# Patient Record
Sex: Male | Born: 1965 | Race: White | Hispanic: No | Smoking: Former smoker
Health system: Southern US, Community
[De-identification: ages and names within clinical notes are randomized; demographics above are authoritative.]

## PROBLEM LIST (undated history)

## (undated) DIAGNOSIS — D571 Sickle-cell disease without crisis: Secondary | ICD-10-CM

## (undated) DIAGNOSIS — K3189 Other diseases of stomach and duodenum: Secondary | ICD-10-CM

## (undated) DIAGNOSIS — J189 Pneumonia, unspecified organism: Secondary | ICD-10-CM

## (undated) DIAGNOSIS — F32A Depression, unspecified: Secondary | ICD-10-CM

## (undated) DIAGNOSIS — C9000 Multiple myeloma not having achieved remission: Secondary | ICD-10-CM

## (undated) DIAGNOSIS — F1020 Alcohol dependence, uncomplicated: Secondary | ICD-10-CM

## (undated) DIAGNOSIS — E119 Type 2 diabetes mellitus without complications: Secondary | ICD-10-CM

## (undated) DIAGNOSIS — N289 Disorder of kidney and ureter, unspecified: Secondary | ICD-10-CM

## (undated) DIAGNOSIS — F329 Major depressive disorder, single episode, unspecified: Secondary | ICD-10-CM

## (undated) DIAGNOSIS — R0600 Dyspnea, unspecified: Secondary | ICD-10-CM

## (undated) DIAGNOSIS — J9690 Respiratory failure, unspecified, unspecified whether with hypoxia or hypercapnia: Secondary | ICD-10-CM

## (undated) DIAGNOSIS — I499 Cardiac arrhythmia, unspecified: Secondary | ICD-10-CM

## (undated) DIAGNOSIS — K631 Perforation of intestine (nontraumatic): Secondary | ICD-10-CM

## (undated) DIAGNOSIS — Z765 Malingerer [conscious simulation]: Secondary | ICD-10-CM

## (undated) DIAGNOSIS — A4902 Methicillin resistant Staphylococcus aureus infection, unspecified site: Secondary | ICD-10-CM

## (undated) DIAGNOSIS — N19 Unspecified kidney failure: Secondary | ICD-10-CM

## (undated) DIAGNOSIS — I10 Essential (primary) hypertension: Secondary | ICD-10-CM

## (undated) DIAGNOSIS — G4730 Sleep apnea, unspecified: Secondary | ICD-10-CM

## (undated) DIAGNOSIS — Z87442 Personal history of urinary calculi: Secondary | ICD-10-CM

## (undated) DIAGNOSIS — I509 Heart failure, unspecified: Secondary | ICD-10-CM

## (undated) HISTORY — PX: ILEOSTOMY: SHX1783

## (undated) HISTORY — PX: HERNIA REPAIR: SHX51

## (undated) HISTORY — PX: CARDIAC SURGERY: SHX584

## (undated) HISTORY — PX: APPENDECTOMY: SHX54

## (undated) HISTORY — PX: ABDOMINAL SURGERY: SHX537

## (undated) HISTORY — PX: PERIPHERALLY INSERTED CENTRAL CATHETER INSERTION: SHX2221

## (undated) HISTORY — DX: Other diseases of stomach and duodenum: K31.89

---

## 2008-07-13 ENCOUNTER — Emergency Department (HOSPITAL_COMMUNITY): Admission: EM | Admit: 2008-07-13 | Discharge: 2008-07-14 | Payer: Self-pay | Admitting: Emergency Medicine

## 2008-09-20 ENCOUNTER — Emergency Department (HOSPITAL_COMMUNITY): Admission: EM | Admit: 2008-09-20 | Discharge: 2008-09-20 | Payer: Self-pay | Admitting: Emergency Medicine

## 2009-07-30 ENCOUNTER — Emergency Department (HOSPITAL_COMMUNITY): Admission: EM | Admit: 2009-07-30 | Discharge: 2009-07-30 | Payer: Self-pay | Admitting: Family Medicine

## 2009-10-31 ENCOUNTER — Emergency Department (HOSPITAL_COMMUNITY): Admission: EM | Admit: 2009-10-31 | Discharge: 2009-10-31 | Payer: Self-pay | Admitting: Emergency Medicine

## 2010-08-14 LAB — URINALYSIS, ROUTINE W REFLEX MICROSCOPIC
Protein, ur: NEGATIVE mg/dL
Urobilinogen, UA: 0.2 mg/dL (ref 0.0–1.0)

## 2010-08-14 LAB — RAPID URINE DRUG SCREEN, HOSP PERFORMED: Barbiturates: NOT DETECTED

## 2010-09-06 LAB — POCT I-STAT, CHEM 8
Calcium, Ion: 1.06 mmol/L — ABNORMAL LOW (ref 1.12–1.32)
Chloride: 110 mEq/L (ref 96–112)
Glucose, Bld: 106 mg/dL — ABNORMAL HIGH (ref 70–99)
HCT: 51 % (ref 39.0–52.0)

## 2010-09-06 LAB — CBC
HCT: 48.7 % (ref 39.0–52.0)
Hemoglobin: 17 g/dL (ref 13.0–17.0)
MCHC: 34.9 g/dL (ref 30.0–36.0)
MCV: 95.5 fL (ref 78.0–100.0)
RBC: 5.1 MIL/uL (ref 4.22–5.81)

## 2010-09-06 LAB — DIFFERENTIAL
Basophils Relative: 0 % (ref 0–1)
Eosinophils Absolute: 0.1 10*3/uL (ref 0.0–0.7)
Monocytes Absolute: 0.9 10*3/uL (ref 0.1–1.0)
Monocytes Relative: 8 % (ref 3–12)

## 2010-09-12 LAB — POCT I-STAT, CHEM 8
BUN: 6 mg/dL (ref 6–23)
Chloride: 104 mEq/L (ref 96–112)
HCT: 43 % (ref 39.0–52.0)
Potassium: 3.3 mEq/L — ABNORMAL LOW (ref 3.5–5.1)

## 2010-09-12 LAB — DIFFERENTIAL
Basophils Absolute: 0 10*3/uL (ref 0.0–0.1)
Lymphocytes Relative: 21 % (ref 12–46)
Lymphs Abs: 1.8 10*3/uL (ref 0.7–4.0)
Neutro Abs: 5.7 10*3/uL (ref 1.7–7.7)

## 2010-09-12 LAB — ETHANOL: Alcohol, Ethyl (B): 344 mg/dL — ABNORMAL HIGH (ref 0–10)

## 2010-09-12 LAB — SALICYLATE LEVEL: Salicylate Lvl: <4 mg/dL (ref 2.8–20.0)

## 2010-09-12 LAB — CBC
HCT: 38.9 % — ABNORMAL LOW (ref 39.0–52.0)
Hemoglobin: 13.9 g/dL (ref 13.0–17.0)
Platelets: 269 10*3/uL (ref 150–400)
RDW: 13.3 % (ref 11.5–15.5)
WBC: 8.4 10*3/uL (ref 4.0–10.5)

## 2010-09-12 LAB — RAPID URINE DRUG SCREEN, HOSP PERFORMED
Amphetamines: NOT DETECTED
Barbiturates: NOT DETECTED
Benzodiazepines: NOT DETECTED
Cocaine: NOT DETECTED
Opiates: NOT DETECTED

## 2010-10-14 ENCOUNTER — Inpatient Hospital Stay (HOSPITAL_COMMUNITY)
Admission: EM | Admit: 2010-10-14 | Discharge: 2010-10-20 | DRG: 392 | Disposition: A | Discharge status: Home / Self Care | Payer: Self-pay | Attending: General Surgery | Admitting: General Surgery

## 2010-10-14 ENCOUNTER — Emergency Department (HOSPITAL_COMMUNITY): Payer: Self-pay

## 2010-10-14 DIAGNOSIS — F3289 Other specified depressive episodes: Secondary | ICD-10-CM | POA: Diagnosis present

## 2010-10-14 DIAGNOSIS — F329 Major depressive disorder, single episode, unspecified: Secondary | ICD-10-CM | POA: Diagnosis present

## 2010-10-14 DIAGNOSIS — F17200 Nicotine dependence, unspecified, uncomplicated: Secondary | ICD-10-CM | POA: Diagnosis present

## 2010-10-14 DIAGNOSIS — J45909 Unspecified asthma, uncomplicated: Secondary | ICD-10-CM | POA: Diagnosis present

## 2010-10-14 DIAGNOSIS — K630 Abscess of intestine: Secondary | ICD-10-CM | POA: Diagnosis present

## 2010-10-14 DIAGNOSIS — F1010 Alcohol abuse, uncomplicated: Secondary | ICD-10-CM | POA: Diagnosis present

## 2010-10-14 DIAGNOSIS — K5732 Diverticulitis of large intestine without perforation or abscess without bleeding: Principal | ICD-10-CM | POA: Diagnosis present

## 2010-10-14 LAB — POCT I-STAT, CHEM 8
BUN: 5 mg/dL — ABNORMAL LOW (ref 6–23)
Calcium, Ion: 1.12 mmol/L (ref 1.12–1.32)
Hemoglobin: 12.9 g/dL — ABNORMAL LOW (ref 13.0–17.0)
TCO2: 27 mmol/L (ref 0–100)

## 2010-10-14 LAB — DIFFERENTIAL
Eosinophils Absolute: 0.1 10*3/uL (ref 0.0–0.7)
Eosinophils Relative: 1 % (ref 0–5)
Lymphs Abs: 1.3 10*3/uL (ref 0.7–4.0)
Monocytes Absolute: 0.9 10*3/uL (ref 0.1–1.0)
Monocytes Relative: 7 % (ref 3–12)

## 2010-10-14 LAB — PROTIME-INR
INR: 1.11 (ref 0.00–1.49)
Prothrombin Time: 14.5 seconds (ref 11.6–15.2)

## 2010-10-14 LAB — CBC
MCH: 33.9 pg (ref 26.0–34.0)
MCHC: 35.8 g/dL (ref 30.0–36.0)
MCV: 94.8 fL (ref 78.0–100.0)
Platelets: 417 10*3/uL — ABNORMAL HIGH (ref 150–400)
RBC: 4.22 MIL/uL (ref 4.22–5.81)
RDW: 12.9 % (ref 11.5–15.5)

## 2010-10-14 LAB — ETHANOL: Alcohol, Ethyl (B): <11 mg/dL — ABNORMAL HIGH (ref 0–10)

## 2010-10-14 LAB — ABO/RH: ABO/RH(D): A POS

## 2010-10-14 MED ORDER — IOHEXOL 300 MG/ML  SOLN
100.0000 mL | Freq: Once | INTRAMUSCULAR | Status: AC | PRN
  Administered 2010-10-14: 100 mL via INTRAVENOUS

## 2010-10-15 LAB — DIFFERENTIAL
Basophils Relative: 1 % (ref 0–1)
Eosinophils Absolute: 0.1 10*3/uL (ref 0.0–0.7)
Lymphs Abs: 1.2 10*3/uL (ref 0.7–4.0)
Neutrophils Relative %: 77 % (ref 43–77)

## 2010-10-15 LAB — CBC
HCT: 37.8 % — ABNORMAL LOW (ref 39.0–52.0)
Hemoglobin: 12.9 g/dL — ABNORMAL LOW (ref 13.0–17.0)
RBC: 3.93 MIL/uL — ABNORMAL LOW (ref 4.22–5.81)
WBC: 8.9 10*3/uL (ref 4.0–10.5)

## 2010-10-15 LAB — BASIC METABOLIC PANEL
CO2: 25 mEq/L (ref 19–32)
Chloride: 103 mEq/L (ref 96–112)
GFR calc non Af Amer: >60 mL/min (ref >60)
Glucose, Bld: 112 mg/dL — ABNORMAL HIGH (ref 70–99)
Potassium: 4.1 mEq/L (ref 3.5–5.1)
Sodium: 137 mEq/L (ref 135–145)

## 2010-10-16 LAB — CBC
HCT: 37.4 % — ABNORMAL LOW (ref 39.0–52.0)
MCV: 95.9 fL (ref 78.0–100.0)
RDW: 13 % (ref 11.5–15.5)
WBC: 7.2 10*3/uL (ref 4.0–10.5)

## 2010-10-16 LAB — DIFFERENTIAL
Eosinophils Relative: 4 % (ref 0–5)
Lymphocytes Relative: 17 % (ref 12–46)
Lymphs Abs: 1.2 10*3/uL (ref 0.7–4.0)
Monocytes Absolute: 0.6 10*3/uL (ref 0.1–1.0)
Monocytes Relative: 9 % (ref 3–12)

## 2010-10-16 LAB — URINALYSIS, ROUTINE W REFLEX MICROSCOPIC
Glucose, UA: NEGATIVE mg/dL
Ketones, ur: 15 mg/dL — AB
pH: 5.5 (ref 5.0–8.0)

## 2010-10-16 LAB — BASIC METABOLIC PANEL
BUN: 7 mg/dL (ref 6–23)
GFR calc non Af Amer: >60 mL/min (ref >60)
Glucose, Bld: 99 mg/dL (ref 70–99)
Potassium: 4 mEq/L (ref 3.5–5.1)

## 2010-10-16 NOTE — H&P (Signed)
  NAMEREDMOND, WHITTLEY               ACCOUNT NO.:  [id number]  MEDICAL RECORD NO.:  [id number]           PATIENT TYPE:  I  LOCATION:  5153                         FACILITY:  [facility]  PHYSICIAN:  Ollen Gross. Vernell Morgans, M.D. DATE OF BIRTH:  [birth year]  DATE OF ADMISSION:  10/14/2010 DATE OF DISCHARGE:                             HISTORY & PHYSICAL   HISTORY OF PRESENT ILLNESS:  Mr. Rinn is a 45 year old white male who presents to the emergency department with left lower quadrant pain for the last 2 weeks.  He says he has been running low grade fevers at home, he has had some off and on diarrhea, his stools have been sort of a blackish color.  He otherwise denies any nausea, vomiting, fevers, chills, chest pain, shortness of breath or dysuria.  PAST MEDICAL HISTORY:  Significant for: 1. Asthma. 2. Depression. 3. Alcohol abuse.  PAST SURGICAL HISTORY:  None.  MEDICATIONS:  None.  ALLERGIES:  None.  SOCIAL HISTORY:  He currently abuses alcohol and smokes tobacco, smoke cigarettes.  FAMILY HISTORY:  Noncontributory.  PHYSICAL EXAMINATION:  VITAL SIGNS:  His temperature is 98.4, pulse 108, blood pressure 120/89. GENERAL:  He is a well-developed, well-nourished white male in no acute distress. SKIN:  Warm and dry.  No jaundice. EYES:  Extraocular muscles intact.  Pupils equal, round and reactive to light.  Sclerae nonicteric. LUNGS:  Clear bilaterally.  No use of accessory or respiratory muscles. HEART:  Regular rate and rhythm with impulse in left chest. ABDOMEN:  Soft, is focally moderately tender in the left lower quadrant with some guarding but the rest of his abdomen is soft and nontender. EXTREMITIES:  No cyanosis, clubbing or edema with good strength in his arms and legs. PSYCHOLOGIC:  He is alert and oriented x3 with no sudden anxiety or depression.  On review of his lab work was significant for a white count of 12.6, a CT scan did show sigmoid diverticulitis with a small  abscess.  We will plan to admit him to the hospital for bowel rest and IV antibiotics.  We will do serial exams on his abdomen.  If he improves, then there is a chance we may be able to get him over this without any kind of surgical intervention.  If he does not improve, then he may need surgery during this hospital stay. He is in agreement with this treatment plan.     Ollen Gross. Vernell Morgans, M.D.     PST/MEDQ  D:  10/14/2010  T:  10/15/2010  Job:  [id number]  Electronically Signed by Chevis Pretty III M.D. on 10/16/2010 01:58:19 PM

## 2010-10-17 LAB — BASIC METABOLIC PANEL
Calcium: 9.2 mg/dL (ref 8.4–10.5)
GFR calc Af Amer: >60 mL/min (ref >60)
GFR calc non Af Amer: >60 mL/min (ref >60)
Glucose, Bld: 103 mg/dL — ABNORMAL HIGH (ref 70–99)
Potassium: 4 mEq/L (ref 3.5–5.1)
Sodium: 138 mEq/L (ref 135–145)

## 2010-10-17 LAB — DIFFERENTIAL
Lymphs Abs: 1.2 10*3/uL (ref 0.7–4.0)
Monocytes Relative: 7 % (ref 3–12)
Neutro Abs: 6.4 10*3/uL (ref 1.7–7.7)
Neutrophils Relative %: 75 % (ref 43–77)

## 2010-10-17 LAB — CBC
HCT: 37.9 % — ABNORMAL LOW (ref 39.0–52.0)
Hemoglobin: 13.3 g/dL (ref 13.0–17.0)
MCHC: 35.1 g/dL (ref 30.0–36.0)
Platelets: 408 10*3/uL — ABNORMAL HIGH (ref 150–400)
RBC: 3.98 MIL/uL — ABNORMAL LOW (ref 4.22–5.81)
RDW: 12.7 % (ref 11.5–15.5)

## 2010-10-18 LAB — CBC
HCT: 39.2 % (ref 39.0–52.0)
Platelets: 446 10*3/uL — ABNORMAL HIGH (ref 150–400)
RBC: 4.12 MIL/uL — ABNORMAL LOW (ref 4.22–5.81)
RDW: 12.8 % (ref 11.5–15.5)
WBC: 8.9 10*3/uL (ref 4.0–10.5)

## 2010-10-18 LAB — BASIC METABOLIC PANEL
Chloride: 107 mEq/L (ref 96–112)
GFR calc non Af Amer: >60 mL/min (ref >60)
Glucose, Bld: 117 mg/dL — ABNORMAL HIGH (ref 70–99)
Potassium: 4.1 mEq/L (ref 3.5–5.1)
Sodium: 140 mEq/L (ref 135–145)

## 2010-10-18 LAB — DIFFERENTIAL
Basophils Absolute: 0 10*3/uL (ref 0.0–0.1)
Eosinophils Relative: 4 % (ref 0–5)
Lymphocytes Relative: 16 % (ref 12–46)
Lymphs Abs: 1.4 10*3/uL (ref 0.7–4.0)
Neutrophils Relative %: 73 % (ref 43–77)

## 2010-10-19 LAB — BASIC METABOLIC PANEL
BUN: 3 mg/dL — ABNORMAL LOW (ref 6–23)
CO2: 24 mEq/L (ref 19–32)
Chloride: 106 mEq/L (ref 96–112)
Glucose, Bld: 89 mg/dL (ref 70–99)
Potassium: 3.8 mEq/L (ref 3.5–5.1)

## 2010-10-19 LAB — CBC
HCT: 38.2 % — ABNORMAL LOW (ref 39.0–52.0)
Hemoglobin: 13.5 g/dL (ref 13.0–17.0)
MCV: 95 fL (ref 78.0–100.0)
RBC: 4.02 MIL/uL — ABNORMAL LOW (ref 4.22–5.81)
WBC: 8.9 10*3/uL (ref 4.0–10.5)

## 2010-10-19 LAB — DIFFERENTIAL
Lymphocytes Relative: 16 % (ref 12–46)
Lymphs Abs: 1.5 10*3/uL (ref 0.7–4.0)
Monocytes Relative: 8 % (ref 3–12)
Neutrophils Relative %: 72 % (ref 43–77)

## 2010-10-20 LAB — CBC
MCH: 33.4 pg (ref 26.0–34.0)
MCV: 95.3 fL (ref 78.0–100.0)
Platelets: 426 10*3/uL — ABNORMAL HIGH (ref 150–400)
RBC: 4.22 MIL/uL (ref 4.22–5.81)

## 2010-10-20 LAB — DIFFERENTIAL
Eosinophils Absolute: 0.4 10*3/uL (ref 0.0–0.7)
Eosinophils Relative: 5 % (ref 0–5)
Lymphs Abs: 1.5 10*3/uL (ref 0.7–4.0)
Monocytes Absolute: 0.9 10*3/uL (ref 0.1–1.0)
Monocytes Relative: 10 % (ref 3–12)
Neutrophils Relative %: 67 % (ref 43–77)

## 2010-10-20 LAB — BASIC METABOLIC PANEL
BUN: 5 mg/dL — ABNORMAL LOW (ref 6–23)
Chloride: 105 mEq/L (ref 96–112)
Creatinine, Ser: 1.01 mg/dL (ref 0.4–1.5)

## 2010-11-24 NOTE — Discharge Summary (Signed)
  Lucas Small, Lucas Small               ACCOUNT NO.:  [id number]  MEDICAL RECORD NO.:  [id number]           PATIENT TYPE:  I  LOCATION:  5153                         FACILITY:  [facility]  PHYSICIAN:  Gabrielle Dare. Janee Morn, M.D.DATE OF BIRTH:  May 24, 1966  DATE OF ADMISSION:  10/14/2010 DATE OF DISCHARGE:  10/20/2010                              DISCHARGE SUMMARY   HISTORY OF PRESENT ILLNESS:  Mr. Thielman is a 45 year old gentleman who presented to the emergency department with left lower quadrant pain x2 weeks' duration.  He has been running low-grade fevers, had on and off diarrhea.  He came into the emergency department and upon review of his workup was found to have elevation of his white count 12.6 and a CT scan showing sigmoid diverticulitis with a very small abscess.  After being evaluated by Dr. Carolynne Edouard, the decision was made to admit the patient for IV antibiotics and inpatient management.  SUMMARY OF HOSPITAL COURSE:  The patient was admitted on Oct 14, 2010, he was started on IV Zosyn, placed at bowel rest.  This went on for a few days.  The patient's white blood cell count normalized within the first 24 hours as he continued on IV antibiotics.  He was gradually started on clear liquid diet and advanced to full liquid diet and subsequently to regular diet.  His pain subsided a little bit each day to the point where he is really having no pain as of today.  He has been tolerating regular diet.  His bowels initially loose, but now more formed had normalized and he feels appropriate for discharge home at present time.  DISCHARGE DIAGNOSIS: 1. Acute sigmoid diverticulitis - improving. 2. Mild depression - untreated  DISCHARGE MEDICATIONS:  The patient will be given prescriptions for Cipro 500 mg twice daily, Flagyl 500 mg 3 times daily and Vicodin 5/500 one to two tablets q.6 h. p.r.n. pain.  He is asked to follow up in our clinic in approximately 10-14 days for further follow-up.  He may  need eventual referral to Gastroenterology for colonoscopy or a barium enema for further workup of his diverticular disease.  We will otherwise see him in the office in approximately 10-14 days.     Brayton El, PA-C   ______________________________ Gabrielle Dare. Janee Morn, M.D.    KB/MEDQ  D:  10/20/2010  T:  10/20/2010  Job:  [id number]  Electronically Signed by Brayton El  on 11/20/2010 02:33:20 PM Electronically Signed by Violeta Gelinas M.D. on 11/24/2010 06:29:38 PM

## 2011-08-22 ENCOUNTER — Encounter (HOSPITAL_COMMUNITY): Payer: Self-pay | Admitting: Emergency Medicine

## 2011-08-22 ENCOUNTER — Emergency Department (HOSPITAL_COMMUNITY)
Admission: EM | Admit: 2011-08-22 | Discharge: 2011-08-23 | Disposition: A | Discharge status: Other Acute Inpatient Hospital | Payer: Self-pay | Attending: Emergency Medicine | Admitting: Emergency Medicine

## 2011-08-22 DIAGNOSIS — F29 Unspecified psychosis not due to a substance or known physiological condition: Secondary | ICD-10-CM | POA: Insufficient documentation

## 2011-08-22 DIAGNOSIS — F3289 Other specified depressive episodes: Secondary | ICD-10-CM | POA: Insufficient documentation

## 2011-08-22 DIAGNOSIS — F329 Major depressive disorder, single episode, unspecified: Secondary | ICD-10-CM | POA: Insufficient documentation

## 2011-08-22 DIAGNOSIS — G479 Sleep disorder, unspecified: Secondary | ICD-10-CM | POA: Insufficient documentation

## 2011-08-22 DIAGNOSIS — F17200 Nicotine dependence, unspecified, uncomplicated: Secondary | ICD-10-CM | POA: Insufficient documentation

## 2011-08-22 DIAGNOSIS — F1010 Alcohol abuse, uncomplicated: Secondary | ICD-10-CM | POA: Insufficient documentation

## 2011-08-22 HISTORY — DX: Major depressive disorder, single episode, unspecified: F32.9

## 2011-08-22 HISTORY — DX: Depression, unspecified: F32.A

## 2011-08-22 LAB — CBC
MCH: 35.4 pg — ABNORMAL HIGH (ref 26.0–34.0)
MCV: 98.7 fL (ref 78.0–100.0)
Platelets: 248 10*3/uL (ref 150–400)
RDW: 13.2 % (ref 11.5–15.5)
WBC: 8.2 10*3/uL (ref 4.0–10.5)

## 2011-08-22 LAB — COMPREHENSIVE METABOLIC PANEL
AST: 199 U/L — ABNORMAL HIGH (ref 0–37)
Albumin: 3.9 g/dL (ref 3.5–5.2)
Calcium: 9.2 mg/dL (ref 8.4–10.5)
Chloride: 98 mEq/L (ref 96–112)
Creatinine, Ser: 0.88 mg/dL (ref 0.50–1.35)
Total Protein: 8 g/dL (ref 6.0–8.3)

## 2011-08-22 LAB — RAPID URINE DRUG SCREEN, HOSP PERFORMED: Barbiturates: NOT DETECTED

## 2011-08-22 LAB — DIFFERENTIAL
Basophils Relative: 1 % (ref 0–1)
Lymphs Abs: 2.9 10*3/uL (ref 0.7–4.0)
Monocytes Relative: 8 % (ref 3–12)
Neutro Abs: 3.9 10*3/uL (ref 1.7–7.7)
Neutrophils Relative %: 49 % (ref 43–77)

## 2011-08-22 LAB — ACETAMINOPHEN LEVEL: Acetaminophen (Tylenol), Serum: <15 ug/mL (ref 10–30)

## 2011-08-22 MED ORDER — LORAZEPAM 1 MG PO TABS
1.0000 mg | ORAL_TABLET | Freq: Three times a day (TID) | ORAL | Status: DC | PRN
  Administered 2011-08-22 (×2): 1 mg via ORAL
  Filled 2011-08-22 (×2): qty 1

## 2011-08-22 MED ORDER — IBUPROFEN 600 MG PO TABS: 600.0000 mg | ORAL_TABLET | Freq: Three times a day (TID) | ORAL | Status: DC | PRN

## 2011-08-22 MED ORDER — NICOTINE 21 MG/24HR TD PT24
21.0000 mg | MEDICATED_PATCH | Freq: Every day | TRANSDERMAL | Status: DC
  Administered 2011-08-22: 21 mg via TRANSDERMAL
  Filled 2011-08-22: qty 1

## 2011-08-22 MED ORDER — ACETAMINOPHEN 325 MG PO TABS: 650.0000 mg | ORAL_TABLET | ORAL | Status: DC | PRN

## 2011-08-22 NOTE — ED Notes (Signed)
Pt presenting to ed with c/o needing medical clearance. Pt states he feels hopeless and pt states he is depressed. Pt states he last drank alcohol this am. Pt states "I'm totally lost" pt states he feels helpless.

## 2011-08-22 NOTE — ED Notes (Signed)
Dr. Ignacia Palma in to speak with pt

## 2011-08-22 NOTE — ED Notes (Signed)
SW in to speak with pt.

## 2011-08-22 NOTE — ED Provider Notes (Signed)
7:38 PM Results for orders placed during the hospital encounter of 08/22/11  CBC      Component Value Range   WBC 8.2  4.0 - 10.5 (K/uL)   RBC 4.72  4.22 - 5.81 (MIL/uL)   Hemoglobin 16.7  13.0 - 17.0 (g/dL)   HCT 16.1  09.6 - 04.5 (%)   MCV 98.7  78.0 - 100.0 (fL)   MCH 35.4 (*) 26.0 - 34.0 (pg)   MCHC 35.8  30.0 - 36.0 (g/dL)   RDW 40.9  81.1 - 91.4 (%)   Platelets 248  150 - 400 (K/uL)  COMPREHENSIVE METABOLIC PANEL      Component Value Range   Sodium 136  135 - 145 (mEq/L)   Potassium 3.2 (*) 3.5 - 5.1 (mEq/L)   Chloride 98  96 - 112 (mEq/L)   CO2 23  19 - 32 (mEq/L)   Glucose, Bld 126 (*) 70 - 99 (mg/dL)   BUN 5 (*) 6 - 23 (mg/dL)   Creatinine, Ser 7.82  0.50 - 1.35 (mg/dL)   Calcium 9.2  8.4 - 95.6 (mg/dL)   Total Protein 8.0  6.0 - 8.3 (g/dL)   Albumin 3.9  3.5 - 5.2 (g/dL)   AST 213 (*) 0 - 37 (U/L)   ALT 180 (*) 0 - 53 (U/L)   Alkaline Phosphatase 116  39 - 117 (U/L)   Total Bilirubin 0.5  0.3 - 1.2 (mg/dL)   GFR calc non Af Amer >90  >90 (mL/min)   GFR calc Af Amer >90  >90 (mL/min)  ETHANOL      Component Value Range   Alcohol, Ethyl (B) 335 (*) 0 - 11 (mg/dL)  ACETAMINOPHEN LEVEL      Component Value Range   Acetaminophen (Tylenol), Serum <15.0  10 - 30 (ug/mL)  URINE RAPID DRUG SCREEN (HOSP PERFORMED)      Component Value Range   Opiates POSITIVE (*) NONE DETECTED    Cocaine NONE DETECTED  NONE DETECTED    Benzodiazepines NONE DETECTED  NONE DETECTED    Amphetamines NONE DETECTED  NONE DETECTED    Tetrahydrocannabinol NONE DETECTED  NONE DETECTED    Barbiturates NONE DETECTED  NONE DETECTED   DIFFERENTIAL      Component Value Range   Neutrophils Relative 49  43 - 77 (%)   Neutro Abs 3.9  1.7 - 7.7 (K/uL)   Lymphocytes Relative 37  12 - 46 (%)   Lymphs Abs 2.9  0.7 - 4.0 (K/uL)   Monocytes Relative 8  3 - 12 (%)   Monocytes Absolute 0.6  0.1 - 1.0 (K/uL)   Eosinophils Relative 5  0 - 5 (%)   Eosinophils Absolute 0.4  0.0 - 0.7 (K/uL)   Basophils  Relative 1  0 - 1 (%)   Basophils Absolute 0.1  0.0 - 0.1 (K/uL)   No results found.  Lab tests show ETOH quite high at 335.  He will need time to sober up before [facility] can evaluate him.  UDS was positive for opiates.  He says a "friend" gave him some hydrocodone-acetaminophen.   Carleene Cooper III, MD 08/22/11 [phone]

## 2011-08-22 NOTE — BH Assessment (Signed)
Assessment Note   Lucas Small is an 46 y.o. male. Pt reported to the [facility] with a chief complaint of depression and requesting detox from alcohol. Pt states that he has been drinking heavily for 28 years and has never engaged in detox before. Pt states that his alcohol has increased over over the passed 2 years and changed from beer to liquor. Pt states that he has been drinking 1/2 gallon of liquor (whiskey or vodka) daily for 2 years. Pt's last use was: 08/21/11 in the amount of 6 shots of liquor. Pt's current withdrawal symptoms include: weakness, anxiety, tingling, tremors, hot/cold flashes and sweats. Pt also reports that he took 2 hydrocodone pills (dosage unknown) on 08/21/11 that his friend gave him for pain, though states he does not use the medication normally.   Pt states that he has been depressed for the last 2 weeks due to an argument with his girlfriend. Pt states that it was a verbal argument however the police were called and he was taken to jail for the night because alcohol was involved. Pt states he is confused and depressed because he "does not know where he stands with his girl friend" and if he can continue to live with her. Pt reports that his alcohol use has been increased due to the depression. Pt also reports sexual dysfunctions noted recently, stating that he has been unable to ejaculate during intercourse over the past few weeks. Pt states that he has also noted some "possible hallucinations", stating that at times he "thinks he sees things out of the corner of his eyes."  Pt is currently denying SI/HI/AH. Pt requires inpatient detox services at this time.   Axis I: Alcohol Dependence; Mood Disorder NOS Axis II: Deferred Axis III:  Past Medical History  Diagnosis Date  . Depression    Axis IV: economic problems, other psychosocial or environmental problems and problems with primary support group Axis V: 51-60 moderate symptoms  Past Medical History:  Past Medical  History  Diagnosis Date  . Depression     History reviewed. No pertinent past surgical history.  Family History: No family history on file.  Social History:  reports that he has been smoking Cigarettes.  He has been smoking about 1.5 packs per day. He does not have any smokeless tobacco history on file. He reports that he drinks alcohol. He reports that he does not use illicit drugs.  Additional Social History:    Allergies:  Allergies  Allergen Reactions  . Bee Anaphylaxis    Home Medications:  Medications Prior to Admission  Medication Dose Route Frequency Provider Last Rate Last Dose  . acetaminophen (TYLENOL) tablet 650 mg  650 mg Oral Q4H PRN Cheri Guppy, MD      . ibuprofen (ADVIL,MOTRIN) tablet 600 mg  600 mg Oral Q8H PRN Cheri Guppy, MD      . LORazepam (ATIVAN) tablet 1 mg  1 mg Oral Q8H PRN Cheri Guppy, MD   1 mg at 08/22/11 2212  . nicotine (NICODERM CQ - dosed in mg/24 hours) patch 21 mg  21 mg Transdermal Daily Cheri Guppy, MD   21 mg at 08/22/11 1205   No current outpatient prescriptions on file as of 08/22/2011.    OB/GYN Status:  No LMP for male patient.  General Assessment Data Location of Assessment: [facility] ED Living Arrangements: Spouse/significant other Can pt return to current living arrangement?: Yes Admission Status: Voluntary Is patient capable of signing voluntary admission?: Yes Transfer from: Acute  Hospital Referral Source: Self/Family/Friend  Education Status Is patient currently in school?: No  Risk to self Suicidal Ideation: No Suicidal Intent: No Is patient at risk for suicide?: No Suicidal Plan?: No Access to Means: No What has been your use of drugs/alcohol within the last 12 months?: ETOH: 1/2 gallon liquor daily for 2 years Previous Attempts/Gestures: No How many times?: 0  Other Self Harm Risks: none reported Triggers for Past Attempts: None known Intentional Self Injurious Behavior: None Family Suicide  History: No Recent stressful life event(s): Job Loss;Conflict (Comment) (lost job 1 1/2 yrs. ago,argument w/ girlfriend 2weeks ago) Persecutory voices/beliefs?: No Depression: Yes Depression Symptoms: Insomnia;Tearfulness;Isolating;Fatigue;Loss of interest in usual pleasures;Feeling worthless/self pity Substance abuse history and/or treatment for substance abuse?: Yes Suicide prevention information given to non-admitted patients: Not applicable  Risk to Others Homicidal Ideation: No Thoughts of Harm to Others: No Current Homicidal Intent: No Current Homicidal Plan: No Access to Homicidal Means: No Identified Victim: none reported History of harm to others?: No Assessment of Violence: None Noted Violent Behavior Description: pt was calm and cooperative during assessment Does patient have access to weapons?: No Criminal Charges Pending?: No Does patient have a court date: No  Psychosis Hallucinations: Visual ("sometimes I see things out of the corner of my eye") Delusions: None noted  Mental Status Report Appear/Hygiene: Disheveled Eye Contact: Fair Motor Activity: Freedom of movement Speech: Logical/coherent Level of Consciousness: Quiet/awake;Alert Mood: Anxious;Depressed Affect: Appropriate to circumstance;Anxious;Depressed Anxiety Level: Moderate Thought Processes: Coherent;Relevant Judgement: Unimpaired Orientation: Person;Place;Time;Situation Obsessive Compulsive Thoughts/Behaviors: None  Cognitive Functioning Concentration: Normal Memory: Recent Intact;Remote Intact IQ: Average Insight: Fair Impulse Control: Poor Appetite: Poor Weight Loss: 0  Weight Gain: 0  Sleep: Decreased Total Hours of Sleep: 0  (no sleep in 2 days) Vegetative Symptoms: None  Prior Inpatient Therapy Prior Inpatient Therapy: No Prior Therapy Dates: none reported Prior Therapy Facilty/Provider(s): none reported Reason for Treatment: n/a  Prior Outpatient Therapy Prior Outpatient  Therapy: No Prior Therapy Dates: none reported Prior Therapy Facilty/Provider(s): none reported Reason for Treatment: n/a                     Additional Information 1:1 In Past 12 Months?: No CIRT Risk: No Elopement Risk: No Does patient have medical clearance?: Yes     Disposition:  Disposition Disposition of Patient: Referred to;Inpatient treatment program [facility]) Type of inpatient treatment program: Adult Patient referred to: Other (Comment) [facility])  On Site Evaluation by:   Reviewed with Physician:     [place] Crane F 08/22/2011 10:20 PM

## 2011-08-22 NOTE — ED Provider Notes (Signed)
History     CSN: [id number]  Arrival date & time 08/22/11  4098   First MD Initiated Contact with Patient 08/22/11 [phone]      Chief Complaint  Patient presents with  . Medical Clearance    (Consider location/radiation/quality/duration/timing/severity/associated sxs/prior treatment) The history is provided by the patient and a friend.   the patient is a 46 year old alcoholic with depression, who presents to the emergency department requesting treatment for alcoholism and depression.  He denies suicidal or homicidal thoughts.  He has no physical symptoms.  He states that he recently broke up with his girlfriend and he is drinking more.  He says that he cannot concentrate and he does not sleep well.  He thinks that he is increasingly confused.  He drinks alcohol, which he did this morning and has had hydrocodone as well.  He denies illicit drug use  Past Medical History  Diagnosis Date  . Depression     History reviewed. No pertinent past surgical history.  No family history on file.  History  Substance Use Topics  . Smoking status: Current Everyday Smoker -- 1.5 packs/day    Types: Cigarettes  . Smokeless tobacco: Not on file  . Alcohol Use: Yes     1/2 gallon of liquor daily      Review of Systems  Neurological: Negative for headaches.  Psychiatric/Behavioral: Positive for confusion, sleep disturbance and dysphoric mood. Negative for suicidal ideas, hallucinations and self-injury.  All other systems reviewed and are negative.    Allergies  Bee  Home Medications  No current outpatient prescriptions on file.  BP 130/91  Pulse 96  Temp(Src) 97.5 F (36.4 C) (Oral)  Resp 20  SpO2 95%  Physical Exam  Vitals reviewed. Constitutional: He is oriented to person, place, and time. He appears well-developed and well-nourished. No distress.  HENT:  Head: Normocephalic and atraumatic.  Eyes: EOM are normal. Pupils are equal, round, and reactive to light.  Neck: Normal  range of motion. Neck supple.  Cardiovascular: Normal rate, regular rhythm and normal heart sounds.   No murmur heard. Pulmonary/Chest: Effort normal and breath sounds normal. No respiratory distress. He has no wheezes. He has no rales.  Abdominal: Soft. Bowel sounds are normal. He exhibits no distension and no mass. There is no tenderness. There is no rebound and no guarding.  Musculoskeletal: Normal range of motion. He exhibits no edema and no tenderness.  Neurological: He is alert and oriented to person, place, and time. No cranial nerve deficit.  Skin: Skin is warm and dry. He is not diaphoretic.  Psychiatric: He has a normal mood and affect. His behavior is normal.    ED Course  Procedures (including critical care time)  Labs Reviewed  CBC - Abnormal; Notable for the following:    MCH 35.4 (*)    All other components within normal limits  COMPREHENSIVE METABOLIC PANEL  ETHANOL  ACETAMINOPHEN LEVEL  URINE RAPID DRUG SCREEN (HOSP PERFORMED)  DIFFERENTIAL   No results found.   No diagnosis found.    MDM  Alcohol abuse        Cheri Guppy, MD 08/24/11 [phone]

## 2011-08-22 NOTE — ED Notes (Signed)
Pt states he is here for alcohol detox and also depression.  Says he has been depressed for some time but is unable to answer any questions.  States that this nurse is "asking all the wrong questions"  Denies SI/HI.

## 2011-08-23 ENCOUNTER — Inpatient Hospital Stay (HOSPITAL_COMMUNITY)
Admission: AD | Admit: 2011-08-23 | Discharge: 2011-08-30 | DRG: 897 | Disposition: A | Discharge status: Home / Self Care | Payer: PRIVATE HEALTH INSURANCE | Source: Ambulatory Visit | Attending: Psychiatry | Admitting: Psychiatry

## 2011-08-23 ENCOUNTER — Encounter (HOSPITAL_COMMUNITY): Payer: Self-pay

## 2011-08-23 DIAGNOSIS — F3289 Other specified depressive episodes: Secondary | ICD-10-CM

## 2011-08-23 DIAGNOSIS — F17200 Nicotine dependence, unspecified, uncomplicated: Secondary | ICD-10-CM

## 2011-08-23 DIAGNOSIS — F1994 Other psychoactive substance use, unspecified with psychoactive substance-induced mood disorder: Secondary | ICD-10-CM

## 2011-08-23 DIAGNOSIS — Z818 Family history of other mental and behavioral disorders: Secondary | ICD-10-CM

## 2011-08-23 DIAGNOSIS — I10 Essential (primary) hypertension: Secondary | ICD-10-CM

## 2011-08-23 DIAGNOSIS — F329 Major depressive disorder, single episode, unspecified: Secondary | ICD-10-CM

## 2011-08-23 DIAGNOSIS — R0683 Snoring: Secondary | ICD-10-CM | POA: Diagnosis present

## 2011-08-23 DIAGNOSIS — F1020 Alcohol dependence, uncomplicated: Principal | ICD-10-CM

## 2011-08-23 DIAGNOSIS — Z91038 Other insect allergy status: Secondary | ICD-10-CM

## 2011-08-23 DIAGNOSIS — Z87892 Personal history of anaphylaxis: Secondary | ICD-10-CM

## 2011-08-23 MED ORDER — CHLORDIAZEPOXIDE HCL 25 MG PO CAPS
25.0000 mg | ORAL_CAPSULE | Freq: Three times a day (TID) | ORAL | Status: AC
  Administered 2011-08-24 (×3): 25 mg via ORAL
  Filled 2011-08-23 (×3): qty 1

## 2011-08-23 MED ORDER — ONDANSETRON 4 MG PO TBDP: 4.0000 mg | ORAL_TABLET | Freq: Four times a day (QID) | ORAL | Status: AC | PRN

## 2011-08-23 MED ORDER — ADULT MULTIVITAMIN W/MINERALS CH
1.0000 | ORAL_TABLET | Freq: Every day | ORAL | Status: DC
  Administered 2011-08-23 – 2011-08-30 (×8): 1 via ORAL
  Filled 2011-08-23 (×9): qty 1

## 2011-08-23 MED ORDER — CHLORDIAZEPOXIDE HCL 25 MG PO CAPS
25.0000 mg | ORAL_CAPSULE | Freq: Four times a day (QID) | ORAL | Status: AC
  Administered 2011-08-23 (×4): 25 mg via ORAL
  Filled 2011-08-23 (×4): qty 1

## 2011-08-23 MED ORDER — HYDROXYZINE HCL 25 MG PO TABS
25.0000 mg | ORAL_TABLET | Freq: Four times a day (QID) | ORAL | Status: AC | PRN
  Administered 2011-08-23: 25 mg via ORAL

## 2011-08-23 MED ORDER — CHLORDIAZEPOXIDE HCL 25 MG PO CAPS: 25.0000 mg | ORAL_CAPSULE | Freq: Four times a day (QID) | ORAL | Status: AC | PRN

## 2011-08-23 MED ORDER — NICOTINE 21 MG/24HR TD PT24
MEDICATED_PATCH | TRANSDERMAL | Status: AC
  Filled 2011-08-23: qty 1

## 2011-08-23 MED ORDER — VITAMIN B-1 100 MG PO TABS
100.0000 mg | ORAL_TABLET | Freq: Every day | ORAL | Status: DC
  Administered 2011-08-24 – 2011-08-30 (×7): 100 mg via ORAL
  Filled 2011-08-23 (×8): qty 1

## 2011-08-23 MED ORDER — CITALOPRAM HYDROBROMIDE 20 MG PO TABS
20.0000 mg | ORAL_TABLET | Freq: Every day | ORAL | Status: DC
  Administered 2011-08-23: 40 mg via ORAL
  Administered 2011-08-24 – 2011-08-30 (×7): 20 mg via ORAL
  Filled 2011-08-23 (×9): qty 1

## 2011-08-23 MED ORDER — POTASSIUM CHLORIDE CRYS ER 20 MEQ PO TBCR
20.0000 meq | EXTENDED_RELEASE_TABLET | Freq: Two times a day (BID) | ORAL | Status: AC
  Administered 2011-08-23 – 2011-08-26 (×6): 20 meq via ORAL
  Filled 2011-08-23 (×7): qty 1

## 2011-08-23 MED ORDER — LISINOPRIL 10 MG PO TABS
10.0000 mg | ORAL_TABLET | Freq: Every day | ORAL | Status: DC
  Administered 2011-08-23 – 2011-08-30 (×8): 10 mg via ORAL
  Filled 2011-08-23 (×8): qty 1
  Filled 2011-08-23: qty 14

## 2011-08-23 MED ORDER — CHLORDIAZEPOXIDE HCL 25 MG PO CAPS
25.0000 mg | ORAL_CAPSULE | ORAL | Status: AC
  Administered 2011-08-25 (×2): 25 mg via ORAL
  Filled 2011-08-23 (×2): qty 1

## 2011-08-23 MED ORDER — LOPERAMIDE HCL 2 MG PO CAPS
2.0000 mg | ORAL_CAPSULE | ORAL | Status: AC | PRN
  Administered 2011-08-23: 4 mg via ORAL
  Filled 2011-08-23: qty 2

## 2011-08-23 MED ORDER — CHLORDIAZEPOXIDE HCL 25 MG PO CAPS
25.0000 mg | ORAL_CAPSULE | Freq: Every day | ORAL | Status: AC
  Administered 2011-08-26: 25 mg via ORAL
  Filled 2011-08-23: qty 1

## 2011-08-23 MED ORDER — THIAMINE HCL 100 MG/ML IJ SOLN
100.0000 mg | Freq: Once | INTRAMUSCULAR | Status: AC
  Administered 2011-08-23: 100 mg via INTRAMUSCULAR

## 2011-08-23 MED ORDER — ALUM & MAG HYDROXIDE-SIMETH 200-200-20 MG/5ML PO SUSP: 30.0000 mL | ORAL | Status: DC | PRN

## 2011-08-23 MED ORDER — CHLORDIAZEPOXIDE HCL 25 MG PO CAPS
25.0000 mg | ORAL_CAPSULE | Freq: Once | ORAL | Status: AC
  Administered 2011-08-23: 25 mg via ORAL
  Filled 2011-08-23: qty 1

## 2011-08-23 MED ORDER — MAGNESIUM HYDROXIDE 400 MG/5ML PO SUSP: 30.0000 mL | Freq: Every day | ORAL | Status: DC | PRN

## 2011-08-23 MED ORDER — TRAZODONE HCL 50 MG PO TABS
50.0000 mg | ORAL_TABLET | Freq: Every day | ORAL | Status: DC
  Administered 2011-08-23 – 2011-08-27 (×5): 50 mg via ORAL
  Filled 2011-08-23 (×7): qty 1

## 2011-08-23 NOTE — BH Assessment (Signed)
Pt has been accepted to [facility] by Dr. Elmon Kirschner to attending Dr. Allena Katz RM [phone]. Support paperwork has been faxed to [facility].       Georgina Quint A 08/23/2011 1:39 AM

## 2011-08-23 NOTE — H&P (Signed)
Psychiatric Admission Assessment Adult  Patient Identification:  Lucas Small  Date of Evaluation:  08/23/2011  Chief Complaint:  Alcohol Dependence; Mood Disorder NOS  History of Present Illness:: This is a 46 year old Caucasian male, admitted to [facility] from the [facility] ED with complaints of increased depression and alcohol abuse/dependency. Patient reports, "I have bad depression. And I noticed that it is getting worse. When I get very depressed, I drink more alcohol. I have been drinking since I was 46 years old. I have also been an alcoholic x 28 years. I used to drink about 12 packs of beer a night. Then I met my girlfriend of 2 years who likes pure liquor. I started to drink with her. So, I went from drinking beer to liquor. I like Vodka and whiskey. I drink a gallon and a pint of pure liquor daily x 2 years. I drink because it makes me feel good and also helps me fall asleep. Sometimes I blackout from drinking too much. Other times, I get the shakes. My drinking has caused me a lot of personal problems and issues. My marriage failed, my wife left me for another guy.  I lost my home and my brand new car. My driver's licence was revoked and I lost my job because I have nothing to drive to work. I have had DUI charges against me, and I have served 60 days in jail because of DWI. I lost my entire life savings because of my alcoholism. Recently, my girlfriend threw me out of the house.  I have not had any substance abuse treatment until now. I have not been in this hospital before either. But I am ready for a change in my life, but I need help doing it. I have had treatment for depression a long time ago. I am not suicidal, homicidal, however, I do hear voices and see stuff moving on the corner of my eyes sometimes, but when I really look hard, there will be nothing there"  Mood Symptoms:  Anhedonia, Guilt, Hopelessness, Mood Swings, Past 2 Weeks, Sadness, Worthlessness,  Depression  Symptoms:  depressed mood, psychomotor agitation, insomnia,  (Hypo) Manic Symptoms:  Irritable Mood,  Anxiety Symptoms:  Excessive Worry,  Psychotic Symptoms:  Hallucinations: Auditory Visual  PTSD Symptoms: Had a traumatic exposure:  None reported  Past Psychiatric History: Diagnosis: Psychoactive Substance-induced organic mood disorder, Alcohol intoxication, alcohol dependency  Hospitalizations: [facility]  Outpatient Care: None reported  Substance Abuse Care: None reported  Self-Mutilation: Patient denies  Suicidal Attempts: Patient denies  Violent Behaviors: Patient denies   Past Medical History:   Past Medical History  Diagnosis Date  . Depression    Loss of Consciousness:  "I get"I get blackouts when I drink too much Seizure History:  "I get the shakes, it can be real bad sometimes" Cardiac History:  Hypertension  Allergies:   Allergies  Allergen Reactions  . Bee Anaphylaxis   PTA Medications: No prescriptions prior to admission    Previous Psychotropic Medications:  Medication/Dose  Hydrocodone 10 mg (I borrow this from my friends.               Substance Abuse History in the last 12 months: Substance Age of 1st Use Last Use Amount Specific Type  Nicotine 21 Prior to hosp 2 packs daily Cigarettes  Alcohol 17 Prior to hosp A gallon and a pint daily Whiskey, Vodka  Cannabis "I stopped smoking weed a long time ago"  Opiates 31 Prior to hosp 2 tablets of hydrocodone Hydrocodone "my girlfriend gave it to me"  Cocaine 45 2 months ago twice Cocaine  Methamphetamines Denies use     LSD Denies use     Ecstasy Denies use     Benzodiazepines Denies use     Caffeine      Inhalants      Others:                         Consequences of Substance Abuse: Medical Consequences:  Liver damage, possible death by overdose,  Legal Consequences:  Arrests, jail times, Loss of driving privileges Family Consequences:  Family discord Blackouts:   Withdrawal Symptoms:    Cramps Diaphoresis Diarrhea Headaches Nausea Tremors  Social History: Current Place of Residence:  [place] of Birth:  [place].  Family Members: "i have a girlfriend"  Marital Status:  Divorced  Children: 1  Sons: 1  Daughters: 0  Relationships:"I have a girlfriend"  Education:  Special educational needs teacher Problems/Performance: "I completed high school"  Religious Beliefs/Practices: None reported  History of Abuse (Emotional/Phsycial/Sexual): None reported  Occupational Experiences: English as a second language teacher History:  None.  Legal History: Hx. DUI, served 60 days in jail for DWI, lost driving privilege.  Hobbies/Interests: None reported  Family History:  Depression: Mother, sister  Mental Status Examination/Evaluation: Objective:  Appearance: Casual  Eye Contact::  Good  Speech:  Clear and Coherent  Volume:  Normal  Mood:  Depressed, Hopeless and Worthless  Affect:  Flat and Tearful  Thought Process:  Coherent and Intact  Orientation:  Full  Thought Content:  Hallucinations: Auditory Visual  Suicidal Thoughts:  No  Homicidal Thoughts:  No  Memory:  Immediate;   Good Recent;   Good Remote;   Good  Judgement:  Poor  Insight:  Fair  Psychomotor Activity:  Normal  Concentration:  Fair  Recall:  Good  Akathisia:  No  Handed:  Right  AIMS (if indicated):     Assets:  Desire for Improvement  Sleep:  Number of Hours: 0.75     Laboratory/X-Ray: None Psychological Evaluation(s)      Assessment:    AXIS I:  Substance Induced Mood Disorder AXIS II:  Deferred AXIS III:   Past Medical History  Diagnosis Date  . Depression    AXIS IV:  economic problems, housing problems, occupational problems, other psychosocial or environmental problems, problems related to legal system/crime, problems related to social environment and problems with primary support group AXIS V:  21-30 behavior considerably influenced by delusions or hallucinations OR  serious impairment in judgment, communication OR inability to function in almost all areas  Treatment Plan/Recommendations: Admit for safety and stabilization.                                                                Review and reinstate any pertinent home medications.  Initiate alcohol withdrawal protocol.                                                                Citalopram 20 mg daily, and K-dur 20 meq bid x 6 doses,                                                                           Lisinopril 10 mg daily.                                                                 Recheck CMP 08/26/11  Treatment Plan Summary: Daily contact with patient to assess and evaluate symptoms and progress in treatment Medication management  Current Medications:  Current Facility-Administered Medications  Medication Dose Route Frequency Provider Last Rate Last Dose  . alum & mag hydroxide-simeth (MAALOX/MYLANTA) 200-200-20 MG/5ML suspension 30 mL  30 mL Oral Q4H PRN Jorje Guild, PA-C      . chlordiazePOXIDE (LIBRIUM) capsule 25 mg  25 mg Oral Q6H PRN Jorje Guild, PA-C      . chlordiazePOXIDE (LIBRIUM) capsule 25 mg  25 mg Oral Once Jorje Guild, PA-C   25 mg at 08/23/11 0429  . chlordiazePOXIDE (LIBRIUM) capsule 25 mg  25 mg Oral QID Jorje Guild, PA-C   25 mg at 08/23/11 0831   Followed by  . chlordiazePOXIDE (LIBRIUM) capsule 25 mg  25 mg Oral TID Jorje Guild, PA-C       Followed by  . chlordiazePOXIDE (LIBRIUM) capsule 25 mg  25 mg Oral BH-qamhs Jorje Guild, PA-C       Followed by  . chlordiazePOXIDE (LIBRIUM) capsule 25 mg  25 mg Oral Daily Jorje Guild, PA-C      . hydrOXYzine (ATARAX/VISTARIL) tablet 25 mg  25 mg Oral Q6H PRN Jorje Guild, PA-C      . loperamide (IMODIUM) capsule 2-4 mg  2-4 mg Oral PRN Jorje Guild, PA-C      . magnesium hydroxide (MILK OF MAGNESIA) suspension 30 mL  30 mL Oral Daily PRN Jorje Guild, PA-C      . mulitivitamin with  minerals tablet 1 tablet  1 tablet Oral Daily Jorje Guild, PA-C   1 tablet at 08/23/11 0831  . nicotine (NICODERM CQ - dosed in mg/24 hours) 21 mg/24hr patch           . ondansetron (ZOFRAN-ODT) disintegrating tablet 4 mg  4 mg Oral Q6H PRN Jorje Guild, PA-C      . thiamine (B-1) injection 100 mg  100 mg Intramuscular Once Jorje Guild, PA-C   100 mg at 08/23/11 0431  . thiamine (VITAMIN B-1) tablet 100 mg  100 mg Oral Daily Jorje Guild, PA-C      . traZODone (DESYREL) tablet 50 mg  50 mg Oral  QHS Jorje Guild, PA-C       Facility-Administered Medications Ordered in Other Encounters  Medication Dose Route Frequency Provider Last Rate Last Dose  . DISCONTD: acetaminophen (TYLENOL) tablet 650 mg  650 mg Oral Q4H PRN Cheri Guppy, MD      . DISCONTD: ibuprofen (ADVIL,MOTRIN) tablet 600 mg  600 mg Oral Q8H PRN Cheri Guppy, MD      . DISCONTD: LORazepam (ATIVAN) tablet 1 mg  1 mg Oral Q8H PRN Cheri Guppy, MD   1 mg at 08/22/11 2212  . DISCONTD: nicotine (NICODERM CQ - dosed in mg/24 hours) patch 21 mg  21 mg Transdermal Daily Cheri Guppy, MD   21 mg at 08/22/11 1205    Observation Level/Precautions:   Q 15 minutes checks for safety  Laboratory:  Reviewed and noted ED lab findings on file.  Psychotherapy:  Group  Medications:  See medication lists.  Routine PRN Medications:  Yes  Consultations:  None indicated at this time  Discharge Concerns:  Safety  Other:     Armandina Stammer I 3/28/201310:28 AM Tressia Miners
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
## 2011-08-23 NOTE — Progress Notes (Signed)
[facility] Group Notes:  (Counselor/Nursing/MHT/Case Management/Adjunct)  08/23/2011 12:40 PM  Type of Therapy:  Group Therapy  Participation Level:  Did Not Attend  Lucas Small 08/23/2011, 12:40 PM

## 2011-08-23 NOTE — H&P (Signed)
Pt seen and evaluated upon admission.  Completed Admission Suicide Risk Assessment.  See orders.  Pt agreeable with plan.  Discussed with team.   

## 2011-08-23 NOTE — Progress Notes (Signed)
Voluntary admission requesting detox.  Gives 28 year history of alcohol abuse, mainly Vodka ans Whiskey.  Occasional use of Oxycodone from friends if no money to purchase Alcohol.  Currently depressed characterized by crying episodes, loss of self esteem, unreliable memory, mild confusion, and pain in both legs and abdominal pain secondary to "diverticulitis."  Sad and dejected mood and affect.  Denies SI, HI, or A/V hallucinations.  CIWA = 8.  Given snack meal and Librium detox protocol begun.

## 2011-08-23 NOTE — Progress Notes (Signed)
Cosigned by Laquan Ludden C Aleina Burgio, LCSWA 3/28/20133:35 PM   

## 2011-08-23 NOTE — Discharge Planning (Signed)
Pt did not attend morning group. Briefly met with pt in room following group. Pt reports that he arrived at the hospital late last night and was very tired. Pt reports that he has been drinking for the past 28 years and this is his first time going through detox. Pt states that he is unemployed and currently lives with friends in the [place] area. Pt states that he would like to go to a 28 day program after d/c.

## 2011-08-23 NOTE — Progress Notes (Signed)
Patient ID: Lucas Small, male   DOB: Jan 29, 1966, 46 y.o.   MRN: [id number] Pt is awake and active on the unit this PM. Pt denies SI/HI and AVH. Pt is attending attending groups and  is cooperative with staff. Pt c/o mild tremors, anxiety and diarrhea. Writer administered medications per orders. Pt also states that he is facing difficult family problems and currently is homeless. Writer provided pt with information on diet management for diverticulitis and offered emotional support. Writer will continue to monitor.

## 2011-08-23 NOTE — Progress Notes (Signed)
Patient ID: Lucas Small, male   DOB: 28-Apr-1966, 46 y.o.   MRN: [id number] He has been in bed sleeping most of day. Up for short periods of time . Has c/o withdrawal symptoms but has not requested and additional medication even when offered.   Depressed  7, hopelessness 8, denies SI thoughts. Says that his attention span was better.Marland Kitchen

## 2011-08-23 NOTE — Progress Notes (Signed)
[facility] Group Notes:  (Counselor/Nursing/MHT/Case Management/Adjunct)  08/23/2011 2:10 PM  Type of Therapy:  1:15PM Group Therapy  Participation Level:  Did Not Attend  Lucas Small 08/23/2011, 2:10 PM

## 2011-08-23 NOTE — Progress Notes (Signed)
Cosigned by Carney Bern, LCSWA 3/28/20133:35 PM

## 2011-08-23 NOTE — Tx Team (Signed)
Initial Interdisciplinary Treatment Plan  PATIENT STRENGTHS: (choose at least two) Average or above average intelligence Communication skills General fund of knowledge  PATIENT STRESSORS: Financial difficulties Health problems Substance abuse   PROBLEM LIST: Problem List/Patient Goals Date to be addressed Date deferred Reason deferred Estimated date of resolution  Depression 08/23/11     Abusive use of substances 08/23/11     Inadequate support system 08/23/11     Lack of self esteem 08/23/11                                    DISCHARGE CRITERIA:  Ability to meet basic life and health needs Adequate post-discharge living arrangements Improved stabilization in mood, thinking, and/or behavior Medical problems require only outpatient monitoring Motivation to continue treatment in a less acute level of care Need for constant or close observation no longer present Verbal commitment to aftercare and medication compliance Withdrawal symptoms are absent or subacute and managed without 24-hour nursing intervention  PRELIMINARY DISCHARGE PLAN: Attend aftercare/continuing care group Attend 12-step recovery group Outpatient therapy Placement in alternative living arrangements  PATIENT/FAMIILY INVOLVEMENT: This treatment plan has been presented to and reviewed with the patient, Lucas Small, and/or family member,   The patient and family have been given the opportunity to ask questions and make suggestions.  Alicia Amel 08/23/2011, 4:47 AM

## 2011-08-24 DIAGNOSIS — F329 Major depressive disorder, single episode, unspecified: Secondary | ICD-10-CM

## 2011-08-24 DIAGNOSIS — F1994 Other psychoactive substance use, unspecified with psychoactive substance-induced mood disorder: Secondary | ICD-10-CM

## 2011-08-24 LAB — COMPREHENSIVE METABOLIC PANEL
ALT: 130 U/L — ABNORMAL HIGH (ref 0–53)
Alkaline Phosphatase: 115 U/L (ref 39–117)
BUN: 12 mg/dL (ref 6–23)
CO2: 29 mEq/L (ref 19–32)
Chloride: 103 mEq/L (ref 96–112)
GFR calc Af Amer: 87 mL/min — ABNORMAL LOW (ref >90)
Glucose, Bld: 100 mg/dL — ABNORMAL HIGH (ref 70–99)
Potassium: 3.6 mEq/L (ref 3.5–5.1)
Sodium: 141 mEq/L (ref 135–145)
Total Bilirubin: 0.6 mg/dL (ref 0.3–1.2)
Total Protein: 8.1 g/dL (ref 6.0–8.3)

## 2011-08-24 MED ORDER — NICOTINE 21 MG/24HR TD PT24
21.0000 mg | MEDICATED_PATCH | Freq: Every day | TRANSDERMAL | Status: DC
  Administered 2011-08-24 – 2011-08-30 (×7): 21 mg via TRANSDERMAL
  Filled 2011-08-24 (×10): qty 1

## 2011-08-24 NOTE — [facility] Suicide Risk Assessment (Signed)
Suicide Risk Assessment  Admission Assessment      Demographic factors:  See chart.  Current Mental Status:  Patient seen and evaluated. Chart reviewed. Patient stated that his mood was "not good". His affect was mood congruent and dysthymic. He denied any current thoughts of self injurious behavior, suicidal ideation or homicidal ideation. There were no auditory or visual hallucinations, paranoia, delusional thought processes, or mania noted.  Thought process was linear and goal directed.  No psychomotor agitation or retardation was noted. His speech was normal rate, tone and volume. Eye contact was good. Judgment and insight are fair.  Patient has been up and engaged on the unit.  No acute safety concerns reported from team.  Loss Factors:  Issues with GF; Divorce; past DUI; unemployed  Historical Factors: No past SA Tx.  Risk Reduction Factors:  Support; open to residential tx s/p detox  CLINICAL FACTORS: Alcohol Dependence & W/D; r/o SIMD  COGNITIVE FEATURES THAT CONTRIBUTE TO RISK: limited insight.  SUICIDE RISK: Pt viewed as a moderate increased risk of harm to self in light of his past hx and risk factors.  No acute safety concerns on the unit.  Pt contracting for safety and in need of crisis stabilization & Tx.  PLAN OF CARE: Pt admitted for crisis stabilization, detox and treatment.  Please see orders.   Medications reviewed with pt and medication education provided.  Started pt on citalopram.  Trazodone for sleep.  Will continue q15 minute checks per unit protocol.  No clinical indication for one on one level of observation at this time.  Pt contracting for safety.  Mental health treatment, medication management and continued sobriety will mitigate against the potential increased risk of harm to self and/or others.  Discussed the importance of recovery with pt, as well as, tools to move forward in a healthy & safe manner.  Pt agreeable with the plan.  Discussed with the team.  Rec  residential Tx s/p detox.  Lupe Carney 08/24/2011, 7:40 AM

## 2011-08-24 NOTE — Progress Notes (Signed)
Pt has been up and has been active while in the milieu today, has been participating in various activities, pt having various signs and symptoms of withdrawal, pt has endorsed feelings of depression but denies any feelings of suicidality, pt has received all medications today without incident, will continue to monitor

## 2011-08-24 NOTE — Progress Notes (Signed)
Sleeping at long intervals.  Exhibiting normal sleep behavior.  No symptoms of acute withdrawal reported or observed.  Q 15 minute safety checks are in progress.
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
## 2011-08-24 NOTE — Progress Notes (Signed)
[facility] MD Progress Note  08/24/2011 2:24 PM  Diagnosis:  Alcohol dependent, opiate abuse,   ADL's:  Intact  Sleep: "nightmares" but slept fairly well on and off.  Appetite:  Fair  Suicidal Ideation:  denies Homicidal Ideation:  denies  Subjective:  Mental Status Examination/Evaluation: Objective:  Appearance: Casual  Eye Contact::  Good  Speech:  Clear and Coherent  Volume:  Normal  Mood:  Depressed  7 out of 10, down from 8/10  Affect:  Congruent  Thought Process:  Coherent  Orientation:  Full  Thought Content:  WDL  Suicidal Thoughts:  No  Homicidal Thoughts:  No  Memory:  Immediate;   Good  Judgement:  Intact  Insight:  Present  Psychomotor Activity:  Normal  Concentration:  Good  Recall:  Good  Akathisia:  No  Handed:    AIMS (if indicated):     Assets:  Communication Skills Desire for Improvement  Sleep:  Number of Hours: 5.75    Vital Signs:Blood pressure 106/73, pulse 79, temperature 97.8 F (36.6 C), temperature source Oral, resp. rate 16, height 5\' 7"  (1.702 m), weight 82.555 kg (182 lb). Current Medications: Current Facility-Administered Medications  Medication Dose Route Frequency Provider Last Rate Last Dose  . alum & mag hydroxide-simeth (MAALOX/MYLANTA) 200-200-20 MG/5ML suspension 30 mL  30 mL Oral Q4H PRN Jorje Guild, PA-C      . chlordiazePOXIDE (LIBRIUM) capsule 25 mg  25 mg Oral Q6H PRN Jorje Guild, PA-C      . chlordiazePOXIDE (LIBRIUM) capsule 25 mg  25 mg Oral QID Jorje Guild, PA-C   25 mg at 08/23/11 2140   Followed by  . chlordiazePOXIDE (LIBRIUM) capsule 25 mg  25 mg Oral TID Jorje Guild, PA-C   25 mg at 08/24/11 1209   Followed by  . chlordiazePOXIDE (LIBRIUM) capsule 25 mg  25 mg Oral BH-qamhs Jorje Guild, PA-C       Followed by  . chlordiazePOXIDE (LIBRIUM) capsule 25 mg  25 mg Oral Daily Jorje Guild, PA-C      . citalopram (CELEXA) tablet 20 mg  20 mg Oral Daily Sanjuana Kava, NP   20 mg at 08/24/11 0809  . hydrOXYzine (ATARAX/VISTARIL) tablet  25 mg  25 mg Oral Q6H PRN Jorje Guild, PA-C   25 mg at 08/23/11 1608  . lisinopril (PRINIVIL,ZESTRIL) tablet 10 mg  10 mg Oral Daily Sanjuana Kava, NP   10 mg at 08/24/11 0809  . loperamide (IMODIUM) capsule 2-4 mg  2-4 mg Oral PRN Jorje Guild, PA-C   4 mg at 08/23/11 1608  . magnesium hydroxide (MILK OF MAGNESIA) suspension 30 mL  30 mL Oral Daily PRN Jorje Guild, PA-C      . mulitivitamin with minerals tablet 1 tablet  1 tablet Oral Daily Jorje Guild, PA-C   1 tablet at 08/24/11 0809  . nicotine (NICODERM CQ - dosed in mg/24 hours) 21 mg/24hr patch           . nicotine (NICODERM CQ - dosed in mg/24 hours) patch 21 mg  21 mg Transdermal Daily Alyson Kuroski-Mazzei, DO   21 mg at 08/24/11 1308  . ondansetron (ZOFRAN-ODT) disintegrating tablet 4 mg  4 mg Oral Q6H PRN Jorje Guild, PA-C      . potassium chloride SA (K-DUR,KLOR-CON) CR tablet 20 mEq  20 mEq Oral BID Sanjuana Kava, NP   20 mEq at 08/24/11 0809  . thiamine (VITAMIN B-1) tablet 100 mg  100 mg Oral Daily Jorje Guild,  PA-C   100 mg at 08/24/11 0809  . traZODone (DESYREL) tablet 50 mg  50 mg Oral QHS Jorje Guild, PA-C   50 mg at 08/23/11 2140    Lab Results:    Physical Findings: AIMS:  , ,  ,  ,    CIWA:  CIWA-Ar Total: 6  COWS:  COWS Total Score: 7   Treatment Plan Summary: Daily contact with patient to assess and evaluate symptoms and progress in treatment Medication management. Continue current plan of care with no changes at this time.  Plan: No changes at this time.  Lucas Small 08/24/2011, 2:24 PM

## 2011-08-24 NOTE — Progress Notes (Signed)
[facility] Group Notes:  (Counselor/Nursing/MHT/Case Management/Adjunct)  08/24/2011 12:38 PM   Type of Therapy:  Processing Group at 11:00 am  Participation Level:  Did Not Attend    [facility] Group Notes:  (Counselor/Nursing/MHT/Case Management/Adjunct)  08/24/2011 12:38 PM   Type of Therapy:  Counseling Group at 1:15 pm  Participation Level:  Active  Participation Quality:  Sharing, intrusive, and redirectable  Affect:  Flat  Cognitive:  Alert and oriented  Insight:  Poor  Engagement in Group:  Good  Engagement in Therapy:  Unknown  Modes of Intervention:  Exploration clarification and limit setting  Summary of Progress/Problems:  Sargent attended his first counseling group since admission. During discussion on self-care in response to her earlier education on postacute withdrawal syndrome Keneth presented confusing suggestions which ranged from "AA is awful because of religious factors" to "some knee prayer in order to get better." Tabari responded to limit setting in response to his rambling stories in order to let others in group share.  Ronda Fairly, [place] 08/24/2011 12:38 PM

## 2011-08-25 NOTE — Progress Notes (Signed)
Patient ID: Lucas Small, male   DOB: 09-Jul-1965, 46 y.o.   MRN: [id number] The patient was late for the evening substance abuse group because he had fallen asleep. He stated he felt better this evening and denied any symptoms of withdrawal. Minimal interaction in the milieu.

## 2011-08-25 NOTE — Progress Notes (Signed)
Patient ID: Lucas Small, male   DOB: 1965-12-14, 45 y.o.   MRN: [id number]   Pt has been very flat and depressed on the unit, pt did attend groups and has engaged in treatment. Pt reported on his self inventory that he was depressed (7 out of 10) and that he was hopeless (6 out of 10). Pt reported that his goal for today was to get better. Pt reported being negative SI/HI, no AH/VH noted.

## 2011-08-25 NOTE — Progress Notes (Signed)
Patient ID: Lucas Small, male   DOB: 1965-12-20, 46 y.o.   MRN: [id number]  [facility] Group Notes:  (Counselor/Nursing/MHT/Case Management/Adjunct)  08/25/2011 1:15 PM  Type of Therapy:  Group Therapy, Dance/Movement Therapy   Participation Level:  Active  Participation Quality:  Appropriate  Affect:  Appropriate  Cognitive:  Appropriate  Insight:  Limited  Engagement in Group:  Limited  Engagement in Therapy:  Limited  Modes of Intervention:  Clarification, Problem-solving, Role-play, Socialization and Support  Summary of Progress/Problems:  Therapist discussed with group about forgiveness and breaking patterns. Initially, pt. stated that he" feels yellow because I am feeling indecisive about the choices that I am going to have to make once I leave here that will best aid in my recovery".  Toward the end of group pt. stated that he "can learn how to integrate positive thoughts into my thinking", whenever negative thoughts enter my mind which can potentially lead to self sabotaging behaviors.       Rhunette Croft

## 2011-08-25 NOTE — Progress Notes (Signed)
Patient ID: Lucas Small, male   DOB: 05/20/1966, 46 y.o.   MRN: [id number] Pt. attended and participated in aftercare planning group. Pt. accepted information on suicide prevention, warning signs to look for with suicide and crisis line numbers to use. The pt. agreed to call crisis line numbers if having warning signs or having thoughts of suicide. Pt. listed their current anxiety level as Pt. listed their current anxiety level as 7 on a scale of 1 to 10 with 10 being the high.  Pt. listed depression as a 7. Pt. accepted an A/A meeting schedule.

## 2011-08-25 NOTE — Progress Notes (Signed)
  Lucas Small is a 46 y.o. male [id number] Nov 05, 1965  08/23/2011 Principal Problem:  *Alcohol dependency Active Problems:  Psychoactive substance-induced organic mood disorder   Mental Status: Alert & oriented mood allright denies SI/HI/AVH.    Subjective/Objective: Slight tremor of hands. Has had many losses recently employment GF of 3 years and house. Asks about halfway houses or other programs that might be appropriate for him.    Filed Vitals:   08/25/11 1201  BP: 122/85  Pulse: 89  Temp:   Resp:     Lab Results:   BMET    Component Value Date/Time   NA 141 08/24/2011 0617   K 3.6 08/24/2011 0617   CL 103 08/24/2011 0617   CO2 29 08/24/2011 0617   GLUCOSE 100* 08/24/2011 0617   BUN 12 08/24/2011 0617   CREATININE 1.16 08/24/2011 0617   CALCIUM 10.1 08/24/2011 0617   GFRNONAA 75* 08/24/2011 0617   GFRAA 87* 08/24/2011 0617    Medications:  Scheduled:     . chlordiazePOXIDE  25 mg Oral TID   Followed by  . chlordiazePOXIDE  25 mg Oral BH-qamhs   Followed by  . chlordiazePOXIDE  25 mg Oral Daily  . citalopram  20 mg Oral Daily  . lisinopril  10 mg Oral Daily  . mulitivitamin with minerals  1 tablet Oral Daily  . nicotine  21 mg Transdermal Daily  . potassium chloride  20 mEq Oral BID  . thiamine  100 mg Oral Daily  . traZODone  50 mg Oral QHS     PRN Meds alum & mag hydroxide-simeth, chlordiazePOXIDE, hydrOXYzine, loperamide, magnesium hydroxide, ondansetron Plan: continue current plan of care.   Guy Seese,MICKIE D. 08/25/2011

## 2011-08-26 NOTE — Progress Notes (Signed)
[facility] Group Notes:  (Counselor/Nursing/MHT/Case Management/Adjunct)  08/26/2011 1:15 PM  Type of Therapy:  Group Therapy, Dance/Movement Therapy   Participation Level:  Active  Participation Quality:  Appropriate, sharing, suportive  Affect:  Appropriate and Depressed  Cognitive:  Oriented  Insight:  Good  Engagement in Group:  Limited  Engagement in Therapy:  Good  Modes of Intervention:  Clarification, Problem-solving, Role-play, Socialization and Support  Summary of Progress/Problems: Pt. began group by talking about what memories the Easter holiday brought up, eating chocolate eggs. Pt spoke about supports and how he wished he had more true supports. Pt actively discussed what makes a positive healthy support and how to determine if people are true supports of not.    Lucas Small

## 2011-08-26 NOTE — Progress Notes (Signed)
Patient ID: GREY RAKESTRAW, male   DOB: 1965-08-17, 46 y.o.   MRN: [id number]  Pt continues to be very flat and depressed on the unit, pt did attend groups and has engaged in treatment. Pt continued to report on the self inventory that he was depressed and hopeless.  Pt reported being negative SI/HI, no AH/VH noted.

## 2011-08-26 NOTE — Progress Notes (Signed)
  Lucas Small is a 46 y.o. male [id number] 25-Feb-1966  08/23/2011 Principal Problem:  *Alcohol dependency Active Problems:  Psychoactive substance-induced organic mood disorder   Mental Status: Alert & oriented. Mood is better denies SI/HI/AVH.    Subjective/Objective: Has no shakes today says he still feels depressed and sleep is not restful.     Filed Vitals:   08/26/11 0610  BP: 106/74  Pulse: 120  Temp:   Resp:     Lab Results:   BMET    Component Value Date/Time   NA 141 08/24/2011 0617   K 3.6 08/24/2011 0617   CL 103 08/24/2011 0617   CO2 29 08/24/2011 0617   GLUCOSE 100* 08/24/2011 0617   BUN 12 08/24/2011 0617   CREATININE 1.16 08/24/2011 0617   CALCIUM 10.1 08/24/2011 0617   GFRNONAA 75* 08/24/2011 0617   GFRAA 87* 08/24/2011 0617    Medications:  Scheduled:     . chlordiazePOXIDE  25 mg Oral BH-qamhs   Followed by  . chlordiazePOXIDE  25 mg Oral Daily  . citalopram  20 mg Oral Daily  . lisinopril  10 mg Oral Daily  . mulitivitamin with minerals  1 tablet Oral Daily  . nicotine  21 mg Transdermal Daily  . potassium chloride  20 mEq Oral BID  . thiamine  100 mg Oral Daily  . traZODone  50 mg Oral QHS     PRN Meds alum & mag hydroxide-simeth, chlordiazePOXIDE, hydrOXYzine, loperamide, magnesium hydroxide, ondansetron Plan: Needs help at discharge with placement.             Continue current plan of care.              Lucas Small,MICKIE D. 08/26/2011

## 2011-08-26 NOTE — Progress Notes (Signed)
Patient ID: Lucas Small, male   DOB: 25-Apr-1966, 46 y.o.   MRN: [id number] The patient is very pleasant and appropriate. He offers no complaints of withdrawal, but he has a slight tremor that can be felt. Although he spent the evening in the dayroom watching TV, he interacted minimally in the milieu. He is hoping to go to another longer rehab program upon discharge.

## 2011-08-26 NOTE — Progress Notes (Signed)
Pt has been up and has been active and participating in various milieu activities, pt has endorsed feelings of depression but has denied any feelings of suicide, pt has received all medications without incident, will continue to monitor

## 2011-08-27 DIAGNOSIS — R0683 Snoring: Secondary | ICD-10-CM | POA: Diagnosis present

## 2011-08-27 NOTE — Progress Notes (Signed)
Patient ID: Lucas Small, male   DOB: 03-Apr-1966, 46 y.o.   MRN: [id number] He has been up and to group. Self inventory: Needed medication for sleep last Pm.  Appetite improving, energy lo,attention improving. Depression 7, hopelessness 5, Positive w/d  Sym. Blurred vision, craving. Denies S/I thoughts. He stated that he continued to be  Depressed and did not feel like  It was helping.

## 2011-08-27 NOTE — Progress Notes (Signed)
[facility] MD Progress Note  08/27/2011 4:59 PM  Diagnosis:  Alcohol dependent  ADL's:  Intact  Sleep: Good Increased snoring!  Appetite:  Good  Suicidal Ideation:  denies Homicidal Ideation:  denies  AEB (as evidenced by): Pt. States this morning that he is at a loss as to which direction he needs to go in.  He is not sure he can afford to go to a treatment facility, and is currently considering moving in with his ex-girlfriend. Mental Status Examination/Evaluation: Objective:  Appearance: Fairly Groomed  Patent attorney::  Fair  Speech:  Normal Rate  Volume:  Normal  Mood:  Hopeless  Affect:  Congruent  Thought Process:  Circumstantial, Goal Directed and Linear  Orientation:  Full  Thought Content:  WDL  Suicidal Thoughts:  No  Homicidal Thoughts:  No  Memory:  Immediate;   Fair  Judgement:  Fair  Insight:  Lacking  Psychomotor Activity:  Normal  Concentration:  Fair  Recall:  Fair  Akathisia:  No  Handed:    AIMS (if indicated):     Assets:  Desire for Improvement Social Support  Sleep:  Number of Hours: 4.75    Vital Signs:Blood pressure 103/76, pulse 130, temperature 97.1 F (36.2 C), temperature source Oral, resp. rate 20, height 5\' 7"  (1.702 m), weight 82.555 kg (182 lb). Current Medications: Current Facility-Administered Medications  Medication Dose Route Frequency Provider Last Rate Last Dose  . alum & mag hydroxide-simeth (MAALOX/MYLANTA) 200-200-20 MG/5ML suspension 30 mL  30 mL Oral Q4H PRN Jorje Guild, PA-C      . citalopram (CELEXA) tablet 20 mg  20 mg Oral Daily Curlene Labrum Readling, MD   20 mg at 08/27/11 0846  . lisinopril (PRINIVIL,ZESTRIL) tablet 10 mg  10 mg Oral Daily Curlene Labrum Readling, MD   10 mg at 08/27/11 0846  . magnesium hydroxide (MILK OF MAGNESIA) suspension 30 mL  30 mL Oral Daily PRN Jorje Guild, PA-C      . mulitivitamin with minerals tablet 1 tablet  1 tablet Oral Daily Curlene Labrum Readling, MD   1 tablet at 08/27/11 0846  . nicotine (NICODERM CQ - dosed  in mg/24 hours) patch 21 mg  21 mg Transdermal Daily Curlene Labrum Readling, MD   21 mg at 08/27/11 0846  . thiamine (VITAMIN B-1) tablet 100 mg  100 mg Oral Daily Curlene Labrum Readling, MD   100 mg at 08/27/11 0846  . traZODone (DESYREL) tablet 50 mg  50 mg Oral QHS Curlene Labrum Readling, MD   50 mg at 08/26/11 2224    Lab Results: No results found for this or any previous visit (from the past 48 hour(s)).  Physical Findings: AIMS:  , ,  ,  ,    CIWA:  CIWA-Ar Total: 2  COWS:  COWS Total Score: 7   Treatment Plan Summary: Daily contact with patient to assess and evaluate symptoms and progress in treatment Medication management Pt. is slow to be motivated and seems to be stagnant regarding his next step of care.  Plan: Pt. Is strongly encouraged to consider his primary self care options including housing, food, clothing, access to healthcare.   Mikeyla Music 08/27/2011, 4:59 PM

## 2011-08-27 NOTE — Progress Notes (Signed)
Asleep at long intervals.  Wakeful earlier part of night.  Sad facies.  Suggestion given to utilize individual and group therapy as opportunities to deal with stressors/ issues, and to inform doctor of difficulty falling asleep.  Safety maintained via Q 15 minute safety checks.

## 2011-08-28 MED ORDER — TRAZODONE HCL 100 MG PO TABS
100.0000 mg | ORAL_TABLET | Freq: Every day | ORAL | Status: DC
  Administered 2011-08-28 – 2011-08-29 (×2): 100 mg via ORAL
  Filled 2011-08-28 (×4): qty 1

## 2011-08-28 MED ORDER — TUBERCULIN PPD 5 UNIT/0.1ML ID SOLN
5.0000 [IU] | Freq: Once | INTRADERMAL | Status: AC
  Administered 2011-08-28: 5 [IU] via INTRADERMAL

## 2011-08-28 NOTE — Progress Notes (Signed)
[facility] Group Notes:  (Counselor/Nursing/MHT/Case Management/Adjunct)  08/28/2011    Type of Therapy:  Processing Group at 11:00 am on Monday 4.1.13  Participation Level:  Active  Participation Quality:  Attentive and sharing  Affect:  Argumentative  Cognitive:  Oriented  Insight:  Limited  Engagement in Group:  Good  Engagement in Therapy:  Unknown  Modes of Intervention:  Limit setting and clarification  Summary of Progress/Problems:  Lucas Small shared that his "biggest obstacle to recovery is the drink itself, and the drink is the Devil and recovery can be the Devil too."  Once patient quoted scripture patient received clear limits from group facilitator.  [facility] Group Notes:  (Counselor/Nursing/MHT/Case Management/Adjunct)  08/28/2011 5:46 PM   Type of Therapy:   Group at 1:15 pm on Monday 4.1.13  Participation Level:  None  Participation Quality:  Inattentive  Affect:  flat  Cognitive:  oriented  Insight:  None shared  Engagement in Group:  None  Engagement in Therapy:  None  Modes of Intervention:  Education  Summary of Progress/Problems: [facility] volunteer did not show for presentation thus Clinical research associate provided educational information on services available at [facility].   Ronda Fairly, LCSWA 08/28/2011 5:46 PM

## 2011-08-28 NOTE — Progress Notes (Signed)
Recreation Therapy Group Note  Date: 08/28/2011        Time: 1000       Group Topic/Focus: Patient invited to participate in animal assisted therapy. Pets as a coping skill and responsibility were discussed.   Participation Level: Active  Participation Quality: Appropriate and Attentive  Affect: Appropriate  Cognitive: Alert   Additional Comments: Patient ate several dog treats, it is unclear why he did so but he insisted they weren't that bad.

## 2011-08-28 NOTE — Progress Notes (Signed)
Pt. Denies SI/HI and denies A/V hallucinations.  Minimal interaction with peers and staff.  Pt. Compliant with medications.

## 2011-08-28 NOTE — Progress Notes (Signed)
  08/28/2011         Time: 1415      Group Topic/Focus: The focus of this group is on discussing various styles of communication and communicating assertively using 'I' (feeling) statements.  Participation Level: Active  Participation Quality: Intrusive, Monopolizing and Redirectable  Affect: Excited  Cognitive: Oriented   Additional Comments: Patient somewhat bizarre, making strange comments about things like dog treats. Patient talked about feeling agitated every time his wife tries to talk to him when he gets home from work.   Chi Garlow 08/28/2011 4:00 PM

## 2011-08-28 NOTE — Progress Notes (Signed)
Pt observed in the dayroom watching TV with his peers.  He also attended evening group.  Pt reports minimal withdrawal symptoms tonight.  He voices no complaints to this Clinical research associate.  He denies SI/HI at this time.  He is not sure of his discharge plans.  Encouraged pt to make his needs known to staff.  Safety maintained with q15 minute checks.

## 2011-08-28 NOTE — Progress Notes (Signed)
Patient ID: Lucas Small, male   DOB: [birth year], 46 y.o.   MRN: [id number] He has been up and about and to part of the groups. Poor interaction with peers and staff. Keeping to his room if not in a group. Self inventory depression 7, hopelessness 5,  Denies SI thoughts. TB test  Given  today.

## 2011-08-28 NOTE — Progress Notes (Signed)
[facility] Group Notes:  (Counselor/Nursing/MHT/Case Management/Adjunct)  08/28/2011    Type of Therapy:  Processing Group at 11:00 am on Tuesday 08/28/11  Participation Level:  Active  Participation Quality:  Sharing  Affect:  Labile  Cognitive:  Oriented  Insight:  Poor  Engagement in Group:  Good  Engagement in Therapy:  Limited  Modes of Intervention:  Exploration, clarification, support and limit setting  Summary of Progress/Problems:  Patient was antagonistic in group challenging group facilitator; wanting to bring God and the Devil into conversation about disease concept of alcoholism.  When asked if there was a problem Xaiden feigned ignorance of any negative motivation.  Yeiden continued to remain in group and facilitator ignored his physical gestures.    [facility] Group Notes:  (Counselor/Nursing/MHT/Case Management/Adjunct)  08/28/2011 5:46 PM   Type of Therapy:  Counseling Group at 1:15 pm  Participation Level:  Minimal   Participation Quality:  Non verbal  Affect:  Labile  Cognitive:  Oriented  Insight:  None shared  Engagement in Group:  Minimal   Engagement in Therapy:  Unknown  Modes of Intervention:  Exploration  Summary of Progress/Problems:  Ramond was attentive to group process; followed facilitators every move visually and minimally hid his silent laughter behind his hands.  Ronda Fairly, LCSWA 08/28/2011 5:46 PM

## 2011-08-28 NOTE — Discharge Planning (Signed)
Today patient stated in group that he has decided he needs to go to rehab after a long discussion about different women in his life and why he should or should not pursue them.  Shared that he is actually homeless, and is interested in [facility] program.  Filled out application and I sent it along with accompanying info to [facility].  She called back with an offer to interview him tomorrow at [facility].  Hopefully D/C to [facility] on Thursday.

## 2011-08-29 MED ORDER — EPINEPHRINE 0.3 MG/0.3ML IJ DEVI
0.3000 mg | Freq: Once | INTRAMUSCULAR | Status: AC | PRN
  Filled 2011-08-29: qty 0.6

## 2011-08-29 NOTE — Progress Notes (Signed)
Pt resting in bed with eyes closed.  No distress observed.  Safety maintained with q15 minute checks. 

## 2011-08-29 NOTE — Progress Notes (Signed)
[facility] Group Notes:  (Counselor/Nursing/MHT/Case Management/Adjunct)  08/29/2011 4:23 PM  Type of Therapy:  1:15PM Group Therapy  Participation Level:  Minimal  Participation Quality:  Appropriate and Attentive  Affect:  Appropriate  Cognitive:  Alert and Appropriate  Insight:  Limited  Engagement in Group:  Limited  Engagement in Therapy:  Limited  Modes of Intervention:  Clarification, Education, Support and Exploration  Summary of Progress/Problems: Patient reported when he starts to feel irritated he then feels like drinking. Patient seemed to relate well to the thoughts and opinions of his peers. Patient was actively engaged in the processing group discussion around emotion regulation. Patient appears invested in his treatment.   Wilmon Arms 08/29/2011, 4:23 PM

## 2011-08-29 NOTE — Progress Notes (Signed)
[facility] Case Management Discharge Plan:  Will you be returning to the same living situation after discharge: No. At discharge, do you have transportation home?:Yes,  [facility] to pick up Do you have the ability to pay for your medications:Yes,  [facility]  Interagency Information:     Release of information consent forms completed and in the chart;  Patient's signature needed at discharge.  Patient to Follow up at:  Follow-up Information    Follow up with [facility] on 08/30/2011.   Contact information:   [address], [place], [place] [address] Phone: [phone]         Patient denies SI/HI:   Yes,  yes    Safety Planning and Suicide Prevention discussed:  Yes,  yes  Barrier to discharge identified:No.  Summary and Recommendations:   Lucas Small 08/29/2011, 2:18 PM

## 2011-08-29 NOTE — Progress Notes (Signed)
Patient ID: Lucas Small, male   DOB: [birth year], 46 y.o.   MRN: [id number]   Subjective: Lucas Small is doing "well." He completed his application to [facility] and did his phone interview.  He has now been accepted and will anticipate being discharged on Thursday morning.  He voices no new complaints, states he slept well.  Objective: Lucas Small is doing well once properly motivated.  He has decreased his ruminations over his next step, and made a decision.  The hard part for him will be the "follow through." Meds will be ordered for tomorrow so that he will have prescriptions and a supply of sample meds to use until he can be seen by psychiatrist.  Rona Ravens. Koy Lamp PAC 08/29/2011  11:13 pm

## 2011-08-29 NOTE — Progress Notes (Signed)
Patient ID: Lucas Small, male   DOB: January 11, 1966, 46 y.o.   MRN: [id number] He has been up and about interacting with peers and staff more today. Per self inventory he is depressed at 7, hopeless at 5, continues to have cravings and denies SI thoughts.  Has not requested and PRN medications  Today.

## 2011-08-29 NOTE — Discharge Planning (Signed)
Pt present in morning group. Reports improvement in hours of sleep. Had an interview with [facility] program today and was accepted. Pt to d/c tomorrow.

## 2011-08-29 NOTE — Progress Notes (Signed)
[facility] Group Notes:  (Counselor/Nursing/MHT/Case Management/Adjunct)  08/29/2011 2:37 PM   Type of Therapy:  Processing Group at 11:00 am  Participation Level:  Minimal  Participation Quality:  Attentive and sharing  Affect:  Appropriate  Cognitive:  Oriented and Alert  Insight:  Limited  Engagement in Group:  Limited  Engagement in Therapy:  Limited  Modes of Intervention:  Exploration and Clarification  Summary of Progress/Problems: Mattias shared during session on emotional regulation that being frightened is one of the hardest emotions for him to regulate.   "Living up to the expectations of others can be impossible and seem overwhelming."  [name] 08/29/2011 2:50 PM

## 2011-08-29 NOTE — Progress Notes (Signed)
Pt has been in bed most of the evening.  He did not attend evening group.  He voices no complaints/concerns.  He reports he is discharging Thursday by 1100 to go to [facility] [place].  He states he is ok with this plan.  He denies any withdrawal symptoms.  He denies SI/HI.  Safety maintained with q15 minute checks.

## 2011-08-30 DIAGNOSIS — F1020 Alcohol dependence, uncomplicated: Principal | ICD-10-CM

## 2011-08-30 MED ORDER — LISINOPRIL 10 MG PO TABS: 10.0000 mg | ORAL_TABLET | Freq: Every day | ORAL | Status: DC

## 2011-08-30 MED ORDER — NICOTINE 21 MG/24HR TD PT24: 1.0000 | MEDICATED_PATCH | Freq: Every day | TRANSDERMAL | Status: AC

## 2011-08-30 MED ORDER — TRAZODONE HCL 100 MG PO TABS: 50.0000 mg | ORAL_TABLET | Freq: Every day | ORAL | Status: DC

## 2011-08-30 MED ORDER — CITALOPRAM HYDROBROMIDE 20 MG PO TABS: 20.0000 mg | ORAL_TABLET | Freq: Every day | ORAL | Status: DC

## 2011-08-30 NOTE — Progress Notes (Signed)
[facility] Case Management Discharge Plan:  Will you be returning to the same living situation after discharge: No. At discharge, do you have transportation home?:Yes,  Pt will follow up with [facility]. [facility] will provide transportation for pt to facility. Do you have the ability to pay for your medications:Yes,  Pt has access to meds  Interagency Information:     Release of information consent forms completed and in the chart;  Patient's signature needed at discharge.  Patient to Follow up at:  Follow-up Information    Follow up with [facility] on 08/30/2011. ([facility] will pick pt up at 11:00 am!!)    Contact information:   [address] [place], [place] [address] Phone: [phone]         Patient denies SI/HI:   Yes,  Pt denies SI/HI per self inventory and suicide risk assessment     Safety Planning and Suicide Prevention discussed:  Yes,  Discussed with patient  Barrier to discharge identified:No.  Summary and Recommendations:Pt will follow up with [facility]. Pt denies any SI/HI.   Lucas Small 08/30/2011, 11:41 AM

## 2011-08-30 NOTE — Discharge Summary (Signed)
Physician Discharge Summary Note  Patient:  Lucas Small is an 46 y.o., male MRN:  [id number] DOB:  12/22/1965 Patient phone:  [phone] (home)  Patient address:   [address] [place] [address],   Date of Admission:  08/23/2011 Date of Discharge: 08/30/2011  Reason for Admission:Detox  Discharge Diagnoses: Principal Problem:  *Alcohol dependency Active Problems:  Substance induced mood disorder  Snoring disorder   Axis Diagnosis:   AXIS I:  Alcohol dependence  AXIS II:  deferred AXIS III:  HTN AXIS IV:  Problems with primary support groups, with access to medical care AXIS V:  GAF 60 prior to admission unknown  Level of Care:  Outpatient  Hospital Course:        Darylene Price was admitted for detox from alcohol and crisis management.  He/she was treated with the standard Librium protocol.  Medical problems were identified and treated.  Home medication was restarted as appropriate.     Improvement was monitored by CIWA scores and patient's daily report of withdrawal symptom reduction. Emotional and mental status was monitored by daily self inventory reports completed by the patient and clinical staff.      The patient was evaluated by the treatment team for stability and plans for continued recovery upon discharge. He was offered further treatment options upon discharge including Residential, IOP, and Outpatient treatment.  The patient's motivation was an integral factor for scheduling further treatment.  Employment, transportation, bed availability, health status, family support, and any pending legal issues were also considered. Asif was poorly motivated and short sighted regarding his current situation. He was often distracted by his need for employment or trying to decide which ex girlfriend he could return to living with.    Upon completion of detox the patient was medically stable for discharge but showing little insight was started on Celexa as an antidepressant.  He was encouraged to apply for, and interview with [facility].  After much prompting Wilho completed the application with assistance from the PA student.  He interviewed and was accepted.  On the day of discharge Jakwon was more optimistic and demonstrated more motivation for success.    Consults: None  Significant Diagnostic Studies:  none  Discharge Vitals:   Blood pressure 110/76, pulse 118, temperature 98 F (36.7 C), temperature source Oral, resp. rate 17, height 5\' 7"  (1.702 m), weight 82.555 kg (182 lb).  Mental Status Exam: See Mental Status Examination and Suicide Risk Assessment completed by Attending Physician prior to discharge.  Discharge destination:  [facility] Is patient on multiple antipsychotic therapies at discharge:  No   Has Patient had three or more failed trials of antipsychotic monotherapy by history:  No Recommended Plan for Multiple Antipsychotic Therapies: not applicable Discharge Orders    Future Orders Please Complete By Expires   Increase activity slowly      Discharge instructions      Comments:   Take all medication as prescribed.     Medication List  As of 08/30/2011 10:43 AM   TAKE these medications      Indication    citalopram 20 MG tablet   Commonly known as: CELEXA   Take 1 tablet (20 mg total) by mouth daily.    Indication: Excessive Use of Alcohol, Depression, Social Anxiety Disorder      lisinopril 10 MG tablet   Commonly known as: PRINIVIL,ZESTRIL   Take 1 tablet (10 mg total) by mouth daily.    Indication: High Blood Pressure  nicotine 21 mg/24hr patch   Commonly known as: NICODERM CQ - dosed in mg/24 hours   Place 1 patch onto the skin daily.    Indication: Nicotine Addiction      traZODone 100 MG tablet   Commonly known as: DESYREL   Take 0.5 tablets (50 mg total) by mouth at bedtime.    Indication: Trouble Sleeping           Follow-up Information    Follow up with [facility] on 08/30/2011. ([facility] will pick pt up at 11:00 am!!)     Contact information:   [address], [place] [address] Phone: [phone]         Follow-up recommendations:  See above  Comments:  It is our recommendation that Larence remain on Celexa and follow up with a psychiatrist and a therapist to assist with stability and increase his chances for continued sobriety.  Signed: Ellorie Kindall 08/30/2011, 10:43 AM

## 2011-08-30 NOTE — Progress Notes (Signed)
Cosigned by Carney Bern, LCSWA 4/4/201310:01 AM

## 2011-08-30 NOTE — Tx Team (Signed)
Interdisciplinary Treatment Plan Update (Adult)  Date:  08/30/2011  Time Reviewed:  11:41 AM   Progress in Treatment: Attending groups: Yes Participating in groups:  Yes Taking medication as prescribed: Yes Tolerating medication:  Yes Family/Significant othe contact made:   Patient understands diagnosis:  Yes Discussing patient identified problems/goals with staff:  Yes Medical problems stabilized or resolved:  Yes Denies suicidal/homicidal ideation: Yes Issues/concerns per patient self-inventory:  None identified Other: N/A  New problem(s) identified: None Identified  Reason for Continuation of Hospitalization: Stable to d/c   Interventions implemented related to continuation of hospitalization: Stable to d/c  Additional comments: N/A  Estimated length of stay: Pending d/c today  Discharge Plan: Pt will follow up at [facility] for further SA treatment and housing.   New goal(s): N/A  Review of initial/current patient goals per problem list:    1.  Goal(s): Address substance use  Met:  Yes  Target date: by discharge  As evidenced by: completed detox protocol and referred to appropriate treatment  2.  Goal (s): Reduce depressive and anxiety symptoms  Met:  Yes  Target date: by discharge  As evidenced by: Reducing depression from a 10 to a 3 as reported by pt.  3.  Goal(s): Eliminate SI  Met:  Yes  Target date: by discharge  As evidenced by: Pt denies SI.    Attendees: Patient:  Lucas Small 08/30/2011 11:44 AM   Family:     Physician:   08/30/2011 11:41 AM   Nursing: Roswell Miners, RN 08/30/2011 11:41 AM   Case Manager:  Reyes Ivan, LCSWA 08/30/2011  11:41 AM   Counselor:  Ronda Fairly, LCSWA 08/30/2011  11:41 AM   Other:  Tanya Nones, SW intern 08/30/2011 11:41 AM   Other:  Izola Price, RN 08/30/2011 11:44 AM   Other:  Verne Spurr, PA 08/30/2011 11:44 AM   Other:      Scribe for Treatment Team:   Reyes Ivan 08/30/2011 11:41 AM

## 2011-08-30 NOTE — Progress Notes (Signed)
Patient ID: Lucas Small, male   DOB: [birth year], 46 y.o.   MRN: [id number] He has been up and about on the unit and in interacting with peers and staff. Has not reported and discomfort .

## 2011-08-30 NOTE — [facility] Suicide Risk Assessment (Signed)
Suicide Risk Assessment  Discharge Assessment     Demographic factors:   Current Mental Status Per Nursing Assessment::   On Admission:    At Discharge:  AO x 3.  No SI/HI.  Current Mental Status Per Physician:  Diagnosis:  Axis I: Schizoaffective Disorder - Depressed Type.  Posttraumatic Stress Disorder.  Alcohol Dependence.   The patient was seen today and reports the following:   ADL's: Intact.  Sleep: The patient reports to having significant difficulty initiating and maintaining sleep. Appetite: The patient reports a good appetite today.   Mild>(1-10) >Severe  Hopelessness (1-10): 3  Depression (1-10): 6  Anxiety (1-10): 7-8   Suicidal Ideation: The patient denies any suicidal ideations.  Plan: No  Intent: No  Means: No  Homicidal Ideation: The patient adamantly denies any homicidal ideations today.  Plan: No  Intent: No.  Means: No   General Appearance/Behavior: The patient was friendly and cooperative today.  Eye Contact: Good.  Speech: Appropriate in rate and volume with no pressuring of speech noted today.  Motor Behavior: wnl.  Level of Consciousness: Alert and Oriented x 3.  Mental Status: Alert and Oriented x 3.  Mood: Moderate depression today.  Affect: Moderately constricted today.  Anxiety Level: Severe anxiety reported.  Thought Process: wnl.  Thought Content: The patient denies any auditory or visual hallucinations.  He denies any delusional thinking.  Perception: wnl.  Judgment: Fair to Good.  Insight: Fair to Good.  Cognition: Oriented to person, place and time.   Loss Factors: None known.  Historical Factors: Substance abuse related issues.  Risk Reduction Factors:   Good primary support.  Continued Clinical Symptoms:  Severe Anxiety and/or Agitation Depression:   Anhedonia Comorbid alcohol abuse/dependence Insomnia Alcohol/Substance Abuse/Dependencies More than one psychiatric diagnosis Previous Psychiatric Diagnoses and  Treatments  Discharge Diagnoses:   AXIS I:   Alcohol Dependence.   Substance Induced Mood Disorder. AXIS II:   Deferred. AXIS III:  None Reported.  AXIS IV:   Substance Abuse Related Issues. AXIS V:   GAF at time of admission approximately 35.  GAF at time of discharge 50.  Cognitive Features That Contribute To Risk:  None Noted.  Lab Results: No results found for this or any previous visit (from the past 48 hour(s)).   The patient was seen today and states that he has been having significant difficulty initiating and maintaining sleep.  He reports a good appetite and moderate depression and severe anxiety.  He denies any SI/HI as well as any auditory of visual hallucinations or delusional thinking.  He also denies any symptoms of substance withdrawal.  The patient states that he is ready to go to [facility] for further treatment of his substance abuse issues.  Treatment Plan Summary:  1. Daily contact with patient to assess and evaluate symptoms and progress in treatment  2. Medication management  3. The patient will deny suicidal ideations or homicidal ideations for 48 hours prior to discharge and have a depression and anxiety rating of 3 or less. The patient will also deny any auditory or visual hallucinations or delusional thinking.  4. The patient will deny any symptoms of substance withdrawal at time of discharge.   Plan:  1. Will continue the patient on his current medications.  2. Will continue to monitor.  3. Laboratory studies reviewed.  4. Discharge today to enter [facility] for further treatment of his substance abuse issues.  Suicide Risk:  Minimal: No identifiable suicidal ideation.  Patients presenting with no  risk factors but with morbid ruminations; may be classified as minimal risk based on the severity of the depressive symptoms  Plan Of Care/Follow-up recommendations:  Activity:  As tolerated. Diet:  Regular Diet. Other:  The patient is being discharged today to enter  [facility] for further treatment of his substance abuse issues.  Lucas Small 08/30/2011, 10:10 AM

## 2011-08-30 NOTE — Progress Notes (Signed)
Patient ID: Lucas Small, male   DOB: 1965/11/28, 46 y.o.   MRN: [id number] Pt was picked up  By staff from [facility].  Iasiah voiced understanding of discharge instruction and of follow  Up.  His PPD was Negative .   Instructed him on how to use the EPI-PEN and it was sent with him. All belonging taken with  Him and he denies thoughts of SI.

## 2011-08-31 NOTE — Progress Notes (Signed)
Patient Discharge Instructions:  Psychiatric Admission Assessment Note Provided,  08/31/2011 After Visit Summary (AVS) Provided,  08/31/2011 Face Sheet Provided, 08/31/2011 Faxed/Sent to the Next Level Care provider:  08/31/2011 Provided Suicide Risk Assessment - Discharge Assessment 08/31/2011  Faxed to [facility] @ [phone]  Wandra Scot, 08/31/2011, 4:35 PM

## 2011-10-01 ENCOUNTER — Other Ambulatory Visit: Payer: Self-pay | Admitting: Internal Medicine

## 2011-10-01 DIAGNOSIS — R51 Headache: Secondary | ICD-10-CM

## 2011-10-04 ENCOUNTER — Other Ambulatory Visit: Payer: Self-pay

## 2011-11-13 ENCOUNTER — Encounter (HOSPITAL_COMMUNITY): Payer: Self-pay | Admitting: *Deleted

## 2011-11-13 ENCOUNTER — Emergency Department (HOSPITAL_COMMUNITY)
Admission: EM | Admit: 2011-11-13 | Discharge: 2011-11-14 | Disposition: A | Discharge status: Other Acute Inpatient Hospital | Payer: Medicaid Other | Attending: Emergency Medicine | Admitting: Emergency Medicine

## 2011-11-13 DIAGNOSIS — F1010 Alcohol abuse, uncomplicated: Secondary | ICD-10-CM | POA: Insufficient documentation

## 2011-11-13 DIAGNOSIS — F17200 Nicotine dependence, unspecified, uncomplicated: Secondary | ICD-10-CM | POA: Insufficient documentation

## 2011-11-13 DIAGNOSIS — F3289 Other specified depressive episodes: Secondary | ICD-10-CM | POA: Insufficient documentation

## 2011-11-13 DIAGNOSIS — F329 Major depressive disorder, single episode, unspecified: Secondary | ICD-10-CM | POA: Insufficient documentation

## 2011-11-13 DIAGNOSIS — R45851 Suicidal ideations: Secondary | ICD-10-CM | POA: Insufficient documentation

## 2011-11-13 DIAGNOSIS — F10929 Alcohol use, unspecified with intoxication, unspecified: Secondary | ICD-10-CM

## 2011-11-13 HISTORY — DX: Alcohol dependence, uncomplicated: F10.20

## 2011-11-13 LAB — URINALYSIS, ROUTINE W REFLEX MICROSCOPIC
Bilirubin Urine: NEGATIVE
Glucose, UA: NEGATIVE mg/dL
Ketones, ur: NEGATIVE mg/dL
Leukocytes, UA: NEGATIVE
Specific Gravity, Urine: 1.014 (ref 1.005–1.030)
pH: 5 (ref 5.0–8.0)

## 2011-11-13 LAB — RAPID URINE DRUG SCREEN, HOSP PERFORMED
Benzodiazepines: NOT DETECTED
Cocaine: NOT DETECTED

## 2011-11-13 LAB — BASIC METABOLIC PANEL
BUN: 13 mg/dL (ref 6–23)
CO2: 23 mEq/L (ref 19–32)
GFR calc non Af Amer: 90 mL/min — ABNORMAL LOW (ref >90)
Glucose, Bld: 97 mg/dL (ref 70–99)
Potassium: 3.7 mEq/L (ref 3.5–5.1)

## 2011-11-13 LAB — CBC
HCT: 41.8 % (ref 39.0–52.0)
Hemoglobin: 14.6 g/dL (ref 13.0–17.0)
MCH: 33.1 pg (ref 26.0–34.0)
MCHC: 34.9 g/dL (ref 30.0–36.0)
RBC: 4.41 MIL/uL (ref 4.22–5.81)

## 2011-11-13 MED ORDER — VITAMIN B-1 100 MG PO TABS
100.0000 mg | ORAL_TABLET | Freq: Every day | ORAL | Status: DC
  Administered 2011-11-13: 100 mg via ORAL
  Filled 2011-11-13: qty 1

## 2011-11-13 MED ORDER — SODIUM CHLORIDE 0.9 % IV SOLN: 1000.0000 mL | Freq: Once | INTRAVENOUS | Status: DC

## 2011-11-13 MED ORDER — THIAMINE HCL 100 MG/ML IJ SOLN: 100.0000 mg | Freq: Every day | INTRAMUSCULAR | Status: DC

## 2011-11-13 MED ORDER — ADULT MULTIVITAMIN W/MINERALS CH
1.0000 | ORAL_TABLET | Freq: Every day | ORAL | Status: DC
  Administered 2011-11-13: 1 via ORAL
  Filled 2011-11-13: qty 1

## 2011-11-13 MED ORDER — SODIUM CHLORIDE 0.9 % IV SOLN
1000.0000 mL | Freq: Once | INTRAVENOUS | Status: AC
  Administered 2011-11-13: 1000 mL via INTRAVENOUS

## 2011-11-13 MED ORDER — CITALOPRAM HYDROBROMIDE 20 MG PO TABS: 20.0000 mg | ORAL_TABLET | Freq: Every day | ORAL | Status: DC

## 2011-11-13 MED ORDER — ALUM & MAG HYDROXIDE-SIMETH 200-200-20 MG/5ML PO SUSP
30.0000 mL | ORAL | Status: DC | PRN
  Administered 2011-11-13: 30 mL via ORAL
  Filled 2011-11-13: qty 30

## 2011-11-13 MED ORDER — SODIUM CHLORIDE 0.9 % IV SOLN: 1000.0000 mL | INTRAVENOUS | Status: DC

## 2011-11-13 MED ORDER — FOLIC ACID 1 MG PO TABS
1.0000 mg | ORAL_TABLET | Freq: Every day | ORAL | Status: DC
  Administered 2011-11-13: 1 mg via ORAL
  Filled 2011-11-13: qty 1

## 2011-11-13 MED ORDER — NICOTINE 21 MG/24HR TD PT24
21.0000 mg | MEDICATED_PATCH | Freq: Every day | TRANSDERMAL | Status: DC
  Administered 2011-11-13: 21 mg via TRANSDERMAL

## 2011-11-13 MED ORDER — LISINOPRIL 10 MG PO TABS
10.0000 mg | ORAL_TABLET | Freq: Every day | ORAL | Status: DC
  Administered 2011-11-13: 10 mg via ORAL
  Filled 2011-11-13: qty 1

## 2011-11-13 MED ORDER — LISINOPRIL 10 MG PO TABS: 10.0000 mg | ORAL_TABLET | Freq: Every day | ORAL | Status: DC

## 2011-11-13 MED ORDER — LORAZEPAM 1 MG PO TABS
1.0000 mg | ORAL_TABLET | Freq: Four times a day (QID) | ORAL | Status: DC | PRN
  Administered 2011-11-13 (×2): 1 mg via ORAL
  Filled 2011-11-13 (×2): qty 1

## 2011-11-13 MED ORDER — IBUPROFEN 600 MG PO TABS: 600.0000 mg | ORAL_TABLET | Freq: Three times a day (TID) | ORAL | Status: DC | PRN

## 2011-11-13 MED ORDER — ONDANSETRON HCL 4 MG PO TABS: 4.0000 mg | ORAL_TABLET | Freq: Three times a day (TID) | ORAL | Status: DC | PRN

## 2011-11-13 MED ORDER — ACETAMINOPHEN 325 MG PO TABS: 650.0000 mg | ORAL_TABLET | ORAL | Status: DC | PRN

## 2011-11-13 MED ORDER — LORAZEPAM 2 MG/ML IJ SOLN: 1.0000 mg | Freq: Four times a day (QID) | INTRAMUSCULAR | Status: DC | PRN

## 2011-11-13 MED ORDER — ZOLPIDEM TARTRATE 5 MG PO TABS: 5.0000 mg | ORAL_TABLET | Freq: Every evening | ORAL | Status: DC | PRN

## 2011-11-13 MED ORDER — CITALOPRAM HYDROBROMIDE 20 MG PO TABS
20.0000 mg | ORAL_TABLET | Freq: Every day | ORAL | Status: DC
Start: 2011-11-13 — End: 2011-11-14
  Administered 2011-11-13: 20 mg via ORAL
  Filled 2011-11-13: qty 1

## 2011-11-13 NOTE — ED Notes (Signed)
Waiting to hear back from [facility]

## 2011-11-13 NOTE — ED Notes (Signed)
Pt has been napping all day. Awakens and jokes with staff. Is appropriate at this time to person, place, and situation.

## 2011-11-13 NOTE — ED Notes (Signed)
Pt states he has been having increased depression and also alcohol abuse.  Wants detox from alcohol.  When asked if he was suicidal or homicidal he said, "I can't say that I am".

## 2011-11-13 NOTE — ED Provider Notes (Signed)
9:07 AM Patient awake and ambulated to bathroom without difficulty. Tolerating his diet.  States that he is depressed because of life in general, no job.  Wife is disabled.  Admits to taking at least 2 hydrocodone with the alcohol.  Does not remember how many exactly or how much he drank.  States that he is not suicidal or homocidal. Requesting detox from [facility].  Remi Haggard, NP 11/13/11 [phone]

## 2011-11-13 NOTE — ED Notes (Signed)
MD - Psychiatrist telepsych @ bedside.

## 2011-11-13 NOTE — ED Notes (Signed)
Upon assuming care of pt., there was no documentation in chart for pt belongings.  I asked pt if he had any belongings and he stated he did not know if they were here or if they may have been taken home by his family.  Pt says he did not know any phone numbers so that he could call and check if his family had his belongings.

## 2011-11-13 NOTE — ED Notes (Signed)
telepsych form completed, faxed along with labs and pt information , demographics.

## 2011-11-13 NOTE — BH Assessment (Addendum)
Assessment Note   Lucas Small is a 46 y.o. male who was brought in by police after making suicidal comments to his wife after drinking 1 bottle of liquor 6/17. Pt is currently here voluntarily. Pt endorses SI with no plan, denies HI, and [facility]. Pt endorses depressives symptoms including anhedonia, insomnia, decresead concentration, feelings of worthlessness and hopelessness, and isolation. He states depressive symptoms and SI have come and gone over the past 2 years. Pt states he lost his job 2 years ago and has been unable to find employment since that time. He states that he also takes care of his wife who has a chronic illness and frequently feels overwhelmed. He states he has "lost the zip for life." He reports having anxiety over his future, frequently having racing thoughts and SI. He reports yesterday he had an increase in anxiety when his food stamps did not arrive.    Pt has been receiving mental health services through [place] but recently moved to [place] which he states is a barrier to continuing to receive services. He reports 1 prior inpatient stay at [facility] in 07/2011. He states he is prescribed Celexia and is compliant with prescription. He states he is also prescribed other psychotropic medications but can not remember the names or dosages.       Pt reports drinking "a couple of beers" nightly to "help me sleep." He states prior to his inpatient stay in March, he drank 1/5 of liquor daily. He states 6/17 he drank a bottle of liquor. He denies all other SA.   Pt completed a telepsych which recommends pt be admitted to a psychiatric unit at this time, as he is not able to contract for safety.  Axis I: Biploar Disorder, unspecified and Alcohol Dependence  Axis II: Deferred Axis III:  Past Medical History  Diagnosis Date  . Depression   . Alcoholic    Axis IV: economic problems, other psychosocial or environmental problems, problems related to social environment and problems with  access to health care services Axis V: 31-40 impairment in reality testing  Past Medical History:  Past Medical History  Diagnosis Date  . Depression   . Alcoholic     History reviewed. No pertinent past surgical history.  Family History: History reviewed. No pertinent family history.  Social History:  reports that he has been smoking Cigarettes.  He has been smoking about 1.5 packs per day. He does not have any smokeless tobacco history on file. He reports that he drinks alcohol. He reports that he uses illicit drugs (Oxycodone).  Additional Social History:  Alcohol / Drug Use History of alcohol / drug use?: Yes Substance #1 Name of Substance 1: alcohol 1 - Amount (size/oz): 2 beers a night, previously 1/5 liquor 1 - Frequency: daily 1 - Duration: 3 months, previously 2 years 1 - Last Use / Amount: 11/12/11, a bottle of liquor  CIWA: CIWA-Ar BP: 109/72 mmHg Pulse Rate: 90  Nausea and Vomiting: no nausea and no vomiting Tactile Disturbances: none Tremor: not visible, but can be felt fingertip to fingertip Auditory Disturbances: not present Paroxysmal Sweats: no sweat visible Visual Disturbances: not present Anxiety: no anxiety, at ease Headache, Fullness in Head: none present Agitation: normal activity Orientation and Clouding of Sensorium: oriented and can do serial additions CIWA-Ar Total: 1  COWS:    Allergies:  Allergies  Allergen Reactions  . Nutritional Supplements Anaphylaxis    Home Medications:  (Not in a hospital admission)  OB/GYN Status:  No  LMP for male patient.  General Assessment Data Location of Assessment: [facility] ED Living Arrangements: Spouse/significant other Can pt return to current living arrangement?: Yes Admission Status: Voluntary Is patient capable of signing voluntary admission?: Yes Transfer from: Acute Hospital Referral Source: Self/Family/Friend  Education Status Is patient currently in school?: No  Risk to self Suicidal  Ideation: Yes-Currently Present Suicidal Intent: Yes-Currently Present Is patient at risk for suicide?: Yes Suicidal Plan?: No Access to Means: No What has been your use of drugs/alcohol within the last 12 months?: alcohol Previous Attempts/Gestures: No How many times?: 0  Other Self Harm Risks: none Triggers for Past Attempts: None known Intentional Self Injurious Behavior: None Family Suicide History: No Recent stressful life event(s): Financial Problems;Job Loss Persecutory voices/beliefs?: No Depression: Yes Depression Symptoms: Despondent;Loss of interest in usual pleasures;Feeling worthless/self pity;Isolating;Fatigue Substance abuse history and/or treatment for substance abuse?: Yes Suicide prevention information given to non-admitted patients: Not applicable  Risk to Others Homicidal Ideation: No Thoughts of Harm to Others: No Current Homicidal Intent: No Current Homicidal Plan: No Access to Homicidal Means: No Identified Victim: none History of harm to others?: No Assessment of Violence: None Noted Violent Behavior Description: cooperative Does patient have access to weapons?: No Criminal Charges Pending?: No Does patient have a court date: No  Psychosis Hallucinations: None noted Delusions: None noted  Mental Status Report Appear/Hygiene: Disheveled;Body odor Eye Contact: Poor Motor Activity: Unremarkable Speech: Logical/coherent Level of Consciousness: Quiet/awake Mood: Depressed Affect: Appropriate to circumstance;Depressed Anxiety Level: Severe Thought Processes: Coherent;Relevant Judgement: Impaired Orientation: Person;Place;Time;Situation Obsessive Compulsive Thoughts/Behaviors: None  Cognitive Functioning Concentration: Decreased Memory: Recent Intact;Remote Intact IQ: Average Insight: Poor Impulse Control: Poor Appetite: Good Weight Loss: 0  Weight Gain: 0  Sleep: Decreased Total Hours of Sleep: 5  Vegetative Symptoms:  None  ADLScreening [facility] Assessment Services) Patient's cognitive ability adequate to safely complete daily activities?: Yes Patient able to express need for assistance with ADLs?: Yes Independently performs ADLs?: Yes  Abuse/Neglect [facility]) Physical Abuse: Denies Verbal Abuse: Denies Sexual Abuse: Denies  Prior Inpatient Therapy Prior Inpatient Therapy: Yes Prior Therapy Dates: 07/2011 Prior Therapy Facilty/Provider(s): [facility] Reason for Treatment: detox and depression  Prior Outpatient Therapy Prior Outpatient Therapy: Yes Prior Therapy Dates: ongoing Prior Therapy Facilty/Provider(s): [facility] Reason for Treatment: medication management  ADL Screening (condition at time of admission) Patient's cognitive ability adequate to safely complete daily activities?: Yes Patient able to express need for assistance with ADLs?: Yes Independently performs ADLs?: Yes Weakness of Legs: None Weakness of Arms/Hands: None  Home Assistive Devices/Equipment Home Assistive Devices/Equipment: None    Abuse/Neglect Assessment (Assessment to be complete while patient is alone) Physical Abuse: Denies Verbal Abuse: Denies Sexual Abuse: Denies Exploitation of patient/patient's resources: Denies Self-Neglect: Denies Values / Beliefs Cultural Requests During Hospitalization: None Spiritual Requests During Hospitalization: None   Advance Directives (For Healthcare) Advance Directive: Patient does not have advance directive;Patient would not like information Pre-existing out of facility DNR order (yellow form or pink MOST form): No Nutrition Screen Diet: Regular Unintentional weight loss greater than 10lbs within the last month: No Problems chewing or swallowing foods and/or liquids: No Home Tube Feeding or Total Parenteral Nutrition (TPN): No Patient appears severely malnourished: No  Additional Information 1:1 In Past 12 Months?: No CIRT Risk: No Elopement Risk: No Does patient have medical  clearance?: Yes     Disposition:  Disposition Disposition of Patient: Referred to;Inpatient treatment program Type of inpatient treatment program: Adult Patient referred to:  [facility])  On Site Evaluation  by:   Reviewed with Physician:   Pt has been accepted at [facility] by dr. Theotis Barrio to attending Dr. Koren Shiver, Rm [id number]. EDP notified and agrees with plan. Pt also notified and agrees with plan. Support paperwork gathered and faxed to [facility].   Georgina Quint A 11/13/2011 8:44 PM

## 2011-11-13 NOTE — ED Provider Notes (Signed)
  Physical Exam  BP 121/80  Pulse 73  Temp 98.1 F (36.7 C) (Oral)  Resp 18  SpO2 100%  Physical Exam  ED Course  Procedures  MDM Accepted at [facility] Dr Theotis Barrio.      Juliet Rude. Rubin Payor, MD 11/13/11 [phone]

## 2011-11-13 NOTE — ED Notes (Signed)
Safety sitter has remained at bedside throughout morning, with pt in full view.

## 2011-11-13 NOTE — ED Notes (Signed)
WUJ:[id number]<JY> Expected date:11/12/11<BR> Expected time: 9:09 AM<BR> Means of arrival:<BR> Comments:<BR> closed

## 2011-11-13 NOTE — ED Provider Notes (Signed)
Medical screening examination/treatment/procedure(s) were performed by non-physician practitioner and as supervising physician I was immediately available for consultation/collaboration.   Hanley Seamen, MD 11/13/11 [phone]

## 2011-11-13 NOTE — ED Notes (Signed)
Per pt's family - pt is an alcoholic and has been having increased depression and suicidal ideation. Pt arouses to sternal rub on assessment. Pt airway intact, handling secretions.

## 2011-11-13 NOTE — ED Provider Notes (Signed)
History     CSN: [id number]  Arrival date & time 11/13/11  0008   First MD Initiated Contact with Patient 11/13/11 0047      Chief Complaint  Patient presents with  . Suicidal  . Alcohol Intoxication    (Consider location/radiation/quality/duration/timing/severity/associated sxs/prior treatment) HPI  Patient has been brought to the emergency department for alcohol intoxication. The patient according to the family has been increasingly becoming more depressed with suicide ideation. Upon arriving to the emergency department the nurses informed us that the patient was lethargic due to intoxication however arousable to sternal rub. Upon examining the patient myself he is sleepy however when awoken he can tell me his name, birth state, age. He tells me that he drank as much much, then told that his wife doesn't like that. He then falls asleep. He is hemodynamically stable at this time. His airway has been maintained as he is handling his secretions well.  Past Medical History  Diagnosis Date  . Depression   . Alcoholic     History reviewed. No pertinent past surgical history.  History reviewed. No pertinent family history.  History  Substance Use Topics  . Smoking status: Current Everyday Smoker -- 1.5 packs/day    Types: Cigarettes  . Smokeless tobacco: Not on file  . Alcohol Use: Yes     1/2 gallon of liquor daily      Review of Systems  Unable due to intoxication  Allergies  Nutritional supplements  Home Medications   Current Outpatient Rx  Name Route Sig Dispense Refill  . CITALOPRAM HYDROBROMIDE 20 MG PO TABS Oral Take 1 tablet (20 mg total) by mouth daily. 30 tablet 0  . LISINOPRIL 10 MG PO TABS Oral Take 1 tablet (10 mg total) by mouth daily. 30 tablet 0    BP 102/64  Pulse 76  Temp 97.8 F (36.6 C) (Oral)  Resp 14  SpO2 93%  Physical Exam  Nursing note and vitals reviewed. Constitutional: He appears well-developed and well-nourished. No distress.    HENT:  Head: Normocephalic and atraumatic.  Eyes: Pupils are equal, round, and reactive to light.  Neck: Normal range of motion. Neck supple.  Cardiovascular: Normal rate and regular rhythm.   Pulmonary/Chest: Effort normal.  Abdominal: Soft.  Neurological: He is alert.  Skin: Skin is warm and dry.    ED Course  Procedures (including critical care time)  Labs Reviewed  URINE RAPID DRUG SCREEN (HOSP PERFORMED) - Abnormal; Notable for the following:    Opiates POSITIVE (*)     All other components within normal limits  BASIC METABOLIC PANEL - Abnormal; Notable for the following:    GFR calc non Af Amer 90 (*)     All other components within normal limits  ETHANOL - Abnormal; Notable for the following:    Alcohol, Ethyl (B) 269 (*)     All other components within normal limits  URINALYSIS, ROUTINE W REFLEX MICROSCOPIC  CBC   No results found.   1. Suicidal ideation   2. Alcohol intoxication       MDM  Patient labs drawn  Will sober up patient in ED and have ACT evaluate in the morning.  Psych Hold Orders written Alcohol withdrawal orders written   ACT has been informed about patient    Dorthula Matas, [place] 11/13/11 [phone]

## 2011-11-13 NOTE — ED Notes (Signed)
IV Removed  

## 2011-11-13 NOTE — ED Provider Notes (Signed)
Medical screening examination/treatment/procedure(s) were performed by non-physician practitioner and as supervising physician I was immediately available for consultation/collaboration.   Lyanne Co, MD 11/13/11 [phone]

## 2011-11-13 NOTE — ED Notes (Signed)
NP at bedside. Tele psych ordered for pt to r/o SI. RN informed by NP.

## 2011-11-13 NOTE — ED Notes (Signed)
Pt moved to [facility]-ED. Mike,RN assumed care. Pt ambulatory, cooperative. Waiting for Tele Psych recommendation to arrive for POC/dispotion

## 2011-11-14 ENCOUNTER — Inpatient Hospital Stay (HOSPITAL_COMMUNITY)
Admission: AD | Admit: 2011-11-14 | Discharge: 2011-11-17 | DRG: 897 | Disposition: A | Discharge status: Home / Self Care | Payer: Federal, State, Local not specified - Other | Source: Ambulatory Visit | Attending: Psychiatry | Admitting: Psychiatry

## 2011-11-14 ENCOUNTER — Encounter (HOSPITAL_COMMUNITY): Payer: Self-pay | Admitting: *Deleted

## 2011-11-14 DIAGNOSIS — F10239 Alcohol dependence with withdrawal, unspecified: Principal | ICD-10-CM | POA: Diagnosis present

## 2011-11-14 DIAGNOSIS — F1994 Other psychoactive substance use, unspecified with psychoactive substance-induced mood disorder: Secondary | ICD-10-CM

## 2011-11-14 DIAGNOSIS — F1110 Opioid abuse, uncomplicated: Secondary | ICD-10-CM | POA: Diagnosis present

## 2011-11-14 DIAGNOSIS — T50904A Poisoning by unspecified drugs, medicaments and biological substances, undetermined, initial encounter: Secondary | ICD-10-CM

## 2011-11-14 DIAGNOSIS — F1020 Alcohol dependence, uncomplicated: Secondary | ICD-10-CM | POA: Diagnosis present

## 2011-11-14 DIAGNOSIS — F10939 Alcohol use, unspecified with withdrawal, unspecified: Principal | ICD-10-CM | POA: Diagnosis present

## 2011-11-14 DIAGNOSIS — F10988 Alcohol use, unspecified with other alcohol-induced disorder: Secondary | ICD-10-CM | POA: Diagnosis present

## 2011-11-14 HISTORY — DX: Essential (primary) hypertension: I10

## 2011-11-14 MED ORDER — CITALOPRAM HYDROBROMIDE 20 MG PO TABS
20.0000 mg | ORAL_TABLET | Freq: Every day | ORAL | Status: DC
  Administered 2011-11-14 – 2011-11-17 (×4): 20 mg via ORAL
  Filled 2011-11-14 (×7): qty 1

## 2011-11-14 MED ORDER — CHLORDIAZEPOXIDE HCL 25 MG PO CAPS: 25.0000 mg | ORAL_CAPSULE | Freq: Four times a day (QID) | ORAL | Status: AC | PRN

## 2011-11-14 MED ORDER — TRAZODONE HCL 50 MG PO TABS: 50.0000 mg | ORAL_TABLET | Freq: Every evening | ORAL | Status: DC | PRN

## 2011-11-14 MED ORDER — CHLORDIAZEPOXIDE HCL 25 MG PO CAPS
25.0000 mg | ORAL_CAPSULE | ORAL | Status: AC
  Administered 2011-11-16 (×2): 25 mg via ORAL
  Filled 2011-11-14 (×2): qty 1

## 2011-11-14 MED ORDER — ALUM & MAG HYDROXIDE-SIMETH 200-200-20 MG/5ML PO SUSP: 30.0000 mL | ORAL | Status: DC | PRN

## 2011-11-14 MED ORDER — ADULT MULTIVITAMIN W/MINERALS CH
1.0000 | ORAL_TABLET | Freq: Every day | ORAL | Status: DC
  Administered 2011-11-14 – 2011-11-17 (×4): 1 via ORAL
  Filled 2011-11-14 (×6): qty 1

## 2011-11-14 MED ORDER — NICOTINE 21 MG/24HR TD PT24
21.0000 mg | MEDICATED_PATCH | Freq: Every day | TRANSDERMAL | Status: DC
  Administered 2011-11-14 – 2011-11-17 (×4): 21 mg via TRANSDERMAL
  Filled 2011-11-14 (×7): qty 1

## 2011-11-14 MED ORDER — THIAMINE HCL 100 MG/ML IJ SOLN
100.0000 mg | Freq: Once | INTRAMUSCULAR | Status: AC
  Administered 2011-11-14: 100 mg via INTRAMUSCULAR

## 2011-11-14 MED ORDER — ONDANSETRON 4 MG PO TBDP: 4.0000 mg | ORAL_TABLET | Freq: Four times a day (QID) | ORAL | Status: AC | PRN

## 2011-11-14 MED ORDER — ACETAMINOPHEN 325 MG PO TABS: 650.0000 mg | ORAL_TABLET | Freq: Four times a day (QID) | ORAL | Status: DC | PRN

## 2011-11-14 MED ORDER — CHLORDIAZEPOXIDE HCL 25 MG PO CAPS
25.0000 mg | ORAL_CAPSULE | Freq: Four times a day (QID) | ORAL | Status: AC
  Administered 2011-11-14 (×3): 25 mg via ORAL
  Filled 2011-11-14 (×4): qty 1

## 2011-11-14 MED ORDER — CHLORDIAZEPOXIDE HCL 25 MG PO CAPS
25.0000 mg | ORAL_CAPSULE | Freq: Three times a day (TID) | ORAL | Status: AC
  Administered 2011-11-15 (×3): 25 mg via ORAL
  Filled 2011-11-14 (×3): qty 1

## 2011-11-14 MED ORDER — HYDROXYZINE HCL 25 MG PO TABS
25.0000 mg | ORAL_TABLET | Freq: Four times a day (QID) | ORAL | Status: AC | PRN
  Administered 2011-11-14 – 2011-11-15 (×2): 25 mg via ORAL

## 2011-11-14 MED ORDER — VITAMIN B-1 100 MG PO TABS
100.0000 mg | ORAL_TABLET | Freq: Every day | ORAL | Status: DC
  Administered 2011-11-15 – 2011-11-17 (×3): 100 mg via ORAL
  Filled 2011-11-14 (×5): qty 1

## 2011-11-14 MED ORDER — MAGNESIUM HYDROXIDE 400 MG/5ML PO SUSP: 30.0000 mL | Freq: Every day | ORAL | Status: DC | PRN

## 2011-11-14 MED ORDER — THIAMINE HCL 100 MG/ML IJ SOLN: 100.0000 mg | Freq: Once | INTRAMUSCULAR | Status: DC

## 2011-11-14 MED ORDER — VITAMIN B-1 100 MG PO TABS: 100.0000 mg | ORAL_TABLET | Freq: Every day | ORAL | Status: DC

## 2011-11-14 MED ORDER — TRAZODONE HCL 100 MG PO TABS
100.0000 mg | ORAL_TABLET | Freq: Every day | ORAL | Status: DC
  Administered 2011-11-14 – 2011-11-16 (×3): 100 mg via ORAL
  Filled 2011-11-14 (×6): qty 1

## 2011-11-14 MED ORDER — CHLORDIAZEPOXIDE HCL 25 MG PO CAPS
25.0000 mg | ORAL_CAPSULE | Freq: Every day | ORAL | Status: AC
  Administered 2011-11-17: 25 mg via ORAL
  Filled 2011-11-14: qty 1

## 2011-11-14 MED ORDER — LOPERAMIDE HCL 2 MG PO CAPS
2.0000 mg | ORAL_CAPSULE | ORAL | Status: AC | PRN
Start: 2011-11-14 — End: 2011-11-17

## 2011-11-14 MED ORDER — ADULT MULTIVITAMIN W/MINERALS CH
1.0000 | ORAL_TABLET | Freq: Every day | ORAL | Status: DC
  Administered 2011-11-14: 1 via ORAL
  Filled 2011-11-14 (×2): qty 1

## 2011-11-14 NOTE — Progress Notes (Signed)
Pt. Denies SI/HI and denies A/V hallucinations.  Reports that he is "feeling better".  Denies pain.  Encouragement and support given.
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
## 2011-11-14 NOTE — Progress Notes (Signed)
[facility] Group Notes:  (Counselor/Nursing/MHT/Case Management/Adjunct)  11/14/2011 9:53 PM  Type of Therapy:  Group Therapy at 11 AM  Participation Level:  Active  Participation Quality:  Sharing  Affect:  Appropriate  Cognitive:  Oriented  Insight:  Limited  Engagement in Group:  Good  Engagement in Therapy:  Limited  Modes of Intervention:  Clarification, Socialization and Support  Summary of Progress/Problems: Patient attended first group therapy session of admit and shared he began using beer soon after last detox here at [facility], which "was a big change from the liquor I was drinking before that detox." I did drink one bottle of liquor last night and look where I am now."  Other's in group laughed along with patient and offered support. "My emotions are something I rarely express but I do use alcohol to numb."   [name] 11/14/2011, 9:53 PM

## 2011-11-14 NOTE — Progress Notes (Signed)
[facility] Group Notes:  (Counselor/Nursing/MHT/Case Management/Adjunct)  11/14/2011 10:02 PM  Type of Therapy:  Group Therapy at 1:15  Participation Level:  Did Not Attend    [name] 11/14/2011, 10:02 PM

## 2011-11-14 NOTE — Discharge Planning (Signed)
Found Lucas Small in bed, asleep, but he awoke and was willing to talk.  Says he left here to go to [facility] after last d/c.  Was there for 2-3 nights.  Missed curfew because he became disoriented secondary to panic attack and could not find his way back to apartment.  Was dismissed from program.  Called a friend to see if she would pick him up.  They got married a month later.  He is here for detox.  Plans to return home and "stay busy."  Does not attend AA.  Has no support group.  Insight limited.

## 2011-11-14 NOTE — Progress Notes (Signed)
D: Pt states he slept fair, appetite is good, energy level is low, focus is poor. Pt rates his depression and hopelessness at a 7, with 10 being the worst feeling. Pt c/o tremors. Pt has passive SI. A: Pt contracts for safety. Medication started. 15 minute checks. R: Safety maintained, to continue VS/CIWAS, emotional support given.

## 2011-11-14 NOTE — Progress Notes (Signed)
D: Pt states he slept fair, appetite is good, energy level is low, focus is poor. Pt rates his depression and hopelessness as a 7, with 10 feeling the worst. Pt has passive SI, "off and on". Pt c/o dizziness, feeling lightheaded and having pain. A: Pt is on Librium protocol, VS and CIWA done, pt encouraged to drink fluids and to address medication issues with MD. Emotional support given. R: Pt looks tired, some slight tremors, was at [facility] in March. Pt contracts for safety, safety maintained.
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
## 2011-11-14 NOTE — Tx Team (Signed)
Initial Interdisciplinary Treatment Plan  PATIENT STRENGTHS: (choose at least two) Ability for insight Average or above average intelligence Capable of independent living Communication skills General fund of knowledge Motivation for treatment/growth Physical Health Supportive family/friends Work skills  PATIENT STRESSORS: Financial difficulties Marital or family conflict Substance abuse   PROBLEM LIST: Problem List/Patient Goals Date to be addressed Date deferred Reason deferred Estimated date of resolution  "I guess being able to quit drinking and quit smoking" 11/14/11     "Medicines to be adjusted to help me make more rational choices" 11/14/11     Depression 11/14/11     Substance abuse 11/14/11     Increased risk for suicide 11/14/11                              DISCHARGE CRITERIA:  Ability to meet basic life and health needs Adequate post-discharge living arrangements Improved stabilization in mood, thinking, and/or behavior Medical problems require only outpatient monitoring Motivation to continue treatment in a less acute level of care Need for constant or close observation no longer present Reduction of life-threatening or endangering symptoms to within safe limits Safe-care adequate arrangements made Verbal commitment to aftercare and medication compliance Withdrawal symptoms are absent or subacute and managed without 24-hour nursing intervention  PRELIMINARY DISCHARGE PLAN: Attend 12-step recovery group Outpatient therapy Participate in family therapy Return to previous living arrangement  PATIENT/FAMIILY INVOLVEMENT: This treatment plan has been presented to and reviewed with the patient, Lucas Small, and/or family member.  The patient and family have been given the opportunity to ask questions and make suggestions.  Fransico Chord Laverne 11/14/2011, 1:35 AM

## 2011-11-14 NOTE — Progress Notes (Signed)
Adult Psychosocial Assessment Update Interdisciplinary Team  Previous [facility] admissions/discharges:  Admissions Discharges  Date:    08/23/11 Date:    08/28/11  Date: Date:  Date: Date:  Date: Date:  Date: Date:   Changes since the last Psychosocial Assessment (including adherence to outpatient mental health and/or substance abuse treatment, situational issues contributing to decompensation and/or relapse).   Patient reports things have much improved in his marriage and relationship since he     Went through detox here in March of this year. The patient and wife have recently   moved to [place]. Pt reports he began drinking alcohol in beer form verses liquor   and "I think I was able to handle it okay until I drank the liquor."  Other stressors include    Finances and caring for wife.        Discharge Plan 1. Will you be returning to the same living situation after discharge?   Yes: X No:      If no, what is your plan?           2. Would you like a referral for services when you are discharged? Yes:   X  If yes, for what services?  No:         Need a closer location to [facility] than [facility] and Recommendations (to be completed by the evaluator)   Patient is a 46 YO married caucasian unemployeed male admitted with diagnosis of   Biploar Disorder, unspecified and Alcohol Dependence.  Patient will benefit from crisis stabilization, medication evaluation, group therapy and psycho ed groups, in addition to case management for discharge planning.                        Signature:  [name], 11/14/2011 3:21 PM

## 2011-11-14 NOTE — Progress Notes (Signed)
Patient ID: Lucas Small, male   DOB: 1965-10-14, 46 y.o.   MRN: [id number] Patient was pleasant and cooperative, but guarded during the assessment. Report states pt made a suicidal comment to his wife. However, pt states he was drunk and doesn't remember. Pt was last at [facility] in March 2013.  Pt states he's "down to 1/5 of liquor daily".  Lost his job 2 yrs ago and takes care of his wife who has a chronic illness. Report states increased anxiety about the future. Also because food stamps hasn't come.

## 2011-11-14 NOTE — Progress Notes (Addendum)
D:Patient cooperative but states that he is tired this evening. Patient denies SI/HI and denies AVH.  Patient denies pain patient complains of depression. Patient brief with interview with Clinical research associate. A: Patient offered encouragement and support. Staff to monitor Q for safety.  R: Patient pleasant, patient compliant with medications. Safety maintained on the unit.

## 2011-11-15 NOTE — Treatment Plan (Signed)
Interdisciplinary Treatment Plan Update (Adult)  Date: 11/15/2011  Time Reviewed: 3:55 PM   Progress in Treatment: Attending groups: Yes Participating in groups: Yes Taking medication as prescribed: Yes Tolerating medication: Yes   Family/Significant other contact made:  Yes Patient understands diagnosis:  Yes  As evidenced by asking for help with alcohol withdrawal Discussing patient identified problems/goals with staff:  Yes  See below Medical problems stabilized or resolved:  Yes Denies suicidal/homicidal ideation: Yes  In AM group Issues/concerns per patient self-inventory:  Yes  Depression and hopelessness are 7's  Cravings, lightheadedness and dizziness Other:  New problem(s) identified: N/A  Reason for Continuation of Hospitalization: Medication stabilization Withdrawal symptoms  Interventions implemented related to continuation of hospitalization:   Additional comments:  Estimated length of stay:  Discharge Plan:  New goal(s): N/A  Review of initial/current patient goals per problem list:   1.  Goal(s): Safely detox from alcohol  Met:  No  Target date:6/21  As evidenced by:Stable vitals, now withdrawal symptoms  2.  Goal (s):Get into [facility] from here  Met:  No  Target date:6/21  As evidenced by: Confirmation of bed  3.  Goal(s):  Met:  No  Target date:  As evidenced by:  4.  Goal(s):  Met:  No  Target date:  As evidenced by:  Attendees: Patient:     Family:     Physician:  Lucas Small 11/15/2011 3:55 PM   Nursing:    11/15/2011 3:55 PM   Case Manager:  Lucas Ito, LCSW 11/15/2011 3:55 PM   Counselor:   11/15/2011 3:55 PM   Other:     Other:     Other:     Other:      Scribe for Treatment Team:   Lucas Small, 11/15/2011 3:55 PM
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
## 2011-11-15 NOTE — Discharge Planning (Signed)
Today in AM group Lucas Small reports that he would like to go to rehab at [facility].  Explained the process.  Will talk to Dr to see if possible to refer tomorrow.

## 2011-11-15 NOTE — Progress Notes (Signed)
11/15/2011         Time: 1415      Group Topic/Focus: The focus of this group is on discussing various styles of communication and communicating assertively using 'I' (feeling) statements.   Participation Level: Active  Participation Quality: Appropriate and Redirectable  Affect: Appropriate  Cognitive: Oriented  Additional Comments: Patient required some redirection for sarcastic comments, but was otherwise appropriate.  Caedan Sumler 11/15/2011 3:46 PM
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
## 2011-11-15 NOTE — Progress Notes (Signed)
[facility] MD Progress Note  11/15/2011 3:26 PM  S: "I'm doing pretty good. I feel sweaty at night. A little tremor during day. It's not much that I can't handle. My mood is so - so. My depression is at #7. Sleep and my appetite is good".  Diagnosis:   Axis I: Substance Induced Mood Disorder and Alcohol dependency Axis II: Deferred Axis III:  Past Medical History  Diagnosis Date  . Depression   . Alcoholic   . Hypertension    Axis IV: other psychosocial or environmental problems Axis V: 51-60 moderate symptoms  ADL's:  Intact  Sleep: Good  Appetite:  Good  Suicidal Ideation:  Plan:  No Intent:  No Means:  No Homicidal Ideation:  Plan:  No Intent:  No Means:  No  AEB (as evidenced by): per patient reports.  Mental Status Examination/Evaluation: Objective:  Appearance: Casual  Eye Contact::  Good  Speech:  Clear and Coherent  Volume:  Normal  Mood:  Euthymic  Affect:  Appropriate  Thought Process:  Coherent  Orientation:  Full  Thought Content:  Rumination  Suicidal Thoughts:  No  Homicidal Thoughts:  No  Memory:  Immediate;   Good Recent;   Good Remote;   Good  Judgement:  Fair  Insight:  Fair  Psychomotor Activity:  Normal  Concentration:  Fair  Recall:  Good  Akathisia:  No  Handed:  Right  AIMS (if indicated):     Assets:  Desire for Improvement  Sleep:  Number of Hours: 6.25    Vital Signs:Blood pressure 94/65, pulse 97, temperature 97.6 F (36.4 C), temperature source Oral, resp. rate 16, height 5' 3.5" (1.613 m), weight 81.194 kg (179 lb). Current Medications: Current Facility-Administered Medications  Medication Dose Route Frequency Provider Last Rate Last Dose  . acetaminophen (TYLENOL) tablet 650 mg  650 mg Oral Q6H PRN Wonda Cerise, MD      . alum & mag hydroxide-simeth (MAALOX/MYLANTA) 200-200-20 MG/5ML suspension 30 mL  30 mL Oral Q4H PRN Wonda Cerise, MD      . chlordiazePOXIDE (LIBRIUM) capsule 25 mg  25 mg Oral Q6H PRN Wonda Cerise, MD      .  chlordiazePOXIDE (LIBRIUM) capsule 25 mg  25 mg Oral QID Alyson Kuroski-Mazzei, DO   25 mg at 11/14/11 2154   Followed by  . chlordiazePOXIDE (LIBRIUM) capsule 25 mg  25 mg Oral TID Alyson Kuroski-Mazzei, DO   25 mg at 11/15/11 1156   Followed by  . chlordiazePOXIDE (LIBRIUM) capsule 25 mg  25 mg Oral BH-qamhs Alyson Kuroski-Mazzei, DO       Followed by  . chlordiazePOXIDE (LIBRIUM) capsule 25 mg  25 mg Oral Daily Alyson Kuroski-Mazzei, DO      . citalopram (CELEXA) tablet 20 mg  20 mg Oral Daily Alyson Kuroski-Mazzei, DO   20 mg at 11/15/11 0830  . hydrOXYzine (ATARAX/VISTARIL) tablet 25 mg  25 mg Oral Q6H PRN Wonda Cerise, MD   25 mg at 11/14/11 1117  . loperamide (IMODIUM) capsule 2-4 mg  2-4 mg Oral PRN Wonda Cerise, MD      . magnesium hydroxide (MILK OF MAGNESIA) suspension 30 mL  30 mL Oral Daily PRN Wonda Cerise, MD      . multivitamin with minerals tablet 1 tablet  1 tablet Oral Daily Alyson Kuroski-Mazzei, DO   1 tablet at 11/15/11 0830  . nicotine (NICODERM CQ - dosed in mg/24 hours) patch 21 mg  21 mg Transdermal Q0600 Alyson Kuroski-Mazzei, DO  21 mg at 11/15/11 0637  . ondansetron (ZOFRAN-ODT) disintegrating tablet 4 mg  4 mg Oral Q6H PRN Wonda Cerise, MD      . thiamine (VITAMIN B-1) tablet 100 mg  100 mg Oral Daily Wonda Cerise, MD   100 mg at 11/15/11 0829  . traZODone (DESYREL) tablet 100 mg  100 mg Oral QHS Alyson Kuroski-Mazzei, DO   100 mg at 11/14/11 2154    Lab Results: No results found for this or any previous visit (from the past 48 hour(s)).  Physical Findings: AIMS:  , ,  ,  ,    CIWA:  CIWA-Ar Total: 0  COWS:     Treatment Plan Summary: Daily contact with patient to assess and evaluate symptoms and progress in treatment Medication management  Plan: Continue current treatment plan  Armandina Stammer I 11/15/2011, 3:26 PM

## 2011-11-16 NOTE — Progress Notes (Addendum)
D: Pt is in bed resting with eyes closed. Pt appears to be in no signs of distress at this time. Pt is heard snoring loudly.  A: Writer will continue to monitor pt. Q6min checks remains.  R: Pt remains safe at this time.

## 2011-11-16 NOTE — Progress Notes (Signed)
[facility] Case Management Discharge Plan:  Will you be returning to the same living situation after discharge: Yes,  home At discharge, do you have transportation home?:Yes,  wife Do you have the ability to pay for your medications:Yes,  mental health  Interagency Information:     Release of information consent forms completed and in the chart;  Patient's signature needed at discharge.  Patient to Follow up at:  Follow-up Information    Follow up with [facility] on 11/19/2011. (1:30)    Contact information:   [address],  [place], [place]  [phone]         Patient denies SI/HI:   Yes,  yes    Aeronautical engineer and Suicide Prevention discussed:  Yes,  yes  Barrier to discharge identified:No.  Summary and Recommendations:   Lucas Small 11/16/2011, 4:17 PM

## 2011-11-16 NOTE — Progress Notes (Signed)
Patient ID: Lucas Small, male   DOB: 1966-05-23, 46 y.o.   MRN: [id number] Pt is awake and active on the unit this AM. Pt denies SI/HI and A/V hallucinations. Pt is participating in the milieu and is cooperative with staff. Pt writes that he plans to listen more to his wife after discharge, and pt also states that he plans to remain sober. Writer encouraged participation in [place]. Writer will continue to monitor.

## 2011-11-16 NOTE — Progress Notes (Signed)
[facility] Adult Inpatient Family/Significant Other Suicide Prevention Education  Suicide Prevention Education:  Education Completed Susumu Hackler at [phone] has been identified by the patient as the family member/significant other with whom the patient will be residing, and identified as the person(s) who will aid the patient in the event of a mental health crisis (suicidal ideations/suicide attempt).  With written consent from the patient, the family member/significant other has been provided the following suicide prevention education, prior to the and/or following the discharge of the patient.  The suicide prevention education provided includes the following:  Suicide risk factors  Suicide prevention and interventions  National Suicide Hotline telephone number  [facility] assessment telephone number  [facility] Emergency Assistance 911  [facility] and/or Residential Mobile Crisis Unit telephone number [phone]  Request made of family/significant other to:  Remove weapons (e.g., guns, rifles, knives), all items previously/currently identified as safety concern.    Remove drugs/medications (over-the-counter, prescriptions, illicit drugs), all items previously/currently identified as a safety concern.  Alvino Chapel states there are no firearms not alcohol in the home  The family member/significant other verbalizes understanding of the suicide prevention education information provided.  The family member/significant other agrees to remove the items of safety concern listed above.  [name] 11/16/2011, 10:46 AM

## 2011-11-16 NOTE — Progress Notes (Signed)
[facility] Group Notes:  (Counselor/Nursing/MHT/Case Management/Adjunct)  11/16/2011 5:42 PM  Type of Therapy:  Group Therapy at 11  Participation Level:  Active  Participation Quality:  Intrusive and Sharing  Affect:  Irritable  Cognitive:  Alert and Oriented  Insight:  Limited  Engagement in Group:  Good  Engagement in Therapy:  Good  Modes of Intervention:  Education, Limit-setting, Socialization and Support  Summary of Progress/Problems:  Lucas Small  was attentive to discussion on Post Acute Withdrawal Syndrome (PAWS).  He took issue with many points and made several unnecessarily rude comments but responded well to limit setting.    [facility] Group Notes:  (Counselor/Nursing/MHT/Case Management/Adjunct)  11/16/2011 5:42 PM  Type of Therapy:  Group Therapy at 1:15  Participation Level:  Minimal  Participation Quality:  Inattentive  Affect:  Irritable  Cognitive:  Alert and Oriented  Insight:  None shared  Engagement in Group:  Limited  Engagement in Therapy:  Limited  Modes of Intervention:  Socialization and Support  Summary of Progress/Problems:  Lucas Small was in attendance for majority of group session, inattentive and  did leave group early stating "vulnerability sucks"   [name] 11/16/2011, 5:42 PM

## 2011-11-17 DIAGNOSIS — F1020 Alcohol dependence, uncomplicated: Secondary | ICD-10-CM

## 2011-11-17 DIAGNOSIS — F1994 Other psychoactive substance use, unspecified with psychoactive substance-induced mood disorder: Secondary | ICD-10-CM

## 2011-11-17 MED ORDER — NICOTINE 21 MG/24HR TD PT24: 1.0000 | MEDICATED_PATCH | Freq: Every day | TRANSDERMAL | Status: AC
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
## 2011-11-17 NOTE — Progress Notes (Signed)
Patient ID: Lucas Small, male   DOB: July 28, 1965, 46 y.o.   MRN: [id number] D: Pt. In bed, eyes closed, no distress noted. A: Writer monitored client for s/s of detox and resp. Distress. R: Safety will be maintained by checks q34min.

## 2011-11-17 NOTE — Progress Notes (Signed)
Patient ID: Lucas Small, male   DOB: 1966-01-22, 46 y.o.   MRN: [id number] Pt discharged to wife this afternoon, pt denies SI/HI, pt provided with prescription and supply of nicotine patches, discharge instructions given and pt verbalized understanding, all belongings returned

## 2011-11-17 NOTE — Progress Notes (Signed)
Patient ID: Lucas Small, male   DOB: [birth year], 46 y.o.   MRN: [id number]  Subjective: Lucas Small reports that he feels he has been safely medically detoxed, and he denies any further symptoms of withdrawal from alcohol. He feels ready for discharge, and plans to attend an AA meeting today, which happens to be his birthday, when he arrives home in [place]. He denies any suicidal or homicidal ideation. He denies any auditory or visual hallucinations.  Objective: Lucas Small presents as a mildly anxious, and rather talkative but pleasant man, who is in no acute distress, and is fully alert and oriented. He shows no tremor or other sign of withdrawal. He gives no indication that would raise concern regarding his safety for himself or others.  Assessment and Plan: Lucas Small is to be discharged home today. He reports his wife will take him up between 1 and 1:30 this afternoon. He is to continue his medications as indicated in his discharge instructions. He is encouraged to attend AA to address his alcoholism.

## 2011-11-17 NOTE — Progress Notes (Signed)
Patient ID: Lucas Small, male   DOB: [birth year], 46 y.o.   MRN: [id number]  Pt. attended and participated in aftercare planning group. Pt. verbally accepted information on suicide prevention, warning signs to look for with suicide and crisis line numbers to use. Pt. listed their current anxiety level as 2 and their current depression level as 2. Pt. is set to discharge today and would like some type of documentation for Social Security that represents hospital stay and medications.

## 2011-11-19 NOTE — Progress Notes (Signed)
Patient Discharge Instructions:  After Visit Summary (AVS):   Faxed to:  11/19/2011 Psychiatric Admission Assessment Note:   Faxed to:  11/19/2011 Suicide Risk Assessment - Discharge Assessment:   Faxed to:  11/19/2011 Faxed/Sent to the Next Level Care provider:  11/19/2011  Faxed to [facility] @ [phone]  Heloise Purpura, Eduard Clos, 11/19/2011, 4:53 PM

## 2011-12-02 NOTE — Discharge Summary (Signed)
Physician Discharge Summary Note  Patient:  Lucas Small is an 46 y.o., male MRN:  [id number] DOB:  1965/12/09 Patient phone:  [phone] (home)  Patient address:   [address] [place] [address],   Date of Admission:  11/14/2011 Date of Discharge: 11/17/2011  Reason for Admission: alcohol addiction                                         Substance abuse  Discharge Diagnoses: Active Problems:  * No active hospital problems. *    Axis Diagnosis:  Discharge Diagnoses:  AXIS I: Substance Induced Mood Disorder and alcohol dependence  AXIS II: Cluster B Traits  AXIS III:  Past Medical History   Diagnosis  Date   .  Depression    .  Alcoholic    .  Hypertension     AXIS IV: economic problems, educational problems, occupational problems, other psychosocial or environmental problems, problems related to social environment and problems with access to health care services  AXIS V: 51-60 moderate symptoms  Level of Care:  Out patient  Hospital Course:  The patient was admitted to the unit for stabilization and detox from alcohol. This is his second admission in 3 months.  He was started on Librium detox protocol and supportive medication.  His response to detox was monitored with daily CIWA scores. Lucas Small was seen daily by clinical providers.  He was also asked to complete a daily self inventory to assess his mental and emotional status.  Lucas Small's depressive symptoms were treated with Celexa and his mood disorder was treated with Lamictal.  His blood pressure was treated with lisinopril and for sleep he was given trazodone.  Lucas Small was encouraged to participate in daily therapy groups on the unit and was also followed by a Case manager, and a clinical Child psychotherapist.  He was offered residential treatment upon completion of his detox, but elected to return home with his wife.  On discharge he was in stable condition medically and in much improved condition than upon  arrival.  Consults:  None  Significant Diagnostic Studies:  None  Discharge Vitals:   Blood pressure 118/74, pulse 121, temperature 97.7 F (36.5 C), temperature source Oral, resp. rate 16, height 5' 3.5" (1.613 m), weight 81.194 kg (179 lb).  Mental Status Exam: See Mental Status Examination and Suicide Risk Assessment completed by Attending Physician prior to discharge.  Discharge destination:  Home Is patient on multiple antipsychotic therapies at discharge:  No   Has Patient had three or more failed trials of antipsychotic monotherapy by history:  No Recommended Plan for Multiple Antipsychotic Therapies: not applicable   Discharge Orders    Future Orders Please Complete By Expires   Discharge instructions        Medication List  As of 12/02/2011  4:45 PM   TAKE these medications      Indication    citalopram 20 MG tablet   Commonly known as: CELEXA   Take 1 tablet (20 mg total) by mouth daily.    Indication: Excessive Use of Alcohol, Depression, Social Anxiety Disorder      lamoTRIgine 100 MG tablet   Commonly known as: LAMICTAL   Take 100 mg by mouth daily. Take 1/2 tab daily       lisinopril 10 MG tablet   Commonly known as: PRINIVIL,ZESTRIL   Take 1 tablet (10  mg total) by mouth daily.    Indication: High Blood Pressure      nicotine 21 mg/24hr patch   Commonly known as: NICODERM CQ - dosed in mg/24 hours   Place 1 patch onto the skin daily at 6 (six) AM. For smoking cessation       traZODone 100 MG tablet   Commonly known as: DESYREL   Take 100 mg by mouth at bedtime.            Follow-up Information    Follow up with [facility] on 11/19/2011. (1:30)    Contact information:   [address],  [place], [place]  [phone]         Follow-up recommendations:  Eat a heart healthy diet.  Exercise daily as tolerated. Get plenty of rest.    Comments:  Lucas Small's lack of insight into his alcoholism increases his likelihood for relapse and  readmission.  Signed: Rona Ravens. Othell Diluzio PAC For Dr. Lupe Carney 12/02/2011, 4:45 PM

## 2012-09-26 ENCOUNTER — Emergency Department (HOSPITAL_COMMUNITY)
Admission: EM | Admit: 2012-09-26 | Discharge: 2012-09-27 | Disposition: A | Discharge status: Home / Self Care | Payer: Medicaid Other | Attending: Emergency Medicine | Admitting: Emergency Medicine

## 2012-09-26 ENCOUNTER — Encounter (HOSPITAL_COMMUNITY): Payer: Self-pay | Admitting: Emergency Medicine

## 2012-09-26 DIAGNOSIS — IMO0001 Reserved for inherently not codable concepts without codable children: Secondary | ICD-10-CM | POA: Insufficient documentation

## 2012-09-26 DIAGNOSIS — T400X1A Poisoning by opium, accidental (unintentional), initial encounter: Secondary | ICD-10-CM | POA: Insufficient documentation

## 2012-09-26 DIAGNOSIS — Y939 Activity, unspecified: Secondary | ICD-10-CM | POA: Insufficient documentation

## 2012-09-26 DIAGNOSIS — I509 Heart failure, unspecified: Secondary | ICD-10-CM | POA: Insufficient documentation

## 2012-09-26 DIAGNOSIS — Y929 Unspecified place or not applicable: Secondary | ICD-10-CM | POA: Insufficient documentation

## 2012-09-26 DIAGNOSIS — Z8701 Personal history of pneumonia (recurrent): Secondary | ICD-10-CM | POA: Insufficient documentation

## 2012-09-26 DIAGNOSIS — F1020 Alcohol dependence, uncomplicated: Secondary | ICD-10-CM | POA: Insufficient documentation

## 2012-09-26 DIAGNOSIS — I10 Essential (primary) hypertension: Secondary | ICD-10-CM | POA: Insufficient documentation

## 2012-09-26 DIAGNOSIS — Z87448 Personal history of other diseases of urinary system: Secondary | ICD-10-CM | POA: Insufficient documentation

## 2012-09-26 DIAGNOSIS — Z8709 Personal history of other diseases of the respiratory system: Secondary | ICD-10-CM | POA: Insufficient documentation

## 2012-09-26 DIAGNOSIS — G8929 Other chronic pain: Secondary | ICD-10-CM | POA: Insufficient documentation

## 2012-09-26 DIAGNOSIS — Z8614 Personal history of Methicillin resistant Staphylococcus aureus infection: Secondary | ICD-10-CM | POA: Insufficient documentation

## 2012-09-26 DIAGNOSIS — G609 Hereditary and idiopathic neuropathy, unspecified: Secondary | ICD-10-CM | POA: Insufficient documentation

## 2012-09-26 DIAGNOSIS — Z8659 Personal history of other mental and behavioral disorders: Secondary | ICD-10-CM | POA: Insufficient documentation

## 2012-09-26 DIAGNOSIS — F17200 Nicotine dependence, unspecified, uncomplicated: Secondary | ICD-10-CM | POA: Insufficient documentation

## 2012-09-26 HISTORY — DX: Perforation of intestine (nontraumatic): K63.1

## 2012-09-26 HISTORY — DX: Pneumonia, unspecified organism: J18.9

## 2012-09-26 HISTORY — DX: Heart failure, unspecified: I50.9

## 2012-09-26 HISTORY — DX: Respiratory failure, unspecified, unspecified whether with hypoxia or hypercapnia: J96.90

## 2012-09-26 HISTORY — DX: Methicillin resistant Staphylococcus aureus infection, unspecified site: A49.02

## 2012-09-26 HISTORY — DX: Disorder of kidney and ureter, unspecified: N28.9

## 2012-09-26 LAB — COMPREHENSIVE METABOLIC PANEL
ALT: 10 U/L (ref 0–53)
AST: 13 U/L (ref 0–37)
Alkaline Phosphatase: 127 U/L — ABNORMAL HIGH (ref 39–117)
CO2: 26 mEq/L (ref 19–32)
Chloride: 103 mEq/L (ref 96–112)
GFR calc Af Amer: >90 mL/min (ref >90)
GFR calc non Af Amer: >90 mL/min (ref >90)
Glucose, Bld: 128 mg/dL — ABNORMAL HIGH (ref 70–99)
Potassium: 3.4 mEq/L — ABNORMAL LOW (ref 3.5–5.1)
Sodium: 139 mEq/L (ref 135–145)
Total Bilirubin: 0.2 mg/dL — ABNORMAL LOW (ref 0.3–1.2)

## 2012-09-26 LAB — RAPID URINE DRUG SCREEN, HOSP PERFORMED
Amphetamines: NOT DETECTED
Tetrahydrocannabinol: NOT DETECTED

## 2012-09-26 LAB — CBC WITH DIFFERENTIAL/PLATELET
Basophils Absolute: 0 10*3/uL (ref 0.0–0.1)
Basophils Relative: 1 % (ref 0–1)
Eosinophils Absolute: 0.2 10*3/uL (ref 0.0–0.7)
Hemoglobin: 12.4 g/dL — ABNORMAL LOW (ref 13.0–17.0)
MCH: 30.2 pg (ref 26.0–34.0)
MCHC: 34.9 g/dL (ref 30.0–36.0)
Monocytes Relative: 5 % (ref 3–12)
Neutro Abs: 6.3 10*3/uL (ref 1.7–7.7)
Neutrophils Relative %: 73 % (ref 43–77)
Platelets: 358 10*3/uL (ref 150–400)
RDW: 15.5 % (ref 11.5–15.5)

## 2012-09-26 LAB — ACETAMINOPHEN LEVEL: Acetaminophen (Tylenol), Serum: <15 ug/mL (ref 10–30)

## 2012-09-26 LAB — SALICYLATE LEVEL: Salicylate Lvl: <2 mg/dL — ABNORMAL LOW (ref 2.8–20.0)

## 2012-09-26 MED ORDER — ACETAMINOPHEN 325 MG PO TABS
650.0000 mg | ORAL_TABLET | Freq: Once | ORAL | Status: DC
  Filled 2012-09-26: qty 2

## 2012-09-26 NOTE — ED Notes (Signed)
Bed:WA08<BR> Expected date:<BR> Expected time:<BR> Means of arrival:<BR> Comments:<BR>

## 2012-09-26 NOTE — ED Notes (Signed)
Per EMS: Pt has extreme pain in right foot. Pt self-medicated self with fentanyl patches to alleviate pain and also took two oxycodone and drank a fifth of vodka. Family was concerned about an overdose and called EMS. Per EMS patient is A/O x4 and c/o pain. Patient denies SI.
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
## 2012-09-26 NOTE — ED Provider Notes (Signed)
History     CSN: [id number]  Arrival date & time 09/26/12  2101   First MD Initiated Contact with Patient 09/26/12 2132      Chief Complaint  Patient presents with  . Drug Overdose  . Peripheral Neuropathy    (Consider location/radiation/quality/duration/timing/severity/associated sxs/prior treatment) HPI Brought by EMS. History is obtained from patient and from patient's sisterKathleen Levitan via telephone at [phone]. 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 Today patient drank an entire bottle of vodka and placed 4 or 5 fentanyl patches onto himself to treat his pain. Patient denies suicidal ideation and his sister states that he has not been suicidal. Nothing makes the pain better or worse. No fever no other complaint Past Medical History  Diagnosis Date  . Depression   . Alcoholic   . Hypertension   . Renal disorder   . Respiratory failure   . CHF (congestive heart failure)   . Perforation bowel   . PNA (pneumonia)   . MRSA (methicillin resistant Staphylococcus aureus)     Past Surgical History  Procedure Laterality Date  . Abdominal surgery    . Cardiac surgery    . Peripherally inserted central catheter insertion      No family history on file.  History  Substance Use Topics  . Smoking status: Current Every Day Smoker -- 1.50 packs/day for 25 years    Types: Cigarettes  . Smokeless tobacco: Not on file  . Alcohol Use: Yes     Comment: 1/5 daily      Review of Systems  Constitutional: Negative.   HENT: Negative.   Respiratory: Negative.   Cardiovascular: Negative.   Gastrointestinal: Negative.   Musculoskeletal: Positive for myalgias and arthralgias.  Skin: Negative.   Neurological: Negative.    Psychiatric/Behavioral: Negative.   All other systems reviewed and are negative.    Allergies  Bee venom  Home Medications  No current outpatient prescriptions on file.  BP 129/88  Pulse 104  Temp(Src) 98.1 F (36.7 C) (Oral)  Resp 18  SpO2 100%  Physical Exam  Nursing note and vitals reviewed. Constitutional: He appears distressed.  Chronically ill-appearing  HENT:  Head: Normocephalic and atraumatic.  Eyes: Conjunctivae are normal. Pupils are equal, round, and reactive to light.  Neck: Neck supple. No tracheal deviation present. No thyromegaly present.  Cardiovascular: Normal rate and regular rhythm.   No murmur heard. Pulmonary/Chest: Effort normal and breath sounds normal.  Abdominal: Soft. Bowel sounds are normal. He exhibits no distension. There is no tenderness.  Open well-healing wound and anterior abdominal wall. Colostomy in place  Musculoskeletal: Normal range of motion. He exhibits no edema and no tenderness.  Neurological: He is alert. Coordination normal.  4 extremities without redness or tenderness neurovascularly  Skin: Skin is warm and dry. No rash noted.  Psychiatric: He has a normal mood and affect.    ED Course  Procedures (including critical care time)  Labs Reviewed - No data to display No results found.   No diagnosis found. 12 midnight patient alert Glasgow Coma Score 15 Results for orders placed during the hospital encounter of 09/26/12  COMPREHENSIVE METABOLIC PANEL      Result Value Range   Sodium 139  135 - 145 mEq/L  Potassium 3.4 (*) 3.5 - 5.1 mEq/L   Chloride 103  96 - 112 mEq/L   CO2 26  19 - 32 mEq/L   Glucose, Bld 128 (*) 70 - 99 mg/dL   BUN 7  6 - 23 mg/dL   Creatinine, Ser 6.57  0.50 - 1.35 mg/dL   Calcium 9.4  8.4 - 84.6 mg/dL   Total Protein 6.9  6.0 - 8.3 g/dL   Albumin 3.6  3.5 - 5.2 g/dL   AST 13  0 - 37 U/L   ALT 10  0 - 53 U/L   Alkaline Phosphatase 127 (*) 39 - 117 U/L   Total Bilirubin 0.2 (*) 0.3 - 1.2  mg/dL   GFR calc non Af Amer >90  >90 mL/min   GFR calc Af Amer >90  >90 mL/min  CBC WITH DIFFERENTIAL      Result Value Range   WBC 8.6  4.0 - 10.5 K/uL   RBC 4.11 (*) 4.22 - 5.81 MIL/uL   Hemoglobin 12.4 (*) 13.0 - 17.0 g/dL   HCT 96.2 (*) 95.2 - 84.1 %   MCV 86.4  78.0 - 100.0 fL   MCH 30.2  26.0 - 34.0 pg   MCHC 34.9  30.0 - 36.0 g/dL   RDW 32.4  40.1 - 02.7 %   Platelets 358  150 - 400 K/uL   Neutrophils Relative 73  43 - 77 %   Neutro Abs 6.3  1.7 - 7.7 K/uL   Lymphocytes Relative 20  12 - 46 %   Lymphs Abs 1.7  0.7 - 4.0 K/uL   Monocytes Relative 5  3 - 12 %   Monocytes Absolute 0.4  0.1 - 1.0 K/uL   Eosinophils Relative 3  0 - 5 %   Eosinophils Absolute 0.2  0.0 - 0.7 K/uL   Basophils Relative 1  0 - 1 %   Basophils Absolute 0.0  0.0 - 0.1 K/uL  ETHANOL      Result Value Range   Alcohol, Ethyl (B) 198 (*) 0 - 11 mg/dL  ACETAMINOPHEN LEVEL      Result Value Range   Acetaminophen (Tylenol), Serum <15.0  10 - 30 ug/mL  URINE RAPID DRUG SCREEN (HOSP PERFORMED)      Result Value Range   Opiates NONE DETECTED  NONE DETECTED   Cocaine NONE DETECTED  NONE DETECTED   Benzodiazepines NONE DETECTED  NONE DETECTED   Amphetamines NONE DETECTED  NONE DETECTED   Tetrahydrocannabinol NONE DETECTED  NONE DETECTED   Barbiturates NONE DETECTED  NONE DETECTED  SALICYLATE LEVEL      Result Value Range   Salicylate Lvl <2.0 (*) 2.8 - 20.0 mg/dL   No results found.  Old hospital records revieewed MDM  I spoke at length with the patient's sister with whom he lives patient suffers from chronic pain likely needs pain management clinic. He will be furnished with resource guide. We will not right for narcotic pain medicine tonight due to his alcohol problem and history of substance abuse Pt has not suffered from dangerous overdose Dx. #1 chronic pain #2 alcohol intoxication       Doug Sou, MD 09/27/12 2536

## 2012-09-27 NOTE — ED Notes (Signed)
Attempting to get the patient home via cab voucher and the patient is not willing. Repeats frequently that he wants pain medications. The patient emptied his colostomy bag on the floor. The patient is yelling at the staff at the desk. Patient wheeled out to the waiting room and placed in cab

## 2012-11-04 ENCOUNTER — Encounter (HOSPITAL_COMMUNITY): Payer: Self-pay

## 2012-11-04 ENCOUNTER — Emergency Department (HOSPITAL_COMMUNITY)
Admission: EM | Admit: 2012-11-04 | Discharge: 2012-11-06 | Disposition: A | Discharge status: Other Acute Inpatient Hospital | Payer: Medicaid Other | Attending: Emergency Medicine | Admitting: Emergency Medicine

## 2012-11-04 DIAGNOSIS — F329 Major depressive disorder, single episode, unspecified: Secondary | ICD-10-CM | POA: Insufficient documentation

## 2012-11-04 DIAGNOSIS — Z8719 Personal history of other diseases of the digestive system: Secondary | ICD-10-CM | POA: Insufficient documentation

## 2012-11-04 DIAGNOSIS — F1994 Other psychoactive substance use, unspecified with psychoactive substance-induced mood disorder: Secondary | ICD-10-CM | POA: Diagnosis present

## 2012-11-04 DIAGNOSIS — I10 Essential (primary) hypertension: Secondary | ICD-10-CM | POA: Insufficient documentation

## 2012-11-04 DIAGNOSIS — I509 Heart failure, unspecified: Secondary | ICD-10-CM | POA: Insufficient documentation

## 2012-11-04 DIAGNOSIS — Z87448 Personal history of other diseases of urinary system: Secondary | ICD-10-CM | POA: Insufficient documentation

## 2012-11-04 DIAGNOSIS — F17200 Nicotine dependence, unspecified, uncomplicated: Secondary | ICD-10-CM | POA: Insufficient documentation

## 2012-11-04 DIAGNOSIS — R319 Hematuria, unspecified: Secondary | ICD-10-CM | POA: Insufficient documentation

## 2012-11-04 DIAGNOSIS — F3289 Other specified depressive episodes: Secondary | ICD-10-CM | POA: Insufficient documentation

## 2012-11-04 DIAGNOSIS — F1020 Alcohol dependence, uncomplicated: Secondary | ICD-10-CM | POA: Diagnosis present

## 2012-11-04 DIAGNOSIS — F1010 Alcohol abuse, uncomplicated: Secondary | ICD-10-CM | POA: Insufficient documentation

## 2012-11-04 DIAGNOSIS — Z8701 Personal history of pneumonia (recurrent): Secondary | ICD-10-CM | POA: Insufficient documentation

## 2012-11-04 DIAGNOSIS — Z8614 Personal history of Methicillin resistant Staphylococcus aureus infection: Secondary | ICD-10-CM | POA: Insufficient documentation

## 2012-11-04 LAB — COMPREHENSIVE METABOLIC PANEL
ALT: 13 U/L (ref 0–53)
Albumin: 4 g/dL (ref 3.5–5.2)
Alkaline Phosphatase: 178 U/L — ABNORMAL HIGH (ref 39–117)
Chloride: 101 mEq/L (ref 96–112)
Potassium: 3.2 mEq/L — ABNORMAL LOW (ref 3.5–5.1)
Sodium: 139 mEq/L (ref 135–145)
Total Protein: 8.1 g/dL (ref 6.0–8.3)

## 2012-11-04 LAB — RAPID URINE DRUG SCREEN, HOSP PERFORMED
Amphetamines: NOT DETECTED
Barbiturates: NOT DETECTED
Benzodiazepines: NOT DETECTED
Cocaine: NOT DETECTED
Tetrahydrocannabinol: NOT DETECTED

## 2012-11-04 LAB — CBC
Hemoglobin: 17 g/dL (ref 13.0–17.0)
MCHC: 35 g/dL (ref 30.0–36.0)
RDW: 17.4 % — ABNORMAL HIGH (ref 11.5–15.5)
WBC: 11.7 10*3/uL — ABNORMAL HIGH (ref 4.0–10.5)

## 2012-11-04 LAB — ACETAMINOPHEN LEVEL: Acetaminophen (Tylenol), Serum: <15 ug/mL (ref 10–30)

## 2012-11-04 MED ORDER — LORAZEPAM 1 MG PO TABS
0.0000 mg | ORAL_TABLET | Freq: Two times a day (BID) | ORAL | Status: DC
  Filled 2012-11-04: qty 2

## 2012-11-04 MED ORDER — PERMETHRIN 5 % EX CREA
TOPICAL_CREAM | Freq: Once | CUTANEOUS | Status: AC
  Administered 2012-11-05: 1 via TOPICAL
  Filled 2012-11-04: qty 60

## 2012-11-04 MED ORDER — LORAZEPAM 1 MG PO TABS
0.0000 mg | ORAL_TABLET | Freq: Four times a day (QID) | ORAL | Status: AC
  Administered 2012-11-04: 2 mg via ORAL
  Administered 2012-11-04 – 2012-11-05 (×2): 1 mg via ORAL
  Administered 2012-11-05: 2 mg via ORAL
  Filled 2012-11-04: qty 1
  Filled 2012-11-04 (×2): qty 2

## 2012-11-04 MED ORDER — THIAMINE HCL 100 MG/ML IJ SOLN
100.0000 mg | Freq: Every day | INTRAMUSCULAR | Status: DC
  Filled 2012-11-04: qty 2

## 2012-11-04 MED ORDER — FOLIC ACID 1 MG PO TABS
1.0000 mg | ORAL_TABLET | Freq: Every day | ORAL | Status: DC
  Administered 2012-11-04 – 2012-11-05 (×2): 1 mg via ORAL
  Filled 2012-11-04 (×2): qty 1

## 2012-11-04 MED ORDER — NICOTINE 21 MG/24HR TD PT24
21.0000 mg | MEDICATED_PATCH | Freq: Once | TRANSDERMAL | Status: AC
  Administered 2012-11-04: 21 mg via TRANSDERMAL
  Filled 2012-11-04 (×2): qty 1

## 2012-11-04 MED ORDER — TRAZODONE HCL 100 MG PO TABS
100.0000 mg | ORAL_TABLET | Freq: Every day | ORAL | Status: DC
  Administered 2012-11-04 – 2012-11-05 (×3): 100 mg via ORAL
  Filled 2012-11-04 (×3): qty 1

## 2012-11-04 MED ORDER — POTASSIUM CHLORIDE CRYS ER 20 MEQ PO TBCR
40.0000 meq | EXTENDED_RELEASE_TABLET | Freq: Once | ORAL | Status: AC
  Administered 2012-11-04: 40 meq via ORAL
  Filled 2012-11-04: qty 2

## 2012-11-04 MED ORDER — VITAMIN B-1 100 MG PO TABS
100.0000 mg | ORAL_TABLET | Freq: Every day | ORAL | Status: DC
  Administered 2012-11-04 – 2012-11-05 (×2): 100 mg via ORAL
  Filled 2012-11-04 (×2): qty 1

## 2012-11-04 MED ORDER — ADULT MULTIVITAMIN W/MINERALS CH
1.0000 | ORAL_TABLET | Freq: Every day | ORAL | Status: DC
  Administered 2012-11-04 – 2012-11-05 (×2): 1 via ORAL
  Filled 2012-11-04 (×2): qty 1

## 2012-11-04 MED ORDER — ACETAMINOPHEN 325 MG PO TABS
650.0000 mg | ORAL_TABLET | ORAL | Status: DC | PRN
  Administered 2012-11-04: 650 mg via ORAL
  Filled 2012-11-04: qty 2

## 2012-11-04 MED ORDER — LORAZEPAM 1 MG PO TABS
1.0000 mg | ORAL_TABLET | Freq: Three times a day (TID) | ORAL | Status: DC | PRN
  Administered 2012-11-04: 1 mg via ORAL
  Filled 2012-11-04: qty 1

## 2012-11-04 MED ORDER — IBUPROFEN 200 MG PO TABS
600.0000 mg | ORAL_TABLET | Freq: Three times a day (TID) | ORAL | Status: DC | PRN
  Administered 2012-11-04 – 2012-11-05 (×2): 600 mg via ORAL
  Filled 2012-11-04 (×2): qty 3

## 2012-11-04 NOTE — ED Notes (Signed)
Zadie Rhine, MD at bedside, assessed pt's rashes to BUE and BLE.

## 2012-11-04 NOTE — ED Notes (Signed)
Pt arrives by [facility] from [facility]-pt here for medical clearance-is a pt of the [place] in [place].  Per initial [facility] assessment-pt is voluntary-drinking alcohol tonight/pt is covered in rash-? Scabies-has an ileostomy. Pt is loud, aggressive, yelling at [employer]-pt is disheveled.  Per [facility]-pt is suicidal and his plan is to jump out of a window of a hotel.

## 2012-11-04 NOTE — ED Notes (Signed)
Joe AC made aware of need for sitter 

## 2012-11-04 NOTE — ED Notes (Signed)
Report given to RN on acute side of ED. Will transfer to room 22.

## 2012-11-04 NOTE — ED Notes (Signed)
Patient given urinal and advised need sample. -- Assisted patient to stand but states is unable to void. Will re-attempt.

## 2012-11-04 NOTE — ED Provider Notes (Signed)
History     CSN: [id number]  Arrival date & time 11/04/12  0229   First MD Initiated Contact with Patient 11/04/12 0244      Chief Complaint  Patient presents with  . Medical Clearance    (Consider location/radiation/quality/duration/timing/severity/associated sxs/prior treatment) The history is provided by the patient.  pt w hx depression, c/o feels more depressed in past 1-2 weeks. Waxes and wanes in intensity. States related to multiple issues w health and personal life. Denies specific new stressor. +hx etoh abuse, states binge drinks sporadically. Denies daily etoh use, denies hx etoh withdrawal, szs or dts. States has intermittent thoughts of suicide. Denies overdose or attempt at self harm. No homicidal thoughts. No hallucinations. States has been off his meds for depression for 'long while'.      Past Medical History  Diagnosis Date  . Depression   . Alcoholic   . Hypertension   . Renal disorder   . Respiratory failure   . CHF (congestive heart failure)   . Perforation bowel   . PNA (pneumonia)   . MRSA (methicillin resistant Staphylococcus aureus)     Past Surgical History  Procedure Laterality Date  . Abdominal surgery    . Cardiac surgery    . Peripherally inserted central catheter insertion      No family history on file.  History  Substance Use Topics  . Smoking status: Current Every Day Smoker -- 1.50 packs/day for 25 years    Types: Cigarettes  . Smokeless tobacco: Not on file  . Alcohol Use: Yes     Comment: 1/5 daily      Review of Systems  Constitutional: Negative for fever.  HENT: Negative for neck pain.   Eyes: Negative for redness.  Respiratory: Negative for shortness of breath.   Cardiovascular: Negative for chest pain.  Gastrointestinal: Negative for vomiting and abdominal pain.  Genitourinary: Negative for flank pain.  Musculoskeletal: Negative for back pain.  Skin: Negative for rash.  Neurological: Negative for headaches.   Hematological: Does not bruise/bleed easily.  Psychiatric/Behavioral: Negative for confusion.    Allergies  Bee venom  Home Medications  No current outpatient prescriptions on file.  BP 126/85  Pulse 115  Temp(Src) 98.5 F (36.9 C) (Oral)  Resp 20  SpO2 98%  Physical Exam  Nursing note and vitals reviewed. Constitutional: He is oriented to person, place, and time. He appears well-developed and well-nourished. No distress.  HENT:  Head: Atraumatic.  Eyes: Conjunctivae are normal. Pupils are equal, round, and reactive to light. No scleral icterus.  Neck: Neck supple. No tracheal deviation present.  Cardiovascular: Normal rate, regular rhythm, normal heart sounds and intact distal pulses.   Pulmonary/Chest: Effort normal and breath sounds normal. No accessory muscle usage. No respiratory distress.  Abdominal: Soft. He exhibits no distension. There is no tenderness.  Several healed surgical scars. +ventral hernia and peristomal hernia. Ostomy pink, patent, functioning. No abd distension or tenderness.   Musculoskeletal: Normal range of motion. He exhibits no edema and no tenderness.  Neurological: He is alert and oriented to person, place, and time.  Skin: Skin is warm and dry.  Psychiatric:  Depressed mood.     ED Course  Procedures (including critical care time)   Results for orders placed during the hospital encounter of 11/04/12  ACETAMINOPHEN LEVEL      Result Value Range   Acetaminophen (Tylenol), Serum <15.0  10 - 30 ug/mL  CBC      Result Value Range  WBC 11.7 (*) 4.0 - 10.5 K/uL   RBC 5.23  4.22 - 5.81 MIL/uL   Hemoglobin 17.0  13.0 - 17.0 g/dL   HCT 16.1  09.6 - 04.5 %   MCV 92.9  78.0 - 100.0 fL   MCH 32.5  26.0 - 34.0 pg   MCHC 35.0  30.0 - 36.0 g/dL   RDW 40.9 (*) 81.1 - 91.4 %   Platelets 268  150 - 400 K/uL  COMPREHENSIVE METABOLIC PANEL      Result Value Range   Sodium 139  135 - 145 mEq/L   Potassium 3.2 (*) 3.5 - 5.1 mEq/L   Chloride 101  96  - 112 mEq/L   CO2 25  19 - 32 mEq/L   Glucose, Bld 121 (*) 70 - 99 mg/dL   BUN 7  6 - 23 mg/dL   Creatinine, Ser 7.82  0.50 - 1.35 mg/dL   Calcium 95.6  8.4 - 21.3 mg/dL   Total Protein 8.1  6.0 - 8.3 g/dL   Albumin 4.0  3.5 - 5.2 g/dL   AST 15  0 - 37 U/L   ALT 13  0 - 53 U/L   Alkaline Phosphatase 178 (*) 39 - 117 U/L   Total Bilirubin 0.3  0.3 - 1.2 mg/dL   GFR calc non Af Amer >90  >90 mL/min   GFR calc Af Amer >90  >90 mL/min  ETHANOL      Result Value Range   Alcohol, Ethyl (B) 229 (*) 0 - 11 mg/dL  SALICYLATE LEVEL      Result Value Range   Salicylate Lvl <2.0 (*) 2.8 - 20.0 mg/dL       MDM  Labs.  Act team called.  Reviewed nursing notes and prior charts for additional history.   kcl po.  Psych holding orders.   Pharmacy to do med reconciliation.  Discussed w act team.   Given SI, pt states plan to jump out of a building window, ivc papers.  Disposition pending.  Placed on etoh withdrawal protocol.         Suzi Roots, MD 11/04/12 [phone]

## 2012-11-04 NOTE — ED Notes (Signed)
[id number]<AV> Expected date:<BR> Expected time:<BR> Means of arrival:<BR> Comments:<BR> Room 30

## 2012-11-04 NOTE — ED Notes (Signed)
Pt refuses to urinate in a cup-requesting to be cathed

## 2012-11-04 NOTE — Consult Note (Deleted)
Reason for Consult:  Evaluation for inpatient treatment Referring Physician: EDP  Lucas Small is an 47 y.o. male.  HPI: Patient states that she has a lower level of functioning, crying spells, and worsening depression.  Patient states that she see's Dr. Lolly Mustache on a outpatient basis and was last seen six weeks ago but the changes in mood and functional level occurred after visit.  Patient was instructed by office and mother-in-law to seek help.   Patient states that she has been compliant with medications with changes made at last visit.  Patient also state changes in weight (gain).  Past Medical History  Diagnosis Date  . Depression   . Alcoholic   . Hypertension   . Renal disorder   . Respiratory failure   . CHF (congestive heart failure)   . Perforation bowel   . PNA (pneumonia)   . MRSA (methicillin resistant Staphylococcus aureus)     Past Surgical History  Procedure Laterality Date  . Abdominal surgery    . Cardiac surgery    . Peripherally inserted central catheter insertion      No family history on file.  Social History:  reports that he has been smoking Cigarettes.  He has a 37.5 pack-year smoking history. He does not have any smokeless tobacco history on file. He reports that  drinks alcohol. His drug history is not on file.  Allergies:  Allergies  Allergen Reactions  . Bee Venom Anaphylaxis    Medications: I have reviewed the patient's current medications.  Results for orders placed during the hospital encounter of 11/04/12 (from the past 48 hour(s))  ACETAMINOPHEN LEVEL     Status: None   Collection Time    11/04/12  2:52 AM      Result Value Range   Acetaminophen (Tylenol), Serum <15.0  10 - 30 ug/mL   Comment:            THERAPEUTIC CONCENTRATIONS VARY     SIGNIFICANTLY. A RANGE OF 10-30     ug/mL MAY BE AN EFFECTIVE     CONCENTRATION FOR MANY PATIENTS.     HOWEVER, SOME ARE BEST TREATED     AT CONCENTRATIONS OUTSIDE THIS     RANGE.   ACETAMINOPHEN CONCENTRATIONS     >150 ug/mL AT 4 HOURS AFTER     INGESTION AND >50 ug/mL AT 12     HOURS AFTER INGESTION ARE     OFTEN ASSOCIATED WITH TOXIC     REACTIONS.  CBC     Status: Abnormal   Collection Time    11/04/12  2:52 AM      Result Value Range   WBC 11.7 (*) 4.0 - 10.5 K/uL   RBC 5.23  4.22 - 5.81 MIL/uL   Hemoglobin 17.0  13.0 - 17.0 g/dL   HCT 16.1  09.6 - 04.5 %   MCV 92.9  78.0 - 100.0 fL   MCH 32.5  26.0 - 34.0 pg   MCHC 35.0  30.0 - 36.0 g/dL   RDW 40.9 (*) 81.1 - 91.4 %   Platelets 268  150 - 400 K/uL  COMPREHENSIVE METABOLIC PANEL     Status: Abnormal   Collection Time    11/04/12  2:52 AM      Result Value Range   Sodium 139  135 - 145 mEq/L   Potassium 3.2 (*) 3.5 - 5.1 mEq/L   Chloride 101  96 - 112 mEq/L   CO2 25  19 - 32 mEq/L  Glucose, Bld 121 (*) 70 - 99 mg/dL   BUN 7  6 - 23 mg/dL   Creatinine, Ser 4.54  0.50 - 1.35 mg/dL   Calcium 09.8  8.4 - 11.9 mg/dL   Total Protein 8.1  6.0 - 8.3 g/dL   Albumin 4.0  3.5 - 5.2 g/dL   AST 15  0 - 37 U/L   ALT 13  0 - 53 U/L   Alkaline Phosphatase 178 (*) 39 - 117 U/L   Total Bilirubin 0.3  0.3 - 1.2 mg/dL   GFR calc non Af Amer >90  >90 mL/min   GFR calc Af Amer >90  >90 mL/min   Comment:            The eGFR has been calculated     using the CKD EPI equation.     This calculation has not been     validated in all clinical     situations.     eGFR's persistently     <90 mL/min signify     possible Chronic Kidney Disease.  ETHANOL     Status: Abnormal   Collection Time    11/04/12  2:52 AM      Result Value Range   Alcohol, Ethyl (B) 229 (*) 0 - 11 mg/dL   Comment:            LOWEST DETECTABLE LIMIT FOR     SERUM ALCOHOL IS 11 mg/dL     FOR MEDICAL PURPOSES ONLY  SALICYLATE LEVEL     Status: Abnormal   Collection Time    11/04/12  2:52 AM      Result Value Range   Salicylate Lvl <2.0 (*) 2.8 - 20.0 mg/dL  URINE RAPID DRUG SCREEN (HOSP PERFORMED)     Status: None   Collection Time     11/04/12 12:31 PM      Result Value Range   Opiates NONE DETECTED  NONE DETECTED   Cocaine NONE DETECTED  NONE DETECTED   Benzodiazepines NONE DETECTED  NONE DETECTED   Amphetamines NONE DETECTED  NONE DETECTED   Tetrahydrocannabinol NONE DETECTED  NONE DETECTED   Barbiturates NONE DETECTED  NONE DETECTED   Comment:            DRUG SCREEN FOR MEDICAL PURPOSES     ONLY.  IF CONFIRMATION IS NEEDED     FOR ANY PURPOSE, NOTIFY LAB     WITHIN 5 DAYS.                LOWEST DETECTABLE LIMITS     FOR URINE DRUG SCREEN     Drug Class       Cutoff (ng/mL)     Amphetamine      1000     Barbiturate      200     Benzodiazepine   200     Tricyclics       300     Opiates          300     Cocaine          300     THC              50    No results found.  Review of Systems  Constitutional: Negative.   Psychiatric/Behavioral: Positive for depression. Negative for suicidal ideas and hallucinations. The patient is nervous/anxious.        I'm just having crying spells and depressed for no reason.  Blood pressure 126/85, pulse 115, temperature 98.5 F (36.9 C), temperature source Oral, resp. rate 20, SpO2 98.00%. Physical Exam  Constitutional: He is oriented to person, place, and time. He appears well-developed.  HENT:  Head: Normocephalic.  Eyes: Pupils are equal, round, and reactive to light.  Neck: Normal range of motion.  Cardiovascular: Normal rate.   Respiratory: Effort normal.  GI: Soft.  Neurological: He is alert and oriented to person, place, and time.  Skin: Skin is warm and dry.  Psychiatric: His speech is normal and behavior is normal. He exhibits a depressed mood. He expresses no suicidal ideation. He exhibits abnormal recent memory.  Patient appears tearful and depress with flat affect.      Assessment/Plan:  Face to face and consult with Dr. Lolly Mustache Recommendations:  In patient treatment Be sure to follow up with resources and follow ups given.  If you find that you can  not keep an appointment, call and reschedule.  Rankin, Shuvon FNP-BC 11/04/2012, 5:17 PM

## 2012-11-05 ENCOUNTER — Encounter (HOSPITAL_COMMUNITY): Payer: Self-pay | Admitting: Radiology

## 2012-11-05 ENCOUNTER — Emergency Department (HOSPITAL_COMMUNITY): Payer: Medicaid Other

## 2012-11-05 DIAGNOSIS — G4700 Insomnia, unspecified: Secondary | ICD-10-CM

## 2012-11-05 DIAGNOSIS — F411 Generalized anxiety disorder: Secondary | ICD-10-CM

## 2012-11-05 DIAGNOSIS — F329 Major depressive disorder, single episode, unspecified: Secondary | ICD-10-CM

## 2012-11-05 DIAGNOSIS — R45851 Suicidal ideations: Secondary | ICD-10-CM

## 2012-11-05 LAB — CBC WITH DIFFERENTIAL/PLATELET
Basophils Absolute: 0 10*3/uL (ref 0.0–0.1)
Eosinophils Absolute: 0.3 10*3/uL (ref 0.0–0.7)
Eosinophils Relative: 3 % (ref 0–5)
MCH: 33.5 pg (ref 26.0–34.0)
MCHC: 35.5 g/dL (ref 30.0–36.0)
MCV: 94.4 fL (ref 78.0–100.0)
Neutro Abs: 8.8 10*3/uL — ABNORMAL HIGH (ref 1.7–7.7)
Platelets: 234 10*3/uL (ref 150–400)
RBC: 4.66 MIL/uL (ref 4.22–5.81)
RDW: 17.7 % — ABNORMAL HIGH (ref 11.5–15.5)

## 2012-11-05 LAB — COMPREHENSIVE METABOLIC PANEL
ALT: 14 U/L (ref 0–53)
AST: 20 U/L (ref 0–37)
BUN: 17 mg/dL (ref 6–23)
Calcium: 9.4 mg/dL (ref 8.4–10.5)
GFR calc Af Amer: >90 mL/min (ref >90)
Potassium: 3.9 mEq/L (ref 3.5–5.1)
Sodium: 131 mEq/L — ABNORMAL LOW (ref 135–145)
Total Bilirubin: 0.3 mg/dL (ref 0.3–1.2)
Total Protein: 6.9 g/dL (ref 6.0–8.3)

## 2012-11-05 LAB — URINALYSIS, ROUTINE W REFLEX MICROSCOPIC
Bilirubin Urine: NEGATIVE
Protein, ur: 30 mg/dL — AB
Urobilinogen, UA: 0.2 mg/dL (ref 0.0–1.0)

## 2012-11-05 LAB — PRO B NATRIURETIC PEPTIDE: Pro B Natriuretic peptide (BNP): 107.1 pg/mL (ref 0–125)

## 2012-11-05 LAB — LACTIC ACID, PLASMA: Lactic Acid, Venous: 1.7 mmol/L (ref 0.5–2.2)

## 2012-11-05 LAB — URINE MICROSCOPIC-ADD ON

## 2012-11-05 LAB — ACETAMINOPHEN LEVEL: Acetaminophen (Tylenol), Serum: <15 ug/mL (ref 10–30)

## 2012-11-05 MED ORDER — ASPIRIN 325 MG PO TABS
325.0000 mg | ORAL_TABLET | Freq: Every day | ORAL | Status: DC
  Administered 2012-11-05: 325 mg via ORAL
  Filled 2012-11-05: qty 1

## 2012-11-05 MED ORDER — ONDANSETRON HCL 4 MG PO TABS: 4.0000 mg | ORAL_TABLET | Freq: Three times a day (TID) | ORAL | Status: DC | PRN

## 2012-11-05 MED ORDER — SODIUM CHLORIDE 0.9 % IV BOLUS (SEPSIS)
1000.0000 mL | Freq: Once | INTRAVENOUS | Status: AC
  Administered 2012-11-05: 1000 mL via INTRAVENOUS

## 2012-11-05 MED ORDER — ZOLPIDEM TARTRATE 5 MG PO TABS: 5.0000 mg | ORAL_TABLET | Freq: Every evening | ORAL | Status: DC | PRN

## 2012-11-05 MED ORDER — LAMOTRIGINE 100 MG PO TABS
100.0000 mg | ORAL_TABLET | Freq: Every day | ORAL | Status: DC
  Administered 2012-11-05 – 2012-11-06 (×2): 100 mg via ORAL
  Filled 2012-11-05 (×2): qty 1

## 2012-11-05 MED ORDER — ESCITALOPRAM OXALATE 10 MG PO TABS
20.0000 mg | ORAL_TABLET | Freq: Every day | ORAL | Status: DC
  Administered 2012-11-05: 20 mg via ORAL
  Filled 2012-11-05: qty 2

## 2012-11-05 MED ORDER — DILTIAZEM HCL ER 180 MG PO CP24
180.0000 mg | ORAL_CAPSULE | Freq: Every day | ORAL | Status: DC
  Administered 2012-11-05 – 2012-11-06 (×2): 180 mg via ORAL
  Filled 2012-11-05 (×2): qty 1

## 2012-11-05 MED ORDER — IOHEXOL 350 MG/ML SOLN
50.0000 mL | Freq: Once | INTRAVENOUS | Status: AC | PRN
  Administered 2012-11-05: 50 mL via INTRAVENOUS

## 2012-11-05 MED ORDER — IOHEXOL 300 MG/ML  SOLN
50.0000 mL | Freq: Once | INTRAMUSCULAR | Status: AC | PRN
  Administered 2012-11-05: 50 mL via ORAL

## 2012-11-05 MED ORDER — FENTANYL 25 MCG/HR TD PT72
25.0000 ug | MEDICATED_PATCH | TRANSDERMAL | Status: DC
  Administered 2012-11-05: 25 ug via TRANSDERMAL
  Filled 2012-11-05: qty 1

## 2012-11-05 MED ORDER — IOHEXOL 300 MG/ML  SOLN
100.0000 mL | Freq: Once | INTRAMUSCULAR | Status: AC | PRN
  Administered 2012-11-05: 100 mL via INTRAVENOUS

## 2012-11-05 MED ORDER — ACETAMINOPHEN 325 MG PO TABS: 650.0000 mg | ORAL_TABLET | ORAL | Status: DC | PRN

## 2012-11-05 MED ORDER — CITALOPRAM HYDROBROMIDE 20 MG PO TABS
20.0000 mg | ORAL_TABLET | Freq: Every day | ORAL | Status: DC
  Administered 2012-11-05 – 2012-11-06 (×2): 20 mg via ORAL
  Filled 2012-11-05 (×2): qty 1

## 2012-11-05 MED ORDER — CIPROFLOXACIN HCL 500 MG PO TABS
500.0000 mg | ORAL_TABLET | Freq: Two times a day (BID) | ORAL | Status: DC
  Administered 2012-11-05: 500 mg via ORAL
  Filled 2012-11-05: qty 1

## 2012-11-05 MED ORDER — FOLIC ACID 1 MG PO TABS: 1.0000 mg | ORAL_TABLET | Freq: Every day | ORAL | Status: DC

## 2012-11-05 MED ORDER — HYDROCODONE-ACETAMINOPHEN 5-325 MG PO TABS: 1.0000 | ORAL_TABLET | Freq: Four times a day (QID) | ORAL | Status: DC | PRN

## 2012-11-05 MED ORDER — METOPROLOL SUCCINATE ER 100 MG PO TB24
100.0000 mg | ORAL_TABLET | Freq: Every day | ORAL | Status: DC
  Administered 2012-11-05 – 2012-11-06 (×2): 100 mg via ORAL
  Filled 2012-11-05 (×2): qty 1

## 2012-11-05 MED ORDER — LORAZEPAM 1 MG PO TABS
2.0000 mg | ORAL_TABLET | Freq: Four times a day (QID) | ORAL | Status: DC | PRN
  Administered 2012-11-06: 2 mg via ORAL
  Filled 2012-11-05: qty 2

## 2012-11-05 MED ORDER — ALUM & MAG HYDROXIDE-SIMETH 200-200-20 MG/5ML PO SUSP: 30.0000 mL | ORAL | Status: DC | PRN

## 2012-11-05 MED ORDER — ADULT MULTIVITAMIN W/MINERALS CH: 1.0000 | ORAL_TABLET | Freq: Every day | ORAL | Status: DC

## 2012-11-05 MED ORDER — IBUPROFEN 200 MG PO TABS: 600.0000 mg | ORAL_TABLET | Freq: Three times a day (TID) | ORAL | Status: DC | PRN

## 2012-11-05 MED ORDER — VITAMIN B-1 100 MG PO TABS: 100.0000 mg | ORAL_TABLET | Freq: Every day | ORAL | Status: DC

## 2012-11-05 MED ORDER — CIPROFLOXACIN IN D5W 400 MG/200ML IV SOLN
400.0000 mg | Freq: Once | INTRAVENOUS | Status: AC
Start: 2012-11-05 — End: 2012-11-05
  Administered 2012-11-05: 400 mg via INTRAVENOUS
  Filled 2012-11-05: qty 200

## 2012-11-05 MED ORDER — NICOTINE 14 MG/24HR TD PT24
14.0000 mg | MEDICATED_PATCH | Freq: Every day | TRANSDERMAL | Status: DC
  Administered 2012-11-05: 14 mg via TRANSDERMAL
  Filled 2012-11-05: qty 1

## 2012-11-05 MED ORDER — HALOPERIDOL 5 MG PO TABS: 5.0000 mg | ORAL_TABLET | Freq: Four times a day (QID) | ORAL | Status: DC | PRN

## 2012-11-05 MED ORDER — SODIUM CHLORIDE 0.9 % IV BOLUS (SEPSIS): 1000.0000 mL | Freq: Once | INTRAVENOUS | Status: AC

## 2012-11-05 NOTE — ED Notes (Signed)
Called pharmacy for fentanyl patch; ptis not wearing patch now. Pt refuses nicotine and fent patch until after his shower.

## 2012-11-05 NOTE — ED Provider Notes (Signed)
Plan is for telepsych as patient is now denying SI Will also need treatment for possible scabies   Joya Gaskins, MD 11/05/12 0001

## 2012-11-05 NOTE — ED Notes (Signed)
Spoke with [place], SW regarding paperwork prior to sending pt to [facility]

## 2012-11-05 NOTE — ED Notes (Addendum)
Per pharmacy, it is okay for permethrin 5% to be washed off between 8 and 14 hours after application.  Permethrin given at 0021 and washed off at 1326.

## 2012-11-05 NOTE — ED Notes (Signed)
Notified Dr. Manus Gunning that the psych ED does not accept pt with drains.

## 2012-11-05 NOTE — ED Notes (Signed)
Spoke with MD regarding pt condition: HR has decreased to 103 and Dr. Manus Gunning is okay with pt going to TCU for a shower.  Two security personnel requested and the sitter and NT will accompany pt for a shower.  Contacted pharmacy to send down pt's 1000 meds as they have not arrived.

## 2012-11-05 NOTE — ED Notes (Signed)
Patient transported to X-ray 

## 2012-11-05 NOTE — Progress Notes (Signed)
CSW was in formed by RN stating someone had called stating patient bed was ready at [facility]. Per discussionwith NP patient accepted to [facility] pending 24 hour clearance from scabies treatmet. Per discussion with nurse, patient received treatment at  12:21am, therefore patient can not be transferred to [facility] until after that time.                CSW discussed with [facility] regarding patient. CSW unable to determine who called regarding patient going to [facility], this was not discussed with act or csw.   Catha Gosselin, LCSWA  [phone] .11/05/2012 1646pm

## 2012-11-05 NOTE — ED Notes (Signed)
Per Belenda Cruise, SW..the patient may not be transferred to [facility] at this time.

## 2012-11-05 NOTE — ED Provider Notes (Signed)
Psychiatry consultation is appreciated. Psychiatrist recommends inpatient care for major depression and recommends giving lorazepam and haloperidol on a prn basis, vitamins on a regular basis.  Dione Booze, MD 11/05/12 [phone]
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
## 2012-11-05 NOTE — ED Notes (Signed)
Security here ready to take pt to have a shower, but social work is at the bedside.

## 2012-11-05 NOTE — Consult Note (Signed)
Reason for Consult:  Evaluation for inpatient services Referring Physician: EDP  Lucas Small is an 47 y.o. male.  HPI: Patient previously tele-psych   11/05/2013 at 0357 AM and was recommended inpatient treatment. Patient continues to endorse SI thoughts and states that he has been formulating a plan but no active plan at this time.  Patient states that stressors or physical health related to the multiple surgeries and health conditions; wife leaving him; and now girlfriend leaving him related to sexual dysfunction; having nowhere to really live.  Patient also states that he has had decrease in appetite and is sleeping no more than 1 hour a night.  Increasing depression and unable to contract for safety. Past Medical History  Diagnosis Date  . Depression   . Alcoholic   . Hypertension   . Renal disorder   . Respiratory failure   . CHF (congestive heart failure)   . Perforation bowel   . PNA (pneumonia)   . MRSA (methicillin resistant Staphylococcus aureus)     Past Surgical History  Procedure Laterality Date  . Abdominal surgery    . Cardiac surgery    . Peripherally inserted central catheter insertion      No family history on file.  Social History:  reports that he has been smoking Cigarettes.  He has a 37.5 pack-year smoking history. He does not have any smokeless tobacco history on file. He reports that  drinks alcohol. His drug history is not on file.  Allergies:  Allergies  Allergen Reactions  . Bee Venom Anaphylaxis    Medications: I have reviewed the patient's current medications.  Results for orders placed during the hospital encounter of 11/04/12 (from the past 48 hour(s))  ACETAMINOPHEN LEVEL     Status: None   Collection Time    11/04/12  2:52 AM      Result Value Range   Acetaminophen (Tylenol), Serum <15.0  10 - 30 ug/mL   Comment:            THERAPEUTIC CONCENTRATIONS VARY     SIGNIFICANTLY. A RANGE OF 10-30     ug/mL MAY BE AN EFFECTIVE      CONCENTRATION FOR MANY PATIENTS.     HOWEVER, SOME ARE BEST TREATED     AT CONCENTRATIONS OUTSIDE THIS     RANGE.     ACETAMINOPHEN CONCENTRATIONS     >150 ug/mL AT 4 HOURS AFTER     INGESTION AND >50 ug/mL AT 12     HOURS AFTER INGESTION ARE     OFTEN ASSOCIATED WITH TOXIC     REACTIONS.  CBC     Status: Abnormal   Collection Time    11/04/12  2:52 AM      Result Value Range   WBC 11.7 (*) 4.0 - 10.5 K/uL   RBC 5.23  4.22 - 5.81 MIL/uL   Hemoglobin 17.0  13.0 - 17.0 g/dL   HCT 16.1  09.6 - 04.5 %   MCV 92.9  78.0 - 100.0 fL   MCH 32.5  26.0 - 34.0 pg   MCHC 35.0  30.0 - 36.0 g/dL   RDW 40.9 (*) 81.1 - 91.4 %   Platelets 268  150 - 400 K/uL  COMPREHENSIVE METABOLIC PANEL     Status: Abnormal   Collection Time    11/04/12  2:52 AM      Result Value Range   Sodium 139  135 - 145 mEq/L   Potassium 3.2 (*) 3.5 - 5.1  mEq/L   Chloride 101  96 - 112 mEq/L   CO2 25  19 - 32 mEq/L   Glucose, Bld 121 (*) 70 - 99 mg/dL   BUN 7  6 - 23 mg/dL   Creatinine, Ser 6.21  0.50 - 1.35 mg/dL   Calcium 30.8  8.4 - 65.7 mg/dL   Total Protein 8.1  6.0 - 8.3 g/dL   Albumin 4.0  3.5 - 5.2 g/dL   AST 15  0 - 37 U/L   ALT 13  0 - 53 U/L   Alkaline Phosphatase 178 (*) 39 - 117 U/L   Total Bilirubin 0.3  0.3 - 1.2 mg/dL   GFR calc non Af Amer >90  >90 mL/min   GFR calc Af Amer >90  >90 mL/min   Comment:            The eGFR has been calculated     using the CKD EPI equation.     This calculation has not been     validated in all clinical     situations.     eGFR's persistently     <90 mL/min signify     possible Chronic Kidney Disease.  ETHANOL     Status: Abnormal   Collection Time    11/04/12  2:52 AM      Result Value Range   Alcohol, Ethyl (B) 229 (*) 0 - 11 mg/dL   Comment:            LOWEST DETECTABLE LIMIT FOR     SERUM ALCOHOL IS 11 mg/dL     FOR MEDICAL PURPOSES ONLY  SALICYLATE LEVEL     Status: Abnormal   Collection Time    11/04/12  2:52 AM      Result Value Range    Salicylate Lvl <2.0 (*) 2.8 - 20.0 mg/dL  URINE RAPID DRUG SCREEN (HOSP PERFORMED)     Status: None   Collection Time    11/04/12 12:31 PM      Result Value Range   Opiates NONE DETECTED  NONE DETECTED   Cocaine NONE DETECTED  NONE DETECTED   Benzodiazepines NONE DETECTED  NONE DETECTED   Amphetamines NONE DETECTED  NONE DETECTED   Tetrahydrocannabinol NONE DETECTED  NONE DETECTED   Barbiturates NONE DETECTED  NONE DETECTED   Comment:            DRUG SCREEN FOR MEDICAL PURPOSES     ONLY.  IF CONFIRMATION IS NEEDED     FOR ANY PURPOSE, NOTIFY LAB     WITHIN 5 DAYS.                LOWEST DETECTABLE LIMITS     FOR URINE DRUG SCREEN     Drug Class       Cutoff (ng/mL)     Amphetamine      1000     Barbiturate      200     Benzodiazepine   200     Tricyclics       300     Opiates          300     Cocaine          300     THC              50  CBC WITH DIFFERENTIAL     Status: Abnormal   Collection Time    11/05/12  8:00 AM      Result  Value Range   WBC 11.4 (*) 4.0 - 10.5 K/uL   RBC 4.66  4.22 - 5.81 MIL/uL   Hemoglobin 15.6  13.0 - 17.0 g/dL   HCT 40.9  81.1 - 91.4 %   MCV 94.4  78.0 - 100.0 fL   MCH 33.5  26.0 - 34.0 pg   MCHC 35.5  30.0 - 36.0 g/dL   RDW 78.2 (*) 95.6 - 21.3 %   Platelets 234  150 - 400 K/uL   Neutrophils Relative % 77  43 - 77 %   Neutro Abs 8.8 (*) 1.7 - 7.7 K/uL   Lymphocytes Relative 14  12 - 46 %   Lymphs Abs 1.6  0.7 - 4.0 K/uL   Monocytes Relative 6  3 - 12 %   Monocytes Absolute 0.7  0.1 - 1.0 K/uL   Eosinophils Relative 3  0 - 5 %   Eosinophils Absolute 0.3  0.0 - 0.7 K/uL   Basophils Relative 0  0 - 1 %   Basophils Absolute 0.0  0.0 - 0.1 K/uL  COMPREHENSIVE METABOLIC PANEL     Status: Abnormal   Collection Time    11/05/12  8:00 AM      Result Value Range   Sodium 131 (*) 135 - 145 mEq/L   Comment: DELTA CHECK NOTED     REPEATED TO VERIFY   Potassium 3.9  3.5 - 5.1 mEq/L   Comment: DELTA CHECK NOTED     REPEATED TO VERIFY     SLIGHT  HEMOLYSIS   Chloride 96  96 - 112 mEq/L   CO2 25  19 - 32 mEq/L   Glucose, Bld 111 (*) 70 - 99 mg/dL   BUN 17  6 - 23 mg/dL   Creatinine, Ser 0.86  0.50 - 1.35 mg/dL   Calcium 9.4  8.4 - 57.8 mg/dL   Total Protein 6.9  6.0 - 8.3 g/dL   Albumin 3.3 (*) 3.5 - 5.2 g/dL   AST 20  0 - 37 U/L   ALT 14  0 - 53 U/L   Alkaline Phosphatase 162 (*) 39 - 117 U/L   Total Bilirubin 0.3  0.3 - 1.2 mg/dL   GFR calc non Af Amer >90  >90 mL/min   GFR calc Af Amer >90  >90 mL/min   Comment:            The eGFR has been calculated     using the CKD EPI equation.     This calculation has not been     validated in all clinical     situations.     eGFR's persistently     <90 mL/min signify     possible Chronic Kidney Disease.  LIPASE, BLOOD     Status: None   Collection Time    11/05/12  8:00 AM      Result Value Range   Lipase 23  11 - 59 U/L  LACTIC ACID, PLASMA     Status: None   Collection Time    11/05/12  8:00 AM      Result Value Range   Lactic Acid, Venous 1.7  0.5 - 2.2 mmol/L  ACETAMINOPHEN LEVEL     Status: None   Collection Time    11/05/12  8:00 AM      Result Value Range   Acetaminophen (Tylenol), Serum <15.0  10 - 30 ug/mL   Comment:            THERAPEUTIC  CONCENTRATIONS VARY     SIGNIFICANTLY. A RANGE OF 10-30     ug/mL MAY BE AN EFFECTIVE     CONCENTRATION FOR MANY PATIENTS.     HOWEVER, SOME ARE BEST TREATED     AT CONCENTRATIONS OUTSIDE THIS     RANGE.     ACETAMINOPHEN CONCENTRATIONS     >150 ug/mL AT 4 HOURS AFTER     INGESTION AND >50 ug/mL AT 12     HOURS AFTER INGESTION ARE     OFTEN ASSOCIATED WITH TOXIC     REACTIONS.  PRO B NATRIURETIC PEPTIDE     Status: None   Collection Time    11/05/12  8:00 AM      Result Value Range   Pro B Natriuretic peptide (BNP) 107.1  0 - 125 pg/mL  D-DIMER, QUANTITATIVE     Status: Abnormal   Collection Time    11/05/12  8:00 AM      Result Value Range   D-Dimer, Quant 1.46 (*) 0.00 - 0.48 ug/mL-FEU   Comment:             AT THE INHOUSE ESTABLISHED CUTOFF     VALUE OF 0.48 ug/mL FEU,     THIS ASSAY HAS BEEN DOCUMENTED     IN THE LITERATURE TO HAVE     A SENSITIVITY AND NEGATIVE     PREDICTIVE VALUE OF AT LEAST     98 TO 99%.  THE TEST RESULT     SHOULD BE CORRELATED WITH     AN ASSESSMENT OF THE CLINICAL     PROBABILITY OF DVT / VTE.  URINALYSIS, ROUTINE W REFLEX MICROSCOPIC     Status: Abnormal   Collection Time    11/05/12  9:57 AM      Result Value Range   Color, Urine YELLOW  YELLOW   APPearance CLEAR  CLEAR   Specific Gravity, Urine 1.028  1.005 - 1.030   pH 6.0  5.0 - 8.0   Glucose, UA NEGATIVE  NEGATIVE mg/dL   Hgb urine dipstick LARGE (*) NEGATIVE   Bilirubin Urine NEGATIVE  NEGATIVE   Ketones, ur NEGATIVE  NEGATIVE mg/dL   Protein, ur 30 (*) NEGATIVE mg/dL   Urobilinogen, UA 0.2  0.0 - 1.0 mg/dL   Nitrite NEGATIVE  NEGATIVE   Leukocytes, UA NEGATIVE  NEGATIVE  URINE MICROSCOPIC-ADD ON     Status: Abnormal   Collection Time    11/05/12  9:57 AM      Result Value Range   Squamous Epithelial / LPF RARE  RARE   WBC, UA 0-2  <3 WBC/hpf   RBC / HPF 21-50  <3 RBC/hpf   Bacteria, UA RARE  RARE   Crystals CA OXALATE CRYSTALS (*) NEGATIVE    Dg Chest 2 View  11/05/2012   *RADIOLOGY REPORT*  Clinical Data: Medical clearance  CHEST - 2 VIEW  Comparison: None  Findings: Heart size is normal.  No pleural effusion or edema.  No airspace consolidation identified.  Review of the visualized osseous structures is unremarkable.  IMPRESSION:  1.  No acute cardiopulmonary abnormalities.   Original Report Authenticated By: Signa Kell, M.D.   Ct Angio Chest Pe W/cm &/or Wo Cm  11/05/2012   **ADDENDUM** CREATED: 11/05/2012 11:06:24  There is a small linear filling defect within a right renal infundibulum just below calyx (image 19, series 7).  Within the left kidney there is a small of 3 mm  filling defect just inferior to a  calyx image 16, series 7.  Two small filling defects within the left and right  renal collecting systems just below the calyces.  These could represent a blood clot, debris, infectious debris, and cannot exclude uroepithelial neoplasm although less favored.  Consider urology consultation.  **END ADDENDUM** SIGNED BY: Genevive Bi, M.D.  11/05/2012   *RADIOLOGY REPORT*  Clinical Data:  Elevated D-dimer, abdominal pain, medical clearance.  CT ANGIOGRAPHY CHEST CT ABDOMEN AND PELVIS WITH CONTRAST  Technique:  Multidetector CT imaging of the chest was performed using the standard protocol during bolus administration of intravenous contrast.  Multiplanar CT image reconstructions including MIPs were obtained to evaluate the vascular anatomy. Multidetector CT imaging of the abdomen and pelvis was performed using the standard protocol during bolus administration of intravenous contrast.  Contrast: OMNIPAQUE IOHEXOL 300 MG/ML  SOLN, 50mL OMNIPAQUE IOHEXOL 350 MG/ML SOLN  Comparison:   CT abdomen 10/14/2010.  CTA CHEST  Findings:  There are no filling defects within the pulmonary arteries to suggest acute pulmonary embolism.  No acute findings of the aorta or great vessels.  No pericardial fluid.  No mediastinal lymphadenopathy.  Esophagus is normal.  Review of the lung parenchyma demonstrates mild architectural distortion at the right lung base.  No consolidation.  No evidence of infarction.   Review of the MIP images confirms the above findings.  IMPRESSION: 1.  No evidence acute pulmonary embolism. 2.  Mild interstitial lung changes of the lung bases.  CT ABDOMEN AND PELVIS  Findings: There is no focal hepatic lesion.  The gallbladder, pancreas, spleen, adrenal glands, and kidneys are normal. There is mild enhancement of the proximal left ureter at the level of the ureteral pelvic junction (image 44, series 2).  The stomach, small bowel, and colon are unchanged.  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  The colon and rectosigmoid colon are  normal.  Abdominal aorta normal caliber.  No retroperitoneal periportal lymphadenopathy.  No free fluid the pelvis.  There is a surgical anastomoses in the region of the sigmoid colon.  There is thickening tissue in the presacral space (image 79) which is new from prior.  There is a small fluid collection and calcifications within this presacral thickening.  The fluid collection measures 12 x 7 mm (image 77, series 2).  No pelvic lymphadenopathy. Review of  bone windows demonstrates no aggressive osseous lesions.  Review of the MIP images confirms the above findings.  IMPRESSION:  1.  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 Small parastomal hernia.  2.  New presacral thickening with small fluid collection. This findings suggest a chronic postsurgical collection.  Cannot exclude a small abscess. 3.  Mild enhancement of the epithelium of the proximal left ureter. Recommend correlation with urinary tract infection.  Findings discussed with Dr. Manus Gunning  on 11/05/2012  Original Report Authenticated By: Genevive Bi, M.D.   Ct Abdomen Pelvis W Contrast  11/05/2012   **ADDENDUM** CREATED: 11/05/2012 11:06:24  There is a small linear filling defect within a right renal infundibulum just below calyx (image 19, series 7).  Within the left kidney there is a small of 3 mm  filling defect just inferior to a calyx image 16, series 7.  Two small filling defects within the left and right renal collecting systems just below the calyces.  These could represent a blood clot, debris, infectious debris, and cannot exclude uroepithelial neoplasm although less favored.  Consider urology consultation.  **END ADDENDUM** SIGNED  BY: Genevive Bi, M.D.  11/05/2012   *RADIOLOGY REPORT*  Clinical Data:  Elevated D-dimer, abdominal pain, medical clearance.  CT ANGIOGRAPHY CHEST CT ABDOMEN AND PELVIS WITH CONTRAST  Technique:  Multidetector CT imaging of the chest was performed using the standard protocol during bolus  administration of intravenous contrast.  Multiplanar CT image reconstructions including MIPs were obtained to evaluate the vascular anatomy. Multidetector CT imaging of the abdomen and pelvis was performed using the standard protocol during bolus administration of intravenous contrast.  Contrast: OMNIPAQUE IOHEXOL 300 MG/ML  SOLN, 50mL OMNIPAQUE IOHEXOL 350 MG/ML SOLN  Comparison:   CT abdomen 10/14/2010.  CTA CHEST  Findings:  There are no filling defects within the pulmonary arteries to suggest acute pulmonary embolism.  No acute findings of the aorta or great vessels.  No pericardial fluid.  No mediastinal lymphadenopathy.  Esophagus is normal.  Review of the lung parenchyma demonstrates mild architectural distortion at the right lung base.  No consolidation.  No evidence of infarction.   Review of the MIP images confirms the above findings.  IMPRESSION: 1.  No evidence acute pulmonary embolism. 2.  Mild interstitial lung changes of the lung bases.  CT ABDOMEN AND PELVIS  Findings: There is no focal hepatic lesion.  The gallbladder, pancreas, spleen, adrenal glands, and kidneys are normal. There is mild enhancement of the proximal left ureter at the level of the ureteral pelvic junction (image 44, series 2).  The stomach, small bowel, and colon are unchanged.  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  The colon and rectosigmoid colon are normal.  Abdominal aorta normal caliber.  No retroperitoneal periportal lymphadenopathy.  No free fluid the pelvis.  There is a surgical anastomoses in the region of the sigmoid colon.  There is thickening tissue in the presacral space (image 79) which is new from prior.  There is a small fluid collection and calcifications within this presacral thickening.  The fluid collection measures 12 x 7 mm (image 77, series 2).  No pelvic lymphadenopathy. Review of  bone windows demonstrates no aggressive osseous lesions.  Review of the  MIP images confirms the above findings.  IMPRESSION:  1.  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 Small parastomal hernia.  2.  New presacral thickening with small fluid collection. This findings suggest a chronic postsurgical collection.  Cannot exclude a small abscess. 3.  Mild enhancement of the epithelium of the proximal left ureter. Recommend correlation with urinary tract infection.  Findings discussed with Dr. Manus Gunning  on 11/05/2012  Original Report Authenticated By: Genevive Bi, M.D.    Review of Systems  HENT: Negative.  Negative for sore throat.   Respiratory: Negative for cough.   Gastrointestinal: Negative for nausea and vomiting.       Decreased appetite   Genitourinary:       Ostomy.  Patient states that he doesn't "pee"  Neurological: Negative for dizziness and tingling.  Psychiatric/Behavioral: Positive for depression and suicidal ideas (Patient states that he is SI; doesn't have a plan but "I am formulating one"). Negative for hallucinations. The patient is nervous/anxious and has insomnia (States sleeps one hour a day).    Blood pressure 113/73, pulse 98, temperature 97.9 F (36.6 C), temperature source Oral, resp. rate 12, height 5\' 9"  (1.753 m), weight 74.844 kg (165 lb), SpO2 97.00%. Physical Exam  Constitutional: He is oriented to person, place, and time. He appears well-developed.  HENT:  Head: Normocephalic.  Eyes: Pupils are equal, round, and reactive to light.  Neck: Normal range of motion.  Respiratory: Effort normal.  GI:  Ostomy.  Renal failure   Neurological: He is alert and oriented to person, place, and time.  Skin: Skin is warm and dry. Rash (scabies) noted.  Psychiatric: His speech is normal and behavior is normal. His mood appears anxious. Thought content is not paranoid. He exhibits a depressed mood. He expresses suicidal ideation. He expresses no homicidal ideation. He expresses no suicidal plans.    Assessment/Plan:  Face to  face interview and consulted with Dr. Lolly Mustache Recommendation:  Inpatient treatment  1. Admit for crisis management and stabilization.  2. Review and initiate  medications pertinent to patient illness and treatment.  3. Medication management to reduce current symptoms to base line and improve the         patient's overall level of functioning.   Patient will be reviewed again for admission to [facility] 24 hours after treatment for scabies.    Rankin, Shuvon  FNP-BC 11/05/2012, 11:23 AM     I agreed with the findings and involved in the treatment plan.

## 2012-11-05 NOTE — ED Notes (Signed)
Telepsych consult ongoing at this time.

## 2012-11-05 NOTE — ED Notes (Signed)
Patient is aware we need a urine specimen. 

## 2012-11-05 NOTE — Progress Notes (Signed)
CSW referred pt to [facility], pending review.  CSW spoke with Agustin Cree at [facility] who stated that as long as patient can care for ileostomy bag, that patient would be a candidate once cleared for 24 hours with scabies. Darlene directed referral to be sent tomorrow.   Catha Gosselin, LCSWA  [phone] .11/05/2012 11:18am

## 2012-11-05 NOTE — ED Provider Notes (Signed)
Medical clearance patient's with suicidal thoughts and alcohol abuse. He has been persistently tachycardic since his arrival 29 hours ago. Denies any change in his chronic back pain. CIWA scores have been 3, does not appear to be withdrawing. No documented fevers. WBC 11.7 on initial labs. On exam, no distress, tachycardic, feels warm. Lungs clear.  Abdomen soft with pink stoma and reducible peristomal hernia. We'll obtain rectal temp, EKG, IV fluid trial, urinalysis, CXR. Now febrile to 100.7. Will repeat labs.  BP 105/72  Pulse 119  Temp(Src) 100.7 F (38.2 C) (Rectal)  Resp 18  Ht 5\' 9"  (1.753 m)  Wt 165 lb (74.844 kg)  BMI 24.36 kg/m2  SpO2 97%  HR improved to 90s with IVF. No PE on CT. No PNA.  UA with hematuria, no infection. CT abdomen d/w Dr. Amil Amen.  Fluid collection appears postoperative, too small to drain or characterize.  CT results d/w Dr. Sherron Monday of urology.  He recommends urine culture, 10 days of cipro, and follow up in the office for CT cystogram and possibily cystoscopy. Dr. Sherron Monday doubts neoplasm but further evaluation will be needed.  HR has improved to 80-90s with IVF. Orthostatics positive. Patient tolerating PO, no tremors, denies abdominal pain.     Date: 11/05/2012  Rate: 115  Rhythm: sinus tachycardia  QRS Axis: normal  Intervals: normal  ST/T Wave abnormalities: normal  Conduction Disutrbances:none  Narrative Interpretation:   Old EKG Reviewed: rate faster    Glynn Octave, MD 11/05/12 1437

## 2012-11-06 ENCOUNTER — Inpatient Hospital Stay (HOSPITAL_COMMUNITY)
Admission: AD | Admit: 2012-11-06 | Discharge: 2012-11-10 | DRG: 885 | Disposition: A | Discharge status: Home / Self Care | Payer: Federal, State, Local not specified - Other | Source: Intra-hospital | Attending: Emergency Medicine | Admitting: Emergency Medicine

## 2012-11-06 ENCOUNTER — Encounter (HOSPITAL_COMMUNITY): Payer: Self-pay | Admitting: *Deleted

## 2012-11-06 DIAGNOSIS — I10 Essential (primary) hypertension: Secondary | ICD-10-CM | POA: Diagnosis present

## 2012-11-06 DIAGNOSIS — F1010 Alcohol abuse, uncomplicated: Secondary | ICD-10-CM | POA: Diagnosis present

## 2012-11-06 DIAGNOSIS — Z79899 Other long term (current) drug therapy: Secondary | ICD-10-CM

## 2012-11-06 DIAGNOSIS — K469 Unspecified abdominal hernia without obstruction or gangrene: Secondary | ICD-10-CM

## 2012-11-06 DIAGNOSIS — R45851 Suicidal ideations: Secondary | ICD-10-CM

## 2012-11-06 DIAGNOSIS — F1020 Alcohol dependence, uncomplicated: Secondary | ICD-10-CM

## 2012-11-06 DIAGNOSIS — I509 Heart failure, unspecified: Secondary | ICD-10-CM | POA: Diagnosis present

## 2012-11-06 DIAGNOSIS — F332 Major depressive disorder, recurrent severe without psychotic features: Principal | ICD-10-CM | POA: Diagnosis present

## 2012-11-06 DIAGNOSIS — F1994 Other psychoactive substance use, unspecified with psychoactive substance-induced mood disorder: Secondary | ICD-10-CM

## 2012-11-06 HISTORY — DX: Unspecified kidney failure: N19

## 2012-11-06 LAB — URINE CULTURE

## 2012-11-06 MED ORDER — LAMOTRIGINE 100 MG PO TABS: 100.0000 mg | ORAL_TABLET | Freq: Every day | ORAL | Status: DC

## 2012-11-06 MED ORDER — TRAZODONE HCL 100 MG PO TABS: 100.0000 mg | ORAL_TABLET | Freq: Every evening | ORAL | Status: DC | PRN

## 2012-11-06 MED ORDER — FENTANYL 25 MCG/HR TD PT72: 25.0000 ug | MEDICATED_PATCH | TRANSDERMAL | Status: DC

## 2012-11-06 MED ORDER — ASPIRIN 325 MG PO TABS: 325.0000 mg | ORAL_TABLET | Freq: Every day | ORAL | Status: DC

## 2012-11-06 MED ORDER — NICOTINE 14 MG/24HR TD PT24
14.0000 mg | MEDICATED_PATCH | Freq: Once | TRANSDERMAL | Status: AC
  Administered 2012-11-06: 14 mg via TRANSDERMAL
  Filled 2012-11-06: qty 1

## 2012-11-06 MED ORDER — METOPROLOL SUCCINATE ER 100 MG PO TB24: 100.0000 mg | ORAL_TABLET | Freq: Every day | ORAL | Status: DC

## 2012-11-06 MED ORDER — ACETAMINOPHEN 325 MG PO TABS: 650.0000 mg | ORAL_TABLET | Freq: Four times a day (QID) | ORAL | Status: DC | PRN

## 2012-11-06 MED ORDER — ASPIRIN 325 MG PO TABS
325.0000 mg | ORAL_TABLET | Freq: Once | ORAL | Status: AC
  Administered 2012-11-06: 325 mg via ORAL
  Filled 2012-11-06 (×2): qty 1

## 2012-11-06 MED ORDER — DILTIAZEM HCL ER 180 MG PO CP24: 180.0000 mg | ORAL_CAPSULE | Freq: Every day | ORAL | Status: DC

## 2012-11-06 MED ORDER — DILTIAZEM HCL ER 180 MG PO CP24
180.0000 mg | ORAL_CAPSULE | Freq: Every day | ORAL | Status: DC
Start: 2012-11-06 — End: 2012-11-06

## 2012-11-06 MED ORDER — MAGNESIUM HYDROXIDE 400 MG/5ML PO SUSP: 30.0000 mL | Freq: Every day | ORAL | Status: DC | PRN

## 2012-11-06 MED ORDER — HALOPERIDOL 5 MG PO TABS: 5.0000 mg | ORAL_TABLET | Freq: Four times a day (QID) | ORAL | Status: DC | PRN

## 2012-11-06 MED ORDER — POLYETHYLENE GLYCOL 3350 17 G PO PACK
17.0000 g | PACK | Freq: Two times a day (BID) | ORAL | Status: DC
  Administered 2012-11-09: 17 g via ORAL
  Filled 2012-11-06 (×14): qty 1

## 2012-11-06 MED ORDER — ADULT MULTIVITAMIN W/MINERALS CH: 1.0000 | ORAL_TABLET | Freq: Every day | ORAL | Status: DC

## 2012-11-06 MED ORDER — ALUM & MAG HYDROXIDE-SIMETH 200-200-20 MG/5ML PO SUSP: 30.0000 mL | ORAL | Status: DC | PRN

## 2012-11-06 MED ORDER — CITALOPRAM HYDROBROMIDE 20 MG PO TABS: 20.0000 mg | ORAL_TABLET | Freq: Every day | ORAL | Status: DC

## 2012-11-06 MED ORDER — CIPROFLOXACIN HCL 750 MG PO TABS
750.0000 mg | ORAL_TABLET | Freq: Two times a day (BID) | ORAL | Status: DC
  Administered 2012-11-06 – 2012-11-10 (×7): 750 mg via ORAL
  Filled 2012-11-06 (×8): qty 1
  Filled 2012-11-06: qty 2
  Filled 2012-11-06: qty 3
  Filled 2012-11-06 (×5): qty 1
  Filled 2012-11-06: qty 3
  Filled 2012-11-06: qty 1

## 2012-11-06 MED ORDER — ACETAMINOPHEN 325 MG PO TABS: 650.0000 mg | ORAL_TABLET | ORAL | Status: DC | PRN

## 2012-11-06 MED ORDER — FOLIC ACID 1 MG PO TABS: 1.0000 mg | ORAL_TABLET | Freq: Every day | ORAL | Status: DC

## 2012-11-06 MED ORDER — NICOTINE 14 MG/24HR TD PT24: 14.0000 mg | MEDICATED_PATCH | Freq: Every day | TRANSDERMAL | Status: DC

## 2012-11-06 MED ORDER — LORAZEPAM 1 MG PO TABS: 1.0000 mg | ORAL_TABLET | Freq: Three times a day (TID) | ORAL | Status: DC | PRN

## 2012-11-06 MED ORDER — ACETAMINOPHEN 325 MG PO TABS
650.0000 mg | ORAL_TABLET | Freq: Four times a day (QID) | ORAL | Status: DC | PRN
  Administered 2012-11-07: 650 mg via ORAL
  Filled 2012-11-06 (×2): qty 1

## 2012-11-06 NOTE — Consult Note (Signed)
Reason for Consult:  Follow Evaluation for inpatient services Referring Physician: EDP  KAYLOB WALLEN is an 47 y.o. male.  HPI: Patient continues to have worsening depression related to surgeries, physical health, loss of wife, and recent loss of girlfriend.  Patient states that in the last year depression has worsened.  When patient ask SI thoughts patient states that he continues to be suicidal without a current plan.  Patient states "I don't have to kill myself the surgeons are doing it for me.."  Patient however states that today he is having violent thoughts towards "people who have pissed me off."  Passive violent thoughts without plan.  Patient states "I need help.  My life and body is messed up and I just need help."  Past Medical History  Diagnosis Date  . Depression   . Alcoholic   . Hypertension   . Renal disorder   . Respiratory failure   . CHF (congestive heart failure)   . Perforation bowel   . PNA (pneumonia)   . MRSA (methicillin resistant Staphylococcus aureus)     Past Surgical History  Procedure Laterality Date  . Abdominal surgery    . Cardiac surgery    . Peripherally inserted central catheter insertion      No family history on file.  Social History:  reports that he has been smoking Cigarettes.  He has a 37.5 pack-year smoking history. He does not have any smokeless tobacco history on file. He reports that  drinks alcohol. His drug history is not on file.  Allergies:  Allergies  Allergen Reactions  . Bee Venom Anaphylaxis    Medications: I have reviewed the patient's current medications.  Results for orders placed during the hospital encounter of 11/04/12 (from the past 48 hour(s))  URINE RAPID DRUG SCREEN (HOSP PERFORMED)     Status: None   Collection Time    11/04/12 12:31 PM      Result Value Range   Opiates NONE DETECTED  NONE DETECTED   Cocaine NONE DETECTED  NONE DETECTED   Benzodiazepines NONE DETECTED  NONE DETECTED   Amphetamines NONE  DETECTED  NONE DETECTED   Tetrahydrocannabinol NONE DETECTED  NONE DETECTED   Barbiturates NONE DETECTED  NONE DETECTED   Comment:            DRUG SCREEN FOR MEDICAL PURPOSES     ONLY.  IF CONFIRMATION IS NEEDED     FOR ANY PURPOSE, NOTIFY LAB     WITHIN 5 DAYS.                LOWEST DETECTABLE LIMITS     FOR URINE DRUG SCREEN     Drug Class       Cutoff (ng/mL)     Amphetamine      1000     Barbiturate      200     Benzodiazepine   200     Tricyclics       300     Opiates          300     Cocaine          300     THC              50  CBC WITH DIFFERENTIAL     Status: Abnormal   Collection Time    11/05/12  8:00 AM      Result Value Range   WBC 11.4 (*) 4.0 - 10.5 K/uL  RBC 4.66  4.22 - 5.81 MIL/uL   Hemoglobin 15.6  13.0 - 17.0 g/dL   HCT 16.1  09.6 - 04.5 %   MCV 94.4  78.0 - 100.0 fL   MCH 33.5  26.0 - 34.0 pg   MCHC 35.5  30.0 - 36.0 g/dL   RDW 40.9 (*) 81.1 - 91.4 %   Platelets 234  150 - 400 K/uL   Neutrophils Relative % 77  43 - 77 %   Neutro Abs 8.8 (*) 1.7 - 7.7 K/uL   Lymphocytes Relative 14  12 - 46 %   Lymphs Abs 1.6  0.7 - 4.0 K/uL   Monocytes Relative 6  3 - 12 %   Monocytes Absolute 0.7  0.1 - 1.0 K/uL   Eosinophils Relative 3  0 - 5 %   Eosinophils Absolute 0.3  0.0 - 0.7 K/uL   Basophils Relative 0  0 - 1 %   Basophils Absolute 0.0  0.0 - 0.1 K/uL  COMPREHENSIVE METABOLIC PANEL     Status: Abnormal   Collection Time    11/05/12  8:00 AM      Result Value Range   Sodium 131 (*) 135 - 145 mEq/L   Comment: DELTA CHECK NOTED     REPEATED TO VERIFY   Potassium 3.9  3.5 - 5.1 mEq/L   Comment: DELTA CHECK NOTED     REPEATED TO VERIFY     SLIGHT HEMOLYSIS   Chloride 96  96 - 112 mEq/L   CO2 25  19 - 32 mEq/L   Glucose, Bld 111 (*) 70 - 99 mg/dL   BUN 17  6 - 23 mg/dL   Creatinine, Ser 7.82  0.50 - 1.35 mg/dL   Calcium 9.4  8.4 - 95.6 mg/dL   Total Protein 6.9  6.0 - 8.3 g/dL   Albumin 3.3 (*) 3.5 - 5.2 g/dL   AST 20  0 - 37 U/L   ALT 14  0 - 53  U/L   Alkaline Phosphatase 162 (*) 39 - 117 U/L   Total Bilirubin 0.3  0.3 - 1.2 mg/dL   GFR calc non Af Amer >90  >90 mL/min   GFR calc Af Amer >90  >90 mL/min   Comment:            The eGFR has been calculated     using the CKD EPI equation.     This calculation has not been     validated in all clinical     situations.     eGFR's persistently     <90 mL/min signify     possible Chronic Kidney Disease.  LIPASE, BLOOD     Status: None   Collection Time    11/05/12  8:00 AM      Result Value Range   Lipase 23  11 - 59 U/L  LACTIC ACID, PLASMA     Status: None   Collection Time    11/05/12  8:00 AM      Result Value Range   Lactic Acid, Venous 1.7  0.5 - 2.2 mmol/L  ACETAMINOPHEN LEVEL     Status: None   Collection Time    11/05/12  8:00 AM      Result Value Range   Acetaminophen (Tylenol), Serum <15.0  10 - 30 ug/mL   Comment:            THERAPEUTIC CONCENTRATIONS VARY     SIGNIFICANTLY. A RANGE OF 10-30  ug/mL MAY BE AN EFFECTIVE     CONCENTRATION FOR MANY PATIENTS.     HOWEVER, SOME ARE BEST TREATED     AT CONCENTRATIONS OUTSIDE THIS     RANGE.     ACETAMINOPHEN CONCENTRATIONS     >150 ug/mL AT 4 HOURS AFTER     INGESTION AND >50 ug/mL AT 12     HOURS AFTER INGESTION ARE     OFTEN ASSOCIATED WITH TOXIC     REACTIONS.  PRO B NATRIURETIC PEPTIDE     Status: None   Collection Time    11/05/12  8:00 AM      Result Value Range   Pro B Natriuretic peptide (BNP) 107.1  0 - 125 pg/mL  D-DIMER, QUANTITATIVE     Status: Abnormal   Collection Time    11/05/12  8:00 AM      Result Value Range   D-Dimer, Quant 1.46 (*) 0.00 - 0.48 ug/mL-FEU   Comment:            AT THE INHOUSE ESTABLISHED CUTOFF     VALUE OF 0.48 ug/mL FEU,     THIS ASSAY HAS BEEN DOCUMENTED     IN THE LITERATURE TO HAVE     A SENSITIVITY AND NEGATIVE     PREDICTIVE VALUE OF AT LEAST     98 TO 99%.  THE TEST RESULT     SHOULD BE CORRELATED WITH     AN ASSESSMENT OF THE CLINICAL     PROBABILITY  OF DVT / VTE.  CULTURE, BLOOD (ROUTINE X 2)     Status: None   Collection Time    11/05/12  8:00 AM      Result Value Range   Specimen Description BLOOD RIGHT ANTECUBITAL     Special Requests BOTTLES DRAWN AEROBIC AND ANAEROBIC 2CC     Culture  Setup Time 11/05/2012 12:06     Culture       Value:        BLOOD CULTURE RECEIVED NO GROWTH TO DATE CULTURE WILL BE HELD FOR 5 DAYS BEFORE ISSUING A FINAL NEGATIVE REPORT   Report Status PENDING    CULTURE, BLOOD (ROUTINE X 2)     Status: None   Collection Time    11/05/12  8:25 AM      Result Value Range   Specimen Description BLOOD LEFT ANTECUBITAL     Special Requests BOTTLES DRAWN AEROBIC AND ANAEROBIC 2CC     Culture  Setup Time 11/05/2012 12:06     Culture       Value:        BLOOD CULTURE RECEIVED NO GROWTH TO DATE CULTURE WILL BE HELD FOR 5 DAYS BEFORE ISSUING A FINAL NEGATIVE REPORT   Report Status PENDING    URINALYSIS, ROUTINE W REFLEX MICROSCOPIC     Status: Abnormal   Collection Time    11/05/12  9:57 AM      Result Value Range   Color, Urine YELLOW  YELLOW   APPearance CLEAR  CLEAR   Specific Gravity, Urine 1.028  1.005 - 1.030   pH 6.0  5.0 - 8.0   Glucose, UA NEGATIVE  NEGATIVE mg/dL   Hgb urine dipstick LARGE (*) NEGATIVE   Bilirubin Urine NEGATIVE  NEGATIVE   Ketones, ur NEGATIVE  NEGATIVE mg/dL   Protein, ur 30 (*) NEGATIVE mg/dL   Urobilinogen, UA 0.2  0.0 - 1.0 mg/dL   Nitrite NEGATIVE  NEGATIVE   Leukocytes, UA NEGATIVE  NEGATIVE  URINE MICROSCOPIC-ADD ON     Status: Abnormal   Collection Time    11/05/12  9:57 AM      Result Value Range   Squamous Epithelial / LPF RARE  RARE   WBC, UA 0-2  <3 WBC/hpf   RBC / HPF 21-50  <3 RBC/hpf   Bacteria, UA RARE  RARE   Crystals CA OXALATE CRYSTALS (*) NEGATIVE    Dg Chest 2 View  11/05/2012   *RADIOLOGY REPORT*  Clinical Data: Medical clearance  CHEST - 2 VIEW  Comparison: None  Findings: Heart size is normal.  No pleural effusion or edema.  No airspace  consolidation identified.  Review of the visualized osseous structures is unremarkable.  IMPRESSION:  1.  No acute cardiopulmonary abnormalities.   Original Report Authenticated By: Signa Kell, M.D.   Ct Angio Chest Pe W/cm &/or Wo Cm  11/05/2012   **ADDENDUM** CREATED: 11/05/2012 11:06:24  There is a small linear filling defect within a right renal infundibulum just below calyx (image 19, series 7).  Within the left kidney there is a small of 3 mm  filling defect just inferior to a calyx image 16, series 7.  Two small filling defects within the left and right renal collecting systems just below the calyces.  These could represent a blood clot, debris, infectious debris, and cannot exclude uroepithelial neoplasm although less favored.  Consider urology consultation.  **END ADDENDUM** SIGNED BY: Genevive Bi, M.D.  11/05/2012   *RADIOLOGY REPORT*  Clinical Data:  Elevated D-dimer, abdominal pain, medical clearance.  CT ANGIOGRAPHY CHEST CT ABDOMEN AND PELVIS WITH CONTRAST  Technique:  Multidetector CT imaging of the chest was performed using the standard protocol during bolus administration of intravenous contrast.  Multiplanar CT image reconstructions including MIPs were obtained to evaluate the vascular anatomy. Multidetector CT imaging of the abdomen and pelvis was performed using the standard protocol during bolus administration of intravenous contrast.  Contrast: OMNIPAQUE IOHEXOL 300 MG/ML  SOLN, 50mL OMNIPAQUE IOHEXOL 350 MG/ML SOLN  Comparison:   CT abdomen 10/14/2010.  CTA CHEST  Findings:  There are no filling defects within the pulmonary arteries to suggest acute pulmonary embolism.  No acute findings of the aorta or great vessels.  No pericardial fluid.  No mediastinal lymphadenopathy.  Esophagus is normal.  Review of the lung parenchyma demonstrates mild architectural distortion at the right lung base.  No consolidation.  No evidence of infarction.   Review of the MIP images confirms the  above findings.  IMPRESSION: 1.  No evidence acute pulmonary embolism. 2.  Mild interstitial lung changes of the lung bases.  CT ABDOMEN AND PELVIS  Findings: There is no focal hepatic lesion.  The gallbladder, pancreas, spleen, adrenal glands, and kidneys are normal. There is mild enhancement of the proximal left ureter at the level of the ureteral pelvic junction (image 44, series 2).  The stomach, small bowel, and colon are unchanged.  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  The colon and rectosigmoid colon are normal.  Abdominal aorta normal caliber.  No retroperitoneal periportal lymphadenopathy.  No free fluid the pelvis.  There is a surgical anastomoses in the region of the sigmoid colon.  There is thickening tissue in the presacral space (image 79) which is new from prior.  There is a small fluid collection and calcifications within this presacral thickening.  The fluid collection measures 12 x 7 mm (image 77, series 2).  No pelvic  lymphadenopathy. Review of  bone windows demonstrates no aggressive osseous lesions.  Review of the MIP images confirms the above findings.  IMPRESSION:  1.  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 Small parastomal hernia.  2.  New presacral thickening with small fluid collection. This findings suggest a chronic postsurgical collection.  Cannot exclude a small abscess. 3.  Mild enhancement of the epithelium of the proximal left ureter. Recommend correlation with urinary tract infection.  Findings discussed with Dr. Manus Gunning  on 11/05/2012  Original Report Authenticated By: Genevive Bi, M.D.   Ct Abdomen Pelvis W Contrast  11/05/2012   **ADDENDUM** CREATED: 11/05/2012 11:06:24  There is a small linear filling defect within a right renal infundibulum just below calyx (image 19, series 7).  Within the left kidney there is a small of 3 mm  filling defect just inferior to a calyx image 16, series 7.  Two small  filling defects within the left and right renal collecting systems just below the calyces.  These could represent a blood clot, debris, infectious debris, and cannot exclude uroepithelial neoplasm although less favored.  Consider urology consultation.  **END ADDENDUM** SIGNED BY: Genevive Bi, M.D.  11/05/2012   *RADIOLOGY REPORT*  Clinical Data:  Elevated D-dimer, abdominal pain, medical clearance.  CT ANGIOGRAPHY CHEST CT ABDOMEN AND PELVIS WITH CONTRAST  Technique:  Multidetector CT imaging of the chest was performed using the standard protocol during bolus administration of intravenous contrast.  Multiplanar CT image reconstructions including MIPs were obtained to evaluate the vascular anatomy. Multidetector CT imaging of the abdomen and pelvis was performed using the standard protocol during bolus administration of intravenous contrast.  Contrast: OMNIPAQUE IOHEXOL 300 MG/ML  SOLN, 50mL OMNIPAQUE IOHEXOL 350 MG/ML SOLN  Comparison:   CT abdomen 10/14/2010.  CTA CHEST  Findings:  There are no filling defects within the pulmonary arteries to suggest acute pulmonary embolism.  No acute findings of the aorta or great vessels.  No pericardial fluid.  No mediastinal lymphadenopathy.  Esophagus is normal.  Review of the lung parenchyma demonstrates mild architectural distortion at the right lung base.  No consolidation.  No evidence of infarction.   Review of the MIP images confirms the above findings.  IMPRESSION: 1.  No evidence acute pulmonary embolism. 2.  Mild interstitial lung changes of the lung bases.  CT ABDOMEN AND PELVIS  Findings: There is no focal hepatic lesion.  The gallbladder, pancreas, spleen, adrenal glands, and kidneys are normal. There is mild enhancement of the proximal left ureter at the level of the ureteral pelvic junction (image 44, series 2).  The stomach, small bowel, and colon are unchanged.  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   The colon and rectosigmoid colon are normal.  Abdominal aorta normal caliber.  No retroperitoneal periportal lymphadenopathy.  No free fluid the pelvis.  There is a surgical anastomoses in the region of the sigmoid colon.  There is thickening tissue in the presacral space (image 79) which is new from prior.  There is a small fluid collection and calcifications within this presacral thickening.  The fluid collection measures 12 x 7 mm (image 77, series 2).  No pelvic lymphadenopathy. Review of  bone windows demonstrates no aggressive osseous lesions.  Review of the MIP images confirms the above findings.  IMPRESSION:  1.  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 Small parastomal hernia.  2.  New presacral thickening with small fluid collection. This findings  suggest a chronic postsurgical collection.  Cannot exclude a small abscess. 3.  Mild enhancement of the epithelium of the proximal left ureter. Recommend correlation with urinary tract infection.  Findings discussed with Dr. Manus Gunning  on 11/05/2012  Original Report Authenticated By: Genevive Bi, M.D.    Review of Systems  HENT: Negative.  Negative for sore throat.   Respiratory: Negative for cough.   Gastrointestinal: Negative for nausea and vomiting.       Decreased appetite   Genitourinary:       Ostomy.  Patient states that he doesn't "pee"  Neurological: Negative for dizziness and tingling.  Psychiatric/Behavioral: Positive for depression and suicidal ideas (Patient states that he is SI; doesn't have a plan but "I am formulating one"). Negative for hallucinations. The patient is nervous/anxious and has insomnia (States sleeps one hour a day).    Blood pressure 106/63, pulse 96, temperature 98.6 F (37 C), temperature source Oral, resp. rate 18, height 5\' 9"  (1.753 m), weight 74.844 kg (165 lb), SpO2 96.00%. Physical Exam  Constitutional: He is oriented to person, place, and time. He appears well-developed.  HENT:  Head:  Normocephalic.  Eyes: Pupils are equal, round, and reactive to light.  Neck: Normal range of motion.  Respiratory: Effort normal.  GI:  Ostomy.  Renal failure   Neurological: He is alert and oriented to person, place, and time.  Skin: Skin is warm and dry. Rash (scabies) noted.  Psychiatric: His speech is normal and behavior is normal. His mood appears anxious. Thought content is not paranoid. He exhibits a depressed mood. He expresses suicidal ideation. He expresses no homicidal ideation. He expresses no suicidal plans.    Assessment/Plan:  Face to face interview and consulted with Dr. Lolly Mustache Recommendation:  Inpatient treatment  1. Admit for crisis management and stabilization.  2. Review and initiate  medications pertinent to patient illness and treatment.  3. Medication management to reduce current symptoms to base line and improve the         patient's overall level of functioning.  4.Patient will be reviewed again for admission to [facility] 24 hours after treatment for scabies.    11/06/2012 Follow up consult: Face to face/consult Dr. Lolly Mustache Will continue with previous recommendations to admit patient to [facility] Start Cipro for 10 days (Dr. Sherron Monday of urology recommends urine culture, 10 days of Cipro, and follow up in the office for CT cystogram and possibly cystoscopy).   Rankin, Shuvon  FNP-BC 11/06/2012, 11:20 AM       I personally seen the patient agreed with the findings and involved in the treatment plan.

## 2012-11-06 NOTE — [facility] Group Notes (Signed)
[facility] LCSW Group Therapy  [facility]  1:15 - 3: 30          11/06/2012  1:15 PM    Type of Therapy:  Group Therapy  Participation Level:  Patient did not attend group.  Wynn Banker 11/06/2012  1:15 PM

## 2012-11-06 NOTE — [facility] Counselor (Signed)
Patient accepted to [facility] by Alvy Beal, NP to Dr. Elsie Saas. The room assignment is 505-2. Nursing call report # is [phone].
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
## 2012-11-06 NOTE — Tx Team (Signed)
Initial Interdisciplinary Treatment Plan  PATIENT STRENGTHS: (choose at least two) Average or above average intelligence Communication skills Motivation for treatment/growth  PATIENT STRESSORS: Financial difficulties Health problems Marital or family conflict Medication change or noncompliance Substance abuse   PROBLEM LIST: Problem List/Patient Goals Date to be addressed Date deferred Reason deferred Estimated date of resolution  Suicidal ideation 11/06/2012   D/c        Substance abuse  11/06/2012   D/c        depression 11/06/2012   D/c                           DISCHARGE CRITERIA:  Ability to meet basic life and health needs Adequate post-discharge living arrangements Improved stabilization in mood, thinking, and/or behavior Medical problems require only outpatient monitoring Motivation to continue treatment in a less acute level of care Need for constant or close observation no longer present Reduction of life-threatening or endangering symptoms to within safe limits Safe-care adequate arrangements made Verbal commitment to aftercare and medication compliance  PRELIMINARY DISCHARGE PLAN: Attend aftercare/continuing care group Attend PHP/IOP Attend 12-step recovery group Outpatient therapy Placement in alternative living arrangements  PATIENT/FAMIILY INVOLVEMENT: This treatment plan has been presented to and reviewed with the patient, ARRINGTON YOHE.  The patient and family have been given the opportunity to ask questions and make suggestions.  Quintella Reichert [place] 11/06/2012, 6:04 PM

## 2012-11-07 DIAGNOSIS — F411 Generalized anxiety disorder: Secondary | ICD-10-CM

## 2012-11-07 MED ORDER — METOPROLOL SUCCINATE ER 100 MG PO TB24
100.0000 mg | ORAL_TABLET | Freq: Every day | ORAL | Status: DC
  Administered 2012-11-07 – 2012-11-10 (×4): 100 mg via ORAL
  Filled 2012-11-07 (×7): qty 1

## 2012-11-07 MED ORDER — FENTANYL 25 MCG/HR TD PT72
25.0000 ug | MEDICATED_PATCH | TRANSDERMAL | Status: DC
  Administered 2012-11-07: 25 ug via TRANSDERMAL
  Filled 2012-11-07: qty 1

## 2012-11-07 MED ORDER — TRAZODONE HCL 50 MG PO TABS
50.0000 mg | ORAL_TABLET | Freq: Every evening | ORAL | Status: DC | PRN
Start: 2012-11-07 — End: 2012-11-08
  Administered 2012-11-08: 50 mg via ORAL
  Filled 2012-11-07: qty 2
  Filled 2012-11-07 (×2): qty 1

## 2012-11-07 MED ORDER — TRAMADOL HCL 50 MG PO TABS
50.0000 mg | ORAL_TABLET | Freq: Four times a day (QID) | ORAL | Status: DC | PRN
  Administered 2012-11-07 – 2012-11-10 (×8): 50 mg via ORAL
  Filled 2012-11-07 (×8): qty 1

## 2012-11-07 MED ORDER — FENTANYL 25 MCG/HR TD PT72: 25.0000 ug | MEDICATED_PATCH | TRANSDERMAL | Status: DC

## 2012-11-07 MED ORDER — DILTIAZEM HCL ER COATED BEADS 180 MG PO CP24
180.0000 mg | ORAL_CAPSULE | Freq: Every day | ORAL | Status: DC
  Administered 2012-11-07 – 2012-11-10 (×4): 180 mg via ORAL
  Filled 2012-11-07 (×7): qty 1

## 2012-11-07 MED ORDER — FENTANYL 50 MCG/HR TD PT72: 50.0000 ug | MEDICATED_PATCH | TRANSDERMAL | Status: DC
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
## 2012-11-07 NOTE — [facility] Group Notes (Signed)
[facility] LCSW Group Therapy  Feelings Around Relapse 1:15 -2:30        11/07/2012 2:47 PM  Type of Therapy:  Group Therapy  Participation Level:  Appropriate  Participation Quality:  Appropriate  Affect:  Appropriate  Cognitive:  Attentive Appropriate  Insight:  Engaged  Engagement in Therapy:  Engaged  Modes of Intervention:  Discussion Exploration Problem-Solving Supportive  Summary of Progress/Problems:  The topic for today was feelings around relapse.  Patient discussed what relapse prevention is to them and identified triggers that are on the patient to relapse.  Patient processed his/her feelings toward relapse and was able to related to peers. Patient identified coping skills that can be used to prevent a relapse.  Patient shared he understand due to his illness his life will never be the same and he needs to not give up on life at this point.     Wynn Banker 11/07/2012 2:47 PM
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
## 2012-11-07 NOTE — [facility] Group Notes (Signed)
[facility] LCSW Aftercare Discharge Planning Group Note   11/07/2012 10:33 AM  Participation Quality:  Appropriate  Mood/Affect:  Appropriate and Depressed  Depression Rating:  5  Anxiety Rating:  5  Thoughts of Suicide:  Yes   Will you contract for safety?   Yes  Current AVH:  No  Plan for Discharge/Comments:  Patient reports admitting to hospital with SI due to multiple medical problems.  He reports having home, transportation and access to medications.  He will need assistance with outpatient follow up.  Transportation Means: Patient uses public transportation.  Supports:  Patient has a good support system.   Phyllistine Domingos, Joesph July

## 2012-11-07 NOTE — Progress Notes (Signed)
Adult Psychoeducational Group Note  Date:  11/07/2012 Time:  12:38 PM  Group Topic/Focus:  Goals Group:   The focus of this group is to help patients establish daily goals to achieve during treatment and discuss how the patient can incorporate goal setting into their daily lives to aide in recovery.  Participation Level:  Did Not Attend  Participation Quality:  Did not Attend  Affect:  Did not Attend  Cognitive:  Did not Attend  Insight: None  Engagement in Group:  Did not Attend  Modes of Intervention:  Did not Attend  Additional Comments:  This Clinical research associate asked the patient if he wanted to attend group and he stated that "he wore himself out with the earlier group".  Cathlean Cower 11/07/2012, 12:38 PM

## 2012-11-07 NOTE — Progress Notes (Signed)
[facility] MD Progress Note  11/07/2012 12:00 PM Lucas Small  MRN:  [id number] Subjective:  Patient complaining word reading nutrition he feeds lately he was hitting the wall and done so for his emotional problem. Patient feels unsafe and so concerned about his medication for the medical problems. Patient reported he has a pain from his abdomen hernia close to ileostomy bag. Patient was a helped to clean his bag. Complaining about insomnia and going to the bed and stating sleep is not good. He has requested to start his home medication which were not started due to confusion at admission and technical difficulties.  Diagnosis:  Axis I: Generalized Anxiety Disorder and Major Depression, Recurrent severe  ADL's:  Impaired  Sleep: Poor  Appetite:  Poor  Suicidal Ideation:  Plan:  yes Intent:  yes Means:  yes Homicidal Ideation:  Denied AEB (as evidenced by):  Psychiatric Specialty Exam: ROS  Blood pressure 108/73, pulse 98, temperature 97.8 F (36.6 C), temperature source Oral, resp. rate 18, height 5' 7.5" (1.715 m), weight 68.947 kg (152 lb), SpO2 98.00%.Body mass index is 23.44 kg/(m^2).  General Appearance: Fairly Groomed and Neat  Patent attorney::  Fair  Speech:  Clear and Coherent and Slow  Volume:  Decreased  Mood:  Anxious, Depressed, Dysphoric, Hopeless and Worthless  Affect:  Flat  Thought Process:  Goal Directed and Linear  Orientation:  Full (Time, Place, and Person)  Thought Content:  Rumination  Suicidal Thoughts:  Yes.  without intent/plan  Homicidal Thoughts:  No  Memory:  Immediate;   Fair  Judgement:  Impaired  Insight:  Lacking  Psychomotor Activity:  Decreased and Psychomotor Retardation  Concentration:  Poor  Recall:  Poor  Akathisia:  NA  Handed:  Right  AIMS (if indicated):     Assets:  Communication Skills Desire for Improvement  Sleep:  Number of Hours: 1.5   Current Medications: Current Facility-Administered Medications  Medication Dose Route  Frequency Provider Last Rate Last Dose  . acetaminophen (TYLENOL) tablet 650 mg  650 mg Oral Q6H PRN Nehemiah Settle, MD      . ciprofloxacin (CIPRO) tablet 750 mg  750 mg Oral BID Shuvon Rankin, NP   750 mg at 11/07/12 0755  . diltiazem (CARDIZEM CD) 24 hr capsule 180 mg  180 mg Oral Daily Nehemiah Settle, MD      . fentaNYL (DURAGESIC - dosed mcg/hr) patch 25 mcg  25 mcg Transdermal Q72H Nehemiah Settle, MD      . metoprolol succinate (TOPROL-XL) 24 hr tablet 100 mg  100 mg Oral Daily Nehemiah Settle, MD      . nicotine (NICODERM CQ - dosed in mg/24 hours) patch 14 mg  14 mg Transdermal Once Shuvon Rankin, NP   14 mg at 11/06/12 1706  . polyethylene glycol (MIRALAX / GLYCOLAX) packet 17 g  17 g Oral BID Shuvon Rankin, NP       Facility-Administered Medications Ordered in Other Encounters  Medication Dose Route Frequency Provider Last Rate Last Dose  . alum & mag hydroxide-simeth (MAALOX/MYLANTA) 200-200-20 MG/5ML suspension 30 mL  30 mL Oral Q4H PRN Shuvon Rankin, NP      . aspirin tablet 325 mg  325 mg Oral Daily Shuvon Rankin, NP      . citalopram (CELEXA) tablet 20 mg  20 mg Oral Daily Shuvon Rankin, NP      . diltiazem (DILACOR XR) 24 hr capsule 180 mg  180 mg Oral Daily Shuvon Rankin, NP      . [  START ON 11/08/2012] fentaNYL (DURAGESIC - dosed mcg/hr) patch 25 mcg  25 mcg Transdermal Q72H Shuvon Rankin, NP      . folic acid (FOLVITE) tablet 1 mg  1 mg Oral Daily Shuvon Rankin, NP      . haloperidol (HALDOL) tablet 5 mg  5 mg Oral Q6H PRN Shuvon Rankin, NP      . lamoTRIgine (LAMICTAL) tablet 100 mg  100 mg Oral Daily Shuvon Rankin, NP      . LORazepam (ATIVAN) tablet 1 mg  1 mg Oral Q8H PRN Shuvon Rankin, NP      . magnesium hydroxide (MILK OF MAGNESIA) suspension 30 mL  30 mL Oral Daily PRN Shuvon Rankin, NP      . metoprolol succinate (TOPROL-XL) 24 hr tablet 100 mg  100 mg Oral Daily Shuvon Rankin, NP      . multivitamin with minerals tablet 1  tablet  1 tablet Oral Daily Shuvon Rankin, NP      . nicotine (NICODERM CQ - dosed in mg/24 hours) patch 14 mg  14 mg Transdermal Daily Shuvon Rankin, NP      . traZODone (DESYREL) tablet 100 mg  100 mg Oral QHS PRN Shuvon Rankin, NP        Lab Results: No results found for this or any previous visit (from the past 48 hour(s)).  Physical Findings: AIMS: Facial and Oral Movements Muscles of Facial Expression: None, normal Lips and Perioral Area: None, normal Jaw: None, normal Tongue: None, normal,Extremity Movements Upper (arms, wrists, hands, fingers): None, normal Lower (legs, knees, ankles, toes): None, normal, Trunk Movements Neck, shoulders, hips: None, normal, Overall Severity Severity of abnormal movements (highest score from questions above): None, normal Incapacitation due to abnormal movements: None, normal Patient's awareness of abnormal movements (rate only patient's report): No Awareness, Dental Status Current problems with teeth and/or dentures?: No Does patient usually wear dentures?: No  CIWA:  CIWA-Ar Total: 1 COWS:  COWS Total Score: 2  Treatment Plan Summary: Daily contact with patient to assess and evaluate symptoms and progress in treatment Medication management  Plan: 1. Restart his medication from home for medical conditions 2. Monitor for adverse effects 3. Encourage active participation  4. Disposition plans in progress 5. Contact his out patient provider reg appropriate history.   Medical Decision Making Problem Points:  Established problem, worsening (2) and Review of psycho-social stressors (1) Data Points:  Review or order clinical lab tests (1) Review of medication regiment & side effects (2) Review of new medications or change in dosage (2)  I certify that inpatient services furnished can reasonably be expected to improve the patient's condition.   Florenda Watt,JANARDHAHA R. 11/07/2012, 12:00 PM
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
## 2012-11-07 NOTE — Progress Notes (Signed)
Adult Psychoeducational Group Note  Date:  11/07/2012 Time:  1:25 PM  Group Topic/Focus:  Relapse Prevention Planning:   The focus of this group is to define relapse and discuss the need for planning to combat relapse.  Participation Level:  Active  Participation Quality:  Appropriate and Attentive  Affect:  Appropriate  Cognitive:  Alert and Appropriate  Insight: Good  Engagement in Group:  Engaged  Modes of Intervention:  Discussion, Socialization and Support  Additional Comments:  Lucas Small was engaged in the therapeutic games played today. The values and goals discussed related to relapse prevention.    Cathlean Cower 11/07/2012, 1:25 PM

## 2012-11-08 ENCOUNTER — Encounter (HOSPITAL_COMMUNITY): Payer: Self-pay | Admitting: Emergency Medicine

## 2012-11-08 ENCOUNTER — Inpatient Hospital Stay (HOSPITAL_COMMUNITY): Payer: Federal, State, Local not specified - Other

## 2012-11-08 LAB — BASIC METABOLIC PANEL
CO2: 28 mEq/L (ref 19–32)
Glucose, Bld: 102 mg/dL — ABNORMAL HIGH (ref 70–99)
Potassium: 4.5 mEq/L (ref 3.5–5.1)
Sodium: 138 mEq/L (ref 135–145)

## 2012-11-08 LAB — CBC WITH DIFFERENTIAL/PLATELET
Basophils Absolute: 0 10*3/uL (ref 0.0–0.1)
Basophils Relative: 0 % (ref 0–1)
Eosinophils Relative: 4 % (ref 0–5)
HCT: 40.5 % (ref 39.0–52.0)
MCHC: 33.8 g/dL (ref 30.0–36.0)
Monocytes Absolute: 0.8 10*3/uL (ref 0.1–1.0)
Neutro Abs: 5.9 10*3/uL (ref 1.7–7.7)
Platelets: 213 10*3/uL (ref 150–400)
RDW: 17.9 % — ABNORMAL HIGH (ref 11.5–15.5)

## 2012-11-08 LAB — HEPATIC FUNCTION PANEL
ALT: 16 U/L (ref 0–53)
AST: 16 U/L (ref 0–37)
Albumin: 3.2 g/dL — ABNORMAL LOW (ref 3.5–5.2)
Alkaline Phosphatase: 101 U/L (ref 39–117)
Total Bilirubin: 0.2 mg/dL — ABNORMAL LOW (ref 0.3–1.2)

## 2012-11-08 MED ORDER — NICOTINE 21 MG/24HR TD PT24
21.0000 mg | MEDICATED_PATCH | Freq: Every day | TRANSDERMAL | Status: DC
  Administered 2012-11-08 – 2012-11-10 (×3): 21 mg via TRANSDERMAL
  Filled 2012-11-08 (×6): qty 1

## 2012-11-08 MED ORDER — NICOTINE 21 MG/24HR TD PT24
MEDICATED_PATCH | TRANSDERMAL | Status: AC
  Filled 2012-11-08: qty 1

## 2012-11-08 MED ORDER — OXYCODONE-ACETAMINOPHEN 5-325 MG PO TABS
2.0000 | ORAL_TABLET | Freq: Once | ORAL | Status: AC
  Administered 2012-11-08: 2 via ORAL
  Filled 2012-11-08: qty 2

## 2012-11-08 MED ORDER — TRAZODONE HCL 100 MG PO TABS
100.0000 mg | ORAL_TABLET | Freq: Every evening | ORAL | Status: DC | PRN
  Administered 2012-11-08: 100 mg via ORAL

## 2012-11-08 NOTE — ED Notes (Signed)
[id number]> Expected date:<BR> Expected time:<BR> Means of arrival:<BR> Comments:<BR> ems- 47 yo, seizure like activity

## 2012-11-08 NOTE — ED Notes (Signed)
Pt from [facility] after his R sided hernia enlarged at ileostomy site. Hernia appears to be approximate baseball size to the R of pt stoma. Physician concerned about "green, bile" in ileostomy. Pt just had colostomy March 17th,2014. Stoma is large, red in color. Pt denies V/D/fever, but does have some nausea. Pt is A&O and in NAD.

## 2012-11-08 NOTE — Progress Notes (Signed)
.  Psychoeducational Group Note    Date: 11/08/2012 Time:  0915    Goal Setting Purpose of Group: To be able to set Small goal that is measurable and that can be accomplished in one day Participation Level:  Active  Participation Quality:  Appropriate  Affect:  Appropriate  Cognitive:  Oriented  Insight:  Improving  Engagement in Group:  Engaged  Additional Comments:    Lucas Small 

## 2012-11-08 NOTE — ED Notes (Signed)
Pt transported to [facility]  

## 2012-11-08 NOTE — Progress Notes (Signed)
Psychoeducational Group Note  Date: 11/08/2012 Time:  1015  Group Topic/Focus:  Identifying Needs:   The focus of this group is to help patients identify their personal needs that have been historically problematic and identify healthy behaviors to address their needs.  Participation Level:  Active  Participation Quality:  Appropriate  Affect:  Appropriate  Cognitive:  Appropriate  Insight:  Improving  Engagement in Group:  Improving  Additional Comments:  Pt participated in group.   Pamela Intrieri A  

## 2012-11-08 NOTE — Progress Notes (Signed)
D: Patient resting in bed with eyes closed. Respirations even and unlabored. No apparent distress noted.  A: Monitoring pt Q15 min for safety  R: Patient remains safe on the unit. Will continue to monitor.  

## 2012-11-08 NOTE — Progress Notes (Signed)
[facility] MD Progress Note  11/08/2012 4:37 PM SHAMMOND ARAVE  MRN:  [id number] Subjective: [name] is a 47 y/o male patient who was brought in for worsening of depression with suicidal ideation. The patient reports that the patient has been feeling more depressed over the past week.  The patient called 911 and was brought in by police for assistance.  The patient reports that he was in a medically induced coma September 7 after getting septic after surgery for a couple months.  The patient states that his moods have changed since the coma.   Diagnosis: Generalized Anxiety Disorder and Major Depression, Recurrent severe   ADL's:  Intact  Sleep: Poor  Appetite:  Good  Suicidal Ideation:  Plan:  Patient denies Intent:  Patient denies Means:  Patient denies. Homicidal Ideation:  Plan:  Patient denies Intent:  Patient denies Means:  Patient denies. AEB (as evidenced by): Behavior   Psychiatric Specialty Exam: Review of Systems  Constitutional: Negative for fever, chills and weight loss.  Cardiovascular: Negative for chest pain, palpitations and leg swelling.  Gastrointestinal: Positive for nausea and abdominal pain. Negative for heartburn, vomiting, diarrhea and constipation.  Genitourinary: Negative for flank pain.    Blood pressure 100/69, pulse 118, temperature 98.8 F (37.1 C), temperature source Oral, resp. rate 16, height 5' 7.5" (1.715 m), weight 68.947 kg (152 lb), SpO2 98.00%.Body mass index is 23.44 kg/(m^2).  General Appearance: Casual  Eye Contact::  Good  Speech:  Clear and Coherent and Normal Rate  Volume:  Increased  Mood:  Depressed  Affect:  Appropriate  Thought Process:  Circumstantial, Linear and Logical  Orientation:  Full (Time, Place, and Person)  Thought Content:  WDL  Suicidal Thoughts:  No  Homicidal Thoughts:  No  Memory:  Immediate;   Good Recent;   Fair Remote;   Fair  Judgement:  Poor  Insight:  Good  Psychomotor Activity:  Normal   Concentration:  Good  Recall:  Good  Akathisia:  Negative  Handed:  Right  AIMS (if indicated):  Not indicated  Assets:  Communication Skills  Sleep:  Number of Hours: 3.25   Current Medications: Current Facility-Administered Medications  Medication Dose Route Frequency Provider Last Rate Last Dose  . acetaminophen (TYLENOL) tablet 650 mg  650 mg Oral Q6H PRN Nehemiah Settle, MD   650 mg at 11/07/12 1458  . ciprofloxacin (CIPRO) tablet 750 mg  750 mg Oral BID Shuvon Rankin, NP   750 mg at 11/08/12 0803  . diltiazem (CARDIZEM CD) 24 hr capsule 180 mg  180 mg Oral Daily Nehemiah Settle, MD   180 mg at 11/08/12 0804  . fentaNYL (DURAGESIC - dosed mcg/hr) patch 25 mcg  25 mcg Transdermal Q72H Fransisca Kaufmann, NP      . metoprolol succinate (TOPROL-XL) 24 hr tablet 100 mg  100 mg Oral Daily Nehemiah Settle, MD   100 mg at 11/08/12 0914  . nicotine (NICODERM CQ - dosed in mg/24 hours) 21 mg/24hr patch           . nicotine (NICODERM CQ - dosed in mg/24 hours) patch 21 mg  21 mg Transdermal Q0600 Nehemiah Settle, MD   21 mg at 11/08/12 0959  . polyethylene glycol (MIRALAX / GLYCOLAX) packet 17 g  17 g Oral BID Shuvon Rankin, NP      . traMADol (ULTRAM) tablet 50 mg  50 mg Oral Q6H PRN Fransisca Kaufmann, NP   50 mg at 11/08/12 1403  .  traZODone (DESYREL) tablet 50 mg  50 mg Oral QHS PRN,MR X 1 Fransisca Kaufmann, NP   50 mg at 11/08/12 0033   Facility-Administered Medications Ordered in Other Encounters  Medication Dose Route Frequency Provider Last Rate Last Dose  . alum & mag hydroxide-simeth (MAALOX/MYLANTA) 200-200-20 MG/5ML suspension 30 mL  30 mL Oral Q4H PRN Shuvon Rankin, NP      . aspirin tablet 325 mg  325 mg Oral Daily Shuvon Rankin, NP      . citalopram (CELEXA) tablet 20 mg  20 mg Oral Daily Shuvon Rankin, NP      . folic acid (FOLVITE) tablet 1 mg  1 mg Oral Daily Shuvon Rankin, NP      . haloperidol (HALDOL) tablet 5 mg  5 mg Oral Q6H PRN Shuvon Rankin, NP       . lamoTRIgine (LAMICTAL) tablet 100 mg  100 mg Oral Daily Shuvon Rankin, NP      . LORazepam (ATIVAN) tablet 1 mg  1 mg Oral Q8H PRN Shuvon Rankin, NP      . magnesium hydroxide (MILK OF MAGNESIA) suspension 30 mL  30 mL Oral Daily PRN Shuvon Rankin, NP      . metoprolol succinate (TOPROL-XL) 24 hr tablet 100 mg  100 mg Oral Daily Shuvon Rankin, NP      . multivitamin with minerals tablet 1 tablet  1 tablet Oral Daily Shuvon Rankin, NP        Lab Results: No results found for this or any previous visit (from the past 48 hour(s)).  Physical Findings: AIMS: Facial and Oral Movements Muscles of Facial Expression: None, normal Lips and Perioral Area: None, normal Jaw: None, normal Tongue: None, normal,Extremity Movements Upper (arms, wrists, hands, fingers): None, normal Lower (legs, knees, ankles, toes): None, normal, Trunk Movements Neck, shoulders, hips: None, normal, Overall Severity Severity of abnormal movements (highest score from questions above): None, normal Incapacitation due to abnormal movements: None, normal Patient's awareness of abnormal movements (rate only patient's report): No Awareness, Dental Status Current problems with teeth and/or dentures?: No Does patient usually wear dentures?: No  CIWA:  CIWA-Ar Total: 1 COWS:  COWS Total Score: 2  Treatment Plan Summary: Daily contact with patient to assess and evaluate symptoms and progress in treatment Medication management  Plan: 1. Medications as follows A)Increase Trazodone 100 mg daily. B) Celexa 20 mg daily C) Lamictal 100 mg D)Continue Haldol 2. Monitor for adverse effects  3. Encourage active participation  4. Disposition plans in progress  5. Contact his out patient provider reg appropriate history.  6. Will send patient to ED to examine ileostomy tube.  Medical Decision Making Problem Points:  Established problem, worsening (2), New problem, with additional work-up planned (4), Review of last therapy  session (1) and Review of psycho-social stressors (1) Data Points:  Decision to obtain old records (1) Review or order clinical lab tests (1) Review of medication regiment & side effects (2)  I certify that inpatient services furnished can reasonably be expected to improve the patient's condition.   Jaxn Chiquito 11/08/2012, 4:37 PM

## 2012-11-08 NOTE — [facility] Group Notes (Signed)
[facility] LCSW Group Therapy  Stages of Change 11/08/2012  1:40 PM    Type of Therapy:  Group Therapy  Participation Level:  Minimal  Participation Quality:  Appropriate  Affect:  Appropriate  Cognitive:  Appropriate  Insight:  Developing/Improving  Engagement in Therapy:  Developing Modes of Intervention:  Discussion, Education, Exploration, Problem-Solving, Rapport Building, Support  Summary of Progress/Problems: The topic for today was the stages of changes.  Patient were asked to identify changes they need to make and their motivation and commitment to making changes in their lives.  Patient advised of not feeling well and not wanting to participate in discussion but remained in group.    Wynn Banker 11/08/2012  1:40 PM

## 2012-11-08 NOTE — Progress Notes (Signed)
D) Pt being taken to the ED for evaluation of his abdominal pain and discomfort. States that his stool is an "[place]". Told the doctor that he was in pain. Pt rates his depression and his hopelessness both at a 5. Presdntly denies SI, although states his depression is increasing over the last couple of days being in the hospital. A) Given support and reassurance and praise. Encouraged to speak with the doctor about his pain. Report called to the ED R) Going to the ED for evaluation of his abdominal discomfort.

## 2012-11-09 MED ORDER — LAMOTRIGINE 25 MG PO TABS: 25.0000 mg | ORAL_TABLET | Freq: Every day | ORAL | Status: DC

## 2012-11-09 MED ORDER — LAMOTRIGINE 100 MG PO TABS
100.0000 mg | ORAL_TABLET | Freq: Every day | ORAL | Status: DC
  Administered 2012-11-09 – 2012-11-10 (×2): 100 mg via ORAL
  Filled 2012-11-09: qty 1
  Filled 2012-11-09: qty 2
  Filled 2012-11-09 (×4): qty 1

## 2012-11-09 MED ORDER — CITALOPRAM HYDROBROMIDE 20 MG PO TABS
20.0000 mg | ORAL_TABLET | Freq: Every day | ORAL | Status: DC
  Administered 2012-11-09 – 2012-11-10 (×2): 20 mg via ORAL
  Filled 2012-11-09: qty 2
  Filled 2012-11-09 (×5): qty 1

## 2012-11-09 MED ORDER — ASPIRIN 325 MG PO TABS
325.0000 mg | ORAL_TABLET | Freq: Every day | ORAL | Status: DC
  Administered 2012-11-09 – 2012-11-10 (×2): 325 mg via ORAL
  Filled 2012-11-09 (×4): qty 1

## 2012-11-09 MED ORDER — LAMOTRIGINE 25 MG PO TABS
25.0000 mg | ORAL_TABLET | Freq: Every day | ORAL | Status: DC
  Administered 2012-11-09 – 2012-11-10 (×2): 25 mg via ORAL
  Filled 2012-11-09 (×5): qty 1

## 2012-11-09 MED ORDER — TRAZODONE HCL 50 MG PO TABS
150.0000 mg | ORAL_TABLET | Freq: Every evening | ORAL | Status: DC | PRN
  Filled 2012-11-09: qty 1

## 2012-11-09 NOTE — Progress Notes (Signed)
Pt returned from the ED at around 2230. Provided meal. Asking that percocet given in the ED be continued here. "I have more pain now and even though I'm okay at the moment, I would like to have the percocet if I wake up tonight." Also asking that his trazadone be increased to 200mg . Spoke with MD on call regarding pt's requests. New order received to increase trazadone to 100mg  qhs prn. Explained to pt an increase larger than that would be too much of a jump and that his response will be evaluated and adjusted tomorrow if needed. Also explained to pt that given his present fentanyl patch, percocet will not be added to his med orders but that he does still have tramadol prn. Pt verbalized understanding and will speak with MD tomorrow. Affect is anxious with congruent mood. Denies SI/HI/AVh and remains safe. Lawrence Marseilles

## 2012-11-09 NOTE — Progress Notes (Signed)
Spoke with pt 1:1 who is anxious, depressed and irritable. Was in room, lying in bed awake. With much encouragement pt came up for 2000 med and did attend group. Reports pain is a 7/10 but does not wish to take ultram prn at this time. Supported, encouraged. Med education provided. Denies SI/HI/AVH and remains safe. Lucas Small

## 2012-11-09 NOTE — Progress Notes (Signed)
Psychoeducational Group Note  Psychoeducational Group Note  Date: 11/09/2012 Time:  11/09/2012  Group Topic/Focus:  Gratefulness:  The focus of this group is to help patients identify what two things they are most grateful for in their lives. What helps ground them and to center them on their work to their recovery.  Participation Level:  Active  Participation Quality:  Attentive  Affect:  Appropriate  Cognitive:  Oriented  Insight:  Lacking  Engagement in Group:  Engaged  Additional Comments:    Dione Housekeeper

## 2012-11-09 NOTE — ED Provider Notes (Signed)
History     CSN: [id number]  Arrival date & time 11/08/12  1746   First MD Initiated Contact with Patient 11/08/12 1807      Chief Complaint  Patient presents with  . Hernia    (Consider location/radiation/quality/duration/timing/severity/associated sxs/prior treatment) HPI Comments: Pt with hx of colectomy, with ostomy bag comes in with cc of green bile per ostomy bag, with swelling of the abdomen. Reports noticing the swelling few days back - had a CT done by the ED last visit - he has a small hernia and fluid collection - and patient was discharged. He reports that over the past 3 days, he has had greenish stools. No increase or decrease in output, with mild abd pain. He has no n/v/f/c.  The history is provided by the patient and medical records.    Past Medical History  Diagnosis Date  . Depression   . Alcoholic   . Hypertension   . Renal disorder   . Respiratory failure   . CHF (congestive heart failure)   . Perforation bowel   . PNA (pneumonia)   . MRSA (methicillin resistant Staphylococcus aureus)   . Renal failure     Past Surgical History  Procedure Laterality Date  . Abdominal surgery    . Cardiac surgery    . Peripherally inserted central catheter insertion    . Ileostomy      History reviewed. No pertinent family history.  History  Substance Use Topics  . Smoking status: Current Every Day Smoker -- 1.50 packs/day for 25 years    Types: Cigarettes  . Smokeless tobacco: Not on file  . Alcohol Use: Yes     Comment: 1/5 daily liquor      Review of Systems  Constitutional: Negative for fever, chills, activity change and appetite change.  Respiratory: Negative for cough and shortness of breath.   Cardiovascular: Negative for chest pain.  Gastrointestinal: Positive for abdominal distention. Negative for nausea, vomiting, abdominal pain and diarrhea.  Genitourinary: Negative for dysuria.  Hematological: Does not bruise/bleed easily.    Allergies   Bee venom  Home Medications  No current outpatient prescriptions on file.  BP 104/69  Pulse 78  Temp(Src) 98.2 F (36.8 C) (Oral)  Resp 16  Ht 5' 7.5" (1.715 m)  Wt 152 lb (68.947 kg)  BMI 23.44 kg/m2  SpO2 98%  Physical Exam  Nursing note and vitals reviewed. Constitutional: He is oriented to person, place, and time. He appears well-developed.  HENT:  Head: Normocephalic and atraumatic.  Eyes: Conjunctivae and EOM are normal. Pupils are equal, round, and reactive to light.  Neck: Normal range of motion. Neck supple.  Cardiovascular: Normal rate and regular rhythm.   Pulmonary/Chest: Effort normal and breath sounds normal.  Abdominal: Soft. Bowel sounds are normal. He exhibits distension. There is tenderness. There is no rebound and no guarding.  Pt has swelling around the ostomy site. The hernia is reducible, there is also fluid collection appreciated on the exam. Tenderness is mild, no peritoneal signs. No erythema, crepitus on exam.  Neurological: He is alert and oriented to person, place, and time.  Skin: Skin is warm.    ED Course  Procedures (including critical care time)  Labs Reviewed  CBC WITH DIFFERENTIAL - Abnormal; Notable for the following:    RBC 4.21 (*)    RDW 17.9 (*)    All other components within normal limits  BASIC METABOLIC PANEL - Abnormal; Notable for the following:    Glucose, Bld  102 (*)    GFR calc non Af Amer 74 (*)    GFR calc Af Amer 85 (*)    All other components within normal limits  HEPATIC FUNCTION PANEL - Abnormal; Notable for the following:    Albumin 3.2 (*)    Total Bilirubin 0.2 (*)    All other components within normal limits   Dg Abd Acute W/chest  11/08/2012   *RADIOLOGY REPORT*  Clinical Data: Abdominal pain and nausea.  Smoker.  ACUTE ABDOMEN SERIES (ABDOMEN 2 VIEW & CHEST 1 VIEW)  Comparison: Chest radiographs and chest, abdomen pelvis CT dated 11/05/2012.  Findings: Normal sized heart.  Clear lungs.  Minimal scoliosis.  Mild biapical pleural and parenchymal scarring.  Normal bowel gas pattern without free peritoneal air.  Right lower quadrant ostomy. Pelvic surgical staples.  IMPRESSION: No acute abnormality.   Original Report Authenticated By: Beckie Salts, M.D.     1. Alcohol dependency   2. Substance induced mood disorder   3. MDD (major depressive disorder), recurrent severe, without psychosis   4. Hernia       MDM  Pt comes in with cc of abd pain, hernia and green stools,  Has hx of several GI surgery, s/p colectomy with ostomy bag - within the [facility] system.  Pt had recent evaluation in the ED, with a CT - and the lesion that he is concerned about shows a hernia, with a fluid collection, no signs of infection.  Exam today is benign. There is green stool in the bag. The output has stayed the same. He has mild abd pain - the hernia is reducible, there is no ecchymoses, vitals are stable.  Plan is to get AAS, basic labs. If all WNL - he is to f./u with his Surgery team - we will give [facility] # as well.  Derwood Kaplan, MD 11/09/12 [id number]

## 2012-11-09 NOTE — Progress Notes (Signed)
Psychoeducational Group Note  Date:  11/09/2012 Time:  1015  Group Topic/Focus:  Making Healthy Choices:   The focus of this group is to help patients identify negative/unhealthy choices they were using prior to admission and identify positive/healthier coping strategies to replace them upon discharge.  Participation Level:  Active  Participation Quality:  Appropriate  Affect:  Flat  Cognitive:  Appropriate  Insight:  Lacking  Engagement in Group:  Engaged  Additional Comments:    Dione Housekeeper 11/09/2012

## 2012-11-09 NOTE — Progress Notes (Signed)
[facility] MD Progress Note  11/09/2012 2:16 PM [name]  MRN:  [id number] Subjective: Lucas Small is a 47 y/o male patient who was brought in for worsening of depression with suicidal ideation.  The patient reports that his depression has not improved.  He reports the color of the bowel in his ileostomy tube. The patient reports that he is still in pain and due to that he is not able to sleep and his mood is worsening.  The patient reports that he would like to increase his fentanyl.  He reports he does not have a PCP.    Diagnosis: Generalized Anxiety Disorder and Major Depression, Recurrent severe   ADL's:  Intact  Sleep: Poor-  Appetite:  Good  Suicidal Ideation:  Plan:  Patient denies Intent:  Patient denies Means:  Patient denies.  Homicidal Ideation:  Plan:  Patient denies Intent:  Patient denies Means:  Patient denies.  AEB (as evidenced by): Behavior   Psychiatric Specialty Exam: Review of Systems  Constitutional: Negative for fever, chills and weight loss.  Cardiovascular: Negative for chest pain, palpitations and leg swelling.  Gastrointestinal: Positive for nausea and abdominal pain. Negative for heartburn, vomiting, diarrhea and constipation.  Genitourinary: Negative for flank pain.    Blood pressure 104/69, pulse 78, temperature 98.2 F (36.8 C), temperature source Oral, resp. rate 16, height 5' 7.5" (1.715 m), weight 68.947 kg (152 lb), SpO2 98.00%.Body mass index is 23.44 kg/(m^2).  General Appearance: Casual  Eye Contact::  Good  Speech:  Clear and Coherent and Normal Rate  Volume:  Increased  Mood:  Depressed  Affect:  Appropriate  Thought Process:  Circumstantial, Linear and Logical  Orientation:  Full (Time, Place, and Person)  Thought Content:  WDL  Suicidal Thoughts:  No  Homicidal Thoughts:  No  Memory:  Immediate;   Good Recent;   Fair Remote;   Fair  Judgement:  Poor  Insight:  Good  Psychomotor Activity:  Normal  Concentration:  Good   Recall:  Good  Akathisia:  Negative  Handed:  Right  AIMS (if indicated):  Not indicated  Assets:  Communication Skills  Sleep:  Number of Hours: 5   Current Medications: Current Facility-Administered Medications  Medication Dose Route Frequency Provider Last Rate Last Dose  . acetaminophen (TYLENOL) tablet 650 mg  650 mg Oral Q6H PRN Nehemiah Settle, MD   650 mg at 11/07/12 1458  . ciprofloxacin (CIPRO) tablet 750 mg  750 mg Oral BID Shuvon Rankin, NP   750 mg at 11/09/12 0826  . diltiazem (CARDIZEM CD) 24 hr capsule 180 mg  180 mg Oral Daily Nehemiah Settle, MD   180 mg at 11/09/12 0826  . fentaNYL (DURAGESIC - dosed mcg/hr) patch 25 mcg  25 mcg Transdermal Q72H Fransisca Kaufmann, NP      . metoprolol succinate (TOPROL-XL) 24 hr tablet 100 mg  100 mg Oral Daily Nehemiah Settle, MD   100 mg at 11/09/12 0826  . nicotine (NICODERM CQ - dosed in mg/24 hours) patch 21 mg  21 mg Transdermal Q0600 Derwood Kaplan, MD   21 mg at 11/09/12 0825  . polyethylene glycol (MIRALAX / GLYCOLAX) packet 17 g  17 g Oral BID Shuvon Rankin, NP      . traMADol (ULTRAM) tablet 50 mg  50 mg Oral Q6H PRN Fransisca Kaufmann, NP   50 mg at 11/09/12 1213  . traZODone (DESYREL) tablet 100 mg  100 mg Oral QHS PRN Larena Sox, MD  100 mg at 11/08/12 2318   Facility-Administered Medications Ordered in Other Encounters  Medication Dose Route Frequency Provider Last Rate Last Dose  . alum & mag hydroxide-simeth (MAALOX/MYLANTA) 200-200-20 MG/5ML suspension 30 mL  30 mL Oral Q4H PRN Shuvon Rankin, NP      . aspirin tablet 325 mg  325 mg Oral Daily Shuvon Rankin, NP      . citalopram (CELEXA) tablet 20 mg  20 mg Oral Daily Shuvon Rankin, NP      . folic acid (FOLVITE) tablet 1 mg  1 mg Oral Daily Shuvon Rankin, NP      . haloperidol (HALDOL) tablet 5 mg  5 mg Oral Q6H PRN Shuvon Rankin, NP      . lamoTRIgine (LAMICTAL) tablet 100 mg  100 mg Oral Daily Shuvon Rankin, NP      . LORazepam (ATIVAN)  tablet 1 mg  1 mg Oral Q8H PRN Shuvon Rankin, NP      . magnesium hydroxide (MILK OF MAGNESIA) suspension 30 mL  30 mL Oral Daily PRN Shuvon Rankin, NP      . metoprolol succinate (TOPROL-XL) 24 hr tablet 100 mg  100 mg Oral Daily Shuvon Rankin, NP      . multivitamin with minerals tablet 1 tablet  1 tablet Oral Daily Shuvon Rankin, NP        Lab Results:  Results for orders placed during the hospital encounter of 11/06/12 (from the past 48 hour(s))  CBC WITH DIFFERENTIAL     Status: Abnormal   Collection Time    11/08/12  7:38 PM      Result Value Range   WBC 8.6  4.0 - 10.5 K/uL   RBC 4.21 (*) 4.22 - 5.81 MIL/uL   Hemoglobin 13.7  13.0 - 17.0 g/dL   HCT 16.1  09.6 - 04.5 %   MCV 96.2  78.0 - 100.0 fL   MCH 32.5  26.0 - 34.0 pg   MCHC 33.8  30.0 - 36.0 g/dL   RDW 40.9 (*) 81.1 - 91.4 %   Platelets 213  150 - 400 K/uL   Neutrophils Relative % 68  43 - 77 %   Neutro Abs 5.9  1.7 - 7.7 K/uL   Lymphocytes Relative 19  12 - 46 %   Lymphs Abs 1.6  0.7 - 4.0 K/uL   Monocytes Relative 9  3 - 12 %   Monocytes Absolute 0.8  0.1 - 1.0 K/uL   Eosinophils Relative 4  0 - 5 %   Eosinophils Absolute 0.3  0.0 - 0.7 K/uL   Basophils Relative 0  0 - 1 %   Basophils Absolute 0.0  0.0 - 0.1 K/uL  BASIC METABOLIC PANEL     Status: Abnormal   Collection Time    11/08/12  7:38 PM      Result Value Range   Sodium 138  135 - 145 mEq/L   Potassium 4.5  3.5 - 5.1 mEq/L   Chloride 103  96 - 112 mEq/L   CO2 28  19 - 32 mEq/L   Glucose, Bld 102 (*) 70 - 99 mg/dL   BUN 16  6 - 23 mg/dL   Creatinine, Ser 7.82  0.50 - 1.35 mg/dL   Calcium 9.3  8.4 - 95.6 mg/dL   GFR calc non Af Amer 74 (*) >90 mL/min   GFR calc Af Amer 85 (*) >90 mL/min   Comment:  The eGFR has been calculated     using the CKD EPI equation.     This calculation has not been     validated in all clinical     situations.     eGFR's persistently     <90 mL/min signify     possible Chronic Kidney Disease.  HEPATIC  FUNCTION PANEL     Status: Abnormal   Collection Time    11/08/12  7:38 PM      Result Value Range   Total Protein 6.3  6.0 - 8.3 g/dL   Albumin 3.2 (*) 3.5 - 5.2 g/dL   AST 16  0 - 37 U/L   ALT 16  0 - 53 U/L   Alkaline Phosphatase 101  39 - 117 U/L   Total Bilirubin 0.2 (*) 0.3 - 1.2 mg/dL   Bilirubin, Direct <4.0  0.0 - 0.3 mg/dL   Comment: REPEATED TO VERIFY   Indirect Bilirubin NOT CALCULATED  0.3 - 0.9 mg/dL    Physical Findings: AIMS: Facial and Oral Movements Muscles of Facial Expression: None, normal Lips and Perioral Area: None, normal Jaw: None, normal Tongue: None, normal,Extremity Movements Upper (arms, wrists, hands, fingers): None, normal Lower (legs, knees, ankles, toes): None, normal, Trunk Movements Neck, shoulders, hips: None, normal, Overall Severity Severity of abnormal movements (highest score from questions above): None, normal Incapacitation due to abnormal movements: None, normal Patient's awareness of abnormal movements (rate only patient's report): No Awareness, Dental Status Current problems with teeth and/or dentures?: No Does patient usually wear dentures?: No  CIWA:  CIWA-Ar Total: 1 COWS:  COWS Total Score: 2  Treatment Plan Summary: Daily contact with patient to assess and evaluate symptoms and progress in treatment Medication management  Plan: 1. Medications as follows A)Increase Trazodone 150 mg daily. B)Celexa 20 mg daily C)Increase  Lamictal 125 mg D)Continue Haldol 2. Monitor for adverse effects  3. Encourage active participation  4. Disposition plans in progress    Medical Decision Making Problem Points:  Established problem, worsening (2), New problem, with additional work-up planned (4), Review of last therapy session (1) and Review of psycho-social stressors (1) Data Points:  Decision to obtain old records (1) Review or order clinical lab tests (1) Review of medication regiment & side effects (2)  I certify that inpatient  services furnished can reasonably be expected to improve the patient's condition.   Rainey Kahrs 11/09/2012, 2:16 PM

## 2012-11-09 NOTE — [facility] Group Notes (Signed)
[facility] LCSW Group Therapy  11/09/2012  10:00  Type of Therapy:  Group Therapy  Participation Level:  Active  Participation Quality:  Attentive  Affect:  Appropriate  Cognitive:  Oriented  Insight:  Limited  Engagement in Therapy:  Engaged  Modes of Intervention:  Discussion, Exploration and Socialization  Summary of Progress/Problems:   The main focus of today's process group was to identify the patient's current support system and decide on other supports that can be put in place. Four definitions/levels of support were discussed and an exercise was utilized to show how much stronger we become with additional supports. An emphasis was placed on using counselor, doctor, therapy groups, 12-step groups, and problem-specific support groups to expand supports, as well as doing something different than has been done before.   Came late, spoke in generalities about support of family, but then raised the question of how does one go about getting the support they need.  At the end of group, approached me and stated his question had not been answered by other group members, and could not come up with his own answer when I posed the question back to him.  He was looking for an answer from me, and I told him the answer had to come from within himself.  [place], LCSW 11/09/2012 3:08 PM

## 2012-11-09 NOTE — Progress Notes (Signed)
D) Rates his depression and hopelessness both at a 6. Denies SI and HI. States that he is really having a hard time because of his pain being so high. States that the Fentanyl patch only helps a little with the pain. Verbalized feelings of anger r/t his pain and having to "rest my body and lie down all the time in order to go to the groups". Also frustrated and angry about the fact that "I am unable to preform as a man so I broke up with my girlfriend. She has a new boyfriend who she talks to on the Internet and I have a new girlfriend that I talk to on the Internet. We live together, so I am not alone". A) Brought Pt's medications to him due to his unwillingness to come to the medication room.  Provided with a 1:1. Active listening and therapeutic humor used. R) Pt denies SI and HI. Resting in his room presently.

## 2012-11-10 DIAGNOSIS — F1994 Other psychoactive substance use, unspecified with psychoactive substance-induced mood disorder: Secondary | ICD-10-CM

## 2012-11-10 DIAGNOSIS — F1910 Other psychoactive substance abuse, uncomplicated: Secondary | ICD-10-CM

## 2012-11-10 MED ORDER — CIPROFLOXACIN HCL 750 MG PO TABS: 750.0000 mg | ORAL_TABLET | Freq: Two times a day (BID) | ORAL | Status: DC

## 2012-11-10 MED ORDER — TRAZODONE HCL 150 MG PO TABS: 150.0000 mg | ORAL_TABLET | Freq: Every evening | ORAL | Status: DC | PRN

## 2012-11-10 MED ORDER — LAMOTRIGINE 25 MG PO TABS: 125.0000 mg | ORAL_TABLET | Freq: Every day | ORAL | Status: DC

## 2012-11-10 MED ORDER — HYDROCODONE-ACETAMINOPHEN 5-325 MG PO TABS: 1.0000 | ORAL_TABLET | Freq: Four times a day (QID) | ORAL | Status: DC | PRN

## 2012-11-10 MED ORDER — TRAZODONE HCL 50 MG PO TABS
150.0000 mg | ORAL_TABLET | Freq: Every evening | ORAL | Status: DC | PRN
  Filled 2012-11-10: qty 6

## 2012-11-10 MED ORDER — CITALOPRAM HYDROBROMIDE 20 MG PO TABS: 20.0000 mg | ORAL_TABLET | Freq: Every day | ORAL | Status: DC

## 2012-11-10 MED ORDER — LAMOTRIGINE 25 MG PO TABS
125.0000 mg | ORAL_TABLET | Freq: Every day | ORAL | Status: DC
  Filled 2012-11-10 (×2): qty 1

## 2012-11-10 MED ORDER — POLYETHYLENE GLYCOL 3350 17 G PO PACK: 17.0000 g | PACK | Freq: Two times a day (BID) | ORAL | Status: DC

## 2012-11-10 MED ORDER — DILTIAZEM HCL ER 180 MG PO CP24: 180.0000 mg | ORAL_CAPSULE | Freq: Every day | ORAL | Status: DC

## 2012-11-10 MED ORDER — FENTANYL 25 MCG/HR TD PT72: 1.0000 | MEDICATED_PATCH | TRANSDERMAL | Status: DC

## 2012-11-10 MED ORDER — ASPIRIN 325 MG PO TABS: 325.0000 mg | ORAL_TABLET | Freq: Every day | ORAL | Status: DC

## 2012-11-10 MED ORDER — LAMOTRIGINE 100 MG PO TABS: 100.0000 mg | ORAL_TABLET | Freq: Every day | ORAL | Status: DC

## 2012-11-10 MED ORDER — NICOTINE 14 MG/24HR TD PT24: 1.0000 | MEDICATED_PATCH | TRANSDERMAL | Status: DC

## 2012-11-10 NOTE — Progress Notes (Signed)
Discharge Note:  Patient denied SI and HI.  Denied A/V hallucinations.   Denied pain.  Patient received all his belongings from [facility].  Suicide prevention information given to patient and discussed with patient, who stated he understood and had no questions.  Patient stated he appreciated the kindness and consideration shown by [facility] staff to him while at [facility]. Patient left in cab, planned to go to medical supply store for ileostomy bags/wafers.  Patient had 2 ileostomy bags he took with him and stated he also had medical supplies where he lives.  Patient stated he got $400 from bank before coming to hospital.  Spent some money on pint of liquor.  Not sure if he was intoxicated when he came to [facility], not sure how much money he had with him.  Patient believes $100 is missing.  Nurse requested Everlene Balls to come to admission/discharge room to discuss money situation with patient.  Nurse was advised to complete [facility] about missing money, which patient believes was lost while he was a patient at hospital before coming to [facility].  Pt stated he did not lose money while at [facility].

## 2012-11-10 NOTE — Plan of Care (Signed)
Problem: Ineffective individual coping Goal: STG: Patient will participate in after care plan Patient is attending group and discussing issues. Referral to be made for outpatient follow up.  Lucas Porteous Fergie Sherbert, LCSW 11/07/2012  Outcome: Completed/Met Date Met:  11/10/12 Patient has attended groups and easily engaged in discussions.  Outpatient follow up is scheduled. Lucas Ramaker, LCSW 11/10/2012

## 2012-11-10 NOTE — Progress Notes (Signed)
Psychoeducational Group Note  Date:  11/10/2012 Time:  1100  Group Topic/Focus:  Self Care:   The focus of this group is to help patients understand the importance of self-care in order to improve or restore emotional, physical, spiritual, interpersonal, and financial health.  Participation Level: Did Not Attend  Participation Quality:  Not Applicable  Affect:  Not Applicable  Cognitive:  Not Applicable  Insight:  Not Applicable  Engagement in Group: Not Applicable  Additional Comments:  Patient did not attend group, patient was with doctor, for some time during group.  Karleen Hampshire Brittini 11/10/2012, 6:32 PM

## 2012-11-10 NOTE — [facility] Suicide Risk Assessment (Signed)
Suicide Risk Assessment  Discharge Assessment     Demographic Factors:  Male, Adolescent or young adult, Caucasian, Low socioeconomic status, Unemployed and Disabled  Mental Status Per Nursing Assessment::   On Admission:  Belief that plan would result in death  Current Mental Status by Physician: Mental Status Examination: Patient appeared as per his stated age, casually dressed, and fairly groomed, and maintaining good eye contact. Patient has good mood and his affect was constricted. He has normal rate, rhythm, and volume of speech. His thought process is linear and goal directed. Patient has denied suicidal, homicidal ideations, intentions or plans. Patient has no evidence of auditory or visual hallucinations, delusions, and paranoia. Patient has fair insight judgment and impulse control.  Loss Factors: Decrease in vocational status, Loss of significant relationship, Decline in physical health and Financial problems/change in socioeconomic status  Historical Factors: Impulsivity  Risk Reduction Factors:   Sense of responsibility to family, Religious beliefs about death, Positive social support and Positive therapeutic relationship  Continued Clinical Symptoms:  Severe Anxiety and/or Agitation Depression:   Anhedonia Hopelessness Insomnia Recent sense of peace/wellbeing  Cognitive Features That Contribute To Risk:  Polarized thinking    Suicide Risk:  Mild:  Suicidal ideation of limited frequency, intensity, duration, and specificity.  There are no identifiable plans, no associated intent, mild dysphoria and related symptoms, good self-control (both objective and subjective assessment), few other risk factors, and identifiable protective factors, including available and accessible social support.  Discharge Diagnoses:   AXIS I:  Major Depression, Recurrent severe, Substance Abuse and Substance Induced Mood Disorder AXIS II:  Deferred AXIS III:   Past Medical History   Diagnosis Date  . Depression   . Alcoholic   . Hypertension   . Renal disorder   . Respiratory failure   . CHF (congestive heart failure)   . Perforation bowel   . PNA (pneumonia)   . MRSA (methicillin resistant Staphylococcus aureus)   . Renal failure    AXIS IV:  economic problems, housing problems, occupational problems, other psychosocial or environmental problems, problems related to social environment and problems with primary support group AXIS V:  51-60 moderate symptoms  Plan Of Care/Follow-up recommendations:  Activity:  As tolerated Diet:  Regular  Is patient on multiple antipsychotic therapies at discharge:  No   Has Patient had three or more failed trials of antipsychotic monotherapy by history:  No  Recommended Plan for Multiple Antipsychotic Therapies: Not applicable  Lucas Small,JANARDHAHA R. 11/10/2012, 12:39 PM

## 2012-11-10 NOTE — [facility] Suicide Risk Assessment (Signed)
[facility] INPATIENT:  Family/Significant Other Suicide Prevention Education  Suicide Prevention Education:  Education Completed has been identified by the patient as the family member/significant other with whom the patient will be residing, and identified as the person(s) who will aid the patient in the event of a mental health crisis (suicidal ideations/suicide attempt).  With written consent from the patient, the family member/significant other has been provided the following suicide prevention education, prior to the and/or following the discharge of the patient.  The suicide prevention education provided includes the following:  Suicide risk factors  Suicide prevention and interventions  National Suicide Hotline telephone number  [facility] assessment telephone number  [facility] Emergency Assistance 911  [facility] and/or Residential Mobile Crisis Unit telephone number  Request made of family/significant other to:  Remove weapons (e.g., guns, rifles, knives), all items previously/currently identified as safety concern.  Mother advised patient does not access to guns.  Remove drugs/medications (over-the-counter, prescriptions, illicit drugs), all items previously/currently identified as a safety concern.  The family member/significant other verbalizes understanding of the suicide prevention education information provided.  The family member/significant other agrees to remove the items of safety concern listed above.  Wynn Banker 11/10/2012, 12:40 PM

## 2012-11-10 NOTE — Progress Notes (Addendum)
Pt up and in need of ileostomy bag. Patient initially stated a box of ileostomy bags accompanied him on his admit to [facility] Med room searched and box not there. Some ileostomy bags were located but pt states they are not compatible with his needs. (Per admit note, 2 bags were sent from [facility]). Pt suspecting box might have been stored in locker. Allowed to open and look through sealed bag in locker in the presence of this Clinical research associate and security guard E Hendley as he did not believe this Clinical research associate would be able to identify needed supply. Pt did not locate box however did locate night time ileostomy bag which he believes he can use until supplies can be ordered today. While this Clinical research associate offered to call to [facility] to obtain needed bag pt elected to use bag in his belongings. During his search of his belongings, pt went into wallet and took $20 from existing $200 in cash. Bag of belongings had been sealed upon arrival to [facility] and was not opened (and therefore inventory was not needed). Pt now questioning whether he had more money upon arrival to the ED however etoh level was 229 and therefore memory likely impaired. Pt showed documentation of sealed bag of contents on arrival to [facility] and signed for the money discovered at this time as well as his withdrawal of $20. (See paper chart for signatures) Lucas Small

## 2012-11-10 NOTE — Progress Notes (Signed)
Adult Psychoeducational Group Note  Date:  11/10/2012 Time:  4:13 AM  Group Topic/Focus:  Wrap-Up Group:   The focus of this group is to help patients review their daily goal of treatment and discuss progress on daily workbooks.  Participation Level:  Minimal  Participation Quality:  Sharing  Affect:  Appropriate  Cognitive:  Appropriate  Insight: Limited  Engagement in Group:  Engaged  Modes of Intervention:  Support  Additional Comments:  Patient attended and participated in group tonight. He reported that today was better than yesterday. He had his medication changed today and he is expecting good changes.   Lita Mains [facility] 11/10/2012, 4:13 AM

## 2012-11-10 NOTE — Progress Notes (Signed)
Nurse has talked to [facility] ED and also material dept 62952.  Left message that patient needs 18144 [address] 70 mm 2 3/4 in standard bags and wafers for ileostomy.  Patient stated he could also use 18004 bags and wafers. Patient also stated he wants all his medications at the same time.   Feels some of his meds are being missed, will discuss with MD.

## 2012-11-10 NOTE — [facility] Group Notes (Signed)
[facility] LCSW Group Therapy          Overcoming Obstacles       1:15 -2:30        11/10/2012   3:46 PM     Type of Therapy:  Group Therapy  Participation Level:  Appropriate  Participation Quality:  Appropriate  Affect:  Appropriate  Cognitive:  Attentive Appropriate  Insight:  Engaged  Engagement in Therapy:  Engaged  Modes of Intervention:  Discussion Exploration Problem-Solving Supportive  Summary of Progress/Problems: Patient shared the obstacle he has to overcome is pending surgeries.  He shared he is hopeful the procedures will reverse some of the problems he is currently experiencing.    Wynn Banker 11/10/2012    3:46 PM

## 2012-11-10 NOTE — Discharge Summary (Signed)
Physician Discharge Summary Note  Patient:  Lucas Small is an 47 y.o., male MRN:  [id number] DOB:  19-Feb-1966 Patient phone:  [phone] (home)  Patient address:   [address] [place] [address],   Date of Admission:  11/06/2012 Date of Discharge: 11/10/12  Reason for Admission:  Depression with SI  Discharge Diagnoses: Active Problems:   * No active hospital problems. *  Review of Systems  Constitutional: Negative.   HENT: Negative.   Eyes: Negative.   Respiratory: Negative.   Cardiovascular: Negative.   Gastrointestinal: Negative.   Genitourinary: Negative.   Musculoskeletal: Negative.   Skin: Negative.   Neurological: Negative.   Endo/Heme/Allergies: Negative.   Psychiatric/Behavioral: Positive for depression. Negative for suicidal ideas, hallucinations, memory loss and substance abuse. The patient is nervous/anxious. The patient does not have insomnia.    Axis Diagnosis:   AXIS I:  Major Depression, Recurrent severe, Substance Abuse and Substance Induced Mood Disorder AXIS II:  Deferred AXIS III:   Past Medical History  Diagnosis Date  . Depression   . Alcoholic   . Hypertension   . Renal disorder   . Respiratory failure   . CHF (congestive heart failure)   . Perforation bowel   . PNA (pneumonia)   . MRSA (methicillin resistant Staphylococcus aureus)   . Renal failure    AXIS IV:  economic problems, housing problems, occupational problems, other psychosocial or environmental problems, problems related to social environment and problems with primary support group AXIS V:  61-70 mild symptoms  Level of Care:  OP  Hospital Course:  Lucas Small is an 47 y.o. male who presents to the ED feeling more depressed. CSW met with pt at bedside along with NP. Patient reports SI and formulating a plan but unable to contract for safety. Patient denies HI/AH/VH. Patient reports depression symptoms including; decreased sleep of 1 hr per night, feeling  down, despondent, and feelings of worthlessness. Pt reports history of depression, and previous inpatient hosptializations. Pt current stressors are recent surgeries and having to have an ileostomy bag. Patient also reports recent divorce related to sickness and dysfunction of sexual organs. Patient also reports recent loss of girlfriend due to sexual dysnfunction. Patient also reports current stressor of no permanent housing since returning to [place]. Patient currently living in a hotel week to week. Patient medicaid recently transferred back to [facility]. Pt does receive disability. Patient is open to new primary care doctor now that patient medicaid has been reinstated in [facility].       The duration of stay was four days. The patient was seen and evaluated by the Treatment team consisting of Psychiatrist, NP-C, RN, Case Manager, and Therapist for evaluation and treatment plan with goal of stabilization upon discharge. The patient's physical and mental health problems were identified and treated appropriately. The nursing staff worked with patient to order the correct supplies for his ileostomy. Patient became concerned about abdominal swelling and was sent to the ED for evaluation. He has a follow up appointment with surgery team at [facility].       Multiple modalities of treatment were used including medication, individual and group therapies, unit programming, improved nutrition, physical activity, and family sessions as needed. His pain medications of fentanyl patch were continued along with Celexa 20 mg and Lamictal 100 mg. His Lamictal dosage was increased to 125 mg during hospitalization to help improve mood stability. Patient was started on Trazodone 150 mg at bedtime prn to  help improve quality of sleep. Lucas Small reported that the Trazodone was very effective in helping him to sleep. Patient complained of pain at his abdominal site and ultram 50 mg every six hours as needed for  pain was added to his regimen.      The symptoms of depression were monitored daily by evaluation by clinical provider.  The patient's mental and emotional status was evaluated by a daily self inventory completed by the patient. Patient mentioned several times that he was very depressed about how his health problems had affected his ability to function sexually. However, patient still had hopes of working things out with his ex-girlfriend and felt that he still had worth as a companion.       Improvement was demonstrated by declining numbers on the self assessment, improving vital signs, increased cognition, and improvement in mood, sleep, appetite as well as a reduction in physical symptoms.       The patient was evaluated and found to be stable enough for discharge and was released to home per the initial plan of treatment. Lucas Small reported that he would be going to stay at a friend's house after discharge. He denied SI/HI to the treatment team prior to his discharge.   Mental Status Exam:  For mental status exam please see mental status exam and  suicide risk assessment completed by attending physician prior to discharge.  Consults:  psychiatry  Significant Diagnostic Studies:  radiology: Acute abdomen series  Discharge Vitals:   Blood pressure 101/72, pulse 109, temperature 98.5 F (36.9 C), temperature source Oral, resp. rate 20, height 5' 7.5" (1.715 m), weight 68.947 kg (152 lb), SpO2 98.00%. Body mass index is 23.44 kg/(m^2). Lab Results:   Results for orders placed during the hospital encounter of 11/06/12 (from the past 72 hour(s))  CBC WITH DIFFERENTIAL     Status: Abnormal   Collection Time    11/08/12  7:38 PM      Result Value Range   WBC 8.6  4.0 - 10.5 K/uL   RBC 4.21 (*) 4.22 - 5.81 MIL/uL   Hemoglobin 13.7  13.0 - 17.0 g/dL   HCT 95.6  21.3 - 08.6 %   MCV 96.2  78.0 - 100.0 fL   MCH 32.5  26.0 - 34.0 pg   MCHC 33.8  30.0 - 36.0 g/dL   RDW 57.8 (*) 46.9 - 62.9 %    Platelets 213  150 - 400 K/uL   Neutrophils Relative % 68  43 - 77 %   Neutro Abs 5.9  1.7 - 7.7 K/uL   Lymphocytes Relative 19  12 - 46 %   Lymphs Abs 1.6  0.7 - 4.0 K/uL   Monocytes Relative 9  3 - 12 %   Monocytes Absolute 0.8  0.1 - 1.0 K/uL   Eosinophils Relative 4  0 - 5 %   Eosinophils Absolute 0.3  0.0 - 0.7 K/uL   Basophils Relative 0  0 - 1 %   Basophils Absolute 0.0  0.0 - 0.1 K/uL  BASIC METABOLIC PANEL     Status: Abnormal   Collection Time    11/08/12  7:38 PM      Result Value Range   Sodium 138  135 - 145 mEq/L   Potassium 4.5  3.5 - 5.1 mEq/L   Chloride 103  96 - 112 mEq/L   CO2 28  19 - 32 mEq/L   Glucose, Bld 102 (*) 70 - 99 mg/dL   BUN 16  6 - 23 mg/dL   Creatinine, Ser 0.98  0.50 - 1.35 mg/dL   Calcium 9.3  8.4 - 11.9 mg/dL   GFR calc non Af Amer 74 (*) >90 mL/min   GFR calc Af Amer 85 (*) >90 mL/min   Comment:            The eGFR has been calculated     using the CKD EPI equation.     This calculation has not been     validated in all clinical     situations.     eGFR's persistently     <90 mL/min signify     possible Chronic Kidney Disease.  HEPATIC FUNCTION PANEL     Status: Abnormal   Collection Time    11/08/12  7:38 PM      Result Value Range   Total Protein 6.3  6.0 - 8.3 g/dL   Albumin 3.2 (*) 3.5 - 5.2 g/dL   AST 16  0 - 37 U/L   ALT 16  0 - 53 U/L   Alkaline Phosphatase 101  39 - 117 U/L   Total Bilirubin 0.2 (*) 0.3 - 1.2 mg/dL   Bilirubin, Direct <1.4  0.0 - 0.3 mg/dL   Comment: REPEATED TO VERIFY   Indirect Bilirubin NOT CALCULATED  0.3 - 0.9 mg/dL    Physical Findings: AIMS: Facial and Oral Movements Muscles of Facial Expression: None, normal Lips and Perioral Area: None, normal Jaw: None, normal Tongue: None, normal,Extremity Movements Upper (arms, wrists, hands, fingers): None, normal Lower (legs, knees, ankles, toes): None, normal, Trunk Movements Neck, shoulders, hips: None, normal, Overall Severity Severity of abnormal  movements (highest score from questions above): None, normal Incapacitation due to abnormal movements: None, normal Patient's awareness of abnormal movements (rate only patient's report): No Awareness, Dental Status Current problems with teeth and/or dentures?: No Does patient usually wear dentures?: No  CIWA:  CIWA-Ar Total: 1 COWS:  COWS Total Score: 3  Psychiatric Specialty Exam: See Psychiatric Specialty Exam and Suicide Risk Assessment completed by Attending Physician prior to discharge.  Discharge destination:  Home  Is patient on multiple antipsychotic therapies at discharge:  No   Has Patient had three or more failed trials of antipsychotic monotherapy by history:  No  Recommended Plan for Multiple Antipsychotic Therapies: N/A     Medication List    TAKE these medications     Indication   acetaminophen 325 MG tablet  Commonly known as:  TYLENOL  Take 650 mg by mouth every 4 (four) hours as needed for pain.      aspirin 325 MG tablet  Take 1 tablet (325 mg total) by mouth daily.   Indication:  Mild to Moderate Pain, Heart Attack     citalopram 20 MG tablet  Commonly known as:  CELEXA  Take 1 tablet (20 mg total) by mouth daily.   Indication:  Depression     diltiazem 180 MG 24 hr capsule  Commonly known as:  DILACOR XR  Take 1 capsule (180 mg total) by mouth daily.   Indication:  High Blood Pressure     fentaNYL 25 MCG/HR  Commonly known as:  DURAGESIC - dosed mcg/hr  Place 1 patch (25 mcg total) onto the skin every 3 (three) days.   Indication:  Moderate to Severe Chronic Pain     HYDROcodone-acetaminophen 5-325 MG per tablet  Commonly known as:  NORCO/VICODIN  Take 1 tablet by mouth every 6 (six) hours as needed for pain.  Indication:  Moderate to Moderately Severe Pain     lamoTRIgine 100 MG tablet  Commonly known as:  LAMICTAL  Take 1 tablet (100 mg total) by mouth daily.   Indication:  Depression, Depressive Phase of Manic-Depression      metoprolol succinate 100 MG 24 hr tablet  Commonly known as:  TOPROL-XL  Take 100 mg by mouth daily. Take with or immediately following a meal.      nicotine 14 mg/24hr patch  Commonly known as:  NICODERM CQ - dosed in mg/24 hours  Place 1 patch onto the skin daily.   Indication:  Nicotine Addiction     polyethylene glycol packet  Commonly known as:  MIRALAX / GLYCOLAX  Take 17 g by mouth 2 (two) times daily.   Indication:  Constipation     traZODone 150 MG tablet  Commonly known as:  DESYREL  Take 1 tablet (150 mg total) by mouth at bedtime as needed. For sleep   Indication:  Trouble Sleeping, Major Depressive Disorder           Follow-up Information   Follow up with [facility] On 11/13/2012. (Please go to [facility] walk-in clinic on Thursday, November 13, 2012 or any weekday between 8AM - 3PM for medication management.)    Contact information:   [address] [place], [place]   [address]  [phone]      Follow up with [facility] In 1 week.   Contact information:   [address] [place] [address] [phone]      Follow-up recommendations:  Activity:  Resume usual activities.  Diet:  Regular  Comments:   Take all your medications as prescribed by your mental healthcare provider.  Report any adverse effects and or reactions from your medicines to your outpatient provider promptly.  Patient is instructed and cautioned to not engage in alcohol and or illegal drug use while on prescription medicines.  In the event of worsening symptoms, patient is instructed to call the crisis hotline, 911 and or go to the nearest ED for appropriate evaluation and treatment of symptoms.  Follow-up with your primary care provider for your other medical issues, concerns and or health care needs.   Total Discharge Time:  Greater than 30 minutes.  SignedFransisca Kaufmann NP-C 11/10/2012, 1:19 PM  Patient was personally examined and case discussed with the  physician extender and developed treatment plan. Reviewed the information documented and agree with the discharge treatment plan.  Lucas Small,Lucas R. 11/11/2012 6:50 PM

## 2012-11-10 NOTE — Tx Team (Signed)
Interdisciplinary Treatment Plan Update   Date Reviewed:  11/10/2012  Time Reviewed:  9:56 AM  Progress in Treatment:   Attending groups: Yes Participating in groups: Yes Taking medication as prescribed: Yes  Tolerating medication: Yes Family/Significant other contact made: Yes, contact made with mother. Patient understands diagnosis: Yes  Discussing patient identified problems/goals with staff: Yes Medical problems stabilized or resolved: Yes Denies suicidal/homicidal ideation: Yes Patient has not harmed self or others: Yes  For review of initial/current patient goals, please see plan of care.  Estimated Length of Stay:  Discharge today  Reasons for Continued Hospitalization:   New Problems/Goals identified:    Discharge Plan or Barriers:   Multiple medical problems and lack of permanent housing.  Additional Comments:  Patient reports doing better and reports plans to stay with a friend at discharge.  Patient will follow up with [facility]  Attendees:  Patient:  Lucas Small 11/10/2012 9:56 AM   Signature: Mervyn Gay, MD 11/10/2012 9:56 AM  Signature: 11/10/2012 9:56 AM  Signature:  11/10/2012 9:56 AM  Signature:Beverly Terrilee Croak, RN 11/10/2012 9:56 AM  Signature:  Neill Loft RN 11/10/2012 9:56 AM  Signature:  Juline Patch, LCSW 11/10/2012 9:56 AM  Signature:  11/10/2012 9:56 AM  Signature:  Maseta Dorley,Care Coordinator 11/10/2012 9:56 AM  Signature: Fransisca Kaufmann, [facility] 11/10/2012 9:56 AM  Signature:    Signature:    Signature:      Scribe for Treatment Team:   Juline Patch,  11/10/2012 9:56 AM

## 2012-11-10 NOTE — Plan of Care (Signed)
Problem: Alteration in mood & ability to function due to Goal: LTG-Patient demonstrates decreased signs of withdrawal (Patient demonstrates decreased signs of withdrawal to the point the patient is safe to return home and continue treatment in an outpatient setting)  Outcome: Completed/Met Date Met:  11/10/12 Patient is not endorsing any sign/symptoms of withdrawal at this time.  Horace Porteous Caidan Hubbert, LCSW 11/10/2012

## 2012-11-10 NOTE — Progress Notes (Signed)
[facility] Adult Case Management Discharge Plan :  Will you be returning to the same living situation after discharge: Yes,  Patient to return to friend's home At discharge, do you have transportation home?:  Patient to be assisted with a bus pass.                                                                           Do you have the ability to pay for your medications:Yes,  Patient has Medicaid  Release of information consent forms completed and in the chart;  Patient's signature needed at discharge.  Patient to Follow up at: Follow-up Information   Follow up with [facility] On 11/13/2012. (Please go to [facility] walk-in clinic on Thursday, November 13, 2012 or any weekday between 8AM - 3PM for medication management.)    Contact information:   [address] [place], [place]   [address]  [phone]      Follow up with [facility] In 1 week.   Contact information:   [address] [place] [address] [phone]      Patient denies SI/HI:   Patient no longer endorsing SI/HI or other thoughts of self harm.     Safety Planning and Suicide Prevention discussed: .Reviewed with all patients during discharge planning group  Dailin Sosnowski, [place] July 11/10/2012, 12:44 PM

## 2012-11-10 NOTE — Progress Notes (Signed)
D:  Patient's self inventory sheet, patient has fair sleep, good appetite, low energy level, improving attention span.  Rated depression and hopelessness #4, anxiety #5.  Denied withdrawals.  Denied SI.   Has experienced pain, dizziness in past 24 hours.  Worst pain #7, zero pain goal.   "Listen to my inner self."  No discharge plans.  No problems taking meds. A:  Medications administered per MD orders.  Emotional support and encouragement given patient. R:  Denied SI and HI.  Denied A/V hallucinations.  Will continue to monitor patient for safety with 15 minute checks.  Safety maintained.

## 2012-11-11 LAB — CULTURE, BLOOD (ROUTINE X 2): Culture: NO GROWTH

## 2012-11-12 NOTE — Progress Notes (Signed)
Patient Discharge Instructions:  After Visit Summary (AVS):   Faxed to:  11/12/12 Discharge Summary Note:   Faxed to:  11/12/12 Psychiatric Admission Assessment Note:   Faxed to:  11/12/12 Suicide Risk Assessment - Discharge Assessment:   Faxed to:  11/12/12 Faxed/Sent to the Next Level Care provider:  11/12/12 Faxed to [facility] @ [phone]  Jerelene Redden, 11/12/2012, 3:02 PM

## 2012-11-21 ENCOUNTER — Emergency Department (HOSPITAL_COMMUNITY)
Admission: EM | Admit: 2012-11-21 | Discharge: 2012-11-23 | Disposition: A | Discharge status: Other Acute Inpatient Hospital | Payer: Medicaid Other | Attending: Emergency Medicine | Admitting: Emergency Medicine

## 2012-11-21 ENCOUNTER — Encounter (HOSPITAL_COMMUNITY): Payer: Self-pay | Admitting: Emergency Medicine

## 2012-11-21 DIAGNOSIS — Z8614 Personal history of Methicillin resistant Staphylococcus aureus infection: Secondary | ICD-10-CM | POA: Insufficient documentation

## 2012-11-21 DIAGNOSIS — R45851 Suicidal ideations: Secondary | ICD-10-CM | POA: Insufficient documentation

## 2012-11-21 DIAGNOSIS — I10 Essential (primary) hypertension: Secondary | ICD-10-CM | POA: Insufficient documentation

## 2012-11-21 DIAGNOSIS — Z932 Ileostomy status: Secondary | ICD-10-CM | POA: Insufficient documentation

## 2012-11-21 DIAGNOSIS — N19 Unspecified kidney failure: Secondary | ICD-10-CM | POA: Insufficient documentation

## 2012-11-21 DIAGNOSIS — R110 Nausea: Secondary | ICD-10-CM | POA: Insufficient documentation

## 2012-11-21 DIAGNOSIS — Z79899 Other long term (current) drug therapy: Secondary | ICD-10-CM | POA: Insufficient documentation

## 2012-11-21 DIAGNOSIS — F17200 Nicotine dependence, unspecified, uncomplicated: Secondary | ICD-10-CM | POA: Insufficient documentation

## 2012-11-21 DIAGNOSIS — F319 Bipolar disorder, unspecified: Secondary | ICD-10-CM | POA: Insufficient documentation

## 2012-11-21 DIAGNOSIS — Z7982 Long term (current) use of aspirin: Secondary | ICD-10-CM | POA: Insufficient documentation

## 2012-11-21 DIAGNOSIS — Z8719 Personal history of other diseases of the digestive system: Secondary | ICD-10-CM | POA: Insufficient documentation

## 2012-11-21 DIAGNOSIS — Z87448 Personal history of other diseases of urinary system: Secondary | ICD-10-CM | POA: Insufficient documentation

## 2012-11-21 DIAGNOSIS — Z8709 Personal history of other diseases of the respiratory system: Secondary | ICD-10-CM | POA: Insufficient documentation

## 2012-11-21 DIAGNOSIS — Z8701 Personal history of pneumonia (recurrent): Secondary | ICD-10-CM | POA: Insufficient documentation

## 2012-11-21 DIAGNOSIS — I509 Heart failure, unspecified: Secondary | ICD-10-CM | POA: Insufficient documentation

## 2012-11-21 DIAGNOSIS — R45850 Homicidal ideations: Secondary | ICD-10-CM | POA: Insufficient documentation

## 2012-11-21 LAB — COMPREHENSIVE METABOLIC PANEL
ALT: 13 U/L (ref 0–53)
Calcium: 10 mg/dL (ref 8.4–10.5)
Creatinine, Ser: 0.82 mg/dL (ref 0.50–1.35)
GFR calc Af Amer: >90 mL/min (ref >90)
Glucose, Bld: 98 mg/dL (ref 70–99)
Sodium: 134 mEq/L — ABNORMAL LOW (ref 135–145)
Total Protein: 7.9 g/dL (ref 6.0–8.3)

## 2012-11-21 LAB — RAPID URINE DRUG SCREEN, HOSP PERFORMED
Barbiturates: NOT DETECTED
Benzodiazepines: NOT DETECTED
Cocaine: NOT DETECTED
Opiates: NOT DETECTED

## 2012-11-21 LAB — ACETAMINOPHEN LEVEL: Acetaminophen (Tylenol), Serum: <15 ug/mL (ref 10–30)

## 2012-11-21 LAB — CBC WITH DIFFERENTIAL/PLATELET
Basophils Absolute: 0.1 10*3/uL (ref 0.0–0.1)
Eosinophils Absolute: 0.2 10*3/uL (ref 0.0–0.7)
Eosinophils Relative: 2 % (ref 0–5)
MCH: 33.3 pg (ref 26.0–34.0)
MCV: 93.1 fL (ref 78.0–100.0)
Platelets: 365 10*3/uL (ref 150–400)
RDW: 16.6 % — ABNORMAL HIGH (ref 11.5–15.5)

## 2012-11-21 LAB — URINALYSIS, ROUTINE W REFLEX MICROSCOPIC
Bilirubin Urine: NEGATIVE
Glucose, UA: NEGATIVE mg/dL
Ketones, ur: NEGATIVE mg/dL
Nitrite: NEGATIVE
pH: 6 (ref 5.0–8.0)

## 2012-11-21 LAB — SALICYLATE LEVEL: Salicylate Lvl: <2 mg/dL — ABNORMAL LOW (ref 2.8–20.0)

## 2012-11-21 MED ORDER — ACETAMINOPHEN 325 MG PO TABS: 650.0000 mg | ORAL_TABLET | ORAL | Status: DC | PRN

## 2012-11-21 MED ORDER — IBUPROFEN 600 MG PO TABS: 600.0000 mg | ORAL_TABLET | Freq: Three times a day (TID) | ORAL | Status: DC | PRN

## 2012-11-21 MED ORDER — CIPROFLOXACIN HCL 500 MG PO TABS: 750.0000 mg | ORAL_TABLET | Freq: Two times a day (BID) | ORAL | Status: DC

## 2012-11-21 MED ORDER — CITALOPRAM HYDROBROMIDE 20 MG PO TABS
20.0000 mg | ORAL_TABLET | Freq: Every day | ORAL | Status: DC
  Filled 2012-11-21: qty 1

## 2012-11-21 MED ORDER — DILTIAZEM HCL ER 180 MG PO CP24
180.0000 mg | ORAL_CAPSULE | Freq: Every day | ORAL | Status: DC
  Filled 2012-11-21: qty 1

## 2012-11-21 MED ORDER — ASPIRIN 325 MG PO TABS
325.0000 mg | ORAL_TABLET | Freq: Every day | ORAL | Status: DC
  Administered 2012-11-21: 325 mg via ORAL
  Filled 2012-11-21: qty 1

## 2012-11-21 MED ORDER — NICOTINE 14 MG/24HR TD PT24: 14.0000 mg | MEDICATED_PATCH | TRANSDERMAL | Status: DC

## 2012-11-21 MED ORDER — LAMOTRIGINE 25 MG PO TABS
125.0000 mg | ORAL_TABLET | Freq: Every day | ORAL | Status: DC
  Filled 2012-11-21: qty 1

## 2012-11-21 MED ORDER — ZOLPIDEM TARTRATE 5 MG PO TABS
5.0000 mg | ORAL_TABLET | Freq: Every evening | ORAL | Status: DC | PRN
  Administered 2012-11-22: 5 mg via ORAL
  Filled 2012-11-21: qty 1

## 2012-11-21 MED ORDER — ALUM & MAG HYDROXIDE-SIMETH 200-200-20 MG/5ML PO SUSP
30.0000 mL | ORAL | Status: DC | PRN
  Administered 2012-11-21: 30 mL via ORAL
  Filled 2012-11-21: qty 30

## 2012-11-21 MED ORDER — HYDROCODONE-ACETAMINOPHEN 5-325 MG PO TABS
1.0000 | ORAL_TABLET | Freq: Four times a day (QID) | ORAL | Status: DC | PRN
  Administered 2012-11-22 (×2): 1 via ORAL
  Filled 2012-11-21 (×2): qty 1

## 2012-11-21 MED ORDER — ONDANSETRON HCL 4 MG PO TABS
4.0000 mg | ORAL_TABLET | Freq: Three times a day (TID) | ORAL | Status: DC | PRN
  Administered 2012-11-22: 4 mg via ORAL
  Filled 2012-11-21: qty 1

## 2012-11-21 MED ORDER — PERMETHRIN 5 % EX CREA
TOPICAL_CREAM | Freq: Once | CUTANEOUS | Status: AC
  Administered 2012-11-21: 18:00:00 via TOPICAL
  Filled 2012-11-21: qty 60

## 2012-11-21 MED ORDER — NICOTINE 21 MG/24HR TD PT24
21.0000 mg | MEDICATED_PATCH | Freq: Every day | TRANSDERMAL | Status: DC
  Administered 2012-11-21: 21 mg via TRANSDERMAL
  Filled 2012-11-21: qty 1

## 2012-11-21 MED ORDER — LORAZEPAM 1 MG PO TABS
1.0000 mg | ORAL_TABLET | Freq: Three times a day (TID) | ORAL | Status: DC | PRN
  Administered 2012-11-22 (×2): 1 mg via ORAL
  Filled 2012-11-21 (×2): qty 1

## 2012-11-21 MED ORDER — POLYETHYLENE GLYCOL 3350 17 G PO PACK
17.0000 g | PACK | Freq: Two times a day (BID) | ORAL | Status: DC
  Filled 2012-11-21: qty 1

## 2012-11-21 MED ORDER — METOPROLOL SUCCINATE ER 100 MG PO TB24
100.0000 mg | ORAL_TABLET | Freq: Every day | ORAL | Status: DC
Start: 2012-11-21 — End: 2012-11-21
  Filled 2012-11-21: qty 1

## 2012-11-21 NOTE — ED Notes (Signed)
One large red biohazard bag in decon room with pt clothing, 5 packs of medication, nook, headphones, and charger with pt label searched by Lenny/tech, Nimisha Rathel/tech, Diane/nurse, and security.
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
## 2012-11-21 NOTE — ED Notes (Signed)
Pt came in with 3 bags, one with tennis shoes, sweet pants, 4 packs of medication, pack of halls cough drops, a box with papers in it.
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
## 2012-11-21 NOTE — ED Notes (Signed)
Patient applied cream from neck to feet per instructions

## 2012-11-21 NOTE — ED Notes (Signed)
Pt given instructions on applying cream

## 2012-11-22 DIAGNOSIS — F329 Major depressive disorder, single episode, unspecified: Secondary | ICD-10-CM

## 2012-11-22 DIAGNOSIS — F1010 Alcohol abuse, uncomplicated: Secondary | ICD-10-CM

## 2012-11-22 DIAGNOSIS — R45851 Suicidal ideations: Secondary | ICD-10-CM

## 2012-11-22 DIAGNOSIS — R45850 Homicidal ideations: Secondary | ICD-10-CM

## 2012-11-22 MED ORDER — PERMETHRIN 1 % EX LOTN
TOPICAL_LOTION | Freq: Once | CUTANEOUS | Status: AC
  Administered 2012-11-22: 17:00:00 via TOPICAL
  Filled 2012-11-22: qty 59

## 2012-11-22 MED ORDER — PERMETHRIN 5 % EX CREA
TOPICAL_CREAM | Freq: Once | CUTANEOUS | Status: AC
  Administered 2012-11-22: 17:00:00 via TOPICAL
  Filled 2012-11-22: qty 60

## 2012-11-22 MED ORDER — NICOTINE 21 MG/24HR TD PT24
21.0000 mg | MEDICATED_PATCH | Freq: Once | TRANSDERMAL | Status: DC
  Administered 2012-11-22: 21 mg via TRANSDERMAL
  Filled 2012-11-22: qty 1

## 2012-11-22 NOTE — ED Notes (Signed)
Has been asleep since about 11:00. Every time I try to wake him up he opens his eyes for a second and then goes back to sleep. Was given PRN Ativan 1mg  at 10:21.

## 2012-11-22 NOTE — ED Notes (Signed)
Pt is mobile and able to ambulate without assistance.  Pt now doing his own ileostomy care.  Pt has already been treated for his scabies and is now being taken off contact precautions.

## 2012-11-22 NOTE — ED Notes (Addendum)
Pt's belongings are double bagged in the DECON ROOM in the EMS bay.  Psych ED staff, Elon Jester, RN aware.

## 2012-11-22 NOTE — ED Notes (Signed)
Pt told ACT CSW that he has various plans to kill himself and will not contract for safety if discharged from hospital. Pt is able to contract for safety while in hospital he said because all his pills were taken already.

## 2012-11-22 NOTE — ED Notes (Signed)
Service response-environmental services-notified twice concerning room 40 and bathroom BH2 needing to be cleaned.
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
## 2012-11-22 NOTE — ED Notes (Signed)
Directed to the shower-instructions given on how to apply lotion and cream.

## 2012-11-22 NOTE — Consult Note (Signed)
Reason for Consult:  Suicidal and Homicidal Ideation. Referring Physician: [name] is an 47 y.o. male.  HPI: Lucas Small is a caucasian male who came for suicidal and homicidal thoughts towards his roommates after an argument.  Patient also had blood alcohol level of 118 on arrival.  He was discharged from our inpatient chemical dependency unit for depression.  He also has ileostomy with loose stool. Which he takes care of by himself.  From EMS note, since his discharge he has not taken any of his discharge medications.  This morning, he denies suicide but stated "I need help for my depression and drinking"  Patient also came in with bedbug infestation  and scabies and is being treated with Permethrin. Lotion and shampoo.  We will keep him in [facility] and complete  the Permethrin treatment before considering appropriate disposition.  We are going to initiate alcohol detoxification using Ativan.  Past Medical History  Diagnosis Date  . Depression   . Alcoholic   . Hypertension   . Renal disorder   . Respiratory failure   . CHF (congestive heart failure)   . Perforation bowel   . PNA (pneumonia)   . MRSA (methicillin resistant Staphylococcus aureus)   . Renal failure     Past Surgical History  Procedure Laterality Date  . Abdominal surgery    . Cardiac surgery    . Peripherally inserted central catheter insertion    . Ileostomy      History reviewed. No pertinent family history.  Social History:  reports that he has been smoking Cigarettes.  He has a 37.5 pack-year smoking history. He does not have any smokeless tobacco history on file. He reports that  drinks alcohol. He reports that he uses illicit drugs (Cocaine and Marijuana).  Allergies:  Allergies  Allergen Reactions  . Bee Venom Anaphylaxis    Medications: I have reviewed the patient's current medications.  Results for orders placed during the hospital encounter of 11/21/12 (from the past 48 hour(s))  CBC  WITH DIFFERENTIAL     Status: Abnormal   Collection Time    11/21/12  4:44 PM      Result Value Range   WBC 11.3 (*) 4.0 - 10.5 K/uL   RBC 5.25  4.22 - 5.81 MIL/uL   Hemoglobin 17.5 (*) 13.0 - 17.0 g/dL   HCT 96.0  45.4 - 09.8 %   MCV 93.1  78.0 - 100.0 fL   MCH 33.3  26.0 - 34.0 pg   MCHC 35.8  30.0 - 36.0 g/dL   RDW 11.9 (*) 14.7 - 82.9 %   Platelets 365  150 - 400 K/uL   Neutrophils Relative % 77  43 - 77 %   Neutro Abs 8.6 (*) 1.7 - 7.7 K/uL   Lymphocytes Relative 16  12 - 46 %   Lymphs Abs 1.8  0.7 - 4.0 K/uL   Monocytes Relative 5  3 - 12 %   Monocytes Absolute 0.6  0.1 - 1.0 K/uL   Eosinophils Relative 2  0 - 5 %   Eosinophils Absolute 0.2  0.0 - 0.7 K/uL   Basophils Relative 1  0 - 1 %   Basophils Absolute 0.1  0.0 - 0.1 K/uL  COMPREHENSIVE METABOLIC PANEL     Status: Abnormal   Collection Time    11/21/12  4:44 PM      Result Value Range   Sodium 134 (*) 135 - 145 mEq/L   Potassium  3.4 (*) 3.5 - 5.1 mEq/L   Chloride 95 (*) 96 - 112 mEq/L   CO2 19  19 - 32 mEq/L   Glucose, Bld 98  70 - 99 mg/dL   BUN 9  6 - 23 mg/dL   Creatinine, Ser 1.61  0.50 - 1.35 mg/dL   Calcium 09.6  8.4 - 04.5 mg/dL   Total Protein 7.9  6.0 - 8.3 g/dL   Albumin 3.8  3.5 - 5.2 g/dL   AST 20  0 - 37 U/L   ALT 13  0 - 53 U/L   Alkaline Phosphatase 204 (*) 39 - 117 U/L   Total Bilirubin 0.3  0.3 - 1.2 mg/dL   GFR calc non Af Amer >90  >90 mL/min   GFR calc Af Amer >90  >90 mL/min   Comment:            The eGFR has been calculated     using the CKD EPI equation.     This calculation has not been     validated in all clinical     situations.     eGFR's persistently     <90 mL/min signify     possible Chronic Kidney Disease.  ETHANOL     Status: Abnormal   Collection Time    11/21/12  4:44 PM      Result Value Range   Alcohol, Ethyl (B) 118 (*) 0 - 11 mg/dL   Comment:            LOWEST DETECTABLE LIMIT FOR     SERUM ALCOHOL IS 11 mg/dL     FOR MEDICAL PURPOSES ONLY  ACETAMINOPHEN  LEVEL     Status: None   Collection Time    11/21/12  4:44 PM      Result Value Range   Acetaminophen (Tylenol), Serum <15.0  10 - 30 ug/mL   Comment:            THERAPEUTIC CONCENTRATIONS VARY     SIGNIFICANTLY. A RANGE OF 10-30     ug/mL MAY BE AN EFFECTIVE     CONCENTRATION FOR MANY PATIENTS.     HOWEVER, SOME ARE BEST TREATED     AT CONCENTRATIONS OUTSIDE THIS     RANGE.     ACETAMINOPHEN CONCENTRATIONS     >150 ug/mL AT 4 HOURS AFTER     INGESTION AND >50 ug/mL AT 12     HOURS AFTER INGESTION ARE     OFTEN ASSOCIATED WITH TOXIC     REACTIONS.  SALICYLATE LEVEL     Status: Abnormal   Collection Time    11/21/12  4:44 PM      Result Value Range   Salicylate Lvl <2.0 (*) 2.8 - 20.0 mg/dL  URINE RAPID DRUG SCREEN (HOSP PERFORMED)     Status: None   Collection Time    11/21/12  6:22 PM      Result Value Range   Opiates NONE DETECTED  NONE DETECTED   Cocaine NONE DETECTED  NONE DETECTED   Benzodiazepines NONE DETECTED  NONE DETECTED   Amphetamines NONE DETECTED  NONE DETECTED   Tetrahydrocannabinol NONE DETECTED  NONE DETECTED   Barbiturates NONE DETECTED  NONE DETECTED   Comment:            DRUG SCREEN FOR MEDICAL PURPOSES     ONLY.  IF CONFIRMATION IS NEEDED     FOR ANY PURPOSE, NOTIFY LAB     WITHIN 5 DAYS.  LOWEST DETECTABLE LIMITS     FOR URINE DRUG SCREEN     Drug Class       Cutoff (ng/mL)     Amphetamine      1000     Barbiturate      200     Benzodiazepine   200     Tricyclics       300     Opiates          300     Cocaine          300     THC              50  URINALYSIS, ROUTINE W REFLEX MICROSCOPIC     Status: Abnormal   Collection Time    11/21/12  6:22 PM      Result Value Range   Color, Urine YELLOW  YELLOW   APPearance CLEAR  CLEAR   Specific Gravity, Urine 1.016  1.005 - 1.030   pH 6.0  5.0 - 8.0   Glucose, UA NEGATIVE  NEGATIVE mg/dL   Hgb urine dipstick Small (*) NEGATIVE   Bilirubin Urine NEGATIVE  NEGATIVE   Ketones, ur  NEGATIVE  NEGATIVE mg/dL   Protein, ur 30 (*) NEGATIVE mg/dL   Urobilinogen, UA 0.2  0.0 - 1.0 mg/dL   Nitrite NEGATIVE  NEGATIVE   Leukocytes, UA NEGATIVE  NEGATIVE  URINE MICROSCOPIC-ADD ON     Status: Abnormal   Collection Time    11/21/12  6:22 PM      Result Value Range   Squamous Epithelial / LPF FEW (*) RARE   WBC, UA 0-2  <3 WBC/hpf   RBC / HPF 3-6  <3 RBC/hpf   Bacteria, UA FEW (*) RARE   Urine-Other MUCOUS PRESENT    GLUCOSE, CAPILLARY     Status: Abnormal   Collection Time    11/21/12  8:13 PM      Result Value Range   Glucose-Capillary 100 (*) 70 - 99 mg/dL    No results found.  Review of Systems  Constitutional: Negative.  Negative for fever, chills, weight loss, malaise/fatigue and diaphoresis.  HENT: Negative.  Negative for hearing loss, ear pain, nosebleeds, congestion, sore throat, tinnitus and ear discharge.   Eyes: Negative.  Negative for blurred vision, double vision, photophobia, pain, discharge and redness.  Respiratory: Positive for cough (Reports occassional dry cough yesterday). Negative for hemoptysis, sputum production, shortness of breath, wheezing and stridor.   Cardiovascular: Negative.  Negative for chest pain, palpitations, orthopnea, claudication, leg swelling and PND.  Gastrointestinal: Positive for diarrhea (reports occassional loose stool from his ileostomy bag.) and blood in stool (Reports occassional oozing of blood from his ileostomy site). Negative for heartburn, nausea, vomiting, abdominal pain and constipation.  Genitourinary: Negative.   Musculoskeletal: Positive for joint pain (Reports occassional joint pain, takes Ibuprofen). Myalgias: Reports occassional muscle pain self medicate with alcohol and over the counter medications.  Skin: Negative.  Negative for itching and rash.  Neurological: Negative.  Negative for weakness and headaches.  Endo/Heme/Allergies: Negative.   Psychiatric/Behavioral: Positive for depression (Rates depression  10/10) and substance abuse (Reports daily drink of 6 bottles of 12 oz beer.). Negative for suicidal ideas, hallucinations and memory loss. The patient is nervous/anxious (reports anxiety 8/10) and has insomnia (Report good sleep last night with Ambien for sleep.).    Blood pressure 112/82, pulse 86, temperature 98.6 F (37 C), temperature source Oral, resp. rate 18, SpO2 93.00%. Physical Exam  Constitutional: He is oriented to person, place, and time. He appears well-developed.  HENT:  Head: Normocephalic and atraumatic.  Eyes: Conjunctivae and EOM are normal.  Neck: Normal range of motion. Neck supple.  Cardiovascular: Normal rate, regular rhythm, normal heart sounds and intact distal pulses.   Respiratory: Effort normal and breath sounds normal. No respiratory distress. He has no wheezes. He has no rales. He exhibits no tenderness.  GI: Soft. Bowel sounds are normal.  Ileostomy stoma intact with minimal amount of loose stool.  Musculoskeletal: Normal range of motion.  Neurological: He is alert and oriented to person, place, and time.  Skin: Skin is warm and dry.      Assessment/Plan:  We will provide safety and stability at this time until he completes treatment for scabies and bed bugs with Permethrin lotion and shampoo.   We will monitor in the [facility] ER till medically cleared and then seek appropriate disposition. We will initiate alcohol detoxification with Ativan.   Dahlia Byes, C  PMHNP-BC 11/22/2012, 12:23 PM

## 2012-11-22 NOTE — BH Assessment (Signed)
[facility] Assessment Progress Note   Pt will need to be cleared of scabies, etc prior to ACT assessing and being able to place patient appropriately.    Pt only had cream on for 3 hours when it needed to be on for 14 hours and is still itching and contagious.    No Provider will consider until pt is medically cleared and can use common areas, etc.

## 2012-11-23 ENCOUNTER — Inpatient Hospital Stay (HOSPITAL_COMMUNITY)
Admission: AD | Admit: 2012-11-23 | Discharge: 2012-11-29 | DRG: 885 | Disposition: A | Discharge status: Home / Self Care | Payer: Federal, State, Local not specified - Other | Source: Intra-hospital | Attending: Psychiatry | Admitting: Psychiatry

## 2012-11-23 ENCOUNTER — Encounter (HOSPITAL_COMMUNITY): Payer: Self-pay

## 2012-11-23 DIAGNOSIS — F339 Major depressive disorder, recurrent, unspecified: Secondary | ICD-10-CM | POA: Diagnosis present

## 2012-11-23 DIAGNOSIS — N529 Male erectile dysfunction, unspecified: Secondary | ICD-10-CM | POA: Diagnosis present

## 2012-11-23 DIAGNOSIS — F332 Major depressive disorder, recurrent severe without psychotic features: Principal | ICD-10-CM | POA: Diagnosis present

## 2012-11-23 DIAGNOSIS — Z79899 Other long term (current) drug therapy: Secondary | ICD-10-CM

## 2012-11-23 DIAGNOSIS — F1020 Alcohol dependence, uncomplicated: Secondary | ICD-10-CM | POA: Diagnosis present

## 2012-11-23 DIAGNOSIS — F1910 Other psychoactive substance abuse, uncomplicated: Secondary | ICD-10-CM

## 2012-11-23 DIAGNOSIS — I509 Heart failure, unspecified: Secondary | ICD-10-CM | POA: Diagnosis present

## 2012-11-23 DIAGNOSIS — F1994 Other psychoactive substance use, unspecified with psychoactive substance-induced mood disorder: Secondary | ICD-10-CM

## 2012-11-23 DIAGNOSIS — I10 Essential (primary) hypertension: Secondary | ICD-10-CM | POA: Diagnosis present

## 2012-11-23 MED ORDER — DILTIAZEM HCL ER COATED BEADS 180 MG PO CP24
180.0000 mg | ORAL_CAPSULE | Freq: Every day | ORAL | Status: DC
  Administered 2012-11-24 – 2012-11-29 (×4): 180 mg via ORAL
  Filled 2012-11-23 (×9): qty 1

## 2012-11-23 MED ORDER — LAMOTRIGINE 25 MG PO TABS
125.0000 mg | ORAL_TABLET | Freq: Every day | ORAL | Status: DC
  Administered 2012-11-24 – 2012-11-29 (×4): 125 mg via ORAL
  Filled 2012-11-23: qty 14
  Filled 2012-11-23 (×7): qty 1
  Filled 2012-11-23: qty 14
  Filled 2012-11-23: qty 1

## 2012-11-23 MED ORDER — ALUM & MAG HYDROXIDE-SIMETH 200-200-20 MG/5ML PO SUSP: 30.0000 mL | Freq: Four times a day (QID) | ORAL | Status: DC | PRN

## 2012-11-23 MED ORDER — TRAZODONE HCL 50 MG PO TABS
150.0000 mg | ORAL_TABLET | Freq: Every evening | ORAL | Status: DC | PRN
  Administered 2012-11-23 – 2012-11-24 (×2): 150 mg via ORAL
  Administered 2012-11-26 – 2012-11-27 (×2): 100 mg via ORAL
  Administered 2012-11-28: 150 mg via ORAL
  Filled 2012-11-23 (×7): qty 1

## 2012-11-23 MED ORDER — NICOTINE 14 MG/24HR TD PT24
14.0000 mg | MEDICATED_PATCH | Freq: Every day | TRANSDERMAL | Status: DC
  Administered 2012-11-24 – 2012-11-25 (×2): 14 mg via TRANSDERMAL
  Filled 2012-11-23 (×5): qty 1

## 2012-11-23 MED ORDER — NABUMETONE 500 MG PO TABS: 500.0000 mg | ORAL_TABLET | Freq: Two times a day (BID) | ORAL | Status: DC | PRN

## 2012-11-23 MED ORDER — ACETAMINOPHEN 325 MG PO TABS
650.0000 mg | ORAL_TABLET | Freq: Four times a day (QID) | ORAL | Status: DC | PRN
  Administered 2012-11-24 (×2): 650 mg via ORAL

## 2012-11-23 MED ORDER — CITALOPRAM HYDROBROMIDE 20 MG PO TABS
20.0000 mg | ORAL_TABLET | Freq: Every day | ORAL | Status: DC
  Administered 2012-11-24: 20 mg via ORAL
  Filled 2012-11-23 (×5): qty 1

## 2012-11-23 MED ORDER — MAGNESIUM HYDROXIDE 400 MG/5ML PO SUSP: 30.0000 mL | Freq: Every evening | ORAL | Status: DC | PRN

## 2012-11-23 MED ORDER — METHOCARBAMOL 500 MG PO TABS: 500.0000 mg | ORAL_TABLET | Freq: Three times a day (TID) | ORAL | Status: DC | PRN

## 2012-11-23 MED ORDER — ASPIRIN 325 MG PO TABS
325.0000 mg | ORAL_TABLET | Freq: Every day | ORAL | Status: DC
  Administered 2012-11-24 – 2012-11-29 (×4): 325 mg via ORAL
  Filled 2012-11-23 (×9): qty 1

## 2012-11-23 NOTE — Tx Team (Signed)
Initial Interdisciplinary Treatment Plan  PATIENT STRENGTHS: (choose at least two) Ability for insight Average or above average intelligence  PATIENT STRESSORS: Financial difficulties Health problems Substance abuse   PROBLEM LIST: Problem List/Patient Goals Date to be addressed Date deferred Reason deferred Estimated date of resolution  Depressed 11/23/12     Alcohol 11/23/12     Goal"get my meds right" 11/23/12                                          DISCHARGE CRITERIA:  Adequate post-discharge living arrangements Improved stabilization in mood, thinking, and/or behavior Medical problems require only outpatient monitoring Motivation to continue treatment in a less acute level of care Need for constant or close observation no longer present Reduction of life-threatening or endangering symptoms to within safe limits Verbal commitment to aftercare and medication compliance Withdrawal symptoms are absent or subacute and managed without 24-hour nursing intervention  PRELIMINARY DISCHARGE PLAN: Attend 12-step recovery group Return to previous living arrangement  PATIENT/FAMIILY INVOLVEMENT: This treatment plan has been presented to and reviewed with the patient, Lucas Small, and/or family member,  The patient and family have been given the opportunity to ask questions and make suggestions.  Altonio Schwertner Dawkins 11/23/2012, 4:44 PM

## 2012-11-23 NOTE — Progress Notes (Signed)
Patient did attend the evening speaker AA meeting.  

## 2012-11-23 NOTE — Progress Notes (Signed)
[facility] MD Progress Note  11/23/2012 10:14 AM Lucas Small  MRN:  [id number] Subjective:  Patient was admitted yesterday but is still waiting for bed.  He still reports feeling suicidal and cannot contract for safety.  He reports good sleep and appetite.  He still feels hopeless and helpless and states " I do not know what I will do If you let me go".  He will be transffering to our inpatient unit as soon as bed is available.  He denies HI/AVH. Diagnosis:   Axis I: Major Depression, Recurrent severe and Substance Abuse Axis II: Deferred Axis III:  Past Medical History  Diagnosis Date  . Depression   . Alcoholic   . Hypertension   . Renal disorder   . Respiratory failure   . CHF (congestive heart failure)   . Perforation bowel   . PNA (pneumonia)   . MRSA (methicillin resistant Staphylococcus aureus)   . Renal failure    Axis IV: economic problems, housing problems, other psychosocial or environmental problems and problems related to social environment Axis V: 51-60 moderate symptoms  ADL's:  Intact  Sleep: Good  Appetite:  Good  Suicidal Ideation:  Plan:  none Intent:  yes Means:  none Homicidal Ideation:  Plan:  none Intent:  none Means:  none AEB (as evidenced by):  Psychiatric Specialty Exam: Review of Systems  Constitutional: Positive for malaise/fatigue (Reports still feeling tired occassionally.). Negative for fever, chills, weight loss and diaphoresis.  HENT: Negative.  Negative for hearing loss, ear pain, nosebleeds, congestion, sore throat, neck pain, tinnitus and ear discharge.   Eyes: Negative.  Negative for blurred vision, double vision, photophobia, pain, discharge and redness.  Respiratory: Negative.  Negative for cough, hemoptysis, sputum production, shortness of breath, wheezing and stridor.   Cardiovascular: Negative.  Negative for chest pain, palpitations, orthopnea, claudication, leg swelling and PND.  Gastrointestinal: Negative.  Negative for  heartburn, nausea, vomiting, abdominal pain, diarrhea, constipation, blood in stool and melena.  Genitourinary: Negative.  Negative for dysuria, urgency, frequency, hematuria and flank pain.  Musculoskeletal: Negative for myalgias, back pain, joint pain and falls.       Reports occasional back and joint pain  Skin: Negative for itching and rash.       Reports less itching after bedbug and scabies treatment.  Neurological: Negative for dizziness, tingling, tremors, sensory change, speech change, focal weakness, seizures, weakness and headaches.  Endo/Heme/Allergies: Negative for environmental allergies and polydipsia. Does not bruise/bleed easily.  Psychiatric/Behavioral: Positive for depression (Reports depression 8/10), suicidal ideas Photographer for safety ) and substance abuse (Hx of alcohol and cocaine and marijuana abuse). Negative for hallucinations and memory loss. The patient is nervous/anxious (Rates anxiety 7/10). The patient does not have insomnia.     Blood pressure 118/60, pulse 60, temperature 97.8 F (36.6 C), temperature source Oral, resp. rate 18, SpO2 98.00%.There is no weight on file to calculate BMI.  General Appearance: Casual  Eye Contact::  Good  Speech:  Clear and Coherent  Volume:  Decreased  Mood:  Anxious, Depressed and Hopeless  Affect:  Depressed and Flat  Thought Process:  Coherent and Intact  Orientation:  Full (Time, Place, and Person)  Thought Content:  WDL  Suicidal Thoughts:  Yes.  without intent/plan  Homicidal Thoughts:  No  Memory:  Immediate;   Good Recent;   Good Remote;   Good  Judgement:  Good  Insight:  Shallow  Psychomotor Activity:  Normal  Concentration:  Good  Recall:  Fair  Akathisia:  NA  Handed:  Right  AIMS (if indicated):     Assets:  Desire for Improvement  Sleep:      Current Medications: Current Facility-Administered Medications  Medication Dose Route Frequency Provider Last Rate Last Dose  . acetaminophen (TYLENOL)  tablet 650 mg  650 mg Oral Q4H PRN Junius Finner, PA-C      . acetaminophen (TYLENOL) tablet 650 mg  650 mg Oral Q4H PRN Junius Finner, PA-C      . alum & mag hydroxide-simeth (MAALOX/MYLANTA) 200-200-20 MG/5ML suspension 30 mL  30 mL Oral PRN Junius Finner, PA-C   30 mL at 11/21/12 1829  . HYDROcodone-acetaminophen (NORCO/VICODIN) 5-325 MG per tablet 1 tablet  1 tablet Oral Q6H PRN Junius Finner, PA-C   1 tablet at 11/22/12 2217  . ibuprofen (ADVIL,MOTRIN) tablet 600 mg  600 mg Oral Q8H PRN Junius Finner, PA-C      . LORazepam (ATIVAN) tablet 1 mg  1 mg Oral Q8H PRN Junius Finner, PA-C   1 mg at 11/22/12 2217  . nicotine (NICODERM CQ - dosed in mg/24 hours) patch 21 mg  21 mg Transdermal Once Gwyneth Sprout, MD   21 mg at 11/22/12 2217  . ondansetron (ZOFRAN) tablet 4 mg  4 mg Oral Q8H PRN Junius Finner, PA-C   4 mg at 11/22/12 0142  . zolpidem (AMBIEN) tablet 5 mg  5 mg Oral QHS PRN Junius Finner, PA-C   5 mg at 11/22/12 2217   Current Outpatient Prescriptions  Medication Sig Dispense Refill  . acetaminophen (TYLENOL) 325 MG tablet Take 650 mg by mouth every 4 (four) hours as needed for pain.      Marland Kitchen aspirin 325 MG tablet Take 1 tablet (325 mg total) by mouth daily.      . citalopram (CELEXA) 20 MG tablet Take 1 tablet (20 mg total) by mouth daily.      Marland Kitchen diltiazem (DILACOR XR) 180 MG 24 hr capsule Take 1 capsule (180 mg total) by mouth daily.      . fentaNYL (DURAGESIC - DOSED MCG/HR) 25 MCG/HR Place 1 patch (25 mcg total) onto the skin every 3 (three) days.  5 patch  0  . HYDROcodone-acetaminophen (NORCO/VICODIN) 5-325 MG per tablet Take 1 tablet by mouth every 6 (six) hours as needed for pain.  30 tablet  0  . lamoTRIgine (LAMICTAL) 25 MG tablet Take 5 tablets (125 mg total) by mouth daily. For mood stability.  150 tablet  0  . metoprolol succinate (TOPROL-XL) 100 MG 24 hr tablet Take 100 mg by mouth daily. Take with or immediately following a meal.      . nicotine (NICODERM CQ - DOSED IN  MG/24 HOURS) 14 mg/24hr patch Place 1 patch onto the skin daily.  28 patch  0  . polyethylene glycol (MIRALAX / GLYCOLAX) packet Take 17 g by mouth 2 (two) times daily.  14 each  0  . traZODone (DESYREL) 150 MG tablet Take 1 tablet (150 mg total) by mouth at bedtime as needed. For sleep  30 tablet  0   Facility-Administered Medications Ordered in Other Encounters  Medication Dose Route Frequency Provider Last Rate Last Dose  . alum & mag hydroxide-simeth (MAALOX/MYLANTA) 200-200-20 MG/5ML suspension 30 mL  30 mL Oral Q4H PRN Shuvon Rankin, NP      . folic acid (FOLVITE) tablet 1 mg  1 mg Oral Daily Shuvon Rankin, NP      . haloperidol (HALDOL) tablet 5 mg  5 mg Oral Q6H PRN Shuvon Rankin, NP      . LORazepam (ATIVAN) tablet 1 mg  1 mg Oral Q8H PRN Shuvon Rankin, NP      . magnesium hydroxide (MILK OF MAGNESIA) suspension 30 mL  30 mL Oral Daily PRN Shuvon Rankin, NP      . multivitamin with minerals tablet 1 tablet  1 tablet Oral Daily Shuvon Rankin, NP        Lab Results:  Results for orders placed during the hospital encounter of 11/21/12 (from the past 48 hour(s))  CBC WITH DIFFERENTIAL     Status: Abnormal   Collection Time    11/21/12  4:44 PM      Result Value Range   WBC 11.3 (*) 4.0 - 10.5 K/uL   RBC 5.25  4.22 - 5.81 MIL/uL   Hemoglobin 17.5 (*) 13.0 - 17.0 g/dL   HCT 19.1  47.8 - 29.5 %   MCV 93.1  78.0 - 100.0 fL   MCH 33.3  26.0 - 34.0 pg   MCHC 35.8  30.0 - 36.0 g/dL   RDW 62.1 (*) 30.8 - 65.7 %   Platelets 365  150 - 400 K/uL   Neutrophils Relative % 77  43 - 77 %   Neutro Abs 8.6 (*) 1.7 - 7.7 K/uL   Lymphocytes Relative 16  12 - 46 %   Lymphs Abs 1.8  0.7 - 4.0 K/uL   Monocytes Relative 5  3 - 12 %   Monocytes Absolute 0.6  0.1 - 1.0 K/uL   Eosinophils Relative 2  0 - 5 %   Eosinophils Absolute 0.2  0.0 - 0.7 K/uL   Basophils Relative 1  0 - 1 %   Basophils Absolute 0.1  0.0 - 0.1 K/uL  COMPREHENSIVE METABOLIC PANEL     Status: Abnormal   Collection Time     11/21/12  4:44 PM      Result Value Range   Sodium 134 (*) 135 - 145 mEq/L   Potassium 3.4 (*) 3.5 - 5.1 mEq/L   Chloride 95 (*) 96 - 112 mEq/L   CO2 19  19 - 32 mEq/L   Glucose, Bld 98  70 - 99 mg/dL   BUN 9  6 - 23 mg/dL   Creatinine, Ser 8.46  0.50 - 1.35 mg/dL   Calcium 96.2  8.4 - 95.2 mg/dL   Total Protein 7.9  6.0 - 8.3 g/dL   Albumin 3.8  3.5 - 5.2 g/dL   AST 20  0 - 37 U/L   ALT 13  0 - 53 U/L   Alkaline Phosphatase 204 (*) 39 - 117 U/L   Total Bilirubin 0.3  0.3 - 1.2 mg/dL   GFR calc non Af Amer >90  >90 mL/min   GFR calc Af Amer >90  >90 mL/min   Comment:            The eGFR has been calculated     using the CKD EPI equation.     This calculation has not been     validated in all clinical     situations.     eGFR's persistently     <90 mL/min signify     possible Chronic Kidney Disease.  ETHANOL     Status: Abnormal   Collection Time    11/21/12  4:44 PM      Result Value Range   Alcohol, Ethyl (B) 118 (*) 0 - 11 mg/dL  Comment:            LOWEST DETECTABLE LIMIT FOR     SERUM ALCOHOL IS 11 mg/dL     FOR MEDICAL PURPOSES ONLY  ACETAMINOPHEN LEVEL     Status: None   Collection Time    11/21/12  4:44 PM      Result Value Range   Acetaminophen (Tylenol), Serum <15.0  10 - 30 ug/mL   Comment:            THERAPEUTIC CONCENTRATIONS VARY     SIGNIFICANTLY. A RANGE OF 10-30     ug/mL MAY BE AN EFFECTIVE     CONCENTRATION FOR MANY PATIENTS.     HOWEVER, SOME ARE BEST TREATED     AT CONCENTRATIONS OUTSIDE THIS     RANGE.     ACETAMINOPHEN CONCENTRATIONS     >150 ug/mL AT 4 HOURS AFTER     INGESTION AND >50 ug/mL AT 12     HOURS AFTER INGESTION ARE     OFTEN ASSOCIATED WITH TOXIC     REACTIONS.  SALICYLATE LEVEL     Status: Abnormal   Collection Time    11/21/12  4:44 PM      Result Value Range   Salicylate Lvl <2.0 (*) 2.8 - 20.0 mg/dL  URINE RAPID DRUG SCREEN (HOSP PERFORMED)     Status: None   Collection Time    11/21/12  6:22 PM      Result Value  Range   Opiates NONE DETECTED  NONE DETECTED   Cocaine NONE DETECTED  NONE DETECTED   Benzodiazepines NONE DETECTED  NONE DETECTED   Amphetamines NONE DETECTED  NONE DETECTED   Tetrahydrocannabinol NONE DETECTED  NONE DETECTED   Barbiturates NONE DETECTED  NONE DETECTED   Comment:            DRUG SCREEN FOR MEDICAL PURPOSES     ONLY.  IF CONFIRMATION IS NEEDED     FOR ANY PURPOSE, NOTIFY LAB     WITHIN 5 DAYS.                LOWEST DETECTABLE LIMITS     FOR URINE DRUG SCREEN     Drug Class       Cutoff (ng/mL)     Amphetamine      1000     Barbiturate      200     Benzodiazepine   200     Tricyclics       300     Opiates          300     Cocaine          300     THC              50  URINALYSIS, ROUTINE W REFLEX MICROSCOPIC     Status: Abnormal   Collection Time    11/21/12  6:22 PM      Result Value Range   Color, Urine YELLOW  YELLOW   APPearance CLEAR  CLEAR   Specific Gravity, Urine 1.016  1.005 - 1.030   pH 6.0  5.0 - 8.0   Glucose, UA NEGATIVE  NEGATIVE mg/dL   Hgb urine dipstick SMALL (*) NEGATIVE   Bilirubin Urine NEGATIVE  NEGATIVE   Ketones, ur NEGATIVE  NEGATIVE mg/dL   Protein, ur 30 (*) NEGATIVE mg/dL   Urobilinogen, UA 0.2  0.0 - 1.0 mg/dL   Nitrite NEGATIVE  NEGATIVE   Leukocytes, UA  NEGATIVE  NEGATIVE  URINE MICROSCOPIC-ADD ON     Status: Abnormal   Collection Time    11/21/12  6:22 PM      Result Value Range   Squamous Epithelial / LPF FEW (*) RARE   WBC, UA 0-2  <3 WBC/hpf   RBC / HPF 3-6  <3 RBC/hpf   Bacteria, UA FEW (*) RARE   Urine-Other MUCOUS PRESENT    GLUCOSE, CAPILLARY     Status: Abnormal   Collection Time    11/21/12  8:13 PM      Result Value Range   Glucose-Capillary 100 (*) 70 - 99 mg/dL    Physical Findings: AIMS:  , ,  ,  ,    CIWA:    COWS:     Treatment Plan Summary: Daily contact with patient to assess and evaluate symptoms and progress in treatment Medication management  Plan:  Patient will be admitted once bed is  ready for him.  Medical Decision Making Problem Points:  Established problem, stable/improving (1) Data Points:  Review and summation of old records (2)  I certify that inpatient services furnished can reasonably be expected to improve the patient's condition.   Dahlia Byes, C  PMHNP-BC 11/23/2012, 10:14 AM

## 2012-11-23 NOTE — ED Notes (Signed)
Transferred to [facility] via hospital security and mental health tech. Ambulatory without difficulty. No complaints voiced.

## 2012-11-23 NOTE — ED Notes (Signed)
Report called to Airline pilot at [facility]. Accepted by Dr.Lugo to room 304-2.

## 2012-11-23 NOTE — Progress Notes (Signed)
Voluntary admission for a 47 y.o. Male with flat affect, depressed mood.   Pt. Was at this facility approximately 2 weeks ago.  Stressors are financial and health issues.   Reports drinking 6 beers daily and that he is here to "get my meds right".  Pt. Denies SI/HI and denies A/V hallucinations and contracts for safety.  Pt. Has multiple red spots all over his body which he reports as flea bites.  Pt. Oriented to unit

## 2012-11-24 ENCOUNTER — Encounter (HOSPITAL_COMMUNITY): Payer: Self-pay | Admitting: Psychiatry

## 2012-11-24 DIAGNOSIS — F1010 Alcohol abuse, uncomplicated: Secondary | ICD-10-CM

## 2012-11-24 DIAGNOSIS — F332 Major depressive disorder, recurrent severe without psychotic features: Principal | ICD-10-CM

## 2012-11-24 DIAGNOSIS — F339 Major depressive disorder, recurrent, unspecified: Secondary | ICD-10-CM | POA: Diagnosis present

## 2012-11-24 DIAGNOSIS — F411 Generalized anxiety disorder: Secondary | ICD-10-CM

## 2012-11-24 NOTE — Progress Notes (Signed)
Psychoeducational Group Note  Date:  11/24/2012 Time:  1100  Group Topic/Focus:  Wellness Toolbox:   The focus of this group is to discuss various aspects of wellness, balancing those aspects and exploring ways to increase the ability to experience wellness.  Patients will create a wellness toolbox for use upon discharge.  Participation Level: Did Not Attend  Participation Quality:  Not Applicable  Affect:  Not Applicable  Cognitive:  Not Applicable  Insight:  Not Applicable  Engagement in Group: Not Applicable  Additional Comments:  Pt did not attend group.  Sharyn Lull 11/24/2012, 11:51 AM

## 2012-11-24 NOTE — [facility] Group Notes (Signed)
[facility] LCSW Group Therapy  11/24/2012 2:50 PM  Type of Therapy:  Group Therapy  Participation Level:  Active  Participation Quality:  Attentive  Affect:  Appropriate  Cognitive:  Appropriate  Insight:  Engaged  Engagement in Therapy:  Engaged  Modes of Intervention:  Confrontation, Discussion, Education, Exploration, Socialization and Support  Summary of Progress/Problems: Today's Topic: Overcoming Obstacles. Pt identified obstacles faced currently and processed barriers involved in overcoming these obstacles. Pt identified steps necessary for overcoming these obstacles and explored motivation (internal and external) for facing these difficulties head on. Pt further identified one area of concern in their lives and chose a skill of focus pulled from their "toolbox." Lucas Small identified easy access to alcohol, managing pain, and "fixing personal problems with his significant other" as potential obstacles anticipated upon discharge from [facility]. He processed how dealing with his mental health issues/chronic pain/constant SI are imperative for overcoming these obstacles. Lucas Small processed with other group members how to develop specific coping skills and stay away from "negative people" in order to be successful in overcoming these obstacles.    Smart, Lucas Small 11/24/2012, 2:50 PM

## 2012-11-24 NOTE — Progress Notes (Signed)
Patient ID: Lucas Small, male   DOB: Jun 20, 1965, 47 y.o.   MRN: [id number] D)  Has been out to the dayroom and also attended group this evening, came to med window and was asking appropriate questions.  Disheveled in appearance, wearing paper scrubs, makes good eye contact. Arms were noted to be covered with red bumps, appeared to be insect bites, pt stated his GF had cats and they had fleas, had bites all over, doing a little better now.  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  Requested something for sleep and his anxiety, was given trazadone, stated was hoping for some ativan.  Other meds scheduled to start in am.   A)  Will continue to monitor for safety, continue POC R)  Safety maintained at this time.

## 2012-11-24 NOTE — Progress Notes (Signed)
Patient ID: Lucas Small, male   DOB: 11/24/1965, 47 y.o.   MRN: [id number] D: pt. Lying in bed, reports pain at "8" of 10. Pt. Says he had thoughts of getting better. Pt. Ileostomy bag intact draining loose greenish stool. Pt. Contracts for safety. A: Writer introduced self to client and encouraged group. Staff will monitor q87min for safety. R: Pt. Is safe on the unit. Pt. Went to group late.

## 2012-11-24 NOTE — [facility] Suicide Risk Assessment (Signed)
Suicide Risk Assessment  Admission Assessment     Nursing information obtained from:  Patient Demographic factors:  Male Current Mental Status:   (denies SI/HI and harm at present) Loss Factors:  Decline in physical health;Financial problems / change in socioeconomic status Historical Factors:  Prior suicide attempts Risk Reduction Factors:  Living with another person, especially a relative (lives with girl friend intermittently)  CLINICAL FACTORS:   Severe Anxiety and/or Agitation Depression:   Comorbid alcohol abuse/dependence Insomnia  COGNITIVE FEATURES THAT CONTRIBUTE TO RISK:  Closed-mindedness Polarized thinking Thought constriction (tunnel vision)    SUICIDE RISK:   Moderate:  Frequent suicidal ideation with limited intensity, and duration, some specificity in terms of plans, no associated intent, good self-control, limited dysphoria/symptomatology, some risk factors present, and identifiable protective factors, including available and accessible social support.  PLAN OF CARE: Supportive approach/coping skills/relapse prevention                               Reassess and address the co morbidities  I certify that inpatient services furnished can reasonably be expected to improve the patient's condition.  Willona Phariss A 11/24/2012, 4:52 PM

## 2012-11-24 NOTE — [facility] Group Notes (Signed)
[facility] LCSW Aftercare Discharge Planning Group Note   11/24/2012 9:41 AM  Participation Quality:  DID NOT ATTEND   Smart, Lucas Small
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
## 2012-11-24 NOTE — Tx Team (Signed)
Interdisciplinary Treatment Plan Update (Adult)  Date: 11/24/2012   Time Reviewed: 11:53 AM  Progress in Treatment:  Attending groups: no.  Participating in groups:  No.  Taking medication as prescribed: Yes  Tolerating medication: Yes  Family/Significant othe contact made: Not yet. SPE required for this pt.  Patient understands diagnosis: Yes, AEB seeking treatment for SI/HI, substance abuse/attempted overdose, and mood stabilization.  Discussing patient identified problems/goals with staff: Yes  Medical problems stabilized or resolved: Yes  Denies suicidal/homicidal ideation: Yes  Patient has not harmed self or Others: Yes  New problem(s) identified: n/a  Discharge Plan or Barriers: Pt has several medical issues and will require medical followup. CSW currently exploring other aftercare options with pt.  Additional comments: Lucas Small is an 47 y.o. male. Pt presents voluntarily to [facility] endorsing SI and HI. Assessment had to be postponed as pt was thought to have bedbugs, scabies. At time of assessment, pt endorses SI and is unable to contract for safety. When asked if pt has plan to commit suicide, pt says, "I've thought of doing it different ways" and won't elaborate. Pt denies prior suicide attempts. He denies HI and no delusions noted. Pt says he sometimes sees shadows moving out of the corner on his eyes. Writer asks pt re: ED notes that pt reported he "took pills" and that EMS disputed pt's claim. Pt says that he took pills while in the ED but doesn't know which of his pills he ingested. He then says, "I'm not sure I was trying to kill myself". Pt won't answer followup questions re: possible ingestion. Pt reports moderate anxiety. Current stressors include scheduled surgery to reverse ileostomy and distress due to loss of sexual function. He reports increased depressive symptoms including insomnia, worthlessness, and hopelessness. His affect is depressed. Pt was d/c from [facility] 11/11/22  for depression. He was also admitted to [facility] in March and June 2013. Pt reports drinking approx. 6 beers on 11/20/12.  Reason for Continuation of Hospitalization: SI Mood stabilization Medication management  Estimated length of stay: 3-5 days  For review of initial/current patient goals, please see plan of care.  Attendees:  Patient:    Family:    Physician: Geoffery Lyons MD  11/24/2012 11:57 AM   Nursing: Maureen Ralphs RN  11/24/2012 11:57 AM   Clinical Social Worker Annlee Glandon Smart, LCSWA  11/24/2012 11:57 AM   Other: Lupita Leash RN  11/24/2012 11:57 AM   Other:    Other:    Other:    Scribe for Treatment Team:  Trula Slade LCSWA 11/24/2012 11:57 AM

## 2012-11-24 NOTE — ED Provider Notes (Signed)
Medical screening examination/treatment/procedure(s) were performed by non-physician practitioner and as supervising physician I was immediately available for consultation/collaboration.  Flint Melter, MD 11/24/12 [phone]

## 2012-11-24 NOTE — Progress Notes (Signed)
Pt rated his sleep as poor even after he requested something for sleep appetite energy low and ability to pay attention as improving. He rated his depression 8 hopelessness 6 and anxiety a 8 on his self-inventory.  He denies any S/H ideation or A/V hallucinations.  He did c/o dizziness so he pulled his copy of fall precautions. We dicussed how important it is for him to change positions slowly before just jumping up.  He voiced understanding and he demonstrate  how to changes slowly.
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
## 2012-11-25 DIAGNOSIS — F329 Major depressive disorder, single episode, unspecified: Secondary | ICD-10-CM

## 2012-11-25 MED ORDER — HYDROXYZINE HCL 25 MG PO TABS: 25.0000 mg | ORAL_TABLET | Freq: Four times a day (QID) | ORAL | Status: DC | PRN

## 2012-11-25 NOTE — Progress Notes (Signed)
Psychoeducational Group Note  Date:  11/25/2012 Time:  1100  Group Topic/Focus:  Recovery Goals:   The focus of this group is to identify appropriate goals for recovery and establish a plan to achieve them.  Participation Level: Did Not Attend  Participation Quality:  Not Applicable  Affect:  Not Applicable  Cognitive:  Not Applicable  Insight:  Not Applicable  Engagement in Group: Not Applicable  Additional Comments:  Pt remained resting in bed during this group.   Sharyn Lull 11/25/2012, 11:59 AM

## 2012-11-25 NOTE — BHH Group Notes (Signed)
[facility] LCSW Aftercare Discharge Planning Group Note   11/25/2012 10:17 AM  Participation Quality:  DID NOT ATTEND   Smart, Lucas Small

## 2012-11-25 NOTE — Progress Notes (Signed)
Recreation Therapy Notes  Date: 07.01.2014        Time: 3:00pm Location: [address] Dayroom      Group Topic/Focus: Self Expression  Participation Level: Active  Participation Quality: Appropriate and Sharing  Affect: Euthymic  Cognitive: Appropriate   Additional Comments: Activity: My road; Explanation: Patients were given construction paper and pencils and were asked to draw the road they have walked through life.   Patient actively participated in group activity. Patient chose not to share drawing with group. Patient listened to peers discuss the road they have walked on. Patient shared drawing with LRT following group session, patient drawing had a long winding road surrounded by mountains and a volcano. Patient did not explain the periods of his life represented.   Marykay Lex Ark Agrusa, LRT/CTRS  Jearl Klinefelter 11/25/2012 4:28 PM

## 2012-11-25 NOTE — Progress Notes (Signed)
[facility] MD Progress Note  11/25/2012 4:59 PM Lucas Small  MRN:  [id number] Subjective:  Has been hesitant to take medications. Wants to be placed back on the medications he was taking  last time he was at the [facility] system in [place]. He does not remember what he was taking. Stated he is still very suicidal. Can contract for safety. Very vague, somatically focused, ruminative. Diagnosis:  Major Depression, Anxiety Disorder NOS  ADL's:  Intact  Sleep: Poor  Appetite:  Poor  Suicidal Ideation:  Plan:  denies Intent:  denies Means:  denies Homicidal Ideation:  Plan:  denies Intent:  denies Means:  denies AEB (as evidenced by):  Psychiatric Specialty Exam: Review of Systems  HENT: Negative.   Eyes: Negative.   Respiratory: Negative.   Cardiovascular: Negative.   Gastrointestinal: Positive for abdominal pain.  Genitourinary: Negative.   Musculoskeletal: Positive for back pain.  Skin: Negative.   Neurological: Positive for dizziness and weakness.  Endo/Heme/Allergies: Negative.   Psychiatric/Behavioral: Positive for depression and suicidal ideas. The patient is nervous/anxious and has insomnia.     Blood pressure 106/75, pulse 108, temperature 97.6 F (36.4 C), temperature source Oral, resp. rate 16, height 5\' 8"  (1.727 m), weight 66.679 kg (147 lb).Body mass index is 22.36 kg/(m^2).  General Appearance: Fairly Groomed  Patent attorney::  Fair  Speech:  Clear and Coherent, Slow and monoloque type  Volume:  Decreased  Mood:  Anxious, Depressed and worried, irritated  Affect:  anxious, worried  Thought Process:  Coherent and Goal Directed,   Orientation:  Full (Time, Place, and Person)  Thought Content:  Rumination and somatically/medication focused  Suicidal Thoughts:  Yes.  without intent/plan  Homicidal Thoughts:  No  Memory:  Immediate;   Fair Recent;   Fair Remote;   Fair  Judgement:  Fair  Insight:  superficial  Psychomotor Activity:  Restlessness  Concentration:  Fair   Recall:  Poor  Akathisia:  No  Handed:  Right  AIMS (if indicated):     Assets:  Desire for Improvement  Sleep:  Number of Hours: 6.5   Current Medications: Current Facility-Administered Medications  Medication Dose Route Frequency Provider Last Rate Last Dose  . acetaminophen (TYLENOL) tablet 650 mg  650 mg Oral Q6H PRN Nanine Means, NP   650 mg at 11/24/12 2137  . alum & mag hydroxide-simeth (MAALOX/MYLANTA) 200-200-20 MG/5ML suspension 30 mL  30 mL Oral Q6H PRN Nanine Means, NP      . aspirin tablet 325 mg  325 mg Oral Daily Nanine Means, NP   325 mg at 11/24/12 0818  . citalopram (CELEXA) tablet 20 mg  20 mg Oral Daily Nanine Means, NP   20 mg at 11/24/12 0820  . diltiazem (CARDIZEM CD) 24 hr capsule 180 mg  180 mg Oral Daily Nanine Means, NP   180 mg at 11/24/12 0818  . lamoTRIgine (LAMICTAL) tablet 125 mg  125 mg Oral Daily Nanine Means, NP   125 mg at 11/24/12 0819  . magnesium hydroxide (MILK OF MAGNESIA) suspension 30 mL  30 mL Oral QHS PRN Nanine Means, NP      . methocarbamol (ROBAXIN) tablet 500 mg  500 mg Oral TID PRN Nanine Means, NP      . nabumetone (RELAFEN) tablet 500 mg  500 mg Oral BID PRN Nanine Means, NP      . nicotine (NICODERM CQ - dosed in mg/24 hours) patch 14 mg  14 mg Transdermal Q0600 Nanine Means, NP  14 mg at 11/25/12 0658  . traZODone (DESYREL) tablet 150 mg  150 mg Oral QHS PRN Nanine Means, NP   150 mg at 11/24/12 2137   Facility-Administered Medications Ordered in Other Encounters  Medication Dose Route Frequency Provider Last Rate Last Dose  . folic acid (FOLVITE) tablet 1 mg  1 mg Oral Daily Shuvon Rankin, NP      . haloperidol (HALDOL) tablet 5 mg  5 mg Oral Q6H PRN Shuvon Rankin, NP      . LORazepam (ATIVAN) tablet 1 mg  1 mg Oral Q8H PRN Shuvon Rankin, NP      . magnesium hydroxide (MILK OF MAGNESIA) suspension 30 mL  30 mL Oral Daily PRN Shuvon Rankin, NP      . multivitamin with minerals tablet 1 tablet  1 tablet Oral Daily Shuvon Rankin, NP         Lab Results: No results found for this or any previous visit (from the past 48 hour(s)).  Physical Findings: AIMS: Facial and Oral Movements Muscles of Facial Expression: None, normal Lips and Perioral Area: None, normal Jaw: None, normal Tongue: None, normal,Extremity Movements Upper (arms, wrists, hands, fingers): None, normal Lower (legs, knees, ankles, toes): None, normal, Trunk Movements Neck, shoulders, hips: None, normal, Overall Severity Incapacitation due to abnormal movements: None, normal Patient's awareness of abnormal movements (rate only patient's report): No Awareness, Dental Status Current problems with teeth and/or dentures?: No Does patient usually wear dentures?: No  CIWA:  CIWA-Ar Total: 2 COWS:     Treatment Plan Summary: Daily contact with patient to assess and evaluate symptoms and progress in treatment Medication management  Plan: Supportive approach/coping skills           Will find out what mediations he was taking before           Reassess and address the co morbidities  Medical Decision Making Problem Points:  Review of psycho-social stressors (1) Data Points:  Review of medication regiment & side effects (2) Review of new medications or change in dosage (2)  I certify that inpatient services furnished can reasonably be expected to improve the patient's condition.   Ralyn Stlaurent A 11/25/2012, 4:59 PM

## 2012-11-25 NOTE — Progress Notes (Addendum)
Recreation Therapy Notes  Date: 07.01.2014 Time: 2:30am Location: [address] Dayroom        Group Topic/Focus: Animal Assist Activities/Therapy (AAA/T)  Participation Level: Active  Participation Quality: Appropriate and Attentive  Affect: Euthymic  Cognitive: Appropriate  Additional Comments: 07.01.2014 Session = AAA Session ; Dog Team = [place] and handler  Patient pet and visited with [place]. Patient smiled while interacting with [place]. Patient asked appropriate questions about [place] and his training. Patient interacted with peers, LRT and dog team appropriately.   Cyndy Braver L Kathelene Rumberger, LRT/CTRS  Morgin Halls L 11/25/2012 4:49 PM 

## 2012-11-25 NOTE — B[facility] Group Notes (Signed)
[facility] LCSW Group Therapy  11/25/2012 2:35 PM  Type of Therapy:  Group Therapy  Participation Level:  Active  Participation Quality:  Attentive  Affect:  Appropriate  Cognitive:  Appropriate  Insight:  Engaged  Engagement in Therapy:  Engaged  Modes of Intervention:  Discussion, Education, Exploration, Socialization and Support  Summary of Progress/Problems:[facility] Speaker did not come today. CSW passed out informational pamphlets pertaining to [facility] and [facility]. CSW thoroughly explained programs and services. Group members were invited to asked questions and discuss any experiences. 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 Lucas Small expressed interest in several groups offered by [facility] and asked questions about times/locations. He also stated that he would like to go to the [facility] in order to receive help in obtaining and ID. Lucas Small was able to process how knowing about available resources within the community can assist him with staying clean/sober.    Smart, Lucas Small 11/25/2012, 2:35 PM

## 2012-11-25 NOTE — Progress Notes (Signed)
D:  Patient stayed in his room this morning.  Would not attend groups and refused all medications.  States he thought the doctor was making some medication changes and that he was not taking any of his prescribed medicines unless there had been changes.  When informed that the medications were the same as yesterday patient stated that the best thing for him to do was to stay in his room alone.  He also stated that he was more depressed today and having more thoughts of suicide than he ever had.  He was able to agree to seek out staff if he felt unsafe.  He stated if he could be "left alone," for a while he would be fine.  He did stay in his room until lunch time and then got up to take a shower and change his bag.  Since that time he has been up and active in the milieu.  He has been smiling and joking some with staff and peers this afternoon and is currently in the gym with the group.  A:  Encouraged patient to consider taking his medications.  MD was notified.  Spoke with patient at length.  It was agreed that he would not have to go to groups today unless his mood improved to allow him to interact.  He was given Gatorade and his lunch tray was brought back to the unit.  Since he has been up this afternoon and interacting with staff and peers he will be encouraged to go to the dining room for dinner.  R:  Irritable this morning, but more cooperative as the day has progressed.  Safety is maintained.

## 2012-11-25 NOTE — Progress Notes (Signed)
Patient ID: Lucas Small, male   DOB: [birth year], 47 y.o.   MRN: [id number]  D:  Pt +ve SI, but contracts for safety.  Pt denies HI/AVH. Pt is pleasant and cooperative. Pt was very irritable upon approach, but did change his mood and calm down after talking. Pt is upset about medication regimen, and complains of pain that will subside when he calms down, pt could not give a number on the pain level. Pt was better during the night , sitting in the dayroom talking to pt's and other pt's.   A: Pt was offered support and encouragement. Pt was given scheduled medications. Pt was encourage to attend groups. Q 15 minute checks were done for safety.   R:Pt  interacts well with peers and staff. Pt is taking medication.Pt receptive to treatment and safety maintained on unit.

## 2012-11-26 DIAGNOSIS — F339 Major depressive disorder, recurrent, unspecified: Secondary | ICD-10-CM

## 2012-11-26 DIAGNOSIS — F1020 Alcohol dependence, uncomplicated: Secondary | ICD-10-CM

## 2012-11-26 MED ORDER — NICOTINE 21 MG/24HR TD PT24
21.0000 mg | MEDICATED_PATCH | Freq: Every day | TRANSDERMAL | Status: DC
  Administered 2012-11-26 – 2012-11-29 (×6): 21 mg via TRANSDERMAL
  Filled 2012-11-26 (×7): qty 1

## 2012-11-26 MED ORDER — DILTIAZEM HCL ER COATED BEADS 180 MG PO CP24
180.0000 mg | ORAL_CAPSULE | Freq: Once | ORAL | Status: AC
  Administered 2012-11-26: 180 mg via ORAL
  Filled 2012-11-26: qty 1

## 2012-11-26 MED ORDER — HYDROCODONE-ACETAMINOPHEN 5-325 MG PO TABS
1.0000 | ORAL_TABLET | ORAL | Status: DC | PRN
  Administered 2012-11-26 – 2012-11-29 (×6): 1 via ORAL
  Filled 2012-11-26 (×6): qty 1

## 2012-11-26 MED ORDER — ASPIRIN 325 MG PO TABS
325.0000 mg | ORAL_TABLET | Freq: Once | ORAL | Status: AC
  Administered 2012-11-26: 325 mg via ORAL
  Filled 2012-11-26: qty 1

## 2012-11-26 MED ORDER — DULOXETINE HCL 30 MG PO CPEP
30.0000 mg | ORAL_CAPSULE | Freq: Every day | ORAL | Status: DC
  Administered 2012-11-26 – 2012-11-29 (×4): 30 mg via ORAL
  Filled 2012-11-26: qty 1
  Filled 2012-11-26 (×2): qty 14
  Filled 2012-11-26 (×4): qty 1

## 2012-11-26 MED ORDER — GABAPENTIN 100 MG PO CAPS
100.0000 mg | ORAL_CAPSULE | Freq: Three times a day (TID) | ORAL | Status: DC
  Administered 2012-11-26 – 2012-11-29 (×9): 100 mg via ORAL
  Filled 2012-11-26 (×4): qty 1
  Filled 2012-11-26: qty 42
  Filled 2012-11-26 (×2): qty 1
  Filled 2012-11-26: qty 42
  Filled 2012-11-26 (×2): qty 1
  Filled 2012-11-26: qty 42
  Filled 2012-11-26 (×2): qty 1
  Filled 2012-11-26: qty 42
  Filled 2012-11-26 (×2): qty 1
  Filled 2012-11-26: qty 42
  Filled 2012-11-26: qty 1
  Filled 2012-11-26: qty 42

## 2012-11-26 MED ORDER — LAMOTRIGINE 100 MG PO TABS
125.0000 mg | ORAL_TABLET | Freq: Once | ORAL | Status: AC
  Administered 2012-11-26: 125 mg via ORAL
  Filled 2012-11-26: qty 1

## 2012-11-26 MED ORDER — FENTANYL 25 MCG/HR TD PT72
25.0000 ug | MEDICATED_PATCH | TRANSDERMAL | Status: DC
  Administered 2012-11-26 – 2012-11-29 (×2): 25 ug via TRANSDERMAL
  Filled 2012-11-26 (×2): qty 1

## 2012-11-26 NOTE — Progress Notes (Signed)
[facility] MD Progress Note  11/26/2012 1:37 PM Lucas Small  MRN:  [id number] Subjective:  [name] is back on the medications he was last prescribed by the surgeon. He thinks the Neurontin is very helpful for his neuropathy. He agreed to try Cymbalta in place of the Celexa/Lexapro. He still endorses pain, depression, anxiey and suicidal ideas Diagnosis:  Alcohol Dependence, Major Depression recurrent, Anxiety Disorder NOS  ADL's:  Intact  Sleep: Poor  Appetite:  Poor  Suicidal Ideation:  Plan:  thoughts Intent:  denies Means:  denies Homicidal Ideation:  Plan:  denies Intent:  denies Means:  denies AEB (as evidenced by):  Psychiatric Specialty Exam: Review of Systems  Constitutional: Negative.   HENT: Negative.   Eyes: Negative.   Respiratory: Negative.   Cardiovascular: Negative.   Gastrointestinal: Positive for abdominal pain.  Genitourinary: Negative.   Musculoskeletal: Positive for back pain.  Skin: Negative.   Neurological: Negative.   Endo/Heme/Allergies: Negative.   Psychiatric/Behavioral: Positive for depression and suicidal ideas. The patient is nervous/anxious and has insomnia.     Blood pressure 110/76, pulse 98, temperature 97.3 F (36.3 C), temperature source Oral, resp. rate 16, height 5\' 8"  (1.727 m), weight 66.679 kg (147 lb).Body mass index is 22.36 kg/(m^2).  General Appearance: Disheveled  Eye Solicitor::  Fair  Speech:  Clear and Coherent and Slow  Volume:  Decreased  Mood:  Anxious and Depressed  Affect:  anxious, depressed  Thought Process:  Coherent and Goal Directed  Orientation:  Full (Time, Place, and Person)  Thought Content:  worries, concerns, somatically focused  Suicidal Thoughts:  Yes.  without intent/plan  Homicidal Thoughts:  No  Memory:  Immediate;   Fair Recent;   Poor Remote;   Poor  Judgement:  Fair  Insight:  Present and superficial  Psychomotor Activity:  Restlessness  Concentration:  Fair  Recall:  Poor  Akathisia:  No   Handed:  Right  AIMS (if indicated):     Assets:  Desire for Improvement Housing  Sleep:  Number of Hours: 6.75   Current Medications: Current Facility-Administered Medications  Medication Dose Route Frequency Provider Last Rate Last Dose  . acetaminophen (TYLENOL) tablet 650 mg  650 mg Oral Q6H PRN Nanine Means, NP   650 mg at 11/24/12 2137  . alum & mag hydroxide-simeth (MAALOX/MYLANTA) 200-200-20 MG/5ML suspension 30 mL  30 mL Oral Q6H PRN Nanine Means, NP      . aspirin tablet 325 mg  325 mg Oral Daily Nanine Means, NP   325 mg at 11/24/12 0818  . diltiazem (CARDIZEM CD) 24 hr capsule 180 mg  180 mg Oral Daily Nanine Means, NP   180 mg at 11/24/12 0818  . DULoxetine (CYMBALTA) DR capsule 30 mg  30 mg Oral Daily Rachael Fee, MD   30 mg at 11/26/12 1142  . fentaNYL (DURAGESIC - dosed mcg/hr) patch 25 mcg  25 mcg Transdermal Q72H Rachael Fee, MD   25 mcg at 11/26/12 1135  . gabapentin (NEURONTIN) capsule 100 mg  100 mg Oral TID Rachael Fee, MD   100 mg at 11/26/12 1142  . HYDROcodone-acetaminophen (NORCO/VICODIN) 5-325 MG per tablet 1 tablet  1 tablet Oral Q4H PRN Rachael Fee, MD      . hydrOXYzine (ATARAX/VISTARIL) tablet 25 mg  25 mg Oral Q6H PRN Kerry Hough, PA-C      . lamoTRIgine (LAMICTAL) tablet 125 mg  125 mg Oral Daily Nanine Means, NP   125 mg at  11/24/12 0819  . magnesium hydroxide (MILK OF MAGNESIA) suspension 30 mL  30 mL Oral QHS PRN Nanine Means, NP      . methocarbamol (ROBAXIN) tablet 500 mg  500 mg Oral TID PRN Nanine Means, NP      . nabumetone (RELAFEN) tablet 500 mg  500 mg Oral BID PRN Nanine Means, NP      . nicotine (NICODERM CQ - dosed in mg/24 hours) patch 21 mg  21 mg Transdermal Daily Rachael Fee, MD   21 mg at 11/26/12 0700  . traZODone (DESYREL) tablet 150 mg  150 mg Oral QHS PRN Nanine Means, NP   150 mg at 11/24/12 2137   Facility-Administered Medications Ordered in Other Encounters  Medication Dose Route Frequency Provider Last Rate Last  Dose  . folic acid (FOLVITE) tablet 1 mg  1 mg Oral Daily Shuvon Rankin, NP      . haloperidol (HALDOL) tablet 5 mg  5 mg Oral Q6H PRN Shuvon Rankin, NP      . LORazepam (ATIVAN) tablet 1 mg  1 mg Oral Q8H PRN Shuvon Rankin, NP      . magnesium hydroxide (MILK OF MAGNESIA) suspension 30 mL  30 mL Oral Daily PRN Shuvon Rankin, NP      . multivitamin with minerals tablet 1 tablet  1 tablet Oral Daily Shuvon Rankin, NP        Lab Results: No results found for this or any previous visit (from the past 48 hour(s)).  Physical Findings: AIMS: Facial and Oral Movements Muscles of Facial Expression: None, normal Lips and Perioral Area: None, normal Jaw: None, normal Tongue: None, normal,Extremity Movements Upper (arms, wrists, hands, fingers): None, normal Lower (legs, knees, ankles, toes): None, normal, Trunk Movements Neck, shoulders, hips: None, normal, Overall Severity Incapacitation due to abnormal movements: None, normal Patient's awareness of abnormal movements (rate only patient's report): No Awareness, Dental Status Current problems with teeth and/or dentures?: No Does patient usually wear dentures?: No  CIWA:  CIWA-Ar Total: 2 COWS:     Treatment Plan Summary: Daily contact with patient to assess and evaluate symptoms and progress in treatment Medication management  Plan: Supportive approach/coping skills/relapse prevention           Resume Neurontin 100 mg TID           D/C Celexa           Trial with Cymbalta 30 mg daily           Pain Management: Duragesic 25 mg q 72 Hours/Hydrocodone 5/325 mg one q 6 hours PRN           pain  Medical Decision Making Problem Points:  Review of psycho-social stressors (1) Data Points:  Review of medication regiment & side effects (2) Review of new medications or change in dosage (2)  I certify that inpatient services furnished can reasonably be expected to improve the patient's condition.   Lucas Small A 11/26/2012, 1:37 PM

## 2012-11-26 NOTE — BHH Group Notes (Signed)
[facility] LCSW Aftercare Discharge Planning Group Note   11/26/2012 9:42 AM  Participation Quality:  Did not attend  Smart, Herbert Seta

## 2012-11-26 NOTE — BHH Group Notes (Signed)
[facility] LCSW Group Therapy  11/26/2012 2:37 PM  Type of Therapy:  Group Therapy  Participation Level:  Active  Participation Quality:  Attentive  Affect:  Appropriate  Cognitive:  Appropriate  Insight:  Engaged  Engagement in Therapy:  Engaged  Modes of Intervention:  Discussion, Education, Exploration, Socialization and Support  Summary of Progress/Problems: Emotion Regulation: This group focused on both positive and negative emotion identification and allowed group members to process ways to identify feelings, regulate negative emotions, and find healthy ways to manage internal/external emotions. Group members were asked to reflect on a time when their reaction to an emotion led to a negative outcome and explored how alternative responses using emotion regulation would have benefited them. Group members were also asked to discuss a time when emotion regulation was utilized when a negative emotion was experienced. Lucas Small stated that selfishness is an emotion that he struggles with often in addition to guilt and embarrassment. Lucas Small was able to process how being open with his doctors about what he is experiencing and communicating with loved ones in his support system are important to his ability to regulate emotions. Lucas Small stated that mental distractions also help him to cope with negative emotions and shared a personal experience using mental distractions to avoid "going off" on someone in the past.    Lucas Small, Lucas Small 11/26/2012, 2:37 PM

## 2012-11-26 NOTE — Progress Notes (Signed)
D: Patient denies SI/HI/AVH. Patient rates hopelessness as 7,  depression as 6, and anxiety as 4.  Patient affect is appropriate. Mood is depressed.  "I was dizzy because I've been off my medications for a few days.  I just got my medications restarted back to where they used to be.  I feel better."  Patient did attend evening group. Patient visible on the milieu. No distress noted. A: Support and encouragement offered. Scheduled medications given to pt. Q 15 min checks continued for patient safety. R: Patient receptive. Patient remains safe on the unit.

## 2012-11-26 NOTE — Progress Notes (Signed)
Adult Psychoeducational Group Note  Date:  11/26/2012 Time:  1:25 PM  Group Topic/Focus:  Personal Choices and Values:   The focus of this group is to help patients assess and explore the importance of values in their lives, how their values affect their decisions, how they express their values and what opposes their expression.  Participation Level:  Active  Participation Quality:  Appropriate  Affect:  engaged   Cognitive:  Appropriate  Insight: Good  Engagement in Group:  Engaged, Supportive and he provided advice to the others  Modes of Intervention:  Education, Exploration and Support  Additional Comments:  No additional comments  Margy Clarks 11/26/2012, 1:25 PM

## 2012-11-26 NOTE — Progress Notes (Deleted)
Adult Psychoeducational Group Note  Date:  11/26/2012 Time:  11:00 AM  Group Topic/Focus:  Personal Choices and Values:   The focus of this group is to help patients assess and explore the importance of values in their lives, how their values affect their decisions, how they express their values and what opposes their expression.  Participation Level:  Active  Participation Quality:  Attentive  Affect:  Appropriate  Cognitive:  Appropriate  Insight: Appropriate  Engagement in Group:  Engaged  Modes of Intervention:  Discussion and Support  Additional Comments:  Pt was active during group and shared that some values that were important to him in the past have changed over time. Pt also discussed the hope to rebuild values pertaining to family and relationships.  Malachy Moan 11/26/2012, 1:22 PM

## 2012-11-26 NOTE — Progress Notes (Signed)
Adult Psychoeducational Group Note  Date:  11/26/2012 Time:  10:01 PM  Group Topic/Focus:  Goals Group:   The focus of this group is to help patients establish daily goals to achieve during treatment and discuss how the patient can incorporate goal setting into their daily lives to aide in recovery.  Participation Level:  Active  Participation Quality:  Appropriate  Affect:  Appropriate  Cognitive:  Appropriate  Insight: Appropriate  Engagement in Group:  Engaged  Modes of Intervention:  Discussion  Additional Comments:  Pt. Attend group  Aldona Lento 11/26/2012, 10:01 PM

## 2012-11-27 NOTE — Progress Notes (Signed)
Patient did attend the evening karaoke group. Pt was engaged, supportive, and participated by singing two songs.   

## 2012-11-27 NOTE — Progress Notes (Signed)
D:  Jerri reports that he slept ok and his appetite is good.  He rates his energy level is high and his attention level is improving.  He rates depression at 4/10 and hopelessness at 3/10.  He reports off/on suicidal ideation, but contracts for safety.  He continues to use a wheelchair to move around and stated that he felt more comfortable using the chair.  Encouraged him to walk as much as possible. A:  Medications administered as ordered.  Safety checks q 15 minutes.  Emotional support provided. R:  Safety maintained on unit.

## 2012-11-27 NOTE — Progress Notes (Signed)
Chaplain provided support with pt on 300 hallway and in gym during recreation.   Pt expressed uncertainty around decision to re unite with ex wife.  Detailed history of wife leaving relationship while pt in hospice and then rehabilitation.   Pt's family does not wish for him to re-unite with ex-wife.  Spoke with chaplain about history of surgeries, changes in body, feeling unlike himself and feeling in limbo / unsure what condition he will be in after surgeries are completed.   Chaplain provided empathic presence and emotional support.    Belva Crome MDiv

## 2012-11-27 NOTE — [facility] Group Notes (Signed)
[facility] LCSW Aftercare Discharge Planning Group Note   11/27/2012 9:50 AM  Participation Quality:  Appropriate   Mood/Affect:  Appropriate  Depression Rating:  4  Anxiety Rating:  6  Thoughts of Suicide:  No Will you contract for safety?   NA  Current AVH:  No  Plan for Discharge/Comments:  Pt stated that his wife is coming to visit today and that he hopes they will get back together. Pt planning to return home with his wife and follow up at [facility] for med management. Pt offered therapy service/ACT referral. Pt declined and stated that he did not want any further services. He feels that once his medications are managed, he will "be fine." Pt stated that he "got a reset on meds" and feels good so far.   Transportation Means: wife/unknown   Supports: wife  Counselling psychologist, [place]

## 2012-11-27 NOTE — Progress Notes (Signed)
Recreation Therapy Notes  Date: 07.02.2014 Time: 3:00pm Location: 300 Hall Dayroom      Group Topic/Focus: Teamwork, Communication, Problem Solving  Participation Level: Active  Participation Quality: Appropriate  Affect: Euthymic  Cognitive: Appropriate   Additional Comments: Activity: Cup Selina Cooley ; Explanation: Patients were asked to build a pyramid using 10 plastic cups and ribbon tied to 2 rubber bands. Patients were instructed not to touch the cups with their hands. Patients completed activities in two rounds.   Patient participated in the first round of activity. Patient with peers successful at building a pyramid. Patient contributed to wrap up discussion about the importance of using problem solving, communication and team work skills post d/c. Patient recognized the benefit communication and team work had on completing the group activity. Patient was able to identify differences in both rounds and how communication effected each round of the group activity.   Marykay Lex Terrisha Lopata, LRT/CTRS  Keren Alverio L 11/27/2012 4:20 PM

## 2012-11-27 NOTE — Progress Notes (Signed)
Patient ID: Lucas Small, male   DOB: September 10, 1965, 47 y.o.   MRN: [id number] [facility] MD Progress Note  11/27/2012 1:17 PM Lucas Small  MRN:  [id number]  Subjective: Lucas Small reports today that he still trying to adjust to his treatment regimen. Says he is feeling stronger some, and yet apprehensive about where to live after discharge. Ruminated about passed surgeries, and more surgeries to be done. Wondering about the effects of the upcoming surgeries continue to keep his anxiety on a high level. He rated his depression at #5 and anxiety at #6 currently. He uses wheelchair to aid his mobility within the unit.   Diagnosis:  Major Depression, Anxiety Disorder NOS  ADL's:  Intact  Sleep: Poor  Appetite:  Poor  Suicidal Ideation:  Plan:  denies Intent:  denies Means:  denies Homicidal Ideation:  Plan:  denies Intent:  denies Means:  denies  AEB (as evidenced by):  Psychiatric Specialty Exam: Review of Systems  HENT: Negative.   Eyes: Negative.   Respiratory: Negative.   Cardiovascular: Negative.   Gastrointestinal: Positive for abdominal pain.  Genitourinary: Negative.   Musculoskeletal: Positive for back pain.  Skin: Negative.   Neurological: Positive for dizziness and weakness.  Endo/Heme/Allergies: Negative.   Psychiatric/Behavioral: Positive for depression and suicidal ideas. The patient is nervous/anxious and has insomnia.     Blood pressure 90/59, pulse 105, temperature 97.7 F (36.5 C), temperature source Oral, resp. rate 16, height 5\' 8"  (1.727 m), weight 66.679 kg (147 lb).Body mass index is 22.36 kg/(m^2).  General Appearance: Fairly Groomed  Patent attorney::  Fair  Speech:  Clear and Coherent, Slow and monoloque type  Volume:  Decreased  Mood:  Anxious, Depressed and worried, irritated  Affect:  anxious, worried  Thought Process:  Coherent and Goal Directed,   Orientation:  Full (Time, Place, and Person)  Thought Content:  Rumination and somatically/medication  focused  Suicidal Thoughts:  Yes.  without intent/plan  Homicidal Thoughts:  No  Memory:  Immediate;   Fair Recent;   Fair Remote;   Fair  Judgement:  Fair  Insight:  superficial  Psychomotor Activity:  Restlessness  Concentration:  Fair  Recall:  Poor  Akathisia:  No  Handed:  Right  AIMS (if indicated):     Assets:  Desire for Improvement  Sleep:  Number of Hours: 4   Current Medications: Current Facility-Administered Medications  Medication Dose Route Frequency Provider Last Rate Last Dose  . acetaminophen (TYLENOL) tablet 650 mg  650 mg Oral Q6H PRN Nanine Means, NP   650 mg at 11/24/12 2137  . alum & mag hydroxide-simeth (MAALOX/MYLANTA) 200-200-20 MG/5ML suspension 30 mL  30 mL Oral Q6H PRN Nanine Means, NP      . aspirin tablet 325 mg  325 mg Oral Daily Nanine Means, NP   325 mg at 11/27/12 0759  . diltiazem (CARDIZEM CD) 24 hr capsule 180 mg  180 mg Oral Daily Nanine Means, NP   180 mg at 11/27/12 0759  . DULoxetine (CYMBALTA) DR capsule 30 mg  30 mg Oral Daily Rachael Fee, MD   30 mg at 11/27/12 0759  . fentaNYL (DURAGESIC - dosed mcg/hr) patch 25 mcg  25 mcg Transdermal Q72H Rachael Fee, MD   25 mcg at 11/26/12 1135  . gabapentin (NEURONTIN) capsule 100 mg  100 mg Oral TID Rachael Fee, MD   100 mg at 11/27/12 1141  . HYDROcodone-acetaminophen (NORCO/VICODIN) 5-325 MG per tablet 1 tablet  1 tablet  Oral Q4H PRN Rachael Fee, MD   1 tablet at 11/26/12 2241  . hydrOXYzine (ATARAX/VISTARIL) tablet 25 mg  25 mg Oral Q6H PRN Kerry Hough, PA-C      . lamoTRIgine (LAMICTAL) tablet 125 mg  125 mg Oral Daily Nanine Means, NP   125 mg at 11/27/12 0759  . magnesium hydroxide (MILK OF MAGNESIA) suspension 30 mL  30 mL Oral QHS PRN Nanine Means, NP      . methocarbamol (ROBAXIN) tablet 500 mg  500 mg Oral TID PRN Nanine Means, NP      . nabumetone (RELAFEN) tablet 500 mg  500 mg Oral BID PRN Nanine Means, NP      . nicotine (NICODERM CQ - dosed in mg/24 hours) patch 21 mg  21  mg Transdermal Daily Rachael Fee, MD   21 mg at 11/27/12 0759  . traZODone (DESYREL) tablet 150 mg  150 mg Oral QHS PRN Nanine Means, NP   100 mg at 11/26/12 2241   Facility-Administered Medications Ordered in Other Encounters  Medication Dose Route Frequency Provider Last Rate Last Dose  . folic acid (FOLVITE) tablet 1 mg  1 mg Oral Daily Shuvon Rankin, NP      . haloperidol (HALDOL) tablet 5 mg  5 mg Oral Q6H PRN Shuvon Rankin, NP      . LORazepam (ATIVAN) tablet 1 mg  1 mg Oral Q8H PRN Shuvon Rankin, NP      . magnesium hydroxide (MILK OF MAGNESIA) suspension 30 mL  30 mL Oral Daily PRN Shuvon Rankin, NP      . multivitamin with minerals tablet 1 tablet  1 tablet Oral Daily Shuvon Rankin, NP        Lab Results: No results found for this or any previous visit (from the past 48 hour(s)).  Physical Findings: AIMS: Facial and Oral Movements Muscles of Facial Expression: None, normal Lips and Perioral Area: None, normal Jaw: None, normal Tongue: None, normal,Extremity Movements Upper (arms, wrists, hands, fingers): None, normal Lower (legs, knees, ankles, toes): None, normal, Trunk Movements Neck, shoulders, hips: None, normal, Overall Severity Incapacitation due to abnormal movements: None, normal Patient's awareness of abnormal movements (rate only patient's report): No Awareness, Dental Status Current problems with teeth and/or dentures?: No Does patient usually wear dentures?: No  CIWA:  CIWA-Ar Total: 2 COWS:     Treatment Plan Summary: Daily contact with patient to assess and evaluate symptoms and progress in treatment Medication management  Plan: Supportive approach/coping skills/relapse prevention. Encouraged out of room, participation in group sessions and application of coping skills when distressed. Will continue to monitor response to/adverse effects of medications in use to assure effectiveness. Continue to monitor mood, behavior and interaction with staff and other  patients. Continue current plan of care.  Medical Decision Making Problem Points:  Review of psycho-social stressors (1) Data Points:  Review of medication regiment & side effects (2) Review of new medications or change in dosage (2)  I certify that inpatient services furnished can reasonably be expected to improve the patient's condition.   Armandina Stammer I, PMHNP, FNP-BC 11/27/2012, 1:17 PM

## 2012-11-28 NOTE — Progress Notes (Signed)
D:  Lucas Small reports that he slept ok and that his appetite is good.  He is rating depression at 7/10 and hopelessness at 6/10.  He reports that he feels better physically, but emotionally he is having a difficult day.  He states that his wife visited yesterday and that he will not be going back to live with her and he is back to not knowing where he is going after discharge.  He denies any SI/HI/AVH at this time.  He is attending groups and is interacting appropriately with staff and other patients.  He is no longer using a wheelchair to get around. A:  Medications administered as ordered.  Safety checks q 15 minutes.  Emotional support provided. R:  Safety maintained on unit.

## 2012-11-28 NOTE — B[facility] Suicide Risk Assessment (Signed)
[facility] INPATIENT:  Family/Significant Other Suicide Prevention Education  Suicide Prevention Education:  Patient Refusal for Family/Significant Other Suicide Prevention Education: The patient Lucas Small has refused to provide written consent for family/significant other to be provided Family/Significant Other Suicide Prevention Education during admission and/or prior to discharge.  Physician notified.  Pt provided with SPE and encouraged to ask question/voice concerns. He was given SPI pamphlet including Mobile Crisis and other emergency numbers.   Smart, [name] 11/28/2012, 11:03 AM

## 2012-11-28 NOTE — Discharge Planning (Signed)
Pt reported to CSW that his exwife is no longer willing to allow him to come to her home. He requested to stay additional days in order to figure out if his ex girlfriend will take him back. CSW informed pt that he cannot stay here due to issues in finding permanent place to stay or to work out relationship problems. Per dr. Dub Mikes and Aggie PA, pt still  scheduled for d/c Saturday. Pt will need cab voucher because he cannot carry his bags due to medical condition (made out to wherever patient decides to go--more than likely ex girlfriend's home). Unable to get cab voucher today 7/4.

## 2012-11-28 NOTE — Progress Notes (Signed)
Adult Psychoeducational Group Note  Date:  11/28/2012 Time:  8:00PM Group Topic/Focus:  Wrap-Up Group:   The focus of this group is to help patients review their daily goal of treatment and discuss progress on daily workbooks.  Participation Level:  Did Not Attend   Additional Comments: Pt. Didn't attend group.   Bing Plume D 11/28/2012, 8:13 PM

## 2012-11-28 NOTE — Tx Team (Signed)
Interdisciplinary Treatment Plan Update (Adult)  Date: 11/28/2012   Time Reviewed: 10:56 AM  Progress in Treatment:  Attending groups: Yes  Participating in groups:  Yes Taking medication as prescribed: Yes  Tolerating medication: Yes  Family/Significant othe contact made: SPE completed with pt. Refused to consent to family contact.  Patient understands diagnosis: Yes, AEB seeking treatment for SI/HI, substance abuse/attempted overdose, and mood stabilization.  Discussing patient identified problems/goals with staff: Yes  Medical problems stabilized or resolved: Yes  Denies suicidal/homicidal ideation: Yes  Patient has not harmed self or Others: Yes  New problem(s) identified: n/a  Discharge Plan or Barriers: Pt stated that he will go home to live with his ex wife and will follow up at [facility] for med management. He has turned down other services (therapy, ACT, etc). Pt states he feels that his medication regimen is improving his mood and depression.  Additional comments: Pt uncertain about living situation. He wants to return to his wife's home but states that he may stay in a hotel (has disability check to pay for this) for a few days until they work things out. He has rejected any other offers for services or connections to resources other than [facility] and is hoping to discharge Sat.  Reason for Continuation of Hospitalization: Medication management  Estimated length of stay: 1 day For review of initial/current patient goals, please see plan of care.  Attendees:  Patient:    Family:    Physician: Geoffery Lyons MD  11/28/2012 10:56 AM   Nursing:   Clinical Social Worker [employer], LCSWA  11/28/2012 10:56 AM   Other:    Other:    Other:    Other:    Scribe for Treatment Team:  Trula Slade LCSWA 11/28/2012 10:56 AM

## 2012-11-28 NOTE — Progress Notes (Signed)
Pt observed resting in bed with eyes closed. RR WNL, even and unlabored. Level III obs in place for safety and pt remains safe. Lucas Small  

## 2012-11-28 NOTE — Progress Notes (Signed)
Patient ID: Lucas Small, male   DOB: [birth year], 47 y.o.   MRN: [id number] Patient ID: Lucas Small, male   DOB: 03/19/1966, 46 y.o.   MRN: [id number] [facility] MD Progress Note  11/28/2012 4:33 PM Lucas Small  MRN:  [id number]  Subjective: Lucas Small reports that he feeling fairly well. He says his mood is a little down. Still endorsing a lot of anxiety about his living situation. However, does have a plan in place to allow family members to call him on daily basis to assure that he is doing well. This is if he has to live in a motel after discharge by himself as he does not want to live with his ex-girlfriend and or his wife. He adds that he is still adjusting to the changes made on his medications. Is ambulating without the aid of a wheel chair today. He is endorsing passive SI without any plans and or intention to hurt himself.  Diagnosis:  Major Depression, Anxiety Disorder NOS  ADL's:  Intact  Sleep: Poor  Appetite:  Poor  Suicidal Ideation:  Plan:  denies Intent:  denies Means:  denies Homicidal Ideation:  Plan:  denies Intent:  denies Means:  denies  AEB (as evidenced by):  Psychiatric Specialty Exam: Review of Systems  HENT: Negative.   Eyes: Negative.   Respiratory: Negative.   Cardiovascular: Negative.   Gastrointestinal: Positive for abdominal pain.  Genitourinary: Negative.   Musculoskeletal: Positive for back pain.  Skin: Negative.   Neurological: Positive for dizziness and weakness.  Endo/Heme/Allergies: Negative.   Psychiatric/Behavioral: Positive for depression and suicidal ideas. The patient is nervous/anxious and has insomnia.     Blood pressure 108/74, pulse 108, temperature 97.7 F (36.5 C), temperature source Oral, resp. rate 16, height 5\' 8"  (1.727 m), weight 66.679 kg (147 lb).Body mass index is 22.36 kg/(m^2).  General Appearance: Fairly Groomed  Patent attorney::  Fair  Speech:  Clear and Coherent, Slow and monoloque type  Volume:  Decreased  Mood:   Anxious, Depressed and worried, irritated  Affect:  anxious, worried  Thought Process:  Coherent and Goal Directed,   Orientation:  Full (Time, Place, and Person)  Thought Content:  Rumination and somatically/medication focused  Suicidal Thoughts:  Yes.  without intent/plan  Homicidal Thoughts:  No  Memory:  Immediate;   Fair Recent;   Fair Remote;   Fair  Judgement:  Fair  Insight:  superficial  Psychomotor Activity:  Restlessness  Concentration:  Fair  Recall:  Poor  Akathisia:  No  Handed:  Right  AIMS (if indicated):     Assets:  Desire for Improvement  Sleep:  Number of Hours: 5.75   Current Medications: Current Facility-Administered Medications  Medication Dose Route Frequency Provider Last Rate Last Dose  . acetaminophen (TYLENOL) tablet 650 mg  650 mg Oral Q6H PRN Nanine Means, NP   650 mg at 11/24/12 2137  . alum & mag hydroxide-simeth (MAALOX/MYLANTA) 200-200-20 MG/5ML suspension 30 mL  30 mL Oral Q6H PRN Nanine Means, NP      . aspirin tablet 325 mg  325 mg Oral Daily Nanine Means, NP   325 mg at 11/28/12 0758  . diltiazem (CARDIZEM CD) 24 hr capsule 180 mg  180 mg Oral Daily Nanine Means, NP   180 mg at 11/28/12 0759  . DULoxetine (CYMBALTA) DR capsule 30 mg  30 mg Oral Daily Rachael Fee, MD   30 mg at 11/28/12 0759  . fentaNYL (DURAGESIC - dosed mcg/hr) patch  25 mcg  25 mcg Transdermal Q72H Rachael Fee, MD   25 mcg at 11/26/12 1135  . gabapentin (NEURONTIN) capsule 100 mg  100 mg Oral TID Rachael Fee, MD   100 mg at 11/28/12 1253  . HYDROcodone-acetaminophen (NORCO/VICODIN) 5-325 MG per tablet 1 tablet  1 tablet Oral Q4H PRN Rachael Fee, MD   1 tablet at 11/28/12 1523  . hydrOXYzine (ATARAX/VISTARIL) tablet 25 mg  25 mg Oral Q6H PRN Kerry Hough, PA-C      . lamoTRIgine (LAMICTAL) tablet 125 mg  125 mg Oral Daily Nanine Means, NP   125 mg at 11/28/12 0758  . magnesium hydroxide (MILK OF MAGNESIA) suspension 30 mL  30 mL Oral QHS PRN Nanine Means, NP      .  methocarbamol (ROBAXIN) tablet 500 mg  500 mg Oral TID PRN Nanine Means, NP      . nabumetone (RELAFEN) tablet 500 mg  500 mg Oral BID PRN Nanine Means, NP      . nicotine (NICODERM CQ - dosed in mg/24 hours) patch 21 mg  21 mg Transdermal Daily Rachael Fee, MD   21 mg at 11/28/12 0757  . traZODone (DESYREL) tablet 150 mg  150 mg Oral QHS PRN Nanine Means, NP   100 mg at 11/27/12 2310   Facility-Administered Medications Ordered in Other Encounters  Medication Dose Route Frequency Provider Last Rate Last Dose  . folic acid (FOLVITE) tablet 1 mg  1 mg Oral Daily Shuvon Rankin, NP      . haloperidol (HALDOL) tablet 5 mg  5 mg Oral Q6H PRN Shuvon Rankin, NP      . LORazepam (ATIVAN) tablet 1 mg  1 mg Oral Q8H PRN Shuvon Rankin, NP      . magnesium hydroxide (MILK OF MAGNESIA) suspension 30 mL  30 mL Oral Daily PRN Shuvon Rankin, NP      . multivitamin with minerals tablet 1 tablet  1 tablet Oral Daily Shuvon Rankin, NP        Lab Results: No results found for this or any previous visit (from the past 48 hour(s)).  Physical Findings: AIMS: Facial and Oral Movements Muscles of Facial Expression: None, normal Lips and Perioral Area: None, normal Jaw: None, normal Tongue: None, normal,Extremity Movements Upper (arms, wrists, hands, fingers): None, normal Lower (legs, knees, ankles, toes): None, normal, Trunk Movements Neck, shoulders, hips: None, normal, Overall Severity Incapacitation due to abnormal movements: None, normal Patient's awareness of abnormal movements (rate only patient's report): No Awareness, Dental Status Current problems with teeth and/or dentures?: No Does patient usually wear dentures?: No  CIWA:  CIWA-Ar Total: 2 COWS:     Treatment Plan Summary: Daily contact with patient to assess and evaluate symptoms and progress in treatment Medication management  Plan: Supportive approach/coping skills/relapse prevention. Encouraged out of room, participation in group  sessions and application of coping skills when distressed. Will continue to monitor response to/adverse effects of medications in use to assure effectiveness. Continue to monitor mood, behavior and interaction with staff and other patients. Continue current plan of care.  Medical Decision Making Problem Points:  Review of psycho-social stressors (1) Data Points:  Review of medication regiment & side effects (2) Review of new medications or change in dosage (2)  I certify that inpatient services furnished can reasonably be expected to improve the patient's condition.   Lucas Small I, PMHNP, FNP-BC 11/28/2012, 4:33 PM

## 2012-11-28 NOTE — Progress Notes (Signed)
[facility] Adult Case Management Discharge Plan :  Will you be returning to the same living situation after discharge: No. At discharge, do you have transportation home?:Yes,  ex wife Do you have the ability to pay for your medications:Yes,  mental health  Release of information consent forms completed and in the chart;  Patient's signature needed at discharge.  Patient to Follow up at: Follow-up Information   Follow up with [facility]. (Walk in between 8am-9am Monday through Friday for hospital followup/medication management. )    Contact information:   [address] [place], [place] [address] phone: [phone] fax: [phone]      Patient denies SI/HI:   Yes,  in group/self report    Safety Planning and Suicide Prevention discussed:  Yes,  spe completed with pt due to pt refusal to consent to family contact.   Smart, Shakeia Krus 11/28/2012, 11:04 AM

## 2012-11-29 MED ORDER — DILTIAZEM HCL ER 180 MG PO CP24: 180.0000 mg | ORAL_CAPSULE | Freq: Every day | ORAL | Status: DC

## 2012-11-29 MED ORDER — FENTANYL 25 MCG/HR TD PT72: 1.0000 | MEDICATED_PATCH | TRANSDERMAL | Status: DC

## 2012-11-29 MED ORDER — POLYETHYLENE GLYCOL 3350 17 G PO PACK: 17.0000 g | PACK | Freq: Two times a day (BID) | ORAL | Status: DC

## 2012-11-29 MED ORDER — DULOXETINE HCL 30 MG PO CPEP: 30.0000 mg | ORAL_CAPSULE | Freq: Every day | ORAL | Status: DC

## 2012-11-29 MED ORDER — GABAPENTIN 100 MG PO CAPS: 100.0000 mg | ORAL_CAPSULE | Freq: Three times a day (TID) | ORAL | Status: DC

## 2012-11-29 MED ORDER — ASPIRIN 325 MG PO TABS: 325.0000 mg | ORAL_TABLET | Freq: Every day | ORAL | Status: DC

## 2012-11-29 MED ORDER — HYDROCODONE-ACETAMINOPHEN 5-325 MG PO TABS: 1.0000 | ORAL_TABLET | Freq: Four times a day (QID) | ORAL | Status: DC | PRN

## 2012-11-29 MED ORDER — LAMOTRIGINE 25 MG PO TABS: 125.0000 mg | ORAL_TABLET | Freq: Every day | ORAL | Status: DC

## 2012-11-29 MED ORDER — NICOTINE 21 MG/24HR TD PT24: 1.0000 | MEDICATED_PATCH | Freq: Every day | TRANSDERMAL | Status: DC

## 2012-11-29 MED ORDER — ACETAMINOPHEN 325 MG PO TABS: 650.0000 mg | ORAL_TABLET | ORAL | Status: DC | PRN

## 2012-11-29 MED ORDER — TRAZODONE HCL 150 MG PO TABS: 150.0000 mg | ORAL_TABLET | Freq: Every evening | ORAL | Status: DC | PRN

## 2012-11-29 NOTE — Progress Notes (Signed)
Psychoeducational Group Note  Date:  11/29/2012 Time:  0945 am  Group Topic/Focus:  Identifying Needs:   The focus of this group is to help patients identify their personal needs that have been historically problematic and identify healthy behaviors to address their needs.  Participation Level:  Did Not Attend  Andrena Mews 11/29/2012,9:45 AM

## 2012-11-29 NOTE — [facility] Suicide Risk Assessment (Signed)
Suicide Risk Assessment  Discharge Assessment     Demographic Factors:  Male  Mental Status Per Nursing Assessment::   On Admission:   (denies SI/HI and harm at present)  Current Mental Status by Physician: NA  Loss Factors: Financial problems/change in socioeconomic status  Historical Factors: Prior suicide attempts  Risk Reduction Factors:   Positive coping skills or problem solving skills  Continued Clinical Symptoms:  Alcohol/Substance Abuse/Dependencies  Cognitive Features That Contribute To Risk:  Closed-mindedness    Suicide Risk:  Minimal: No identifiable suicidal ideation.  Patients presenting with no risk factors but with morbid ruminations; may be classified as minimal risk based on the severity of the depressive symptoms  Discharge Diagnoses:   AXIS I:  Substance Abuse AXIS II:  Deferred AXIS III:   Past Medical History  Diagnosis Date  . Depression   . Alcoholic   . Hypertension   . Renal disorder   . Respiratory failure   . CHF (congestive heart failure)   . Perforation bowel   . PNA (pneumonia)   . MRSA (methicillin resistant Staphylococcus aureus)   . Renal failure    AXIS IV:  other psychosocial or environmental problems AXIS V:  51-60 moderate symptoms  Plan Of Care/Follow-up recommendations:  Activity:  as tolerated  Is patient on multiple antipsychotic therapies at discharge:  No   Has Patient had three or more failed trials of antipsychotic monotherapy by history:  No  Recommended Plan for Multiple Antipsychotic Therapies: Additional reason(s) for multiple antispychotic treatment:  n/a  Wonda Cerise 11/29/2012, 11:43 AM

## 2012-11-29 NOTE — B[facility] Group Notes (Signed)
[facility] Group Notes: (Clinical Social Work)   11/29/2012      Type of Therapy:  Group Therapy   Participation Level:  Did Not Attend    Ambrose Mantle, LCSW 11/29/2012, 12:24 PM

## 2012-11-29 NOTE — Progress Notes (Signed)
Adult Psychoeducational Group Note  Date:  11/29/2012 Time:  1300  Group Topic/Focus:  Making Healthy Choices:   The focus of this group is to help patients identify negative/unhealthy choices they were using prior to admission and identify positive/healthier coping strategies to replace them upon discharge. Relapse Prevention Planning:   The focus of this group is to define relapse and discuss the need for planning to combat relapse.  Participation Level:  Active  Participation Quality:  Intrusive, Monopolizing and Resistant  Affect:  Defensive, Irritable and Resistant  Cognitive:  Alert  Insight: Lacking  Engagement in Group:  Distracting, Monopolizing and Resistant  Modes of Intervention:  Discussion and Education  Additional Comments:  Pt was very negative, disruptive and resistant to discussing recovery goals. Writer informed pt that he did not have to stay if her did not want to focus on group goals, so he left group.  Torrey Ballinas Shari Prows 11/29/2012, 2:50 PM

## 2012-11-29 NOTE — Progress Notes (Signed)
Pt was discharged  Left with all follow up information and all belongings  Pt denies suicidal and homicidal ideation   Mood affect and behavior are appropriate and consistent for this pt His wife was to pick him up but if she doesn't show he has a cab voucher  Pt verbalized understanding of all follow up info and discharge instructions

## 2012-11-29 NOTE — Progress Notes (Signed)
Patient ID: Lucas Small, male   DOB: 04-28-1966, 47 y.o.   MRN: [id number] The patient spent most of the evening resting in bed with his eyes closed. He did not attend evening group. Stated that he was in pain. He described his pain as being mostly in his testicle related to his hernia. Medicated for pain.

## 2012-11-29 NOTE — Discharge Summary (Signed)
Physician Discharge Summary Note  Patient:  Lucas Small is an 47 y.o., male MRN:  [id number] DOB:  07-22-1965 Patient phone:  [phone] (home)  Patient address:   [address] [place] [address],   Date of Admission:  11/23/2012 Date of Discharge: 11/29/12  Reason for Admission: Suicidal ideations  Discharge Diagnoses: Principal Problem:   Major depression, recurrent Active Problems:   Alcohol dependency  Review of Systems  Constitutional: Negative.   Eyes: Negative.   Respiratory: Negative.   Cardiovascular: Negative.   Gastrointestinal: Negative.        Has ileostomy  Genitourinary: Negative.   Musculoskeletal: Negative.   Skin: Negative.   Neurological: Negative.   Endo/Heme/Allergies: Negative.   Psychiatric/Behavioral: Positive for depression (Stabilized with medication prior to discharge) and substance abuse (Alcohol dependence). Negative for suicidal ideas, hallucinations and memory loss. The patient is nervous/anxious (Stabilized with medication prior to discharge) and has insomnia (Stabilized with medication prior to discharge).    Axis Diagnosis:   AXIS I:  Alcohol dependence, Major depression, recurrent AXIS II:  Deferred AXIS III:   Past Medical History  Diagnosis Date  . Depression   . Alcoholic   . Hypertension   . Renal disorder   . Respiratory failure   . CHF (congestive heart failure)   . Perforation bowel   . PNA (pneumonia)   . MRSA (methicillin resistant Staphylococcus aureus)   . Renal failure    AXIS IV:  problems with primary support group and Substance dependence issues, chronic health problems AXIS V:  63  Level of Care:  OP  Hospital Course:  Was discharged from here couple of weeks ago. States that when he left last time he was feeling suicidal. States he was not feeling well. He went to a motel and then moved with his ex girlfriend. 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 This is a big problem for him. He has had multiple medical  issues and states he stays in pain. He got more depressed. He was drinking a six pack a day. Did not feel safe, came back for help.  While a patient in this hospital and after admission assessment/evaluation, it was determined that Lucas Small has major depressive disorder symptoms that will require medication management to stabilize. He was also noted to have significant/chronic health issues that may have in some form contributed to his mental state. He was then ordered and received Duloxetine 30 mg daily for depression, Lamictal 25 mg daily for mood stabilization, Gabapentin 100 mg tid for anxiety/pain control and Trazodone 150 mg Q bedtime for sleep. He was also enrolled in group counseling sessions and activities where he was counseled and learned coping skills that should help him maintain stability after discharge. Patient also received medication management and monitoring for his other medical issues and concerns. He tolerated his treatment regimen without any significant adverse effects and or reactions presented.  Patient did respond to his treatment regimen gradually on daily basis, although he has a lot of pain and other somatic complaints. This is evidenced by his daily reports of improved mood, reduction of symptoms and presentation of good affect/eye contact.  Patient attended treatment team meeting this am and met with the treatment team members. His reason for admission, present symptoms, treatment plans and response to treatment plans discussed. Patient endorsed that he is doing well and stable for discharge to pursue psychiatric care on outpatient basis. It was then agreed upon that he will continue psychiatric care at the [facility]  psychiatric clinic here in [place], [place]. Lucas Small has been informed and instructed that this is a walk-in appointment between the hours of 08:00 and 09:00 am, Monday thru Friday. The address dates, times and contact information for this clinic provided for patient  in writing.  Upon discharge, patient adamantly denies suicidal, homicidal ideations, auditory, visual hallucinations and or delusional thinking. He received from [facility] a 2 weeks worth supply samples of his [facility] discharge medications. He left [facility] with all personal belongings via family transport in no apparent distress.  Consults:  psychiatry  Significant Diagnostic Studies:  labs: CBC with diff, CMP, UDS, Toxicology tests, U/A  Discharge Vitals:   Blood pressure 98/69, pulse 100, temperature 97.4 F (36.3 C), temperature source Oral, resp. rate 18, height 5\' 8"  (1.727 m), weight 66.679 kg (147 lb). Body mass index is 22.36 kg/(m^2). Lab Results:   No results found for this or any previous visit (from the past 72 hour(s)).  Physical Findings: AIMS: Facial and Oral Movements Muscles of Facial Expression: None, normal Lips and Perioral Area: None, normal Jaw: None, normal Tongue: None, normal,Extremity Movements Upper (arms, wrists, hands, fingers): None, normal Lower (legs, knees, ankles, toes): None, normal, Trunk Movements Neck, shoulders, hips: None, normal, Overall Severity Incapacitation due to abnormal movements: None, normal Patient's awareness of abnormal movements (rate only patient's report): No Awareness, Dental Status Current problems with teeth and/or dentures?: No Does patient usually wear dentures?: No  CIWA:  CIWA-Ar Total: 2 COWS:     Psychiatric Specialty Exam: See Psychiatric Specialty Exam and Suicide Risk Assessment completed by Attending Physician prior to discharge.  Discharge destination:  Home  Is patient on multiple antipsychotic therapies at discharge:  No   Has Patient had three or more failed trials of antipsychotic monotherapy by history:  No  Recommended Plan for Multiple Antipsychotic Therapies: NA     Medication List    STOP taking these medications       citalopram 20 MG tablet  Commonly known as:  CELEXA     metoprolol succinate 100 MG  24 hr tablet  Commonly known as:  TOPROL-XL     nicotine 14 mg/24hr patch  Commonly known as:  NICODERM CQ - dosed in mg/24 hours  Replaced by:  nicotine 21 mg/24hr patch      TAKE these medications     Indication   acetaminophen 325 MG tablet  Commonly known as:  TYLENOL  Take 2 tablets (650 mg total) by mouth every 4 (four) hours as needed for pain.   Indication:  Pain     aspirin 325 MG tablet  Take 1 tablet (325 mg total) by mouth daily. For heart health   Indication:  Mild to Moderate Pain, Heart Attack, Heart health     diltiazem 180 MG 24 hr capsule  Commonly known as:  DILACOR XR  Take 1 capsule (180 mg total) by mouth daily. For control of high blood pressure   Indication:  High Blood Pressure     DULoxetine 30 MG capsule  Commonly known as:  CYMBALTA  Take 1 capsule (30 mg total) by mouth daily. For depression   Indication:  Generalized Anxiety Disorder, Major Depressive Disorder, Neuropathic Pain     fentaNYL 25 MCG/HR  Commonly known as:  DURAGESIC - dosed mcg/hr  Place 1 patch (25 mcg total) onto the skin every 3 (three) days. For severe pain   Indication:  Moderate to Severe Chronic Pain     gabapentin 100 MG capsule  Commonly known as:  NEURONTIN  Take 1 capsule (100 mg total) by mouth 3 (three) times daily. For anxiety/pain management   Indication:  Agitation, Anxiety/pain management     HYDROcodone-acetaminophen 5-325 MG per tablet  Commonly known as:  NORCO/VICODIN  Take 1 tablet by mouth every 6 (six) hours as needed for pain.   Indication:  Moderate to Moderately Severe Pain     lamoTRIgine 25 MG tablet  Commonly known as:  LAMICTAL  Take 5 tablets (125 mg total) by mouth daily. For mood stabilization   Indication:  Mood stabilization     nicotine 21 mg/24hr patch  Commonly known as:  NICODERM CQ - dosed in mg/24 hours  Place 1 patch onto the skin daily. For nicotine dependency   Indication:  Nicotine Addiction     polyethylene glycol packet   Commonly known as:  MIRALAX / GLYCOLAX  Take 17 g by mouth 2 (two) times daily. For constipation   Indication:  Constipation     traZODone 150 MG tablet  Commonly known as:  DESYREL  Take 1 tablet (150 mg total) by mouth at bedtime as needed. For sleep   Indication:  Trouble Sleeping       Follow-up Information   Follow up with [facility]. (Walk in between 8am-9am Monday through Friday for hospital followup/medication management. )    Contact information:   [address] [place], [place] [address] phone: [phone] fax: [phone]     Follow-up recommendations:  Activity:  As tolerated Diet: As recommended by your primary care doctor. Keep all scheduled follow-up appointments as recommended.  Continue to work the relapse prevention plan Comments:  Take all your medications as prescribed by your mental healthcare provider. Report any adverse effects and or reactions from your medicines to your outpatient provider promptly. Patient is instructed and cautioned to not engage in alcohol and or illegal drug use while on prescription medicines. In the event of worsening symptoms, patient is instructed to call the crisis hotline, 911 and or go to the nearest ED for appropriate evaluation and treatment of symptoms. Follow-up with your primary care provider for your other medical issues, concerns and or health care needs.   Total Discharge Time:  Greater than 30 minutes.  Signed: Sanjuana Kava, PMHNP, FNP-BC 12/01/2012, 10:14 AM Agree with assessment and plan Reymundo Poll. Dub Mikes, M.D.

## 2012-12-03 NOTE — Progress Notes (Signed)
Patient Discharge Instructions:  After Visit Summary (AVS):   Faxed to:  12/03/12 Discharge Summary Note:   Faxed to:  12/03/12 Psychiatric Admission Assessment Note:   Faxed to:  12/03/12 Suicide Risk Assessment - Discharge Assessment:   Faxed to:  12/03/12 Faxed/Sent to the Next Level Care provider:  12/03/12 Faxed to [facility] @ [phone]  Jerelene Redden, 12/03/2012, 3:44 PM

## 2012-12-25 ENCOUNTER — Other Ambulatory Visit (HOSPITAL_COMMUNITY): Payer: Self-pay | Admitting: Surgical Oncology

## 2012-12-25 DIAGNOSIS — K469 Unspecified abdominal hernia without obstruction or gangrene: Secondary | ICD-10-CM

## 2012-12-30 ENCOUNTER — Ambulatory Visit (HOSPITAL_COMMUNITY)
Admission: RE | Admit: 2012-12-30 | Discharge: 2012-12-30 | Disposition: A | Discharge status: Home / Self Care | Payer: Medicaid Other | Source: Ambulatory Visit | Attending: Surgical Oncology | Admitting: Surgical Oncology

## 2012-12-30 ENCOUNTER — Other Ambulatory Visit (HOSPITAL_COMMUNITY): Payer: Self-pay | Admitting: Surgical Oncology

## 2012-12-30 DIAGNOSIS — Z932 Ileostomy status: Secondary | ICD-10-CM | POA: Insufficient documentation

## 2012-12-30 DIAGNOSIS — K469 Unspecified abdominal hernia without obstruction or gangrene: Secondary | ICD-10-CM

## 2012-12-30 MED ORDER — IOHEXOL 300 MG/ML  SOLN
400.0000 mL | Freq: Once | INTRAMUSCULAR | Status: AC | PRN
  Administered 2012-12-30: 150 mL

## 2013-03-05 ENCOUNTER — Ambulatory Visit (INDEPENDENT_AMBULATORY_CARE_PROVIDER_SITE_OTHER): Payer: Medicaid Other | Admitting: Internal Medicine

## 2013-03-05 ENCOUNTER — Encounter: Payer: Self-pay | Admitting: Internal Medicine

## 2013-03-05 VITALS — BP 128/68 | HR 106 | Ht 69.0 in | Wt 166.4 lb

## 2013-03-05 DIAGNOSIS — A419 Sepsis, unspecified organism: Secondary | ICD-10-CM | POA: Insufficient documentation

## 2013-03-05 DIAGNOSIS — F17200 Nicotine dependence, unspecified, uncomplicated: Secondary | ICD-10-CM

## 2013-03-05 DIAGNOSIS — K5732 Diverticulitis of large intestine without perforation or abscess without bleeding: Secondary | ICD-10-CM

## 2013-03-05 DIAGNOSIS — F1994 Other psychoactive substance use, unspecified with psychoactive substance-induced mood disorder: Secondary | ICD-10-CM

## 2013-03-05 DIAGNOSIS — F339 Major depressive disorder, recurrent, unspecified: Secondary | ICD-10-CM

## 2013-03-05 DIAGNOSIS — R0609 Other forms of dyspnea: Secondary | ICD-10-CM | POA: Insufficient documentation

## 2013-03-05 DIAGNOSIS — K5792 Diverticulitis of intestine, part unspecified, without perforation or abscess without bleeding: Secondary | ICD-10-CM | POA: Insufficient documentation

## 2013-03-05 DIAGNOSIS — F1020 Alcohol dependence, uncomplicated: Secondary | ICD-10-CM

## 2013-03-05 NOTE — Patient Instructions (Signed)
Your physician recommends that you schedule a follow-up appointment in: 1 month with Dr.Hilty.  

## 2013-03-05 NOTE — Progress Notes (Signed)
OFFICE NOTE  Chief Complaint:  Dyspnea, "want to get my heart checked out"  Primary Care Physician: No primary provider on file.  HPI:  Lucas Small is a pleasant 47 year old [place] male, who was actually born in [place], and is accompanied by his mother today who is from [place]. He works as a Facilities manager in [place] and recently was seen at [facility] with abdominal pain. Ultimately he became very sick from what I can tell and went into septic shock. He was transferred to [facility] and underwent, according to him, 14 surgeries. This is left with significant open minimal scar which is healed by secondary intention. Prior to this his past medical history was significant for alcohol abuse. He reports that he's drinking about 4-6 beers a day, but has had problems with that in the past. He also carries some psychiatric diagnoses of bipolar and substance induced mood disorder. He is a daily smoker as well. Per his report, he was told that he may have some heart failure associated with his recent hospital admission, but I do not have those records currently to review. His chief complaints today are mild shortness of breath, but no chest pain. He has been drinking as recently as this morning. He also reports significant psychosocial problems including his wife who left him when he was in the hospital and sold all of his furniture.  Any typical chest pain, orthopnea, PND, lower extremity swelling, snoring or gasping for breath, cough, fever, chills or other associated symptoms.  PMHx:  Past Medical History  Diagnosis Date  . Depression   . Alcoholic   . Hypertension   . Renal disorder   . Respiratory failure   . CHF (congestive heart failure)   . Perforation bowel   . PNA (pneumonia)   . MRSA (methicillin resistant Staphylococcus aureus)   . Renal failure     Past Surgical History  Procedure Laterality Date  . Abdominal surgery    . Cardiac surgery      . Peripherally inserted central catheter insertion    . Ileostomy      FAMHx:  History reviewed. No pertinent family history.  SOCHx:   reports that he has been smoking Cigarettes.  He has a 42 pack-year smoking history. He has never used smokeless tobacco. He reports that he drinks alcohol. He reports that he uses illicit drugs (Cocaine and Marijuana).  ALLERGIES:  Allergies  Allergen Reactions  . Bee Venom Anaphylaxis    ROS: A comprehensive review of systems was negative except for: Constitutional: positive for fatigue Respiratory: positive for dyspnea on exertion Gastrointestinal: positive for abdominal pain and painful wound stitches Integument/breast: positive for skin color change Behavioral/Psych: positive for bipolar, depression and excessive alcohol consumption  HOME MEDS: Current Outpatient Prescriptions  Medication Sig Dispense Refill  . aspirin 325 MG tablet Take 1 tablet (325 mg total) by mouth daily. For heart health      . diltiazem (DILACOR XR) 180 MG 24 hr capsule Take 1 capsule (180 mg total) by mouth daily. For control of high blood pressure      . DULoxetine (CYMBALTA) 30 MG capsule Take 1 capsule (30 mg total) by mouth daily. For depression  30 capsule  0  . gabapentin (NEURONTIN) 100 MG capsule Take 1 capsule (100 mg total) by mouth 3 (three) times daily. For anxiety/pain management  90 capsule  0  . HYDROcodone-acetaminophen (NORCO/VICODIN) 5-325 MG per tablet Take 1 tablet by mouth every 6 (  six) hours as needed for pain.  30 tablet  0  . lamoTRIgine (LAMICTAL) 25 MG tablet Take 5 tablets (125 mg total) by mouth daily. For mood stabilization  150 tablet  0  . traZODone (DESYREL) 150 MG tablet Take 1 tablet (150 mg total) by mouth at bedtime as needed. For sleep  30 tablet  0   No current facility-administered medications for this visit.   Facility-Administered Medications Ordered in Other Visits  Medication Dose Route Frequency Provider Last Rate Last  Dose  . folic acid (FOLVITE) tablet 1 mg  1 mg Oral Daily Shuvon Rankin, NP      . haloperidol (HALDOL) tablet 5 mg  5 mg Oral Q6H PRN Shuvon Rankin, NP      . LORazepam (ATIVAN) tablet 1 mg  1 mg Oral Q8H PRN Shuvon Rankin, NP      . magnesium hydroxide (MILK OF MAGNESIA) suspension 30 mL  30 mL Oral Daily PRN Shuvon Rankin, NP      . multivitamin with minerals tablet 1 tablet  1 tablet Oral Daily Shuvon Rankin, NP        LABS/IMAGING: No results found for this or any previous visit (from the past 48 hour(s)). No results found.  VITALS: BP 128/68  Pulse 106  Ht 5\' 9"  (1.753 m)  Wt 166 lb 6.4 oz (75.479 kg)  BMI 24.56 kg/m2  EXAM: General appearance: alert and no distress HEENT: PERRLA, EOMI, DMM Lungs: clear to auscultation bilaterally Heart: Regular tachycardia, no murmur GI: soft, non-tender; bowel sounds normal; no masses,  no organomegaly and large midline abdominal insicion with scarring, healing by secodary intention Extremities: extremities normal, atraumatic, no cyanosis or edema Pulses: 2+ and symmetric Skin: Skin color, texture, turgor normal. No rashes or lesions or abnormal abdominal scarring Neurologic: Mental status: Alert, oriented, thought content appropriate Psych: Pleasant, but appears somewhat depressed  EKG: Sinus tachycardia 106, no ischemic changes  ASSESSMENT: 1. Dyspnea and exertion 2. Recent severe sepsis secondary to diverticulitis with numerous surgeries 3. Alcohol dependence 4. Tobacco dependence 5. Bipolar/depression/substance induced mood disorder  PLAN: 1.   Lucas Small continues to have a mild amount of dyspnea on exertion, but no findings supporting heart failure. Affect he looks somewhat volume depleted, which is not surprising giving ongoing alcohol use. Both sepsis and alcohol use could contribute to cardiomyopathy. In addition there could be underlying coronary disease. I would like to get records from the [facility] hospital CTs have an  echocardiogram or some assessment of his LV function. If not then I would recommend a repeat echocardiogram and/or possibly stress testing.  I plan to see him back in one month but will contact him if we decide to pursue an echocardiogram.  Chrystie Nose, MD, [facility] Attending Cardiologist [facility]  Lucas Small,Lucas Small 03/05/2013, 9:21 AM

## 2013-03-11 ENCOUNTER — Encounter: Payer: Self-pay | Admitting: Internal Medicine

## 2013-04-06 ENCOUNTER — Ambulatory Visit: Payer: Medicaid Other | Admitting: Internal Medicine

## 2013-08-13 ENCOUNTER — Emergency Department (HOSPITAL_COMMUNITY): Payer: Medicaid Other

## 2013-08-13 ENCOUNTER — Encounter (HOSPITAL_COMMUNITY): Payer: Self-pay | Admitting: Emergency Medicine

## 2013-08-13 ENCOUNTER — Emergency Department (HOSPITAL_COMMUNITY)
Admission: EM | Admit: 2013-08-13 | Discharge: 2013-08-13 | Disposition: A | Discharge status: Home / Self Care | Payer: Self-pay | Attending: Emergency Medicine | Admitting: Emergency Medicine

## 2013-08-13 DIAGNOSIS — Z8659 Personal history of other mental and behavioral disorders: Secondary | ICD-10-CM | POA: Insufficient documentation

## 2013-08-13 DIAGNOSIS — R109 Unspecified abdominal pain: Secondary | ICD-10-CM | POA: Insufficient documentation

## 2013-08-13 DIAGNOSIS — Z8719 Personal history of other diseases of the digestive system: Secondary | ICD-10-CM | POA: Insufficient documentation

## 2013-08-13 DIAGNOSIS — Z9889 Other specified postprocedural states: Secondary | ICD-10-CM | POA: Insufficient documentation

## 2013-08-13 DIAGNOSIS — I509 Heart failure, unspecified: Secondary | ICD-10-CM | POA: Insufficient documentation

## 2013-08-13 DIAGNOSIS — Z8701 Personal history of pneumonia (recurrent): Secondary | ICD-10-CM | POA: Insufficient documentation

## 2013-08-13 DIAGNOSIS — I10 Essential (primary) hypertension: Secondary | ICD-10-CM | POA: Insufficient documentation

## 2013-08-13 DIAGNOSIS — Z8614 Personal history of Methicillin resistant Staphylococcus aureus infection: Secondary | ICD-10-CM | POA: Insufficient documentation

## 2013-08-13 DIAGNOSIS — F17200 Nicotine dependence, unspecified, uncomplicated: Secondary | ICD-10-CM | POA: Insufficient documentation

## 2013-08-13 DIAGNOSIS — Z87448 Personal history of other diseases of urinary system: Secondary | ICD-10-CM | POA: Insufficient documentation

## 2013-08-13 DIAGNOSIS — Z8709 Personal history of other diseases of the respiratory system: Secondary | ICD-10-CM | POA: Insufficient documentation

## 2013-08-13 LAB — COMPREHENSIVE METABOLIC PANEL
ALT: 14 U/L (ref 0–53)
AST: 15 U/L (ref 0–37)
Albumin: 4 g/dL (ref 3.5–5.2)
Alkaline Phosphatase: 106 U/L (ref 39–117)
BILIRUBIN TOTAL: 0.3 mg/dL (ref 0.3–1.2)
BUN: 14 mg/dL (ref 6–23)
CALCIUM: 10 mg/dL (ref 8.4–10.5)
CHLORIDE: 98 meq/L (ref 96–112)
CO2: 23 meq/L (ref 19–32)
CREATININE: 0.95 mg/dL (ref 0.50–1.35)
Glucose, Bld: 115 mg/dL — ABNORMAL HIGH (ref 70–99)
Potassium: 3.8 mEq/L (ref 3.7–5.3)
Sodium: 136 mEq/L — ABNORMAL LOW (ref 137–147)
Total Protein: 7.9 g/dL (ref 6.0–8.3)

## 2013-08-13 LAB — URINALYSIS, ROUTINE W REFLEX MICROSCOPIC
Glucose, UA: NEGATIVE mg/dL
Hgb urine dipstick: NEGATIVE
KETONES UR: 15 mg/dL — AB
LEUKOCYTES UA: NEGATIVE
NITRITE: NEGATIVE
PROTEIN: NEGATIVE mg/dL
Specific Gravity, Urine: 1.028 (ref 1.005–1.030)
UROBILINOGEN UA: 0.2 mg/dL (ref 0.0–1.0)
pH: 5.5 (ref 5.0–8.0)

## 2013-08-13 LAB — CBC WITH DIFFERENTIAL/PLATELET
Basophils Absolute: 0 10*3/uL (ref 0.0–0.1)
Basophils Relative: 0 % (ref 0–1)
EOS PCT: 3 % (ref 0–5)
Eosinophils Absolute: 0.4 10*3/uL (ref 0.0–0.7)
HEMATOCRIT: 44.3 % (ref 39.0–52.0)
HEMOGLOBIN: 16.3 g/dL (ref 13.0–17.0)
LYMPHS PCT: 19 % (ref 12–46)
Lymphs Abs: 2.4 10*3/uL (ref 0.7–4.0)
MCH: 33.9 pg (ref 26.0–34.0)
MCHC: 36.8 g/dL — AB (ref 30.0–36.0)
MCV: 92.1 fL (ref 78.0–100.0)
MONO ABS: 0.5 10*3/uL (ref 0.1–1.0)
MONOS PCT: 4 % (ref 3–12)
NEUTROS ABS: 9.4 10*3/uL — AB (ref 1.7–7.7)
Neutrophils Relative %: 74 % (ref 43–77)
Platelets: 290 10*3/uL (ref 150–400)
RBC: 4.81 MIL/uL (ref 4.22–5.81)
RDW: 12.7 % (ref 11.5–15.5)
WBC: 12.7 10*3/uL — AB (ref 4.0–10.5)

## 2013-08-13 LAB — LIPASE, BLOOD: LIPASE: 30 U/L (ref 11–59)

## 2013-08-13 MED ORDER — MORPHINE SULFATE 4 MG/ML IJ SOLN
4.0000 mg | Freq: Once | INTRAMUSCULAR | Status: AC
  Administered 2013-08-13: 4 mg via INTRAVENOUS
  Filled 2013-08-13: qty 1

## 2013-08-13 MED ORDER — IOHEXOL 300 MG/ML  SOLN
100.0000 mL | Freq: Once | INTRAMUSCULAR | Status: AC | PRN
  Administered 2013-08-13: 100 mL via INTRAVENOUS

## 2013-08-13 MED ORDER — IOHEXOL 300 MG/ML  SOLN
25.0000 mL | INTRAMUSCULAR | Status: AC
  Administered 2013-08-13: 25 mL via ORAL

## 2013-08-13 MED ORDER — ONDANSETRON HCL 4 MG/2ML IJ SOLN
4.0000 mg | Freq: Once | INTRAMUSCULAR | Status: AC
  Administered 2013-08-13: 4 mg via INTRAVENOUS
  Filled 2013-08-13: qty 2

## 2013-08-13 MED ORDER — HYDROCODONE-ACETAMINOPHEN 5-325 MG PO TABS
2.0000 | ORAL_TABLET | ORAL | Status: DC | PRN
Start: 2013-08-13 — End: 2014-08-27

## 2013-08-13 NOTE — ED Provider Notes (Signed)
Medical screening examination/treatment/procedure(s) were conducted as a shared visit with non-physician practitioner(s) and myself.  I personally evaluated the patient during the encounter.   EKG Interpretation None      Hx of extensive abdominal surgery. CT shows possible pyelo - no symptoms c/w pyelo, urine clear. No V/D. Normal BMs today. On exam, large central scarring. Has purulent discharge from spot in suprapubic area, seen on CT. Normally has purulent discharge, mild amount. No need for I&D or antibiotics. Instructed to f/u with PCP, given small amount of pain meds. Stable for discharge.  Osvaldo Shipper, MD 08/13/13 [phone]

## 2013-08-13 NOTE — ED Provider Notes (Signed)
CSN: [id number]     Arrival date & time 08/13/13  1605 History   First MD Initiated Contact with Patient 08/13/13 1723     Chief Complaint  Patient presents with  . Abdominal Pain     (Consider location/radiation/quality/duration/timing/severity/associated sxs/prior Treatment) HPI Comments: Patient presents emergency department with chief complaint of abdominal pain. He states he has had multiple abdominal surgeries. He has had a history of bowel perforation. He states that he's been having some drainage from a prior surgical site. States his pain is 8/10. He denies fevers, chills, chest pain, shortness of breath. He does endorse nausea, but no vomiting. He hasn't tried anything to alleviate his symptoms. Surgeries were performed by [facility]. He has been having bowel movements.  The history is provided by the patient. No language interpreter was used.    Past Medical History  Diagnosis Date  . Depression   . Alcoholic   . Hypertension   . Renal disorder   . Respiratory failure   . CHF (congestive heart failure)   . Perforation bowel   . PNA (pneumonia)   . MRSA (methicillin resistant Staphylococcus aureus)   . Renal failure    Past Surgical History  Procedure Laterality Date  . Abdominal surgery    . Cardiac surgery    . Peripherally inserted central catheter insertion    . Ileostomy     No family history on file. History  Substance Use Topics  . Smoking status: Current Every Day Smoker -- 1.50 packs/day for 28 years    Types: Cigarettes  . Smokeless tobacco: Never Used  . Alcohol Use: Yes     Comment:  12 pack of beer/week    Review of Systems  All other systems reviewed and are negative.      Allergies  Bee venom  Home Medications  No current outpatient prescriptions on file. BP 140/99  Pulse 100  Temp(Src) 97.6 F (36.4 C) (Oral)  Resp 18  Ht 5\' 9"  (1.753 m)  Wt 186 lb (84.369 kg)  BMI 27.45 kg/m2  SpO2 96% Physical Exam  Nursing note and vitals  reviewed. Constitutional: He is oriented to person, place, and time. He appears well-developed and well-nourished.  HENT:  Head: Normocephalic and atraumatic.  Eyes: Conjunctivae and EOM are normal. Pupils are equal, round, and reactive to light. Right eye exhibits no discharge. Left eye exhibits no discharge. No scleral icterus.  Neck: Normal range of motion. Neck supple. No JVD present.  Cardiovascular: Normal rate, regular rhythm and normal heart sounds.  Exam reveals no gallop and no friction rub.   No murmur heard. Pulmonary/Chest: Effort normal and breath sounds normal. No respiratory distress. He has no wheezes. He has no rales. He exhibits no tenderness.  Abdominal: Soft. He exhibits no distension and no mass. There is tenderness. There is no rebound and no guarding.  Diffuse abdominal tenderness, mild discharge from prior surgical site, no rebound or guarding.   Musculoskeletal: Normal range of motion. He exhibits no edema and no tenderness.  Neurological: He is alert and oriented to person, place, and time.  Skin: Skin is warm and dry.  Psychiatric: He has a normal mood and affect. His behavior is normal. Judgment and thought content normal.    ED Course  Procedures (including critical care time) Results for orders placed during the hospital encounter of 08/13/13  CBC WITH DIFFERENTIAL      Result Value Ref Range   WBC 12.7 (*) 4.0 - 10.5 K/uL  RBC 4.81  4.22 - 5.81 MIL/uL   Hemoglobin 16.3  13.0 - 17.0 g/dL   HCT 44.3  39.0 - 52.0 %   MCV 92.1  78.0 - 100.0 fL   MCH 33.9  26.0 - 34.0 pg   MCHC 36.8 (*) 30.0 - 36.0 g/dL   RDW 12.7  11.5 - 15.5 %   Platelets 290  150 - 400 K/uL   Neutrophils Relative % 74  43 - 77 %   Neutro Abs 9.4 (*) 1.7 - 7.7 K/uL   Lymphocytes Relative 19  12 - 46 %   Lymphs Abs 2.4  0.7 - 4.0 K/uL   Monocytes Relative 4  3 - 12 %   Monocytes Absolute 0.5  0.1 - 1.0 K/uL   Eosinophils Relative 3  0 - 5 %   Eosinophils Absolute 0.4  0.0 - 0.7 K/uL    Basophils Relative 0  0 - 1 %   Basophils Absolute 0.0  0.0 - 0.1 K/uL  COMPREHENSIVE METABOLIC PANEL      Result Value Ref Range   Sodium 136 (*) 137 - 147 mEq/L   Potassium 3.8  3.7 - 5.3 mEq/L   Chloride 98  96 - 112 mEq/L   CO2 23  19 - 32 mEq/L   Glucose, Bld 115 (*) 70 - 99 mg/dL   BUN 14  6 - 23 mg/dL   Creatinine, Ser 0.95  0.50 - 1.35 mg/dL   Calcium 10.0  8.4 - 10.5 mg/dL   Total Protein 7.9  6.0 - 8.3 g/dL   Albumin 4.0  3.5 - 5.2 g/dL   AST 15  0 - 37 U/L   ALT 14  0 - 53 U/L   Alkaline Phosphatase 106  39 - 117 U/L   Total Bilirubin 0.3  0.3 - 1.2 mg/dL   GFR calc non Af Amer >90  >90 mL/min   GFR calc Af Amer >90  >90 mL/min  LIPASE, BLOOD      Result Value Ref Range   Lipase 30  11 - 59 U/L  URINALYSIS, ROUTINE W REFLEX MICROSCOPIC      Result Value Ref Range   Color, Urine YELLOW  YELLOW   APPearance CLEAR  CLEAR   Specific Gravity, Urine 1.028  1.005 - 1.030   pH 5.5  5.0 - 8.0   Glucose, UA NEGATIVE  NEGATIVE mg/dL   Hgb urine dipstick NEGATIVE  NEGATIVE   Bilirubin Urine SMALL (*) NEGATIVE   Ketones, ur 15 (*) NEGATIVE mg/dL   Protein, ur NEGATIVE  NEGATIVE mg/dL   Urobilinogen, UA 0.2  0.0 - 1.0 mg/dL   Nitrite NEGATIVE  NEGATIVE   Leukocytes, UA NEGATIVE  NEGATIVE   Ct Abdomen Pelvis W Contrast  08/13/2013   CLINICAL DATA:  Abdominal pain.  Discharge from abdominal wound.  EXAM: CT ABDOMEN AND PELVIS WITH CONTRAST  TECHNIQUE: Multidetector CT imaging of the abdomen and pelvis was performed using the standard protocol following bolus administration of intravenous contrast.  CONTRAST:  141mL OMNIPAQUE IOHEXOL 300 MG/ML  SOLN  COMPARISON:  11/05/2012  FINDINGS: BODY WALL: There was a lower abdominal wall incision which healed by secondary intention. There is adhesed bowel to the lower abdominal wall. There has been take down of previously seen ileostomy in the right lower quadrant. There is linear thickening of the subcutaneous fat in the suprapubic  abdominal wall, with minimal central fluid measuring up to 4 mm in diameter.  LOWER CHEST: Mild basilar  atelectasis.  ABDOMEN/PELVIS:  Liver: No focal abnormality.  Biliary: No evidence of biliary obstruction or stone.  Pancreas: There is subtle fat infiltration around the tail of the pancreas. Noted negative lipase.  Spleen: Unremarkable.  Adrenals: Unremarkable.  Kidneys and ureters: Patchy renal cortical hypoenhancement on the left more than right, not seen previously. There is no definite cortical thinning in these regions. Resolved urothelial thickening involving the proximal left ureter. There is an 8 mm stone in the lower pole left kidney which is nonobstructive. No evidence of solid mass.  Bladder: Unremarkable.  Reproductive: Unremarkable.  Bowel: No obstruction. Appendectomy changes. There is a colocolostomy at the level of the sigmoid rectal junction. Presacral thickening in this region, with calcification, appears decreased from prior. There is no evidence of residual fluid collection. Few distal colonic diverticula are noted. Enteroenterostomy in the right lower quadrant related to recent ileostomy take-down. Adhesive changes to the ventral abdominal wall.  Retroperitoneum: Presacral tissue as above.  Peritoneum: No free fluid or gas.  Vascular: No acute abnormality.  OSSEOUS: No acute abnormalities.  IMPRESSION: 1. Renal findings which could reflect pyelonephritis. Correlate with urinalysis. 2. Suprapubic abdominal wall scarring with minimal fluid centrally. 3. Stable presacral scar related to remote diverticulitis and sigmoidectomy. 4. Nonobstructive left nephrolithiasis.   Electronically Signed   By: Jorje Guild M.D.   On: 08/13/2013 19:20      EKG Interpretation None      MDM   Final diagnoses:  Abdominal pain    Patient with abdominal pain.  Multiple abdominal surgeries.  Will check labs and CT.  Labs are reassuring. CT is reassuring. No evidence of clinical pyelonephritis.  Recommend the patient followup with his surgeon, and will give the patient the resource guide. Will give him a few Vicodin, and discharged home.  Patient seen by and discussed with Dr. Mingo Amber, who agrees with the plan.    Montine Circle, PA-C 08/13/13 1950

## 2013-08-13 NOTE — ED Notes (Addendum)
Pt with hx of bowel perforation to ED c/o lower abdominal  For several days that has become increasingly worse in the last 5 hours.  Denies changes in bowel or bladder habits.  States he did pass blood in stool 1 week prior, but this is normal for him.  Hx of diverticulitis.  Pt does have minimal drainage from surgical site (01/2013).  Denies etoh for several days and decreased appetite for several weeks.

## 2013-08-13 NOTE — Discharge Instructions (Signed)
Abdominal Pain, Adult °Many things can cause abdominal pain. Usually, abdominal pain is not caused by a disease and will improve without treatment. It can often be observed and treated at home. Your health care provider will do a physical exam and possibly order blood tests and X-rays to help determine the seriousness of your pain. However, in many cases, more time must pass before a clear cause of the pain can be found. Before that point, your health care provider may not know if you need more testing or further treatment. °HOME CARE INSTRUCTIONS  °Monitor your abdominal pain for any changes. The following actions may help to alleviate any discomfort you are experiencing: °· Only take over-the-counter or prescription medicines as directed by your health care provider. °· Do not take laxatives unless directed to do so by your health care provider. °· Try a clear liquid diet (broth, tea, or water) as directed by your health care provider. Slowly move to a bland diet as tolerated. °SEEK MEDICAL CARE IF: °· You have unexplained abdominal pain. °· You have abdominal pain associated with nausea or diarrhea. °· You have pain when you urinate or have a bowel movement. °· You experience abdominal pain that wakes you in the night. °· You have abdominal pain that is worsened or improved by eating food. °· You have abdominal pain that is worsened with eating fatty foods. °SEEK IMMEDIATE MEDICAL CARE IF:  °· Your pain does not go away within 2 hours. °· You have a fever. °· You keep throwing up (vomiting). °· Your pain is felt only in portions of the abdomen, such as the right side or the left lower portion of the abdomen. °· You pass bloody or black tarry stools. °MAKE SURE YOU: °· Understand these instructions.   °· Will watch your condition.   °· Will get help right away if you are not doing well or get worse.   °Document Released: 02/21/2005 Document Revised: 03/04/2013 Document Reviewed: 01/21/2013 °ExitCare® Patient  Information ©2014 [employer]. °  Emergency Department Resource Guide °1) Find a Doctor and Pay Out of Pocket °Although you won't have to find out who is covered by your insurance plan, it is a good idea to ask around and get recommendations. You will then need to call the office and see if the doctor you have chosen will accept you as a new patient and what types of options they offer for patients who are self-pay. Some doctors offer discounts or will set up payment plans for their patients who do not have insurance, but you will need to ask so you aren't surprised when you get to your appointment. ° °2) Contact Your Local Health Department °Not all health departments have doctors that can see patients for sick visits, but many do, so it is worth a call to see if yours does. If you don't know where your local health department is, you can check in your phone book. The CDC also has a tool to help you locate your state's health department, and many state websites also have listings of all of their local health departments. ° °3) Find a Walk-in Clinic °If your illness is not likely to be very severe or complicated, you may want to try a walk in clinic. These are popping up all over the country in pharmacies, drugstores, and shopping centers. They're usually staffed by nurse practitioners or physician assistants that have been trained to treat common illnesses and complaints. They're usually fairly quick and inexpensive. However, if you have   serious medical issues or chronic medical problems, these are probably not your best option. ° °No Primary Care Doctor: °- Call [facility] at  832-8000 - they can help you locate a primary care doctor that  accepts your insurance, provides certain services, etc. °- Physician Referral Service- 1-800-533-3463 ° °Chronic Pain Problems: °Organization         Address  Phone   Notes  °[facility]  (336) 297-2271 Patients need to be referred by their primary care  doctor.  ° °Medication Assistance: °Organization         Address  Phone   Notes  °[place] [facility] [address] °[place], [place] [address] (336) 641-8030 --Must be a resident of [place] °-- Must have NO insurance coverage whatsoever (no Medicaid/ Medicare, etc.) °-- The pt. MUST have a primary care doctor that directs their care regularly and follows them in the community °  °[facility]  (866) 331-1348   °[employer]  (888) 892-1162   ° °Agencies that provide inexpensive medical care: °Organization         Address  Phone   Notes  °[facility]  (336) 832-8035   °[facility]    (336) 832-7272   °[facility] [address] °[place], [place] [address] (336) 832-4777   °[facility] [address], °[place] (336) 271-4999   °[facility]    (336) 373-0678   °[facility]    (336) 272-1050   °[facility] ° [address], [place] Phone:  (336) 832-4444, Fax:  (336) 832-4440 Hours of Operation:  9 am - 6 pm, M-F.  Also accepts Medicaid/Medicare and self-pay.  °[facility] ° [address], [place] Phone: (336) [phone], Fax: (336) [phone]. Hours of Operation:  8:30 am - 5:30 pm, M-F.  Also accepts Medicaid and self-pay.  °[facility] [place] [address], [place] Phone: (336) 878-6027   °[facility] [address], [place], [place] (336)723-1848, Ext. 123 Mondays & Thursdays: 7-9 AM.  First 15 patients are seen on a first come, first serve basis. °  ° °Medicaid-accepting [place] Providers: ° °Organization         Address  Phone   Notes  °[facility] [address], [place] (336) 641-2100 Also accepts self-pay patients.  °[facility] [address], [place] ° (336) 856-9996   °[facility] [address], [place] (336) 288-8857   °[facility] [address] [place] [address], [place] (336) 299-7000   °Veita Bland [address], [place]  ° (336) 373-1557 Only accepts [place] Access Medicaid patients after they have their name applied to their card.  ° °Self-Pay (no insurance) in [place]: ° °Organization         Address  Phone   Notes  °Sickle Cell Patients, [facility] [address], [place] (336) 832-1970   °[facility] [address], [place] (336) 832-4400   ° [facility] ° [address], [place] (336) 992-4800   °[facility]/Dr. Osei-Bonsu ° [address] [place] [address], [place] or [address], [place] (336) [phone] Phone number for both [place] and  locations   is the same.  °[facility] [address], [place] (336) 299-0000   °[facility] [address] [place] [address], [place] or [address] (336) 852-7530 °(336) 878-2260   °[facility] [address], [place] (336) 350-1642, phone; (336) 294-5005, fax Sees patients 1st and 3rd Saturday of every month.  Must not qualify for public or private insurance (i.e. Medicaid, Medicare, [place] Health Choice, Veterans' Benefits) • Household income should be no more than 200% of the poverty level •The clinic cannot treat you if you are pregnant or think you are pregnant • Sexually transmitted diseases are not treated at the clinic.  ° °Dental Care: °Organization         Address  Phone  Notes  °[facility] [address], [place] (336) 641-6152 Accepts children up to age 21 who are enrolled in Medicaid or [place] Health Choice; pregnant women with a Medicaid card; and children who have applied for Medicaid or [place] Health Choice, but were declined, whose parents can pay a reduced fee at time of  service.  °[facility] [place]  [address], [place] (336) 641-7733 Accepts children up to age 21 who are enrolled in Medicaid or [place] Health Choice; pregnant women with a Medicaid card; and children who have applied for Medicaid or [place] Health Choice, but were declined, whose parents can pay a reduced fee at time of service.  °[facility] ° [address], [place] (336) 641-4533 Patients are seen by appointment only. Walk-ins are not accepted. [facility] will see patients 18 years of age and older. °Monday - Tuesday (8am-5pm) °Most Wednesdays (8:30-5pm) °$30 per visit, cash only  °[facility] ° [address], [place] (336) 641-4533 Patients are seen by appointment only. Walk-ins are not accepted. [facility] will see patients 18 years of age and older. °One Wednesday Evening (Monthly: Volunteer Based).  $30 per visit, cash only  °[facility]  (919) 537-3737 for adults; Children under age 4, call Graduate Pediatric Dentistry at ([phone]) [phone]. Children aged 4-14, please call (919) [phone] to request a pediatric application. ° Dental services are provided in all areas of dental care including fillings, crowns and bridges, complete and partial dentures, implants, gum treatment, root canals, and extractions. Preventive care is also provided. Treatment is provided to both adults and children. °Patients are selected via a lottery and there is often a waiting list. °  °[facility] [address], °[place] ° (336) 763-8833 [email or web address] °  °[facility] [address], [place], [place] (336)723-1848, Ext. 123 Second and Fourth Thursday of each month, opens at 6:30 AM; Clinic ends at 9 AM.  Patients are seen on a first-come first-served basis, and a limited number are seen during each clinic.  ° °[facility] ° [address], [place],   (336) [phone]   Eligibility Requirements °You must have lived in [place], [place], or [place] counties for at least the last three months. °  You cannot be eligible for state or federal sponsored healthcare insurance, including Veterans Administration, Medicaid, or Medicare. °  You generally cannot be eligible for healthcare insurance through your employer.  °  How to apply: °Eligibility screenings are held every Tuesday and Wednesday afternoon from 1:00 pm until 4:00 pm. You do not need an appointment   for the interview!  °[facility] [address], [place], [place] 336-631-2330   °[facility]  336-342-8273   °[facility]  336-703-3100   °[facility]  336-570-6415   ° °Behavioral Health Resources in the Community: °Intensive Outpatient Programs °Organization         Address  Phone  Notes  °[facility] [address], [place], [place] 336-878-6098   °[facility] [address], [place], [place] 336-832-9800   °[facility] [address], [place], [place] ° 336-882-2125   °[facility] [address],  °[place], [place] 1-800-853-5163 or 336-641-4981   °Substance Abuse Resources °Organization         Address  Phone  Notes  °[facility]  336-882-2125   °[facility]  336-784-9470   °[facility]  336-285-9073   °[facility]  336-845-3988   °Residential & Outpatient Substance Abuse Program  1-800-659-3381   °Psychological Services °Organization         Address  Phone  Notes  °[facility]  336- 832-9600   °[facility]  336- 378-7881   °[facility] [address], [place] 1-800-853-5163 or 336-641-4981   ° °Mobile Crisis Teams °Organization         Address  Phone  Notes  °[facility], Mobile Crisis Care Unit  1-877-626-1772   °Assertive °[facility] ° [address] [place], [place] 336-834-9664   °Sharon DeEsch [address] °[place] 336-554-5454   ° °Self-Help/Support Groups °Organization         Address  Phone             Notes  °[facility] - variety of support groups  336- 373-1402 Call for more information  °Narcotics Anonymous (NA), [facility] [address], °[place]  2 meetings at this location  ° °Residential Treatment Programs °Organization         Address  Phone  Notes  °[facility] [address],    °[place]  1-866-801-8205   °[facility] ° [address], [place], [place] 704-293-8524   °[facility] [address], [place] 336-845-3988 Admissions: 8am-3pm M-F  °[facility] [address]    °[place], [place] 336-841-1104   °[facility] [address], [place], [place] 336-379-7146   °[facility] [address]  °[place], [place] 336-285-9073   °[facility] Intensive Outpatient [address], [place], [place] 336-852-3033   °[facility] [address]  °[place], [place] 1-877-615-2722 or 336-784-9470   °[facility] [address] [place], [place] 336-227-7417 Accepts Medicaid  °[facility] [address]  °[place] 1-800-659-3381 Substance Abuse/Addiction Treatment  ° °[place] Behavioral Health Resources °Organization         Address  Phone  Notes  °[facility]  (888) 581-9988   °Julie Brannon, PhD [address] [place], [place]   (336) 349-5553 or (336) 951-0000   °[facility]   [address] °[place], [place] (336) 349-4454   °[facility] [address], [place], [place] (336) 342-8316 Insurance/Medicaid/sponsorship through [facility]  °[facility] [address]                                      [place], [place] (336) 342-8316  Therapy/tele-psych/case  °[facility] [address]  ° [place], [place] (336) 349-2233    °Dr. Arfeen  (336) 349-4544   °[facility] [place]  [employer] [place] [facility] 1) [address], [place] °2) [address], [place] °3)  [address], [place] (336) 349-3220 °(336) 342-7768 ° °(336) 342-8140   °[place] Child [facility] (336) 342-1394 or (336) 342-3537 (After Hours)    ° °   °

## 2013-08-13 NOTE — ED Notes (Signed)
Pt has competed oral CT contrast; CT called/notified

## 2014-08-27 ENCOUNTER — Emergency Department (HOSPITAL_COMMUNITY): Payer: PPO

## 2014-08-27 ENCOUNTER — Emergency Department (HOSPITAL_COMMUNITY)
Admission: EM | Admit: 2014-08-27 | Discharge: 2014-08-27 | Disposition: A | Discharge status: Home / Self Care | Payer: PPO | Attending: Emergency Medicine | Admitting: Emergency Medicine

## 2014-08-27 ENCOUNTER — Emergency Department (INDEPENDENT_AMBULATORY_CARE_PROVIDER_SITE_OTHER)
Admission: EM | Admit: 2014-08-27 | Discharge: 2014-08-27 | Disposition: A | Discharge status: Nursing Home (Includes ICF) | Payer: Self-pay | Source: Home / Self Care | Attending: Family Medicine | Admitting: Family Medicine

## 2014-08-27 ENCOUNTER — Encounter (HOSPITAL_COMMUNITY): Payer: Self-pay | Admitting: *Deleted

## 2014-08-27 DIAGNOSIS — Z720 Tobacco use: Secondary | ICD-10-CM | POA: Insufficient documentation

## 2014-08-27 DIAGNOSIS — I509 Heart failure, unspecified: Secondary | ICD-10-CM | POA: Insufficient documentation

## 2014-08-27 DIAGNOSIS — R079 Chest pain, unspecified: Secondary | ICD-10-CM | POA: Insufficient documentation

## 2014-08-27 DIAGNOSIS — I10 Essential (primary) hypertension: Secondary | ICD-10-CM | POA: Insufficient documentation

## 2014-08-27 DIAGNOSIS — Z8614 Personal history of Methicillin resistant Staphylococcus aureus infection: Secondary | ICD-10-CM | POA: Insufficient documentation

## 2014-08-27 DIAGNOSIS — R1084 Generalized abdominal pain: Secondary | ICD-10-CM

## 2014-08-27 DIAGNOSIS — K439 Ventral hernia without obstruction or gangrene: Secondary | ICD-10-CM | POA: Insufficient documentation

## 2014-08-27 DIAGNOSIS — Z8701 Personal history of pneumonia (recurrent): Secondary | ICD-10-CM | POA: Insufficient documentation

## 2014-08-27 DIAGNOSIS — Z8709 Personal history of other diseases of the respiratory system: Secondary | ICD-10-CM | POA: Insufficient documentation

## 2014-08-27 DIAGNOSIS — Z87448 Personal history of other diseases of urinary system: Secondary | ICD-10-CM | POA: Diagnosis not present

## 2014-08-27 DIAGNOSIS — R109 Unspecified abdominal pain: Secondary | ICD-10-CM

## 2014-08-27 LAB — CBC WITH DIFFERENTIAL/PLATELET
BASOS ABS: 0.1 10*3/uL (ref 0.0–0.1)
Basophils Relative: 1 % (ref 0–1)
Eosinophils Absolute: 0.5 10*3/uL (ref 0.0–0.7)
Eosinophils Relative: 5 % (ref 0–5)
HCT: 48 % (ref 39.0–52.0)
Hemoglobin: 16.8 g/dL (ref 13.0–17.0)
LYMPHS PCT: 21 % (ref 12–46)
Lymphs Abs: 2.3 10*3/uL (ref 0.7–4.0)
MCH: 31.6 pg (ref 26.0–34.0)
MCHC: 35 g/dL (ref 30.0–36.0)
MCV: 90.2 fL (ref 78.0–100.0)
MONO ABS: 0.6 10*3/uL (ref 0.1–1.0)
Monocytes Relative: 5 % (ref 3–12)
NEUTROS ABS: 7.5 10*3/uL (ref 1.7–7.7)
Neutrophils Relative %: 68 % (ref 43–77)
PLATELETS: 301 10*3/uL (ref 150–400)
RBC: 5.32 MIL/uL (ref 4.22–5.81)
RDW: 13.5 % (ref 11.5–15.5)
WBC: 11 10*3/uL — ABNORMAL HIGH (ref 4.0–10.5)

## 2014-08-27 LAB — COMPREHENSIVE METABOLIC PANEL
ALT: 36 U/L (ref 0–53)
AST: 26 U/L (ref 0–37)
Albumin: 3.9 g/dL (ref 3.5–5.2)
Alkaline Phosphatase: 106 U/L (ref 39–117)
Anion gap: 5 (ref 5–15)
BUN: 20 mg/dL (ref 6–23)
CALCIUM: 9.8 mg/dL (ref 8.4–10.5)
CO2: 28 mmol/L (ref 19–32)
Chloride: 103 mmol/L (ref 96–112)
Creatinine, Ser: 1.24 mg/dL (ref 0.50–1.35)
GFR calc Af Amer: 79 mL/min — ABNORMAL LOW (ref >90)
GFR calc non Af Amer: 68 mL/min — ABNORMAL LOW (ref >90)
Glucose, Bld: 143 mg/dL — ABNORMAL HIGH (ref 70–99)
Potassium: 4.2 mmol/L (ref 3.5–5.1)
SODIUM: 136 mmol/L (ref 135–145)
TOTAL PROTEIN: 7.5 g/dL (ref 6.0–8.3)
Total Bilirubin: 0.4 mg/dL (ref 0.3–1.2)

## 2014-08-27 LAB — ETHANOL

## 2014-08-27 LAB — RAPID URINE DRUG SCREEN, HOSP PERFORMED
AMPHETAMINES: NOT DETECTED
BENZODIAZEPINES: NOT DETECTED
Barbiturates: NOT DETECTED
Cocaine: NOT DETECTED
OPIATES: NOT DETECTED
TETRAHYDROCANNABINOL: NOT DETECTED

## 2014-08-27 LAB — URINALYSIS, ROUTINE W REFLEX MICROSCOPIC
BILIRUBIN URINE: NEGATIVE
GLUCOSE, UA: NEGATIVE mg/dL
Hgb urine dipstick: NEGATIVE
KETONES UR: NEGATIVE mg/dL
LEUKOCYTES UA: NEGATIVE
Nitrite: NEGATIVE
PH: 5.5 (ref 5.0–8.0)
Protein, ur: NEGATIVE mg/dL
SPECIFIC GRAVITY, URINE: 1.022 (ref 1.005–1.030)
Urobilinogen, UA: 0.2 mg/dL (ref 0.0–1.0)

## 2014-08-27 LAB — TROPONIN I

## 2014-08-27 LAB — LIPASE, BLOOD: Lipase: 28 U/L (ref 11–59)

## 2014-08-27 MED ORDER — IOHEXOL 300 MG/ML  SOLN
25.0000 mL | Freq: Once | INTRAMUSCULAR | Status: AC | PRN
  Administered 2014-08-27: 25 mL via ORAL

## 2014-08-27 MED ORDER — SODIUM CHLORIDE 0.9 % IV BOLUS (SEPSIS): 1000.0000 mL | Freq: Once | INTRAVENOUS | Status: DC

## 2014-08-27 MED ORDER — MORPHINE SULFATE 4 MG/ML IJ SOLN
4.0000 mg | Freq: Once | INTRAMUSCULAR | Status: AC
  Administered 2014-08-27: 4 mg via INTRAVENOUS
  Filled 2014-08-27: qty 1

## 2014-08-27 MED ORDER — HYDROCODONE-ACETAMINOPHEN 5-325 MG PO TABS: 1.0000 | ORAL_TABLET | Freq: Four times a day (QID) | ORAL | Status: DC | PRN

## 2014-08-27 MED ORDER — IOHEXOL 300 MG/ML  SOLN
80.0000 mL | Freq: Once | INTRAMUSCULAR | Status: AC | PRN
  Administered 2014-08-27: 80 mL via INTRAVENOUS

## 2014-08-27 NOTE — Discharge Instructions (Signed)
Please call your doctor for a followup appointment within 24-48 hours. When you talk to your doctor please let them know that you were seen in the emergency department and have them acquire all of your records so that they can discuss the findings with you and formulate a treatment plan to fully care for your new and ongoing problems. Please call and set-up an appointment with Health and [place]  Please call and set-up an appointment with General surgery regarding ventral hernia Please rest and stay hydrated  Please avoid any physical or strenuous activity  Please take medications as prescribed - while on pain medications there is to be no drinking alcohol, driving, operating any heavy machinery. If extra please dispose in a proper manner. Please do not take any extra Tylenol with this medication for this can lead to Tylenol overdose and liver issues.  Please continue to monitor symptoms closely and if symptoms are to worsen or change (fever greater than 101, chills, sweating, nausea, vomiting, chest pain, shortness of breathe, difficulty breathing, weakness, numbness, tingling, worsening or changes to pain pattern, fall, injury, changes to skin color, dizziness, weakness, inability to keep food or fluids down) please report back to the Emergency Department immediately.    Hernia A hernia occurs when an internal organ pushes out through a weak spot in the abdominal wall. Hernias most commonly occur in the groin and around the navel. Hernias often can be pushed back into place (reduced). Most hernias tend to get worse over time. Some abdominal hernias can get stuck in the opening (irreducible or incarcerated hernia) and cannot be reduced. An irreducible abdominal hernia which is tightly squeezed into the opening is at risk for impaired blood supply (strangulated hernia). A strangulated hernia is a medical emergency. Because of the risk for an irreducible or strangulated hernia, surgery may be  recommended to repair a hernia. CAUSES   Heavy lifting.  Prolonged coughing.  Straining to have a bowel movement.  A cut (incision) made during an abdominal surgery. HOME CARE INSTRUCTIONS   Bed rest is not required. You may continue your normal activities.  Avoid lifting more than 10 pounds (4.5 kg) or straining.  Cough gently. If you are a smoker it is best to stop. Even the best hernia repair can break down with the continual strain of coughing. Even if you do not have your hernia repaired, a cough will continue to aggravate the problem.  Do not wear anything tight over your hernia. Do not try to keep it in with an outside bandage or truss. These can damage abdominal contents if they are trapped within the hernia sac.  Eat a normal diet.  Avoid constipation. Straining over long periods of time will increase hernia size and encourage breakdown of repairs. If you cannot do this with diet alone, stool softeners may be used. SEEK IMMEDIATE MEDICAL CARE IF:   You have a fever.  You develop increasing abdominal pain.  You feel nauseous or vomit.  Your hernia is stuck outside the abdomen, looks discolored, feels hard, or is tender.  You have any changes in your bowel habits or in the hernia that are unusual for you.  You have increased pain or swelling around the hernia.  You cannot push the hernia back in place by applying gentle pressure while lying down. MAKE SURE YOU:   Understand these instructions.  Will watch your condition.  Will get help right away if you are not doing well or get worse. Document Released:  05/14/2005 Document Revised: 08/06/2011 Document Reviewed: 01/01/2008 ExitCare Patient Information 2015 [place], [place]. This information is not intended to replace advice given to you by your health care provider. Make sure you discuss any questions you have with your health care provider.   Emergency Department Resource Guide 1) Find a Doctor and Pay Out of  Pocket Although you won't have to find out who is covered by your insurance plan, it is a good idea to ask around and get recommendations. You will then need to call the office and see if the doctor you have chosen will accept you as a new patient and what types of options they offer for patients who are self-pay. Some doctors offer discounts or will set up payment plans for their patients who do not have insurance, but you will need to ask so you aren't surprised when you get to your appointment.  2) Contact Your Local Health Department Not all health departments have doctors that can see patients for sick visits, but many do, so it is worth a call to see if yours does. If you don't know where your local health department is, you can check in your phone book. The CDC also has a tool to help you locate your state's health department, and many state websites also have listings of all of their local health departments.  3) Find a [facility] If your illness is not likely to be very severe or complicated, you may want to try a walk in clinic. These are popping up all over the country in pharmacies, drugstores, and shopping centers. They're usually staffed by nurse practitioners or physician assistants that have been trained to treat common illnesses and complaints. They're usually fairly quick and inexpensive. However, if you have serious medical issues or chronic medical problems, these are probably not your best option.  No Primary Care Doctor: - Call Health Connect at  [phone] - they can help you locate a primary care doctor that  accepts your insurance, provides certain services, etc. - Physician Referral Service- [phone]  Chronic Pain Problems: Organization         Address  Phone   Notes  [facility]  [phone] Patients need to be referred by their primary care doctor.   Medication Assistance: Organization         Address  Phone   Notes  [facility]  Medication [facility] [place], [place] [address] [phone] --Must be a resident of [facility] -- Must have NO insurance coverage whatsoever (no Medicaid/ Medicare, etc.) -- The pt. MUST have a primary care doctor that directs their care regularly and follows them in the community   Med[facility]ist  [phone]   [employer]  [phone]    Agencies that provide inexpensive medical care: Organization         Address  Phone   Notes  [place]  [phone]   [facility]    [phone]   [facility] [place], [place] [address] [phone]   [place] [address], [place] [phone]   Planned Parenthood    [phone]   [facility]    [phone]   [place] and [place] [address], [place] Phone:  [phone], Fax:  [phone] Hours of Operation:  9 am - 6 pm, M-F.  Also  accepts Medicaid/Medicare and self-pay.  [facility] for [place], [address], [place] Phone: [phone], Fax: [phone]. Hours of Operation:  8:30 am - 5:30 pm, M-F.  Also accepts Medicaid and self-pay.  [facility] [address], [place] Phone: [phone]   [place], [place], [place] [phone], Ext. [phone] Mondays & Thursdays: 7-9 AM.  First 15 patients are seen on a first come, first serve basis.    [place] Providers:  Organization         Address  Phone   Notes  [facility] [address], [place] [phone] Also accepts self-pay patients.  [facility] [address], [place]  [phone]   [place], [address], [place] [phone]   [facility] [address] [place] [phone]   Lucianne Lei [address], [place]   [phone] Only accepts [place] Access [place] patients after they have their name applied to their card.   Self-Pay (no insurance) in [facility]:  Organization         Address  Phone   Notes  Sickle Cell Patients, [facility] [place] [phone]   [facility] [place] [phone]   [facility]  [place], [place], [place] [phone]   [facility]/Dr. Osei-Bonsu  [address], [place] or [address], [place] [phone] Phone number for both [place] and [place] locations is the same.  [facility] [address] [place] [phone]   [facility] [address] [place] or [address] [phone]   [facility] [address] [place] [phone], phone; [phone], fax Sees patients 1st and 3rd Saturday of every month.  Must not qualify for public or private insurance (i.e. Medicaid, Medicare, [place] Health Choice, Veterans' Benefits)  Household income should be no more than 200% of the poverty level The clinic cannot treat you if you are pregnant or think you are pregnant  Sexually transmitted diseases are not treated at the clinic.    Dental Care: Organization         Address  Phone  Notes  [facility] of [facility] [place] [phone] Accepts children up to age 47 who are enrolled in [place] or [place]; pregnant women with a Medicaid card; and children who have applied for Medicaid or [place] Health Choice, but were declined, whose parents can pay a reduced fee at time of service.  [place] [facility] of [facility]  [address], [place]  [phone] Accepts children up to age 72 who are enrolled in [place] or [place]; pregnant women with a Medicaid card; and children who have applied for Medicaid or [place] Health Choice, but were declined, whose parents can pay a reduced fee at time of service.  [facility]  [place] [phone] Patients are seen by appointment only. Walk-ins are not accepted. [place] will see patients 43 years of age and older. Monday - Tuesday (8am-5pm)  Most Wednesdays (8:30-5pm) $30 per visit, cash only  [facility] Adult [employer] PRO[facility]  [address], [facility] [phone] Patients are seen by appointment only. Walk-ins are not accepted. [place] will see patients 68 years of age and older. One Wednesday Evening (Monthly: Volunteer Based).  $30 per visit, cash only  [place]  [phone] for adults; Children under age 54, call Graduate Pediatric Dentistry at [phone]. Children aged 70-14, please call [phone] to request a pediatric application.  Dental services are provided in all areas of dental care including fillings, crowns and bridges, complete and partial dentures, implants, gum treatment, root canals, and extractions. Preventive care is also provided. Treatment is provided to both adults and children. Patients are selected via a lottery and there is often a waiting list.   [facility] [address] [place]  [phone] [email or web address]   [facility] [address] [place], [place] [phone], Ext. [phone] Second and Fourth Thursday of each month, opens at 6:30 AM; Clinic ends at 9 AM.  Patients are seen on a first-come first-served basis, and a limited number are seen during each clinic.   [facility]  [address] Hillard Danker [place], [place] [phone]   Eligibility Requirements You must have lived in [place], [place], or [place]  counties for at least the last three months.   You cannot be eligible for state or federal sponsored [employer], including [employer], [place], or [employer].   You generally cannot be eligible for healthcare insurance through your employer.    How to apply: Eligibility screenings are held every Tuesday and Wednesday afternoon from 1:00 pm until 4:00 pm. You do not need an appointment for the interview!  [facility] [address] [place], [place]   [place]  [place] Department  [place]  [phone]    Behavioral Health Resources in the Community: Intensive Outpatient Programs Organization         Address  Phone  Notes  [place]. [address] [place], [place] [phone]   [facility] [address] [place], [place]   ADS: Alcohol & Drug [facility] [address] [place], [place]   [place] [address]  [place], [place] or [phone]   Substance Abuse Resources Organization         Address  Phone  Notes  Alcohol and Drug Services  [phone]   [place]  [phone]   [facility] [place]   Chinita Pester  [phone]   Residential & Outpatient Substance Abuse Program  [phone]   Psychological Services Organization         Address  Phone  Notes  [facility] [place]  [place]  [phone]   [place] [address], [place] or [phone]    Mobile Crisis Teams Organization         Address  Phone  Notes  [facility], Mobile Crisis Care Unit  [phone]   [facility]  [address] [place], [place]   [place] Levels [address], [place] [phone]    Self-Help/Support Groups Organization         Address  Phone  Notes  [facility] [place] - variety of support groups  [place] Call for more information  Narcotics Anonymous (NA), [facility] [address], [employer] [place]  2 meetings at this location   Special educational needs teacher         Address  Phone  Notes  [facility]idential Treatment [place],    [place]  [phone]   [facility]  [address] [place] [id number], [place], [place]   [place], [place] [phone] Admissions: 8am-3pm M-F  Incentives Substance [place] [address]    [place], [place] [phone]   [facility] [address] [place], [place], [place]   [facility] [address],  [place], [place]   [facility] Programs - Intensive Outpatient [address] Kristeen Mans [address], [place], [place]   [facility] ([place].,  [place], [place] [phone] or [phone]   [facility] Services (RTS) [address], [place], [place] Accepts Medicaid  [facility] [place] [address],  [place] [phone] Substance Abuse/Addiction Treatment   [facility] Organization         Address  Phone  Notes  [facility]  [phone]   Domenic Schwab, PhD [address] Arlis Porta [place], [place]   [phone] or [phone]   [place], [place] [phone]   [facility] [address], [place], [place] [phone] Insurance/Medicaid/sponsorship through [facility] and Families [address]                                    [place], [place] [phone] [place] [address], [place] [phone]    Dr. Adele Schilder  [phone]   [facility] Dept. 1) [address] [place] 2) [place] 3)  [place] [address], [place] [phone]  [phone]   [place] [phone] or [phone] (After Hours)

## 2014-08-27 NOTE — ED Notes (Signed)
The pt has multiple abd hernia surgeries in the past year.  He has draining lesions in his scars and he reports that he has been pushing the protruding areas back in place.

## 2014-08-27 NOTE — ED Provider Notes (Signed)
CSN: [id number]     Arrival date & time 08/27/14  1412 History   First MD Initiated Contact with Patient 08/27/14 1723     Chief Complaint  Patient presents with  . Abdominal Pain     (Consider location/radiation/quality/duration/timing/severity/associated sxs/prior Treatment) Patient is a 49 y.o. male presenting with abdominal pain. The history is provided by the patient. No language interpreter was used.  Abdominal Pain Associated symptoms: chest pain   Associated symptoms: no chills, no constipation, no diarrhea, no dysuria, no fever, no hematuria, no nausea, no shortness of breath and no vomiting   COADY TRAIN is a 49 year old male with past medical history of depression, alcohol, hypertension, renal disorder, congestive heart failure, pneumonia, perforated bowels, numerous abdominal surgeries-as per patient at least 14 abdominal surgeries, history of septic shock 2 years ago in [place] presenting to the ED with abdominal pain that has been ongoing for approximately one month. Patient reports that he has history of hernias that never been repaired. Reported that his right upper quadrant hernia has gotten larger over the past month has been having increased pain that has been constant. Stated that standing up makes the pain worse. Reported that he's been using a wrap to aid in relief. Reported that most of his surgeries were performed at [facility] - reported that the surgeon was Dr. Zeb Comfort - reported that he does not want to go back to Dr. Mia Creek. Stated that he recently just got insurance and reported that he wanted to get his abdominal checked out today. stated that there has been drainage coming from the wounds of his abdomen - but stated that this is normal to occur. Patient reported that he has been having some intermittent chest pain with the abdominal pain - has been on and off for a couple of months now localized to the center of his chest. Reported that he has been using Tylenol as  needed for pain. Denied nausea, vomiting, diarrhea, melena, hematochezia, fever, chills, chest pain, shortness of breath, difficulty breathing, hematuria, dysuria. PCP none   Past Medical History  Diagnosis Date  . Depression   . Alcoholic   . Hypertension   . Renal disorder   . Respiratory failure   . CHF (congestive heart failure)   . Perforation bowel   . PNA (pneumonia)   . MRSA (methicillin resistant Staphylococcus aureus)   . Renal failure    Past Surgical History  Procedure Laterality Date  . Abdominal surgery    . Cardiac surgery    . Peripherally inserted central catheter insertion    . Ileostomy     No family history on file. History  Substance Use Topics  . Smoking status: Current Every Day Smoker -- 1.50 packs/day for 28 years    Types: Cigarettes  . Smokeless tobacco: Never Used  . Alcohol Use: Yes     Comment:  12 pack of beer/week    Review of Systems  Constitutional: Negative for fever and chills.  Respiratory: Negative for chest tightness and shortness of breath.   Cardiovascular: Positive for chest pain.  Gastrointestinal: Positive for abdominal pain. Negative for nausea, vomiting, diarrhea, constipation, blood in stool and anal bleeding.  Genitourinary: Negative for dysuria and hematuria.  Musculoskeletal: Negative for back pain and neck pain.      Allergies  Bee venom  Home Medications   Prior to Admission medications   Medication Sig Start Date End Date Taking? Authorizing Provider  HYDROcodone-acetaminophen (NORCO/VICODIN) 5-325 MG per tablet Take 1  tablet by mouth every 6 (six) hours as needed for severe pain. 08/27/14   Faraaz Wolin, PA-C   BP 124/85 mmHg  Pulse 99  Temp(Src) 98 F (36.7 C) (Oral)  Resp 17  Ht 5\' 9"  (1.753 m)  Wt 215 lb (97.523 kg)  BMI 31.74 kg/m2  SpO2 90% Physical Exam  Constitutional: He is oriented to person, place, and time. He appears well-developed and well-nourished. No distress.  HENT:  Head:  Normocephalic and atraumatic.  Eyes: Conjunctivae and EOM are normal. Pupils are equal, round, and reactive to light. Right eye exhibits no discharge. Left eye exhibits no discharge.  Neck: Normal range of motion. Neck supple. No tracheal deviation present.  Cardiovascular: Normal rate, regular rhythm and normal heart sounds.  Exam reveals no friction rub.   No murmur heard. Pulmonary/Chest: Effort normal and breath sounds normal. No respiratory distress. He has no wheezes. He has no rales.  Abdominal: Soft. Bowel sounds are normal. He exhibits no distension. There is tenderness. There is no rebound and no guarding.  Abdomen identified with numerous healed over scars. Scant amount of drainage identified-patient reported that this is normal. Tenderness upon palpation to the right upper aspect of the abdomen, hernia identified with negative findings of incarceration-bowel sounds auscultated, hernia soft and easily reducible.  Lymphadenopathy:    He has no cervical adenopathy.  Neurological: He is alert and oriented to person, place, and time. No cranial nerve deficit. He exhibits normal muscle tone. Coordination normal.  Skin: Skin is warm and dry. No rash noted. He is not diaphoretic. No erythema.  Psychiatric: He has a normal mood and affect. His behavior is normal. Thought content normal.  Nursing note and vitals reviewed.   ED Course  Procedures (including critical care time)  Results for orders placed or performed during the hospital encounter of 08/27/14  CBC with Differential  Result Value Ref Range   WBC 11.0 (H) 4.0 - 10.5 K/uL   RBC 5.32 4.22 - 5.81 MIL/uL   Hemoglobin 16.8 13.0 - 17.0 g/dL   HCT 48.0 39.0 - 52.0 %   MCV 90.2 78.0 - 100.0 fL   MCH 31.6 26.0 - 34.0 pg   MCHC 35.0 30.0 - 36.0 g/dL   RDW 13.5 11.5 - 15.5 %   Platelets 301 150 - 400 K/uL   Neutrophils Relative % 68 43 - 77 %   Neutro Abs 7.5 1.7 - 7.7 K/uL   Lymphocytes Relative 21 12 - 46 %   Lymphs Abs 2.3 0.7  - 4.0 K/uL   Monocytes Relative 5 3 - 12 %   Monocytes Absolute 0.6 0.1 - 1.0 K/uL   Eosinophils Relative 5 0 - 5 %   Eosinophils Absolute 0.5 0.0 - 0.7 K/uL   Basophils Relative 1 0 - 1 %   Basophils Absolute 0.1 0.0 - 0.1 K/uL  Comprehensive metabolic panel  Result Value Ref Range   Sodium 136 135 - 145 mmol/L   Potassium 4.2 3.5 - 5.1 mmol/L   Chloride 103 96 - 112 mmol/L   CO2 28 19 - 32 mmol/L   Glucose, Bld 143 (H) 70 - 99 mg/dL   BUN 20 6 - 23 mg/dL   Creatinine, Ser 1.24 0.50 - 1.35 mg/dL   Calcium 9.8 8.4 - 10.5 mg/dL   Total Protein 7.5 6.0 - 8.3 g/dL   Albumin 3.9 3.5 - 5.2 g/dL   AST 26 0 - 37 U/L   ALT 36 0 - 53 U/L  Alkaline Phosphatase 106 39 - 117 U/L   Total Bilirubin 0.4 0.3 - 1.2 mg/dL   GFR calc non Af Amer 68 (L) >90 mL/min   GFR calc Af Amer 79 (L) >90 mL/min   Anion gap 5 5 - 15  Lipase, blood  Result Value Ref Range   Lipase 28 11 - 59 U/L  Urinalysis, Routine w reflex microscopic  Result Value Ref Range   Color, Urine YELLOW YELLOW   APPearance CLEAR CLEAR   Specific Gravity, Urine 1.022 1.005 - 1.030   pH 5.5 5.0 - 8.0   Glucose, UA NEGATIVE NEGATIVE mg/dL   Hgb urine dipstick NEGATIVE NEGATIVE   Bilirubin Urine NEGATIVE NEGATIVE   Ketones, ur NEGATIVE NEGATIVE mg/dL   Protein, ur NEGATIVE NEGATIVE mg/dL   Urobilinogen, UA 0.2 0.0 - 1.0 mg/dL   Nitrite NEGATIVE NEGATIVE   Leukocytes, UA NEGATIVE NEGATIVE  Ethanol  Result Value Ref Range   Alcohol, Ethyl (B) <5 0 - 9 mg/dL  Urine rapid drug screen (hosp performed)  Result Value Ref Range   Opiates NONE DETECTED NONE DETECTED   Cocaine NONE DETECTED NONE DETECTED   Benzodiazepines NONE DETECTED NONE DETECTED   Amphetamines NONE DETECTED NONE DETECTED   Tetrahydrocannabinol NONE DETECTED NONE DETECTED   Barbiturates NONE DETECTED NONE DETECTED  Troponin I  Result Value Ref Range   Troponin I <0.03 <0.031 ng/mL    Labs Review Labs Reviewed  CBC WITH DIFFERENTIAL/PLATELET - Abnormal;  Notable for the following:    WBC 11.0 (*)    All other components within normal limits  COMPREHENSIVE METABOLIC PANEL - Abnormal; Notable for the following:    Glucose, Bld 143 (*)    GFR calc non Af Amer 68 (*)    GFR calc Af Amer 79 (*)    All other components within normal limits  LIPASE, BLOOD  URINALYSIS, ROUTINE W REFLEX MICROSCOPIC  ETHANOL  URINE RAPID DRUG SCREEN (HOSP PERFORMED)  TROPONIN I    Imaging Review Ct Abdomen Pelvis W Contrast  08/27/2014   CLINICAL DATA:  Multiple abdominal surgeries.  Abdominal pain.  EXAM: CT ABDOMEN AND PELVIS WITH CONTRAST  TECHNIQUE: Multidetector CT imaging of the abdomen and pelvis was performed using the standard protocol following bolus administration of intravenous contrast.  CONTRAST:  49mL OMNIPAQUE IOHEXOL 300 MG/ML  SOLN  COMPARISON:  08/13/13  FINDINGS: Lower chest: Lingular volume loss. Normal heart size without pericardial or pleural effusion.  Hepatobiliary: Mild hepatic steatosis suspected. No focal liver lesion. Normal gallbladder, without biliary ductal dilatation.  Pancreas: Normal, without mass or ductal dilatation.  Spleen: Normal  Adrenals/Urinary Tract: Normal adrenal glands. Lower pole left renal collecting system 7 mm stone. No hydronephrosis. Normal urinary bladder.  Stomach/Bowel: Normal stomach, without wall thickening. Surgical changes about the rectosigmoid junction. Presacral fascia thickening is likely secondary and decreased. Normal terminal ileum.  A left paracentral ventral abdominal wall hernia contains nonobstructive small bowel, including on image 55. This is new. At the same level, a right paracentral wall hernia contains nonobstructive small bowel. No strangulation at either site.  Vascular/Lymphatic: Aortic and branch vessel atherosclerosis. A right external iliac node is minimally enlarged 11 mm on image 81. This is decreased from 1.4 cm on the prior exam and therefore likely reactive. Left external iliac node is also  11 mm and decreased since the prior.  Reproductive: Normal prostate.  Other: Fat containing left inguinal hernia.  Small left hydrocele.  Musculoskeletal: No acute osseous abnormality.  IMPRESSION: 1.  Development of bilateral small bowel containing ventral abdominal wall hernias. No obstruction or other acute complication. 2. Left nephrolithiasis. 3. Improved pelvic adenopathy, arguing for a reactive etiology. If any history of primary malignancy, consider followup with CT at 3 months.   Electronically Signed   By: Abigail Miyamoto M.D.   On: 08/27/2014 21:38     EKG Interpretation   Date/Time:  Friday August 27 2014 19:45:38 EDT Ventricular Rate:  104 PR Interval:  152 QRS Duration: 87 QT Interval:  340 QTC Calculation: 447 R Axis:   118 Text Interpretation:  Sinus tachycardia Otherwise within normal limits  When compared with ECG of 11/05/2012, No significant change was found  Confirmed by [facility]  MD, DAVID ([id number]) on 08/27/2014 7:52:29 PM       10:58 PM Patient seen and assessed by attending physician, Dr. Peggye Pitt. Agreed to plan of discharge.   11:43 PM This provider re-checked patient's pulse ox. Patient reported that he does have history of sleep apnea - stated that he was due to get a work up, but did not. Sitting upright and speaking with this provider without difficulty, pulse ox 97% on room air.   MDM   Final diagnoses:  Ventral hernia without obstruction or gangrene  Generalized abdominal pain    Medications  morphine 4 MG/ML injection 4 mg (4 mg Intravenous Given 08/27/14 1920)  iohexol (OMNIPAQUE) 300 MG/ML solution 25 mL (25 mLs Oral Contrast Given 08/27/14 1901)  iohexol (OMNIPAQUE) 300 MG/ML solution 80 mL (80 mLs Intravenous Contrast Given 08/27/14 2046)    Filed Vitals:   08/27/14 2215 08/27/14 2230 08/27/14 2245 08/27/14 2300  BP: 124/79 128/73 113/90 124/85  Pulse: 94 93 98 99  Temp:      TempSrc:      Resp: 15 20 18 17   Height:      Weight:      SpO2: 92% 93% 91%  90%   EKG noted sinus tachycardia with a heart rate 104 bpm, otherwise unremarkable-no change since previous. Troponin negative elevation. CBC highly elevated leukocytosis of 11.0. Hemoglobin 16.8, hematocrit 48.0. CMP unremarkable-glucose of 143 with negative elevated anion gap. Lipase negative elevation. Ethanol negative elevation. Urinalysis negative for infection-negative nitrites, leukocytes, hemoglobin. Urine drug screen negative. CT abdomen and pelvis with contrast noted development of bilateral small bowel containing ventral abdominal wall hernia with no obstruction or acute abnormality. Improved pelvic adenopathy. Negative findings of acute infection identified on CT, negative acute abdomen noted. Ventral hernia identified with negative findings of incarceration. Patient seen and assessed by attending physician, Dr. Roxanne Mins who agrees to plan of discharge. Patient stable, afebrile. Patient not septic appearing. Negative signs of respiratory distress. Pain controlled in ED setting. Discharged patient. [facility] in [place] surgery. Discussed with patient to rest and stay hydrated. Discussed with patient to avoid any physical or strenuous activity. Discussed with patient to closely monitor symptoms and if symptoms are to worsen or change to report back to the ED - strict return instructions given.  Patient agreed to plan of care, understood, all questions answered.   Jamse Mead, PA-C 08/27/14 [id number] PA-C 43/15/40 [id number]  Delora Fuel, MD 61/95/09 [id number]

## 2014-08-27 NOTE — ED Notes (Signed)
Returned from CT.

## 2014-08-27 NOTE — ED Notes (Signed)
Lower mid abdomen has several old surgical markings with purulent drainage coming from two of the sites.

## 2014-08-27 NOTE — ED Notes (Signed)
Transported to CT 

## 2014-08-27 NOTE — ED Provider Notes (Signed)
CSN: [id number]     Arrival date & time 08/27/14  1159 History   First MD Initiated Contact with Patient 08/27/14 1320     Chief Complaint  Patient presents with  . Hernia   (Consider location/radiation/quality/duration/timing/severity/associated sxs/prior Treatment) Patient is a 49 y.o. male presenting with abdominal pain. The history is provided by the patient.  Abdominal Pain Pain location:  Periumbilical Pain quality: sharp and stabbing   Pain radiates to:  Does not radiate Pain severity:  Moderate Onset quality:  Sudden Duration:  2 weeks Progression:  Worsening Chronicity:  Recurrent Context comment:  S/p diverticular surg with mult abd hernias ,right upper area worse in past 2 weeks with drainage from central area. Associated symptoms: no fever   Risk factors: multiple surgeries and obesity     Past Medical History  Diagnosis Date  . Depression   . Alcoholic   . Hypertension   . Renal disorder   . Respiratory failure   . CHF (congestive heart failure)   . Perforation bowel   . PNA (pneumonia)   . MRSA (methicillin resistant Staphylococcus aureus)   . Renal failure    Past Surgical History  Procedure Laterality Date  . Abdominal surgery    . Cardiac surgery    . Peripherally inserted central catheter insertion    . Ileostomy     History reviewed. No pertinent family history. History  Substance Use Topics  . Smoking status: Current Every Day Smoker -- 1.50 packs/day for 28 years    Types: Cigarettes  . Smokeless tobacco: Never Used  . Alcohol Use: Yes     Comment:  12 pack of beer/week    Review of Systems  Constitutional: Negative for fever.  Gastrointestinal: Positive for abdominal pain.    Allergies  Bee venom  Home Medications   Prior to Admission medications   Medication Sig Start Date End Date Taking? Authorizing Provider  HYDROcodone-acetaminophen (NORCO/VICODIN) 5-325 MG per tablet Take 2 tablets by mouth every 4 (four) hours as needed.  08/13/13   Montine Circle, PA-C   There were no vitals taken for this visit. Physical Exam  Constitutional: He is oriented to person, place, and time. He appears well-developed and well-nourished. He appears distressed.  Cardiovascular: Regular rhythm and normal heart sounds.   Pulmonary/Chest: Effort normal and breath sounds normal.  Abdominal: Soft. Bowel sounds are normal. He exhibits distension and mass. There is tenderness. There is no rebound and no guarding.  Mult surg scars with mult hernias, central drainage.  Neurological: He is alert and oriented to person, place, and time.  Skin: Skin is warm and dry.  Nursing note and vitals reviewed.   ED Course  Procedures (including critical care time) Labs Review Labs Reviewed - No data to display  Imaging Review No results found.   MDM   1. Abdominal pain not caused by trauma    Sent for surgical eval of mult recurrent ventral abd hernias with draining cellulitis.  Billy Fischer, MD 08/27/14 1340

## 2014-08-27 NOTE — ED Notes (Signed)
Called for pt in lobby, no answer.

## 2014-08-27 NOTE — ED Notes (Signed)
Visitor leaving.

## 2014-08-27 NOTE — ED Notes (Signed)
Pt reports    Symptoms      Of      abd  Pain    Drainage          From  The   Wound         Symptoms  X   2  Weeks         Pt  Has    Old  Hernia  Present  With drainage  From the  Site      Pt  Also  Reports  Many  Health  Issues     And  Is not  Taking  Any meds  For his  bp  Either     He  Is  Wearing a  Hernia  Binder     At this   Time    He  Is  Sitting  Upright on the  Exam table  Speaking in  Complete  sentances

## 2014-08-27 NOTE — ED Notes (Signed)
Pt  Was  Advised  To  Remain npo

## 2014-08-27 NOTE — ED Notes (Signed)
Pt states he is in constant abdominal pain and would like to have a consult with a surgeon in order to correct what he believes is a hernia.

## 2014-11-24 ENCOUNTER — Other Ambulatory Visit: Payer: Self-pay

## 2014-11-24 ENCOUNTER — Ambulatory Visit (INDEPENDENT_AMBULATORY_CARE_PROVIDER_SITE_OTHER): Payer: PPO | Admitting: Family Medicine

## 2014-11-24 VITALS — BP 136/93 | HR 113 | Temp 97.7°F | Resp 16 | Ht 69.0 in | Wt 220.0 lb

## 2014-11-24 DIAGNOSIS — Z0000 Encounter for general adult medical examination without abnormal findings: Secondary | ICD-10-CM

## 2014-11-24 LAB — CBC WITH DIFFERENTIAL/PLATELET
BASOS ABS: 0.1 10*3/uL (ref 0.0–0.1)
Basophils Relative: 1 % (ref 0–1)
EOS PCT: 4 % (ref 0–5)
Eosinophils Absolute: 0.4 10*3/uL (ref 0.0–0.7)
HCT: 47.2 % (ref 39.0–52.0)
Hemoglobin: 16.5 g/dL (ref 13.0–17.0)
Lymphocytes Relative: 22 % (ref 12–46)
Lymphs Abs: 2.4 10*3/uL (ref 0.7–4.0)
MCH: 31 pg (ref 26.0–34.0)
MCHC: 35 g/dL (ref 30.0–36.0)
MCV: 88.7 fL (ref 78.0–100.0)
MONO ABS: 0.7 10*3/uL (ref 0.1–1.0)
MPV: 9.9 fL (ref 8.6–12.4)
Monocytes Relative: 6 % (ref 3–12)
Neutro Abs: 7.3 10*3/uL (ref 1.7–7.7)
Neutrophils Relative %: 67 % (ref 43–77)
PLATELETS: 314 10*3/uL (ref 150–400)
RBC: 5.32 MIL/uL (ref 4.22–5.81)
RDW: 13.2 % (ref 11.5–15.5)
WBC: 10.9 10*3/uL — AB (ref 4.0–10.5)

## 2014-11-24 MED ORDER — ACETAMINOPHEN-CODEINE #3 300-30 MG PO TABS: 1.0000 | ORAL_TABLET | ORAL | Status: DC | PRN

## 2014-11-24 MED ORDER — SULFAMETHOXAZOLE-TRIMETHOPRIM 800-160 MG PO TABS: 1.0000 | ORAL_TABLET | Freq: Two times a day (BID) | ORAL | Status: DC

## 2014-11-24 NOTE — Patient Instructions (Signed)
1. Take Tylenol with codiene as sparingly as possible. 2. Request patches for smoking cessation 3. Follow-up with surgeon in [place] as planned 4. Follow-up if abd tissue infections does not improve.

## 2014-11-24 NOTE — Telephone Encounter (Signed)
Sent in bactrim to correct pharmacy. Thanks!

## 2014-11-24 NOTE — Progress Notes (Signed)
Patient ID: Lucas Small, male   DOB: 1966/03/03, 49 y.o.   MRN: [id number]   Sylvanus Telford, is a 49 y.o. male  [id number] had  [id number]  DOB - 02-21-1966  CC:  Chief Complaint  Patient presents with  . Establish Care    pain in abdomen        HPI:Lucas Small is a 49 y.o. male here today to establish medical care.His major concerns are:  He would like patches to help him stop smoking, He needs pain medication for abd pain caused by multiple hernias resulting from many adb  Surgeries, and he wants an antibiotic for infections of surgical site.  He has a history of Alcohol dependency but says he has not had a drink for 4 + months. He has a history of depression most likely related to substance abuse. He is currently on no medications for depression and feels he is doing well in that regard.  He has a history of diverticulitis and has multiple abd surgeries. One of those resulted in sepsis in 2013. He was in hospital for several weeks and was in coma for 14 weeks. He spent quite a while in nursing home after that. His last hospitalization was in 01/2013.  He is scheduled to see a surgeon in [place] about surgery for his multiple hernias.  He also gets frequently external infections of abdominal scars. He has had both a colonoscopy and ileostomy  Allergies  Allergen Reactions  . Bee Venom Anaphylaxis   Past Medical History  Diagnosis Date  . Depression   . Alcoholic   . Hypertension   . Renal disorder   . Respiratory failure   . CHF (congestive heart failure)   . Perforation bowel   . PNA (pneumonia)   . MRSA (methicillin resistant Staphylococcus aureus)   . Renal failure    Current Outpatient Prescriptions on File Prior to Visit  Medication Sig Dispense Refill  . HYDROcodone-acetaminophen (NORCO/VICODIN) 5-325 MG per tablet Take 1 tablet by mouth every 6 (six) hours as needed for severe pain. (Patient not taking: Reported on 11/24/2014) 9 tablet 0   Current  Facility-Administered Medications on File Prior to Visit  Medication Dose Route Frequency Provider Last Rate Last Dose  . folic acid (FOLVITE) tablet 1 mg  1 mg Oral Daily Shuvon B Rankin, NP      . haloperidol (HALDOL) tablet 5 mg  5 mg Oral Q6H PRN Shuvon B Rankin, NP      . LORazepam (ATIVAN) tablet 1 mg  1 mg Oral Q8H PRN Shuvon B Rankin, NP      . magnesium hydroxide (MILK OF MAGNESIA) suspension 30 mL  30 mL Oral Daily PRN Shuvon B Rankin, NP      . multivitamin with minerals tablet 1 tablet  1 tablet Oral Daily Shuvon B Rankin, NP       No family history on file. History   Social History  . Marital Status: Divorced    Spouse Name: N/A  . Number of Children: N/A  . Years of Education: N/A   Occupational History  . Not on file.   Social History Main Topics  . Smoking status: Current Every Day Smoker -- 1.50 packs/day for 28 years    Types: Cigarettes  . Smokeless tobacco: Never Used  . Alcohol Use: Yes     Comment:  12 pack of beer/week  . Drug Use: No     Comment: THC cocaine  . Sexual Activity: Yes  Comment: Not asked   Other Topics Concern  . Not on file   Social History Narrative    Review of Systems: Constitutional: Negative for fever, chills, appetite change, weight loss,  fatigue. HENT: Negative for ear pain, ear discharge.nose bleeds Eyes: Negative for pain, discharge, redness, itching and visual disturbance.Positive for age related decrease in vision Neck: Positive  for pain, stiffness Respiratory: Positive  for cough, shortness of breath, possibily related to smoking. He reports being told he had CHF but I have no documentation of that. Cardiovascular: Negative for chest pain, palpitations and leg swelling. Gastrointestinal: Positive for abdominal distention, abdominal pain pccassional  Nausea and diarrhea Negative for vomiting, constipations Genitourinary: Negative for dysuria, urgency, frequency, hematuria, flank pain,  Musculoskeletal: Positive for  back pain, joint pain, joint  swelling, arthralgia and gait problem.Negative for weakness. Neurological: Negative for dizziness, tremors, seizures, syncope,   light-headedness, numbness and headaches.  Hematological: Negative for easy bruising or bleeding Psychiatric/Behavioral: Negative for depression, anxiety, decreased concentration, confusion   Objective:   Filed Vitals:   11/24/14 1428  BP: 136/93  Pulse: 113  Temp: 97.7 F (36.5 C)  Resp: 16    Physical Exam: Constitutional: Patient appears well-developed and well-nourished. No distress. HENT: Normocephalic, atraumatic, External right and left ear normal. Oropharynx is clear and moist.  Eyes: Conjunctivae and EOM are normal. PERRLA, no scleral icterus. Neck: Normal ROM. Neck supple. No lymphadenopathy, No thyromegaly. CVS: RRR, S1/S2 +, no murmurs, no gallops, no rubs Pulmonary: Effort and breath sounds normal, no stridor, rhonchi, wheezes, rales.  Abdominal: Shows multiple surgical scars that are lumpy bumpy, abdomen shows generalized tenderness and multiple hernias. There is erythema and purulent drainage from one surgical scar just below the umbilicus. There is decreased muscle tone of the abdomen. Musculoskeletal: Normal range of motion. No edema and no tenderness.  Neuro: Alert.Normal muscle tone coordination. Non-focal Skin: Skin is warm and dry. No rash noted. Not diaphoretic. No erythema. No pallor. Psychiatric: Normal mood and affect. Behavior, judgment, thought content normal.  Lab Results  Component Value Date   WBC 11.0* 08/27/2014   HGB 16.8 08/27/2014   HCT 48.0 08/27/2014   MCV 90.2 08/27/2014   PLT 301 08/27/2014   Lab Results  Component Value Date   CREATININE 1.24 08/27/2014   BUN 20 08/27/2014   NA 136 08/27/2014   K 4.2 08/27/2014   CL 103 08/27/2014   CO2 28 08/27/2014    No results found for: HGBA1C Lipid Panel  No results found for: CHOL, TRIG, HDL, CHOLHDL, VLDL, LDLCALC      Assessment and plan:   Abdominal Pain from hernias and multiple abd surgeries - Tylenol # 3, #30, one po q 8 hrs prn pain - follow-up with surgeon as planned  Skin Infection of surgical site -Septra DS #20, one po bid for 10 days -warm conpresses -Follow-up if not improving or worsening.   Tobacco abuse - Encouraged to to attempt smoking cessation - to follow-up when he is ready for assistance  -Alcohol Abuse - He states no alcohol in 4 + months.  History of Renal failure - CMet with GFR  Health Maintenance -Cmet with GFR, CBC, lipid panel.    Follow-up in three months.  The patient was given clear instructions to go to ER or return to medical center if symptoms don't improve, worsen or new problems develop. The patient verbalized understanding. The patient was told to call to get lab results if they haven't heard anything in the  next week.        Micheline Chapman, MSN, FNP-B 11/24/2014, 2:43 PM

## 2014-11-25 LAB — COMPLETE METABOLIC PANEL WITH GFR
ALBUMIN: 4.3 g/dL (ref 3.5–5.2)
ALT: 25 U/L (ref 0–53)
AST: 16 U/L (ref 0–37)
Alkaline Phosphatase: 87 U/L (ref 39–117)
BILIRUBIN TOTAL: 0.5 mg/dL (ref 0.2–1.2)
BUN: 15 mg/dL (ref 6–23)
CALCIUM: 9.4 mg/dL (ref 8.4–10.5)
CHLORIDE: 106 meq/L (ref 96–112)
CO2: 21 meq/L (ref 19–32)
CREATININE: 0.96 mg/dL (ref 0.50–1.35)
GLUCOSE: 94 mg/dL (ref 70–99)
Potassium: 4.1 mEq/L (ref 3.5–5.3)
Sodium: 140 mEq/L (ref 135–145)
TOTAL PROTEIN: 7.2 g/dL (ref 6.0–8.3)

## 2014-11-25 LAB — LIPID PANEL
CHOLESTEROL: 210 mg/dL — AB (ref 0–200)
HDL: 30 mg/dL — ABNORMAL LOW (ref >=40)
LDL Cholesterol: 157 mg/dL — ABNORMAL HIGH (ref 0–99)
Total CHOL/HDL Ratio: 7 Ratio
Triglycerides: 115 mg/dL (ref <150)
VLDL: 23 mg/dL (ref 0–40)

## 2014-11-30 ENCOUNTER — Encounter: Payer: Self-pay | Admitting: Family Medicine

## 2014-12-22 ENCOUNTER — Other Ambulatory Visit (HOSPITAL_COMMUNITY): Payer: Self-pay | Admitting: Surgery

## 2014-12-22 DIAGNOSIS — K439 Ventral hernia without obstruction or gangrene: Secondary | ICD-10-CM

## 2014-12-30 ENCOUNTER — Ambulatory Visit (HOSPITAL_COMMUNITY): Payer: PPO

## 2015-01-04 ENCOUNTER — Ambulatory Visit (HOSPITAL_COMMUNITY)
Admission: RE | Admit: 2015-01-04 | Discharge: 2015-01-04 | Disposition: A | Discharge status: Home / Self Care | Payer: PPO | Source: Ambulatory Visit | Attending: Surgery | Admitting: Surgery

## 2015-01-04 ENCOUNTER — Encounter (HOSPITAL_COMMUNITY): Payer: Self-pay

## 2015-01-04 DIAGNOSIS — K469 Unspecified abdominal hernia without obstruction or gangrene: Secondary | ICD-10-CM | POA: Insufficient documentation

## 2015-01-04 DIAGNOSIS — K439 Ventral hernia without obstruction or gangrene: Secondary | ICD-10-CM | POA: Diagnosis present

## 2015-01-04 DIAGNOSIS — K5730 Diverticulosis of large intestine without perforation or abscess without bleeding: Secondary | ICD-10-CM | POA: Diagnosis not present

## 2015-01-04 DIAGNOSIS — N200 Calculus of kidney: Secondary | ICD-10-CM | POA: Insufficient documentation

## 2015-01-04 HISTORY — DX: Sickle-cell disease without crisis: D57.1

## 2015-01-04 HISTORY — DX: Disorder of kidney and ureter, unspecified: N28.9

## 2015-01-04 HISTORY — DX: Type 2 diabetes mellitus without complications: E11.9

## 2015-01-04 HISTORY — DX: Multiple myeloma not having achieved remission: C90.00

## 2015-01-04 MED ORDER — IOHEXOL 300 MG/ML  SOLN
100.0000 mL | Freq: Once | INTRAMUSCULAR | Status: AC | PRN
  Administered 2015-01-04: 100 mL via INTRAVENOUS

## 2015-04-14 ENCOUNTER — Ambulatory Visit (INDEPENDENT_AMBULATORY_CARE_PROVIDER_SITE_OTHER): Payer: PPO | Admitting: Family Medicine

## 2015-04-14 ENCOUNTER — Encounter: Payer: Self-pay | Admitting: Family Medicine

## 2015-04-14 VITALS — Ht 69.0 in | Wt 208.0 lb

## 2015-04-14 DIAGNOSIS — G4730 Sleep apnea, unspecified: Secondary | ICD-10-CM

## 2015-04-14 DIAGNOSIS — Z0000 Encounter for general adult medical examination without abnormal findings: Secondary | ICD-10-CM

## 2015-04-14 DIAGNOSIS — N509 Disorder of male genital organs, unspecified: Secondary | ICD-10-CM

## 2015-04-14 LAB — CBC WITH DIFFERENTIAL/PLATELET
BASOS PCT: 1 % (ref 0–1)
Basophils Absolute: 0.1 10*3/uL (ref 0.0–0.1)
EOS ABS: 0.6 10*3/uL (ref 0.0–0.7)
Eosinophils Relative: 5 % (ref 0–5)
HCT: 46.3 % (ref 39.0–52.0)
HEMOGLOBIN: 15.6 g/dL (ref 13.0–17.0)
Lymphocytes Relative: 22 % (ref 12–46)
Lymphs Abs: 2.6 10*3/uL (ref 0.7–4.0)
MCH: 29.8 pg (ref 26.0–34.0)
MCHC: 33.7 g/dL (ref 30.0–36.0)
MCV: 88.5 fL (ref 78.0–100.0)
MPV: 10.3 fL (ref 8.6–12.4)
Monocytes Absolute: 0.8 10*3/uL (ref 0.1–1.0)
Monocytes Relative: 7 % (ref 3–12)
NEUTROS ABS: 7.6 10*3/uL (ref 1.7–7.7)
NEUTROS PCT: 65 % (ref 43–77)
PLATELETS: 299 10*3/uL (ref 150–400)
RBC: 5.23 MIL/uL (ref 4.22–5.81)
RDW: 13.1 % (ref 11.5–15.5)
WBC: 11.7 10*3/uL — AB (ref 4.0–10.5)

## 2015-04-14 LAB — LIPID PANEL
CHOLESTEROL: 191 mg/dL (ref 125–200)
HDL: 29 mg/dL — ABNORMAL LOW (ref >=40)
LDL Cholesterol: 120 mg/dL (ref <130)
TRIGLYCERIDES: 211 mg/dL — AB (ref <150)
Total CHOL/HDL Ratio: 6.6 Ratio — ABNORMAL HIGH (ref <=5.0)
VLDL: 42 mg/dL — AB (ref <30)

## 2015-04-14 LAB — COMPLETE METABOLIC PANEL WITH GFR
ALBUMIN: 4.6 g/dL (ref 3.6–5.1)
ALK PHOS: 98 U/L (ref 40–115)
ALT: 23 U/L (ref 9–46)
AST: 20 U/L (ref 10–40)
BUN: 21 mg/dL (ref 7–25)
CALCIUM: 10 mg/dL (ref 8.6–10.3)
CO2: 21 mmol/L (ref 20–31)
Chloride: 101 mmol/L (ref 98–110)
Creat: 0.99 mg/dL (ref 0.60–1.35)
GFR, Est African American: >89 mL/min (ref >=60)
Glucose, Bld: 121 mg/dL — ABNORMAL HIGH (ref 65–99)
POTASSIUM: 4.3 mmol/L (ref 3.5–5.3)
Sodium: 136 mmol/L (ref 135–146)
Total Bilirubin: 0.5 mg/dL (ref 0.2–1.2)
Total Protein: 7.4 g/dL (ref 6.1–8.1)

## 2015-04-14 LAB — HIV ANTIBODY (ROUTINE TESTING W REFLEX): HIV 1&2 Ab, 4th Generation: NONREACTIVE

## 2015-04-14 LAB — TSH: TSH: 1.114 u[IU]/mL (ref 0.350–4.500)

## 2015-04-14 NOTE — Progress Notes (Signed)
Patient ID: Lucas Small, male   DOB: 1965-11-02, 49 y.o.   MRN: [id number]   Pavel Gadd, is a 49 y.o. male  [id number]  [id number]  DOB - 05-01-1966  CC:  Chief Complaint  Patient presents with  . Follow-up    multi issues.       HPI: Lucas Small is a 49 y.o. male here for follow-up. He has has history of multiple abdominal hernia and surgeries and had surgery in [place] in the last couple of months. He is here specifically today requesting referrals. He was told while in the hospital for the surgery that he probable has sleep apnea and a sleep study was recommended. 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 His other concern is that he was given insulin while in the hospital and wonders if he has developed diabetes. Other than his surgical incision, which is healing by third intention, he has no other complaints today.  He has a wealth of chronic disorders including alcoholosim, depression, hypertension, renal disorder, CHF. He has a history of respiratory failure at some point an MRS  Allergies  Allergen Reactions  . Bee Venom Anaphylaxis   Past Medical History  Diagnosis Date  . Depression   . Alcoholic   . Hypertension   . Renal disorder   . Respiratory failure   . CHF (congestive heart failure)   . Perforation bowel   . PNA (pneumonia)   . MRSA (methicillin resistant Staphylococcus aureus)   . Renal failure   . Diabetes mellitus without complication   . Multiple myeloma   . Renal insufficiency   . Sickle cell anemia    Current Outpatient Prescriptions on File Prior to Visit  Medication Sig Dispense Refill  . acetaminophen-codeine (TYLENOL #3) 300-30 MG per tablet Take 1-2 tablets by mouth every 4 (four) hours as needed for moderate pain. (Patient not taking: Reported on 04/14/2015) 30 tablet 0  . HYDROcodone-acetaminophen (NORCO/VICODIN) 5-325 MG per tablet Take 1 tablet by mouth every 6 (six)  hours as needed for severe pain. (Patient not taking: Reported on 11/24/2014) 9 tablet 0  . sulfamethoxazole-trimethoprim (BACTRIM DS,SEPTRA DS) 800-160 MG per tablet Take 1 tablet by mouth 2 (two) times daily. (Patient not taking: Reported on 04/14/2015) 20 tablet 0   Current Facility-Administered Medications on File Prior to Visit  Medication Dose Route Frequency Provider Last Rate Last Dose  . folic acid (FOLVITE) tablet 1 mg  1 mg Oral Daily Shuvon B Rankin, NP      . haloperidol (HALDOL) tablet 5 mg  5 mg Oral Q6H PRN Shuvon B Rankin, NP      . LORazepam (ATIVAN) tablet 1 mg  1 mg Oral Q8H PRN Shuvon B Rankin, NP      . magnesium hydroxide (MILK OF MAGNESIA) suspension 30 mL  30 mL Oral Daily PRN Shuvon B Rankin, NP      . multivitamin with minerals tablet 1 tablet  1 tablet Oral Daily Shuvon B Rankin, NP       No family history on file. Social History   Social History  . Marital Status: Divorced    Spouse Name: N/A  . Number of Children: N/A  . Years of Education: N/A   Occupational History  . Not on file.   Social History Main Topics  . Smoking status: Current Every Day Smoker -- 1.50 packs/day for 28 years    Types: Cigarettes  . Smokeless tobacco: Never  Used  . Alcohol Use: Yes     Comment:  12 pack of beer/week  . Drug Use: No     Comment: THC cocaine  . Sexual Activity: Yes     Comment: Not asked   Other Topics Concern  . Not on file   Social History Narrative    Review of Systems: Constitutional: Negative for fever, chills, appetite change, weight loss,  Fatigue. Skin: Negative for rashes or lesions of concern. HENT: Negative for ear pain, ear discharge.nose bleeds Eyes: Negative for pain, discharge, redness, itching and visual disturbance. Neck: Negative for pain, stiffness Respiratory: Negative for cough, shortness of breath. Positive for possible sleep apnea. Cardiovascular: Negative for chest pain, palpitations and leg swelling. Gastrointestinal:  Negative for abdominal pain, nausea, vomiting, diarrhea. Positive for surgical incision discomfort. Positive for constipation related to current opoid use for pain. Genitourinary: Negative for dysuria, urgency, frequency, hematuria,  Musculoskeletal: Negative for joint pain, joint  swelling, and gait problem.Negative for weakness.Positive for minor back pain. Neurological: Negative for tremors, seizures, syncope,   light-headedness, numbness and headaches. Positive for occassional dizziness with head movement.  Hematological: Negative for easy bruising or bleeding Psychiatric/Behavioral: Negative for depression, anxiety, decreased concentration, confusion   Objective:  There were no vitals filed for this visit.  Physical Exam: Constitutional: Patient appears well-developed and well-nourished. No distress. HENT: Normocephalic, atraumatic, External right and left ear normal. Oropharynx is clear and moist.  Eyes: Conjunctivae and EOM are normal. PERRLA, no scleral icterus. Neck: Normal ROM. Neck supple. No lymphadenopathy, No thyromegaly. CVS: RRR, S1/S2 +, no murmurs, no gallops, no rubs Pulmonary: Effort and breath sounds normal, no stridor, rhonchi, wheezes, rales.  Abdominal: Soft. Normoactive BS,, no distension, tenderness, rebound or guarding. There is a healing surgical scar w/o signs of infection. Musculoskeletal: Normal range of motion. No edema and no tenderness.  Neuro: Alert.Normal muscle tone coordination. Non-focal Skin: Skin is warm and dry. No rash noted. Not diaphoretic. No erythema. No pallor. Psychiatric: Normal mood and affect. Behavior, judgment, thought content normal.  Lab Results  Component Value Date   WBC 10.9* 11/24/2014   HGB 16.5 11/24/2014   HCT 47.2 11/24/2014   MCV 88.7 11/24/2014   PLT 314 11/24/2014   Lab Results  Component Value Date   CREATININE 0.96 11/24/2014   BUN 15 11/24/2014   NA 140 11/24/2014   K 4.1 11/24/2014   CL 106 11/24/2014   CO2  21 11/24/2014    No results found for: HGBA1C Lipid Panel     Component Value Date/Time   CHOL 210* 11/24/2014 1527   TRIG 115 11/24/2014 1527   HDL 30* 11/24/2014 1527   CHOLHDL 7.0 11/24/2014 1527   VLDL 23 11/24/2014 1527   LDLCALC 157* 11/24/2014 1527       Assessment and plan:   1. Health care maintenance  - COMPLETE METABOLIC PANEL WITH GFR - CBC with Differential - Lipid panel - TSH - Vitamin D 1,25 dihydroxy - HIV antibody (with reflex)  2. Sleep apnea  - Nocturnal polysomnography (NPSG); Future  3. Testicle trouble  - Ambulatory referral to Urology  4. Healing surgical incision -Continue to keep clean and dry. Follow-up with surgeon in [place].   No Follow-up on file.  The patient was given clear instructions to go to ER or return to medical center if symptoms don't improve, worsen or new problems develop. The patient verbalized understanding.    Micheline Chapman FNP  04/14/2015, 3:35 PM

## 2015-04-14 NOTE — Patient Instructions (Signed)
We will schedule a referral to urology and for a sleep study We will let you know if your bloodwork today indicates diabetes Follow-up with surgeon for any problems related to surgical incision. Follow-up in 6 months and as needed.

## 2015-04-17 LAB — VITAMIN D 1,25 DIHYDROXY
Vitamin D 1, 25 (OH)2 Total: 40 pg/mL (ref 18–72)
Vitamin D2 1, 25 (OH)2: <8 pg/mL
Vitamin D3 1, 25 (OH)2: 40 pg/mL

## 2015-04-27 ENCOUNTER — Telehealth: Payer: Self-pay | Admitting: Family Medicine

## 2015-04-27 NOTE — Telephone Encounter (Signed)
Patient calling to request urology referral.

## 2015-04-28 NOTE — Telephone Encounter (Signed)
Called patient and advised of appointment for [facility] on 06/23/2014 '@9'$ :30am. Thanks!

## 2015-06-24 DIAGNOSIS — N433 Hydrocele, unspecified: Secondary | ICD-10-CM | POA: Diagnosis not present

## 2015-06-24 DIAGNOSIS — N200 Calculus of kidney: Secondary | ICD-10-CM | POA: Diagnosis not present

## 2015-06-24 DIAGNOSIS — M545 Low back pain: Secondary | ICD-10-CM | POA: Diagnosis not present

## 2015-06-24 DIAGNOSIS — Z0000 Encounter for general adult medical examination without abnormal findings: Secondary | ICD-10-CM | POA: Diagnosis not present

## 2015-06-24 DIAGNOSIS — N5319 Other ejaculatory dysfunction: Secondary | ICD-10-CM | POA: Diagnosis not present

## 2015-07-04 ENCOUNTER — Ambulatory Visit (HOSPITAL_BASED_OUTPATIENT_CLINIC_OR_DEPARTMENT_OTHER): Payer: PPO | Attending: Family Medicine | Admitting: Radiology

## 2015-07-04 DIAGNOSIS — I493 Ventricular premature depolarization: Secondary | ICD-10-CM | POA: Insufficient documentation

## 2015-07-04 DIAGNOSIS — R5383 Other fatigue: Secondary | ICD-10-CM | POA: Insufficient documentation

## 2015-07-04 DIAGNOSIS — E119 Type 2 diabetes mellitus without complications: Secondary | ICD-10-CM | POA: Diagnosis not present

## 2015-07-04 DIAGNOSIS — R0683 Snoring: Secondary | ICD-10-CM | POA: Diagnosis not present

## 2015-07-04 DIAGNOSIS — G4733 Obstructive sleep apnea (adult) (pediatric): Secondary | ICD-10-CM | POA: Diagnosis not present

## 2015-07-04 DIAGNOSIS — I10 Essential (primary) hypertension: Secondary | ICD-10-CM | POA: Insufficient documentation

## 2015-07-04 DIAGNOSIS — G4730 Sleep apnea, unspecified: Secondary | ICD-10-CM | POA: Diagnosis not present

## 2015-07-09 DIAGNOSIS — G4730 Sleep apnea, unspecified: Secondary | ICD-10-CM | POA: Diagnosis not present

## 2015-07-09 NOTE — Progress Notes (Signed)
  Patient Name: Lucas Small, Lucas Small Date: 07/04/2015 Gender: Male D.O.B: 1966-03-21 Age (years): 49 Referring Provider: Micheline Chapman Height (inches): 69 Interpreting Physician: Baird Lyons MD, ABSM Weight (lbs): 198 RPSGT: Carolin Coy BMI: 29 MRN: [id number] Neck Size: 15.00 CLINICAL INFORMATION Sleep Study Type: NPSG Indication for sleep study: Diabetes, Fatigue, Hypertension, Snoring Epworth Sleepiness Score: 6  SLEEP STUDY TECHNIQUE As per the AASM Manual for the Scoring of Sleep and Associated Events v2.3 (April 2016) with a hypopnea requiring 4% desaturations. The channels recorded and monitored were frontal, central and occipital EEG, electrooculogram (EOG), submentalis EMG (chin), nasal and oral airflow, thoracic and abdominal wall motion, anterior tibialis EMG, snore microphone, electrocardiogram, and pulse oximetry.  MEDICATIONS Patient's medications include: charted for review Medications self-administered by patient during sleep study : ibuprofen for abdominal pain  SLEEP ARCHITECTURE The study was initiated at 10:55:14 PM and ended at 5:00:55 AM. Sleep onset time was 32.9 minutes and the sleep efficiency was 39.8%. The total sleep time was 145.5 minutes. Stage REM latency was N/A minutes. The patient spent 24.74% of the night in stage N1 sleep, 75.26% in stage N2 sleep, 0.00% in stage N3 and 0.00% in REM. Alpha intrusion was absent. Supine sleep was 48.11%. Wake after sleep onset 187 minutes  RESPIRATORY PARAMETERS The overall apnea/hypopnea index (AHI) was 34.6 per hour. There were 6 total apneas, including 4 obstructive, 2 central and 0 mixed apneas. There were 78 hypopneas and 50 RERAs. The AHI during Stage REM sleep was N/A per hour. AHI while supine was 48.0 per hour. The mean oxygen saturation was 92.07%. The minimum SpO2 during sleep was 86.00%. Moderate snoring was noted during this study.  CARDIAC DATA The 2 lead EKG demonstrated sinus  rhythm. The mean heart rate was 88.82 beats per minute. Other EKG findings include: PVCs.  LEG MOVEMENT DATA The total PLMS were 83 with a resulting PLMS index of 34.23. Associated arousal with leg movement index was 2.9   IMPRESSIONS - Severe obstructive sleep apnea occurred during this study (AHI = 34.6/h). - No significant central sleep apnea occurred during this study (CAI = 0.8/h). - Mild oxygen desaturation was noted during this study (Min O2 = 86.00%). - The patient snored with Moderate snoring volume. - EKG findings include PVCs.  DIAGNOSIS - Obstructive Sleep Apnea (327.23 [G47.33 ICD-10])  RECOMMENDATIONS - Therapeutic CPAP titration to determine optimal pressure required to alleviate sleep disordered breathing. - Positional therapy avoiding supine position during sleep. - Avoid alcohol, sedatives and other CNS depressants that may worsen sleep apnea and disrupt normal sleep architecture. - Sleep hygiene should be reviewed to assess factors that may improve sleep quality. - Weight management and regular exercise should be initiated or continued if appropriate.  Deneise Lever Diplomate, American Board of Sleep Medicine  ELECTRONICALLY SIGNED ON:  07/09/2015, 11:20 AM [place] PH: [phone]   FX: [phone] [place]

## 2015-07-11 ENCOUNTER — Ambulatory Visit (INDEPENDENT_AMBULATORY_CARE_PROVIDER_SITE_OTHER): Payer: PPO | Admitting: Family Medicine

## 2015-07-11 ENCOUNTER — Encounter: Payer: Self-pay | Admitting: Family Medicine

## 2015-07-11 VITALS — BP 152/102 | HR 96 | Temp 97.9°F | Ht 69.0 in | Wt 200.0 lb

## 2015-07-11 DIAGNOSIS — M545 Low back pain, unspecified: Secondary | ICD-10-CM

## 2015-07-11 DIAGNOSIS — G4700 Insomnia, unspecified: Secondary | ICD-10-CM | POA: Diagnosis not present

## 2015-07-11 DIAGNOSIS — R1084 Generalized abdominal pain: Secondary | ICD-10-CM

## 2015-07-11 DIAGNOSIS — L02219 Cutaneous abscess of trunk, unspecified: Secondary | ICD-10-CM

## 2015-07-11 DIAGNOSIS — L03319 Cellulitis of trunk, unspecified: Secondary | ICD-10-CM | POA: Diagnosis not present

## 2015-07-11 MED ORDER — NAPROXEN 500 MG PO TABS: 500.0000 mg | ORAL_TABLET | Freq: Two times a day (BID) | ORAL | Status: DC

## 2015-07-11 MED ORDER — SULFAMETHOXAZOLE-TRIMETHOPRIM 800-160 MG PO TABS: 1.0000 | ORAL_TABLET | Freq: Two times a day (BID) | ORAL | Status: DC

## 2015-07-11 MED ORDER — ACETAMINOPHEN-CODEINE #3 300-30 MG PO TABS: 1.0000 | ORAL_TABLET | ORAL | Status: DC | PRN

## 2015-07-11 MED ORDER — TRAZODONE HCL 50 MG PO TABS: 50.0000 mg | ORAL_TABLET | Freq: Every evening | ORAL | Status: DC | PRN

## 2015-07-11 NOTE — Patient Instructions (Signed)
Take naproxen with food twice a day Tylenol #3 only for severe pain trazadone for seelp Antibiotic for boil twice a day. Will order CT scan and let you know when to go.

## 2015-07-11 NOTE — Progress Notes (Signed)
Patient ID: Lucas Small, male   DOB: 13-Mar-1966, 50 y.o.   MRN: [id number]   Parthiv Mucci, is a 50 y.o. male  [id number]:[id number]  BJS:[id number]  DOB - [birth year]  CC:  Chief Complaint  Patient presents with  . muscle pain    compaints of muscle pain, pain in lower left back, and across abd at recent surgical site, also notes some abd cramping not sleeping recent sleep study,        HPI: Lucas Small is a 50 y.o. male with multiple complaints presents today complaining of abdominal pain, which he thinks may be related to recurrent hernia, left lower back pain for 2-3 weeks with no injury, a boil under his left arm which has been draining for a couple of weeks. He reports getting occassional boils but they usually resolve after draining. He also complains of insomnia. He mentions testicular pain and kidney stones. He is being followed by urology for these issues. He reports sleep apnea. He had a sleep study on 2-6 but I have not seen that report yet. He request medication for pain including Tylenol #3, which he has used in the past. He also request something for the boil and something for sleep.  He has used Trazadone in the past.  Allergies  Allergen Reactions  . Bee Venom Anaphylaxis   Past Medical History  Diagnosis Date  . Depression   . Alcoholic ([place])   . Hypertension   . Renal disorder   . Respiratory failure ([place])   . CHF (congestive heart failure) ([place])   . Perforation bowel ([place])   . PNA (pneumonia)   . MRSA (methicillin resistant Staphylococcus aureus)   . Renal failure   . Diabetes mellitus without complication ([place])   . Multiple myeloma ([place])   . Renal insufficiency   . Sickle cell anemia ([facility])    Current Outpatient Prescriptions on File Prior to Visit  Medication Sig Dispense Refill  . doxycycline (DORYX) 100 MG EC tablet Take 100 mg by mouth 2 (two) times daily. Reported on 07/11/2015    . gabapentin (NEURONTIN) 100 MG capsule Take 100 mg by mouth 3 (three)  times daily. Reported on 07/11/2015    . HYDROcodone-acetaminophen (NORCO/VICODIN) 5-325 MG per tablet Take 1 tablet by mouth every 6 (six) hours as needed for severe pain. (Patient not taking: Reported on 11/24/2014) 9 tablet 0  . oxycodone (OXY-IR) 5 MG capsule Take 5 mg by mouth every 4 (four) hours as needed. Reported on 07/11/2015    . pantoprazole (PROTONIX) 40 MG tablet Take 40 mg by mouth daily. Reported on 07/11/2015    . polyethylene glycol (MIRALAX / GLYCOLAX) packet Take 17 g by mouth daily. Reported on 07/11/2015    . senna (SENOKOT) 8.6 MG TABS tablet Take 1 tablet by mouth. Reported on 07/11/2015     Current Facility-Administered Medications on File Prior to Visit  Medication Dose Route Frequency Provider Last Rate Last Dose  . folic acid (FOLVITE) tablet 1 mg  1 mg Oral Daily Shuvon B Rankin, NP      . haloperidol (HALDOL) tablet 5 mg  5 mg Oral Q6H PRN Shuvon B Rankin, NP      . LORazepam (ATIVAN) tablet 1 mg  1 mg Oral Q8H PRN Shuvon B Rankin, NP      . magnesium hydroxide (MILK OF MAGNESIA) suspension 30 mL  30 mL Oral Daily PRN Shuvon B Rankin, NP      . multivitamin with minerals tablet 1  tablet  1 tablet Oral Daily Shuvon B Rankin, NP       History reviewed. No pertinent family history. Social History   Social History  . Marital Status: Divorced    Spouse Name: N/A  . Number of Children: N/A  . Years of Education: N/A   Occupational History  . Not on file.   Social History Main Topics  . Smoking status: Current Every Day Smoker -- 1.50 packs/day for 28 years    Types: Cigarettes  . Smokeless tobacco: Never Used  . Alcohol Use: No     Comment:  12 pack of beer/week  . Drug Use: No     Comment: THC cocaine  . Sexual Activity: Yes     Comment: Not asked   Other Topics Concern  . Not on file   Social History Narrative    Review of Systems: Constitutional: Negative for fever, chills, appetite change, weight loss,  Fatigue. Skin: Positive for boil under left  arm HENT: Negative for ear pain, ear discharge.nose bleeds Eyes: Negative for pain, discharge, redness, itching and visual disturbance. Neck: Negative for pain, stiffness Respiratory: Negative for cough, shortness of breath,   Cardiovascular: Negative for chest pain, palpitations and leg swelling. Gastrointestinal: Positive for abdominal pain, nausea, vomiting, diarrhea, constipations Genitourinary: Negative for dysuria, urgency, frequency, hematuria,  Musculoskeletal: Positive for back pain, joint pain, joint  swelling, and gait problem.Negative for weakness. Neurological: Negative for dizziness, tremors, seizures, syncope,   light-headedness, numbness and headaches.  Hematological: Negative for easy bruising or bleeding Psychiatric/Behavioral: Positive for depression, anxiety   Objective:   Filed Vitals:   07/11/15 0810  BP: 152/102  Pulse: 96  Temp: 97.9 F (36.6 C)    Physical Exam: Constitutional: Patient appears well-developed and well-nourished. No distress. HENT: Normocephalic, atraumatic, External right and left ear normal. Oropharynx is clear and moist.  Eyes: Conjunctivae and EOM are normal. PERRLA, no scleral icterus. Neck: Normal ROM. Neck supple. No lymphadenopathy, No thyromegaly. CVS: RRR, S1/S2 +, no murmurs, no gallops, no rubs Pulmonary: Effort and breath sounds normal, no stridor, rhonchi, wheezes, rales.  Abdominal: Normoactive bowel sounds. There are multiple deep scars from previous surgeries Musculoskeletal: Normal range of motion. No edema. There is tenderness to left lower back, below the CVA area. Neuro: Alert.Normal muscle tone coordination. Non-focal Skin: Skin is warm and dry. No rash noted. Not diaphoretic. No erythema. No pallor. There is a small drain boil under left axilla Psychiatric: Normal mood and affect. Behavior, judgment, thought content normal.  Lab Results  Component Value Date   WBC 11.7* 04/14/2015   HGB 15.6 04/14/2015   HCT 46.3  04/14/2015   MCV 88.5 04/14/2015   PLT 299 04/14/2015   Lab Results  Component Value Date   CREATININE 0.99 04/14/2015   BUN 21 04/14/2015   NA 136 04/14/2015   K 4.3 04/14/2015   CL 101 04/14/2015   CO2 21 04/14/2015    No results found for: HGBA1C Lipid Panel     Component Value Date/Time   CHOL 191 04/14/2015 1354   TRIG 211* 04/14/2015 1354   HDL 29* 04/14/2015 1354   CHOLHDL 6.6* 04/14/2015 1354   VLDL 42* 04/14/2015 1354   LDLCALC 120 04/14/2015 1354       Assessment and plan:   1. Left-sided low back pain without sciatica  - naproxen (NAPROSYN) 500 MG tablet; Take 1 tablet (500 mg total) by mouth 2 (two) times daily with a meal.  Dispense: 30 tablet; Refill:  0 - acetaminophen-codeine (TYLENOL #3) 300-30 MG tablet; Take 1-2 tablets by mouth every 4 (four) hours as needed for moderate pain.  Dispense: 30 tablet; Refill: 0  2. Cellulitis and abscess of trunk  - sulfamethoxazole-trimethoprim (BACTRIM DS,SEPTRA DS) 800-160 MG tablet; Take 1 tablet by mouth 2 (two) times daily.  Dispense: 20 tablet; Refill: 0  3. Insomnia  - traZODone (DESYREL) 50 MG tablet; Take 1 tablet (50 mg total) by mouth at bedtime as needed for sleep.  Dispense: 30 tablet; Refill: 3  4. Generalized abdominal pain -CT scan of abdomen and pelvis.  5. Elevated blood pressure -I failed to notice his blood pressure during his visit. Will call and have him return for a NV for a blood pressure check in the next week.   Return if symptoms worsen or fail to improve.  The patient was given clear instructions to go to ER or return to medical center if symptoms don't improve, worsen or new problems develop. The patient verbalized understanding.    Micheline Chapman FNP  07/11/2015, 8:48 AM

## 2015-07-13 ENCOUNTER — Ambulatory Visit (HOSPITAL_COMMUNITY): Payer: PPO

## 2015-07-14 ENCOUNTER — Telehealth: Payer: Self-pay

## 2015-07-14 ENCOUNTER — Other Ambulatory Visit: Payer: Self-pay | Admitting: Family Medicine

## 2015-07-14 ENCOUNTER — Ambulatory Visit (INDEPENDENT_AMBULATORY_CARE_PROVIDER_SITE_OTHER): Payer: PPO | Admitting: Family Medicine

## 2015-07-14 VITALS — BP 102/73 | HR 89

## 2015-07-14 DIAGNOSIS — I10 Essential (primary) hypertension: Secondary | ICD-10-CM

## 2015-07-14 MED ORDER — POLYETHYLENE GLYCOL 3350 17 G PO PACK: 17.0000 g | PACK | Freq: Every day | ORAL | Status: DC

## 2015-07-14 NOTE — Progress Notes (Signed)
Patient seen today for his blood pressure.  Pressure was 102/73.  Patient is feeling well.

## 2015-07-14 NOTE — Telephone Encounter (Signed)
Patient is requesting an rx for constipation.  Advised he can try colace over the counter but he says it is cheaper if he has an rx.  Please advise.

## 2015-08-11 ENCOUNTER — Ambulatory Visit (HOSPITAL_COMMUNITY)
Admission: RE | Admit: 2015-08-11 | Discharge: 2015-08-11 | Disposition: A | Discharge status: Home / Self Care | Payer: PPO | Source: Ambulatory Visit | Attending: Family Medicine | Admitting: Family Medicine

## 2015-08-11 DIAGNOSIS — R109 Unspecified abdominal pain: Secondary | ICD-10-CM | POA: Diagnosis not present

## 2015-08-11 DIAGNOSIS — R1084 Generalized abdominal pain: Secondary | ICD-10-CM

## 2015-08-11 DIAGNOSIS — R935 Abnormal findings on diagnostic imaging of other abdominal regions, including retroperitoneum: Secondary | ICD-10-CM | POA: Diagnosis not present

## 2015-08-11 MED ORDER — IOHEXOL 300 MG/ML  SOLN
100.0000 mL | Freq: Once | INTRAMUSCULAR | Status: AC | PRN
  Administered 2015-08-11: 100 mL via INTRAVENOUS

## 2015-08-16 ENCOUNTER — Emergency Department (HOSPITAL_COMMUNITY)
Admission: EM | Admit: 2015-08-16 | Discharge: 2015-08-16 | Disposition: A | Discharge status: Home / Self Care | Payer: PPO | Attending: Emergency Medicine | Admitting: Emergency Medicine

## 2015-08-16 ENCOUNTER — Encounter (HOSPITAL_COMMUNITY): Payer: Self-pay | Admitting: Emergency Medicine

## 2015-08-16 ENCOUNTER — Telehealth: Payer: Self-pay | Admitting: General Practice

## 2015-08-16 DIAGNOSIS — Z8579 Personal history of other malignant neoplasms of lymphoid, hematopoietic and related tissues: Secondary | ICD-10-CM | POA: Insufficient documentation

## 2015-08-16 DIAGNOSIS — R109 Unspecified abdominal pain: Secondary | ICD-10-CM | POA: Diagnosis not present

## 2015-08-16 DIAGNOSIS — Z862 Personal history of diseases of the blood and blood-forming organs and certain disorders involving the immune mechanism: Secondary | ICD-10-CM | POA: Insufficient documentation

## 2015-08-16 DIAGNOSIS — Z8659 Personal history of other mental and behavioral disorders: Secondary | ICD-10-CM | POA: Insufficient documentation

## 2015-08-16 DIAGNOSIS — F17210 Nicotine dependence, cigarettes, uncomplicated: Secondary | ICD-10-CM | POA: Diagnosis not present

## 2015-08-16 DIAGNOSIS — R110 Nausea: Secondary | ICD-10-CM | POA: Insufficient documentation

## 2015-08-16 DIAGNOSIS — Z8709 Personal history of other diseases of the respiratory system: Secondary | ICD-10-CM | POA: Insufficient documentation

## 2015-08-16 DIAGNOSIS — Z8719 Personal history of other diseases of the digestive system: Secondary | ICD-10-CM | POA: Diagnosis not present

## 2015-08-16 DIAGNOSIS — I509 Heart failure, unspecified: Secondary | ICD-10-CM | POA: Diagnosis not present

## 2015-08-16 DIAGNOSIS — E119 Type 2 diabetes mellitus without complications: Secondary | ICD-10-CM | POA: Insufficient documentation

## 2015-08-16 DIAGNOSIS — R1084 Generalized abdominal pain: Secondary | ICD-10-CM | POA: Insufficient documentation

## 2015-08-16 DIAGNOSIS — Z791 Long term (current) use of non-steroidal anti-inflammatories (NSAID): Secondary | ICD-10-CM | POA: Insufficient documentation

## 2015-08-16 DIAGNOSIS — N189 Chronic kidney disease, unspecified: Secondary | ICD-10-CM | POA: Diagnosis not present

## 2015-08-16 DIAGNOSIS — I129 Hypertensive chronic kidney disease with stage 1 through stage 4 chronic kidney disease, or unspecified chronic kidney disease: Secondary | ICD-10-CM | POA: Insufficient documentation

## 2015-08-16 DIAGNOSIS — Z792 Long term (current) use of antibiotics: Secondary | ICD-10-CM | POA: Insufficient documentation

## 2015-08-16 DIAGNOSIS — Z8614 Personal history of Methicillin resistant Staphylococcus aureus infection: Secondary | ICD-10-CM | POA: Insufficient documentation

## 2015-08-16 DIAGNOSIS — G8929 Other chronic pain: Secondary | ICD-10-CM | POA: Diagnosis not present

## 2015-08-16 DIAGNOSIS — Z8701 Personal history of pneumonia (recurrent): Secondary | ICD-10-CM | POA: Diagnosis not present

## 2015-08-16 LAB — URINALYSIS, ROUTINE W REFLEX MICROSCOPIC
Glucose, UA: NEGATIVE mg/dL
HGB URINE DIPSTICK: NEGATIVE
KETONES UR: NEGATIVE mg/dL
Leukocytes, UA: NEGATIVE
NITRITE: NEGATIVE
PROTEIN: NEGATIVE mg/dL
Specific Gravity, Urine: 1.027 (ref 1.005–1.030)
pH: 6 (ref 5.0–8.0)

## 2015-08-16 LAB — CBC
HCT: 43.1 % (ref 39.0–52.0)
Hemoglobin: 14 g/dL (ref 13.0–17.0)
MCH: 29 pg (ref 26.0–34.0)
MCHC: 32.5 g/dL (ref 30.0–36.0)
MCV: 89.4 fL (ref 78.0–100.0)
PLATELETS: 374 10*3/uL (ref 150–400)
RBC: 4.82 MIL/uL (ref 4.22–5.81)
RDW: 14.9 % (ref 11.5–15.5)
WBC: 9 10*3/uL (ref 4.0–10.5)

## 2015-08-16 LAB — COMPREHENSIVE METABOLIC PANEL
ALBUMIN: 3.8 g/dL (ref 3.5–5.0)
ALK PHOS: 90 U/L (ref 38–126)
ALT: 17 U/L (ref 17–63)
AST: 16 U/L (ref 15–41)
Anion gap: 8 (ref 5–15)
BILIRUBIN TOTAL: 0.5 mg/dL (ref 0.3–1.2)
BUN: 21 mg/dL — ABNORMAL HIGH (ref 6–20)
CALCIUM: 9 mg/dL (ref 8.9–10.3)
CO2: 24 mmol/L (ref 22–32)
CREATININE: 1.12 mg/dL (ref 0.61–1.24)
Chloride: 107 mmol/L (ref 101–111)
GFR calc non Af Amer: >60 mL/min (ref >60)
GLUCOSE: 132 mg/dL — AB (ref 65–99)
Potassium: 4 mmol/L (ref 3.5–5.1)
SODIUM: 139 mmol/L (ref 135–145)
TOTAL PROTEIN: 7.5 g/dL (ref 6.5–8.1)

## 2015-08-16 LAB — LIPASE, BLOOD: LIPASE: 56 U/L — AB (ref 11–51)

## 2015-08-16 NOTE — ED Provider Notes (Signed)
CSN: [id number]     Arrival date & time 08/16/15  1345 History   First MD Initiated Contact with Patient 08/16/15 2003     Chief Complaint  Patient presents with  . Flank Pain  . Nausea  . Abdominal Pain     (Consider location/radiation/quality/duration/timing/severity/associated sxs/prior Treatment) HPI  50 year old male who presents with flank pain and abdominal pain. History of HTN, small bowel resection, hernia repair, DM. History of CKD, CHF, EtOH abuse, and depression. He has had extensive surgeries on his abdomen and says that he has had some chronic abdominal pain related to this. One week ago developed acute worsening of his abdominal pain as well as left flank pain. Initially thought that he had pulled his back while trying to get out of the shower. With seen by a primary care physician, and underwent a CT of his abdomen pelvis on 08/11/2015. He had evidence of some inflammation around his pancreas, and was sent to the ED by his primary care doctor today for blood work. He states that over the past 3 days, he had full resolution of his back pain, that resolved with applying heat packs. States his abdominal pain resolved, and he has been at his baseline for the past 3 days. Thought he may have had hematuria one week ago, but currently denies that. No chest pain, difficulty breathing, nausea or vomiting, diarrhea, melena, hematochezia, fevers or chills, dysuria, or urinary frequency.  Past Medical History  Diagnosis Date  . Depression   . Alcoholic ([facility])   . Hypertension   . Renal disorder   . Respiratory failure ([facility])   . CHF (congestive heart failure) ([facility])   . Perforation bowel ([facility])   . PNA (pneumonia)   . MRSA (methicillin resistant Staphylococcus aureus)   . Renal failure   . Diabetes mellitus without complication ([facility])   . Multiple myeloma ([facility])   . Renal insufficiency   . Sickle cell anemia [facility])    Past Surgical History  Procedure Laterality Date  . Abdominal  surgery    . Cardiac surgery    . Peripherally inserted central catheter insertion    . Ileostomy    . Hernia repair     No family history on file. Social History  Substance Use Topics  . Smoking status: Current Every Day Smoker -- 1.00 packs/day for 28 years    Types: Cigarettes  . Smokeless tobacco: Never Used  . Alcohol Use: Yes     Comment: occasionally    Review of Systems 10/14 systems reviewed and are negative other than those stated in the HPI    Allergies  Bee venom  Home Medications   Prior to Admission medications   Medication Sig Start Date End Date Taking? Authorizing Provider  acetaminophen (TYLENOL) 500 MG tablet Take 500 mg by mouth every 6 (six) hours as needed for moderate pain.    Yes Historical Provider, MD  acetaminophen-codeine (TYLENOL #3) 300-30 MG tablet Take 1-2 tablets by mouth every 4 (four) hours as needed for moderate pain. 07/11/15  Yes Henrietta Hoover, NP  gabapentin (NEURONTIN) 100 MG capsule Take 100 mg by mouth 3 (three) times daily as needed (pain). Reported on 07/11/2015   Yes Historical Provider, MD  naproxen (NAPROSYN) 500 MG tablet Take 1 tablet (500 mg total) by mouth 2 (two) times daily with a meal. 07/11/15  Yes Henrietta Hoover, NP  oxycodone (OXY-IR) 5 MG capsule Take 5 mg by mouth every 4 (four) hours as  needed for pain. Reported on 07/11/2015   Yes Historical Provider, MD  pantoprazole (PROTONIX) 40 MG tablet Take 40 mg by mouth daily. Reported on 07/11/2015   Yes Historical Provider, MD  polyethylene glycol (MIRALAX / GLYCOLAX) packet Take 17 g by mouth daily. Reported on 07/11/2015 Patient taking differently: Take 17 g by mouth daily as needed for mild constipation. Reported on 07/11/2015 07/14/15  Yes Micheline Chapman, NP  senna (SENOKOT) 8.6 MG TABS tablet Take 1 tablet by mouth daily as needed for mild constipation. Reported on 07/11/2015   Yes Historical Provider, MD  sulfamethoxazole-trimethoprim (BACTRIM DS,SEPTRA DS) 800-160 MG  tablet Take 1 tablet by mouth 2 (two) times daily. 07/11/15  Yes Micheline Chapman, NP  traZODone (DESYREL) 50 MG tablet Take 1 tablet (50 mg total) by mouth at bedtime as needed for sleep. 07/11/15  Yes Micheline Chapman, NP  HYDROcodone-acetaminophen (NORCO/VICODIN) 5-325 MG per tablet Take 1 tablet by mouth every 6 (six) hours as needed for severe pain. Patient not taking: Reported on 07/14/2015 08/27/14   Marissa Sciacca, PA-C   BP 128/66 mmHg  Pulse 98  Temp(Src) 98.3 F (36.8 C) (Oral)  Resp 18  Ht '5\' 9"'$  (1.753 m)  Wt 196 lb (88.905 kg)  BMI 28.93 kg/m2  SpO2 100% Physical Exam Physical Exam  Nursing note and vitals reviewed. Constitutional: Well developed, well nourished, non-toxic, and in no acute distress Head: Normocephalic and atraumatic.  Mouth/Throat: Oropharynx is clear and moist.  Neck: Normal range of motion. Neck supple.  Cardiovascular: Normal rate and regular rhythm.   Pulmonary/Chest: Effort normal and breath sounds normal.  Abdominal: Soft. There is minimal diffuse tenderness (baseline). There is no rebound and no guarding. No cva tenderness. Musculoskeletal: Normal range of motion. No back tenderness. Neurological: Alert, no facial droop, fluent speech, moves all extremities symmetrically Skin: Skin is warm and dry.  Psychiatric: Cooperative  ED Course  Procedures (including critical care time) Labs Review Labs Reviewed  LIPASE, BLOOD - Abnormal; Notable for the following:    Lipase 56 (*)    All other components within normal limits  COMPREHENSIVE METABOLIC PANEL - Abnormal; Notable for the following:    Glucose, Bld 132 (*)    BUN 21 (*)    All other components within normal limits  URINALYSIS, ROUTINE W REFLEX MICROSCOPIC (NOT AT [facility]) - Abnormal; Notable for the following:    Bilirubin Urine SMALL (*)    All other components within normal limits  CBC    Imaging Review No results found. I have personally reviewed and evaluated these images and lab  results as part of my medical decision-making.   EKG Interpretation None      MDM   Final diagnoses:  Generalized abdominal pain   50 year old male who presents with abdominal pain and flank pain 1 week ago, that is now resolved. He is nontoxic in no acute distress with stable vital signs. He is overall soft and nonsurgical abdomen, with what he reports is his baseline abdominal pain. No back tenderness or flank tenderness noted. UA is not suggestive of hematuria or infection. His basic blood work including CBC and comprehensive metabolic panel is unremarkable. His lipase is 56, and not consistent with that of acute pancreatitis. Unclear if he did have pancreatitis that had resolved, in the setting of his CT scan on March 16. Given that he feels that he is at his baseline state of health, I do not require that he requires any repeat imaging. He  has no signs or symptoms of obstruction currently and there is no concern for infection. He will continue to follow-up with his primary care doctor regarding any further workup, and discuss potential GI referral as needed. Strict return instructions are reviewed. He expressed understanding of all discharge instructions and felt comfortable to plan of care.    Forde Dandy, MD 08/17/15 [phone]

## 2015-08-16 NOTE — Telephone Encounter (Signed)
This patient has not been seen here yet.  If he is having abdominal pain he should go to the ER -

## 2015-08-16 NOTE — Discharge Instructions (Signed)
Your CT scan 08/11/2015 shows in tact hernia repair and evidence of some inflammation around the pancreas. You do not show active pancreatitis today on your blood work. Please continue to follow-up with her primary care doctor for further management and repeat imaging is needed. You may need a gastroenterology follow-up as well, and the numbers provided for you above for follow-up. Return without fail for worsening symptoms, including worsening pain, vomiting unable to keep down food or fluids, fevers, or any other symptoms concerning to you.  Abdominal Pain, Adult Many things can cause abdominal pain. Usually, abdominal pain is not caused by a disease and will improve without treatment. It can often be observed and treated at home. Your health care provider will do a physical exam and possibly order blood tests and X-rays to help determine the seriousness of your pain. However, in many cases, more time must pass before a clear cause of the pain can be found. Before that point, your health care provider may not know if you need more testing or further treatment. HOME CARE INSTRUCTIONS Monitor your abdominal pain for any changes. The following actions may help to alleviate any discomfort you are experiencing:  Only take over-the-counter or prescription medicines as directed by your health care provider.  Do not take laxatives unless directed to do so by your health care provider.  Try a clear liquid diet (broth, tea, or water) as directed by your health care provider. Slowly move to a bland diet as tolerated. SEEK MEDICAL CARE IF:  You have unexplained abdominal pain.  You have abdominal pain associated with nausea or diarrhea.  You have pain when you urinate or have a bowel movement.  You experience abdominal pain that wakes you in the night.  You have abdominal pain that is worsened or improved by eating food.  You have abdominal pain that is worsened with eating fatty foods.  You have a  fever. SEEK IMMEDIATE MEDICAL CARE IF:  Your pain does not go away within 2 hours.  You keep throwing up (vomiting).  Your pain is felt only in portions of the abdomen, such as the right side or the left lower portion of the abdomen.  You pass bloody or black tarry stools. MAKE SURE YOU:  Understand these instructions.  Will watch your condition.  Will get help right away if you are not doing well or get worse.   This information is not intended to replace advice given to you by your health care provider. Make sure you discuss any questions you have with your health care provider.   Document Released: 02/21/2005 Document Revised: 02/02/2015 Document Reviewed: 01/21/2013 Elsevier Interactive Patient Education [employer].

## 2015-08-16 NOTE — Telephone Encounter (Signed)
Called & spoke with patient at provider's request regarding CT results. Informed patient that results and also of providers recommendation for patient to make his way to the ED for further testing, where they could further review his CT results and do whatever is deemed necessary medically for patient, such as a MRI, and that they could get needed results while patient is there. Explained that CT Imaging revealed findings that are indicative Acute Pancreatitis, even if his pain has subsided greatly, there is still enough evidence on CT scan to have Assessment & Evaluation done at ED. 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 Splenic infarction occurs when the splenic artery or one of its branches are occluded, for example by a blood clot. Patient understood & agreed to proceed to ED/SLS 03/21

## 2015-08-16 NOTE — ED Notes (Addendum)
Patient states he has had lower left back pain, nausea, and some blood in urine x1 week. Denies vomiting, diarrhea, fever. Patient was seen at PCP and PCP was concerned about pancreas.

## 2015-08-16 NOTE — Telephone Encounter (Signed)
Patient scheduled new patient appointment for 08/18/15 at 2:30pm approved by Dr. Etter Sjogren. Patient would like CT report taken on 08/11/15 reviewed prior to appointment. Patient was greatly appreciated.

## 2015-08-18 ENCOUNTER — Ambulatory Visit (INDEPENDENT_AMBULATORY_CARE_PROVIDER_SITE_OTHER): Payer: PPO | Admitting: Family Medicine

## 2015-08-18 ENCOUNTER — Encounter: Payer: Self-pay | Admitting: Family Medicine

## 2015-08-18 VITALS — BP 132/80 | HR 99 | Temp 98.1°F | Ht 69.0 in | Wt 203.4 lb

## 2015-08-18 DIAGNOSIS — R1013 Epigastric pain: Secondary | ICD-10-CM | POA: Diagnosis not present

## 2015-08-18 DIAGNOSIS — M545 Low back pain, unspecified: Secondary | ICD-10-CM

## 2015-08-18 MED ORDER — POLYETHYLENE GLYCOL 3350 17 G PO PACK: 17.0000 g | PACK | Freq: Every day | ORAL | Status: DC | PRN

## 2015-08-18 MED ORDER — ACETAMINOPHEN-CODEINE #3 300-30 MG PO TABS: 1.0000 | ORAL_TABLET | ORAL | Status: DC | PRN

## 2015-08-18 MED ORDER — PANTOPRAZOLE SODIUM 40 MG PO TBEC: 40.0000 mg | DELAYED_RELEASE_TABLET | Freq: Every day | ORAL | Status: DC

## 2015-08-18 NOTE — Progress Notes (Signed)
Pre visit review using our clinic review tool, if applicable. No additional management support is needed unless otherwise documented below in the visit note. 

## 2015-08-18 NOTE — Patient Instructions (Signed)
Food Choices for Gastroesophageal Reflux Disease, Adult When you have gastroesophageal reflux disease (GERD), the foods you eat and your eating habits are very important. Choosing the right foods can help ease the discomfort of GERD. WHAT GENERAL GUIDELINES DO I NEED TO FOLLOW?  Choose fruits, vegetables, whole grains, low-fat dairy products, and low-fat meat, fish, and poultry.  Limit fats such as oils, salad dressings, butter, nuts, and avocado.  Keep a food diary to identify foods that cause symptoms.  Avoid foods that cause reflux. These may be different for different people.  Eat frequent small meals instead of three large meals each day.  Eat your meals slowly, in a relaxed setting.  Limit fried foods.  Cook foods using methods other than frying.  Avoid drinking alcohol.  Avoid drinking large amounts of liquids with your meals.  Avoid bending over or lying down until 2-3 hours after eating. WHAT FOODS ARE NOT RECOMMENDED? The following are some foods and drinks that may worsen your symptoms: Vegetables Tomatoes. Tomato juice. Tomato and spaghetti sauce. Chili peppers. Onion and garlic. Horseradish. Fruits Oranges, grapefruit, and lemon (fruit and juice). Meats High-fat meats, fish, and poultry. This includes hot dogs, ribs, ham, sausage, salami, and bacon. Dairy Whole milk and chocolate milk. Sour cream. Cream. Butter. Ice cream. Cream cheese.  Beverages Coffee and tea, with or without caffeine. Carbonated beverages or energy drinks. Condiments Hot sauce. Barbecue sauce.  Sweets/Desserts Chocolate and cocoa. Donuts. Peppermint and spearmint. Fats and Oils High-fat foods, including French fries and potato chips. Other Vinegar. Strong spices, such as black pepper, white pepper, red pepper, cayenne, curry powder, cloves, ginger, and chili powder. The items listed above may not be a complete list of foods and beverages to avoid. Contact your dietitian for more  information.   This information is not intended to replace advice given to you by your health care provider. Make sure you discuss any questions you have with your health care provider.   Document Released: 05/14/2005 Document Revised: 06/04/2014 Document Reviewed: 03/18/2013 Elsevier Interactive Patient Education 2016 Elsevier Inc.  

## 2015-08-18 NOTE — Progress Notes (Signed)
Patient ID: Lucas Small, male    DOB: 1966/01/09  Age: 50 y.o. MRN: [id number]    Subjective:  Subjective HPI LANDAN FEDIE presents to establish--- he had complicated hernia surgery in [place]---  Dr Orlean Patten in [place] and DR Horton (ID) in [place]---  No one was able to do it here.  He saw them in January and about 3 weeks ago he noticed another "hernia"?   He went to pcp and CT was done.     Review of Systems  Constitutional: Negative for diaphoresis, appetite change, fatigue and unexpected weight change.  Eyes: Negative for pain, redness and visual disturbance.  Respiratory: Negative for cough, chest tightness, shortness of breath and wheezing.   Cardiovascular: Negative for chest pain, palpitations and leg swelling.  Gastrointestinal: Positive for abdominal pain.  Endocrine: Negative for cold intolerance, heat intolerance, polydipsia, polyphagia and polyuria.  Genitourinary: Negative for dysuria, frequency and difficulty urinating.  Neurological: Negative for dizziness, light-headedness, numbness and headaches.    History Past Medical History  Diagnosis Date  . Depression   . Alcoholic ([place])   . Hypertension   . Renal disorder   . Respiratory failure ([place])   . CHF (congestive heart failure) ([place])   . Perforation bowel ([place])   . PNA (pneumonia)   . MRSA (methicillin resistant Staphylococcus aureus)   . Renal failure   . Diabetes mellitus without complication ([place])   . Multiple myeloma ([place])   . Renal insufficiency   . Sickle cell anemia (HCC)     He has past surgical history that includes Abdominal surgery; Cardiac surgery; Peripherally inserted central catheter insertion; Ileostomy; and Hernia repair.   His family history is not on file.He reports that he has been smoking Cigarettes.  He has a 28 pack-year smoking history. He has never used smokeless tobacco. He reports that he drinks alcohol. He reports that he does not use illicit drugs.  Current  Outpatient Prescriptions on File Prior to Visit  Medication Sig Dispense Refill  . acetaminophen (TYLENOL) 500 MG tablet Take 500 mg by mouth every 6 (six) hours as needed for moderate pain.     Marland Kitchen gabapentin (NEURONTIN) 100 MG capsule Take 100 mg by mouth 3 (three) times daily as needed (pain). Reported on 07/11/2015    . naproxen (NAPROSYN) 500 MG tablet Take 1 tablet (500 mg total) by mouth 2 (two) times daily with a meal. 30 tablet 0  . oxycodone (OXY-IR) 5 MG capsule Take 5 mg by mouth every 4 (four) hours as needed for pain. Reported on 07/11/2015    . senna (SENOKOT) 8.6 MG TABS tablet Take 1 tablet by mouth daily as needed for mild constipation. Reported on 07/11/2015    . traZODone (DESYREL) 50 MG tablet Take 1 tablet (50 mg total) by mouth at bedtime as needed for sleep. 30 tablet 3   Current Facility-Administered Medications on File Prior to Visit  Medication Dose Route Frequency Provider Last Rate Last Dose  . folic acid (FOLVITE) tablet 1 mg  1 mg Oral Daily Shuvon B Rankin, NP      . haloperidol (HALDOL) tablet 5 mg  5 mg Oral Q6H PRN Shuvon B Rankin, NP      . LORazepam (ATIVAN) tablet 1 mg  1 mg Oral Q8H PRN Shuvon B Rankin, NP      . magnesium hydroxide (MILK OF MAGNESIA) suspension 30 mL  30 mL Oral Daily PRN Shuvon B Rankin, NP      . multivitamin  with minerals tablet 1 tablet  1 tablet Oral Daily Shuvon B Rankin, NP         Objective:  Objective Physical Exam  Constitutional: He is oriented to person, place, and time. Vital signs are normal. He appears well-developed and well-nourished. He is sleeping.  HENT:  Head: Normocephalic and atraumatic.  Mouth/Throat: Oropharynx is clear and moist.  Eyes: EOM are normal. Pupils are equal, round, and reactive to light.  Neck: Normal range of motion. Neck supple. No thyromegaly present.  Cardiovascular: Normal rate and regular rhythm.   No murmur heard. Pulmonary/Chest: Effort normal and breath sounds normal. No respiratory  distress. He has no wheezes. He has no rales. He exhibits no tenderness.  Abdominal: There is tenderness. There is no rebound and no guarding.  Musculoskeletal: He exhibits no edema or tenderness.  Neurological: He is alert and oriented to person, place, and time.  Skin: Skin is warm and dry.  Psychiatric: He has a normal mood and affect. His behavior is normal. Judgment and thought content normal.  Nursing note and vitals reviewed.  BP 132/80 mmHg  Pulse 99  Temp(Src) 98.1 F (36.7 C) (Oral)  Ht '5\' 9"'$  (1.753 m)  Wt 203 lb 6.4 oz (92.262 kg)  BMI 30.02 kg/m2  SpO2 96% Wt Readings from Last 3 Encounters:  08/18/15 203 lb 6.4 oz (92.262 kg)  08/16/15 196 lb (88.905 kg)  07/11/15 200 lb (90.719 kg)     Lab Results  Component Value Date   WBC 9.0 08/16/2015   HGB 14.0 08/16/2015   HCT 43.1 08/16/2015   PLT 374 08/16/2015   GLUCOSE 138* 08/18/2015   CHOL 216* 08/18/2015   TRIG 171.0* 08/18/2015   HDL 29.80* 08/18/2015   LDLCALC 152* 08/18/2015   ALT 16 08/18/2015   AST 18 08/18/2015   NA 139 08/18/2015   K 4.3 08/18/2015   CL 104 08/18/2015   CREATININE 1.04 08/18/2015   BUN 20 08/18/2015   CO2 28 08/18/2015   TSH 1.114 04/14/2015   INR 1.11 10/14/2010   HGBA1C 7.3* 08/18/2015    No results found.   Assessment & Plan:  Plan I have discontinued Mr. Nygaard HYDROcodone-acetaminophen. I have also changed his polyethylene glycol and pantoprazole. Additionally, I am having him maintain his gabapentin, senna, oxycodone, acetaminophen, naproxen, traZODone, sulfamethoxazole-trimethoprim, and acetaminophen-codeine.  Meds ordered this encounter  Medications  . sulfamethoxazole-trimethoprim (BACTRIM DS,SEPTRA DS) 800-160 MG tablet    Sig: Take 1 tablet by mouth 2 (two) times daily.  . polyethylene glycol (MIRALAX / GLYCOLAX) packet    Sig: Take 17 g by mouth daily as needed for mild constipation. Reported on 07/11/2015    Dispense:  14 each    Refill:  1  .  acetaminophen-codeine (TYLENOL #3) 300-30 MG tablet    Sig: Take 1-2 tablets by mouth every 4 (four) hours as needed for moderate pain.    Dispense:  30 tablet    Refill:  0  . pantoprazole (PROTONIX) 40 MG tablet    Sig: Take 1 tablet (40 mg total) by mouth daily. Reported on 07/11/2015    Dispense:  90 tablet    Refill:  3    Problem List Items Addressed This Visit    None    Visit Diagnoses    Left-sided low back pain without sciatica    -  Primary    Relevant Medications    polyethylene glycol (MIRALAX / GLYCOLAX) packet    acetaminophen-codeine (TYLENOL #3) 300-30 MG tablet  Other Relevant Orders    Comprehensive metabolic panel (Completed)    Lipid panel (Completed)    Hemoglobin A1c (Completed)    Comprehensive metabolic panel    Hemoglobin A1c    Lipase (Completed)    Amylase (Completed)    Dyspepsia        Relevant Medications    polyethylene glycol (MIRALAX / GLYCOLAX) packet    acetaminophen-codeine (TYLENOL #3) 300-30 MG tablet    pantoprazole (PROTONIX) 40 MG tablet    Other Relevant Orders    Ambulatory referral to Gastroenterology    Comprehensive metabolic panel (Completed)    Lipid panel (Completed)    Hemoglobin A1c (Completed)    H. pylori antibody, IgG (Completed)    Hemoglobin A1c       Follow-up: Return in about 6 months (around 02/18/2016), or if symptoms worsen or fail to improve, for annual exam, fasting.  Garnet Koyanagi, DO

## 2015-08-19 LAB — COMPREHENSIVE METABOLIC PANEL
ALT: 16 U/L (ref 0–53)
AST: 18 U/L (ref 0–37)
Albumin: 4.3 g/dL (ref 3.5–5.2)
Alkaline Phosphatase: 98 U/L (ref 39–117)
BUN: 20 mg/dL (ref 6–23)
CALCIUM: 9.8 mg/dL (ref 8.4–10.5)
CHLORIDE: 104 meq/L (ref 96–112)
CO2: 28 mEq/L (ref 19–32)
Creatinine, Ser: 1.04 mg/dL (ref 0.40–1.50)
GFR: 80.76 mL/min (ref >60.00)
GLUCOSE: 138 mg/dL — AB (ref 70–99)
POTASSIUM: 4.3 meq/L (ref 3.5–5.1)
SODIUM: 139 meq/L (ref 135–145)
Total Bilirubin: 0.4 mg/dL (ref 0.2–1.2)
Total Protein: 8 g/dL (ref 6.0–8.3)

## 2015-08-19 LAB — AMYLASE: Amylase: 44 U/L (ref 27–131)

## 2015-08-19 LAB — LIPID PANEL
CHOL/HDL RATIO: 7
Cholesterol: 216 mg/dL — ABNORMAL HIGH (ref 0–200)
HDL: 29.8 mg/dL — ABNORMAL LOW (ref >39.00)
LDL CALC: 152 mg/dL — AB (ref 0–99)
NONHDL: 185.74
Triglycerides: 171 mg/dL — ABNORMAL HIGH (ref 0.0–149.0)
VLDL: 34.2 mg/dL (ref 0.0–40.0)

## 2015-08-19 LAB — H. PYLORI ANTIBODY, IGG: H Pylori IgG: NEGATIVE

## 2015-08-19 LAB — LIPASE: Lipase: 68 U/L — ABNORMAL HIGH (ref 11.0–59.0)

## 2015-08-19 LAB — HEMOGLOBIN A1C: HEMOGLOBIN A1C: 7.3 % — AB (ref 4.6–6.5)

## 2015-08-26 ENCOUNTER — Ambulatory Visit: Payer: Self-pay | Admitting: Family Medicine

## 2015-08-30 ENCOUNTER — Telehealth: Payer: Self-pay

## 2015-08-30 DIAGNOSIS — IMO0001 Reserved for inherently not codable concepts without codable children: Secondary | ICD-10-CM

## 2015-08-30 DIAGNOSIS — E1165 Type 2 diabetes mellitus with hyperglycemia: Principal | ICD-10-CM

## 2015-08-30 DIAGNOSIS — E785 Hyperlipidemia, unspecified: Secondary | ICD-10-CM

## 2015-08-30 MED ORDER — METFORMIN HCL 500 MG PO TABS: 500.0000 mg | ORAL_TABLET | Freq: Every day | ORAL | Status: DC

## 2015-08-30 MED ORDER — ROSUVASTATIN CALCIUM 20 MG PO TABS: 20.0000 mg | ORAL_TABLET | Freq: Every day | ORAL | Status: DC

## 2015-08-30 NOTE — Telephone Encounter (Signed)
-----   Message from Ann Held, DO sent at 08/25/2015  8:59 PM EDT ----- Pt is diabetic----- needs dm teaching , glucometer and diet Start metformin xr 500 mg a day Recheck 3 months Cholesterol--- LDL goal < 70,  HDL >40,  TG < 150.  Diet and exercise will increase HDL and decrease LDL and TG.  Fish,  Fish Oil, Flaxseed oil will also help increase the HDL and decrease Triglycerides.   Recheck labs in 3 months Start crestor 20 mg #30  1 every night Cmp, lipid, hgba1c, microalbumin.

## 2015-08-30 NOTE — Telephone Encounter (Signed)
Rx's sent. Labs ordered.

## 2015-09-06 ENCOUNTER — Ambulatory Visit: Payer: PPO

## 2015-09-06 DIAGNOSIS — IMO0001 Reserved for inherently not codable concepts without codable children: Secondary | ICD-10-CM

## 2015-09-06 DIAGNOSIS — E1165 Type 2 diabetes mellitus with hyperglycemia: Principal | ICD-10-CM

## 2015-09-06 NOTE — Progress Notes (Signed)
Patient in for patient teaching regarding Diabetes and checking blood sugar levels. Reviewed purpose for patient starting Metformin to regulate blood sugar levels. CBG taken during visit =110. Reviewed with patient  When BS levels too High too Low and within normal range. Glucometer Kit given to patient. Insrtucted patient on how to use. Patient did repeat demonstration successfully.   Patient given reading materials and Blood Sugar Log for recording his blood sugars.  

## 2015-10-04 ENCOUNTER — Other Ambulatory Visit: Payer: Self-pay | Admitting: Family Medicine

## 2015-10-04 NOTE — Telephone Encounter (Signed)
Medication filled to pharmacy as requested.   

## 2015-11-01 ENCOUNTER — Telehealth: Payer: Self-pay | Admitting: Family Medicine

## 2015-11-01 MED ORDER — GLUCOSE BLOOD VI STRP: ORAL_STRIP | Status: DC

## 2015-11-01 MED ORDER — ONETOUCH DELICA LANCETS FINE MISC: Status: DC

## 2015-11-01 NOTE — Telephone Encounter (Signed)
Strips and lancets sent.     KP

## 2015-11-01 NOTE — Telephone Encounter (Signed)
Relation to PP:HKFE Call back [address]  Reason for call: pt states he is needing strips for his one touch verio, states dr. Etter Sjogren provided him with the kit on his last visit but he is completely out of the strips. Pt was not exactly sure what the name of the kit was.

## 2015-11-29 ENCOUNTER — Other Ambulatory Visit: Payer: Self-pay

## 2015-11-30 ENCOUNTER — Other Ambulatory Visit: Payer: Self-pay

## 2015-12-01 ENCOUNTER — Other Ambulatory Visit: Payer: Self-pay

## 2015-12-05 ENCOUNTER — Other Ambulatory Visit (INDEPENDENT_AMBULATORY_CARE_PROVIDER_SITE_OTHER): Payer: PPO

## 2015-12-05 ENCOUNTER — Telehealth: Payer: Self-pay | Admitting: *Deleted

## 2015-12-05 DIAGNOSIS — IMO0001 Reserved for inherently not codable concepts without codable children: Secondary | ICD-10-CM

## 2015-12-05 DIAGNOSIS — E1165 Type 2 diabetes mellitus with hyperglycemia: Secondary | ICD-10-CM | POA: Diagnosis not present

## 2015-12-05 DIAGNOSIS — E785 Hyperlipidemia, unspecified: Secondary | ICD-10-CM

## 2015-12-05 LAB — LIPID PANEL
CHOL/HDL RATIO: 7
CHOLESTEROL: 234 mg/dL — AB (ref 0–200)
HDL: 33.9 mg/dL — ABNORMAL LOW (ref >39.00)
LDL CALC: 165 mg/dL — AB (ref 0–99)
NONHDL: 200.56
Triglycerides: 177 mg/dL — ABNORMAL HIGH (ref 0.0–149.0)
VLDL: 35.4 mg/dL (ref 0.0–40.0)

## 2015-12-05 LAB — COMPREHENSIVE METABOLIC PANEL
ALBUMIN: 4.1 g/dL (ref 3.5–5.2)
ALT: 23 U/L (ref 0–53)
AST: 20 U/L (ref 0–37)
Alkaline Phosphatase: 103 U/L (ref 39–117)
BUN: 16 mg/dL (ref 6–23)
CHLORIDE: 106 meq/L (ref 96–112)
CO2: 27 mEq/L (ref 19–32)
CREATININE: 1.02 mg/dL (ref 0.40–1.50)
Calcium: 9.9 mg/dL (ref 8.4–10.5)
GFR: 82.49 mL/min (ref >60.00)
GLUCOSE: 172 mg/dL — AB (ref 70–99)
Potassium: 3.7 mEq/L (ref 3.5–5.1)
Sodium: 138 mEq/L (ref 135–145)
Total Bilirubin: 0.6 mg/dL (ref 0.2–1.2)
Total Protein: 7.1 g/dL (ref 6.0–8.3)

## 2015-12-05 LAB — MICROALBUMIN / CREATININE URINE RATIO
Creatinine,U: 301.2 mg/dL
MICROALB UR: 4.6 mg/dL — AB (ref 0.0–1.9)
Microalb Creat Ratio: 1.5 mg/g (ref 0.0–30.0)

## 2015-12-05 LAB — HEMOGLOBIN A1C: HEMOGLOBIN A1C: 6.8 % — AB (ref 4.6–6.5)

## 2015-12-05 NOTE — Telephone Encounter (Signed)
Its not a blood test-- usually a nasal swab

## 2015-12-05 NOTE — Telephone Encounter (Signed)
To MD please advise    KP

## 2015-12-05 NOTE — Telephone Encounter (Signed)
Pt had a lab appointment today. Pt requested if he could have a MRSA lab test done as well. Pt apparently has long medical history involving MRSA treatment in [place]. Pt states that he has no negative signs or symptoms at this time. Pt just wanted to check if he still has it.Marland Kitchen

## 2015-12-15 ENCOUNTER — Ambulatory Visit (INDEPENDENT_AMBULATORY_CARE_PROVIDER_SITE_OTHER): Payer: PPO | Admitting: Family Medicine

## 2015-12-15 ENCOUNTER — Encounter: Payer: Self-pay | Admitting: Family Medicine

## 2015-12-15 VITALS — BP 122/80 | HR 99 | Temp 98.3°F | Ht 69.0 in | Wt 204.2 lb

## 2015-12-15 DIAGNOSIS — E785 Hyperlipidemia, unspecified: Secondary | ICD-10-CM | POA: Diagnosis not present

## 2015-12-15 DIAGNOSIS — M549 Dorsalgia, unspecified: Secondary | ICD-10-CM | POA: Insufficient documentation

## 2015-12-15 DIAGNOSIS — R1013 Epigastric pain: Secondary | ICD-10-CM | POA: Diagnosis not present

## 2015-12-15 DIAGNOSIS — E1165 Type 2 diabetes mellitus with hyperglycemia: Secondary | ICD-10-CM

## 2015-12-15 DIAGNOSIS — IMO0002 Reserved for concepts with insufficient information to code with codable children: Secondary | ICD-10-CM

## 2015-12-15 DIAGNOSIS — M545 Low back pain, unspecified: Secondary | ICD-10-CM

## 2015-12-15 DIAGNOSIS — I10 Essential (primary) hypertension: Secondary | ICD-10-CM

## 2015-12-15 DIAGNOSIS — E1151 Type 2 diabetes mellitus with diabetic peripheral angiopathy without gangrene: Secondary | ICD-10-CM

## 2015-12-15 DIAGNOSIS — K219 Gastro-esophageal reflux disease without esophagitis: Secondary | ICD-10-CM

## 2015-12-15 MED ORDER — PANTOPRAZOLE SODIUM 40 MG PO TBEC: 40.0000 mg | DELAYED_RELEASE_TABLET | Freq: Two times a day (BID) | ORAL | Status: DC

## 2015-12-15 MED ORDER — ROSUVASTATIN CALCIUM 20 MG PO TABS: ORAL_TABLET | ORAL | Status: DC

## 2015-12-15 MED ORDER — METFORMIN HCL 500 MG PO TABS: 500.0000 mg | ORAL_TABLET | Freq: Two times a day (BID) | ORAL | Status: DC

## 2015-12-15 MED ORDER — ACETAMINOPHEN-CODEINE #3 300-30 MG PO TABS: 1.0000 | ORAL_TABLET | ORAL | Status: DC | PRN

## 2015-12-15 NOTE — Progress Notes (Signed)
Patient ID: Lucas Small, male    DOB: 04-06-1966  Age: 50 y.o. MRN: [id number]    Subjective:  Subjective HPI Lucas Small presents for d/u dm, cholesterol and bp.  Pt would also like to inc # pain pills secondary to difficulty getting to pharmacy.  His last rx of #30 lasted 3 months.      HPI HYPERTENSION  Blood pressure range-not checking  Chest pain- no      Dyspnea- no Lightheadedness- no   Edema- no Other side effects - no   Medication compliance: good Low salt diet- yes  DIABETES  Blood Sugar ranges-160-210  Polyuria- no New Visual problems- no Hypoglycemic symptoms- no Other side effects-no Medication compliance - good Last eye exam- 2003 Foot exam- today  HYPERLIPIDEMIA  Medication compliance- poor-- pt did not realize he was supposed to keep taking it RUQ pain- no  Muscle aches- no Other side effects-no  Review of Systems  Constitutional: Negative for diaphoresis, appetite change, fatigue and unexpected weight change.  Eyes: Negative for pain, redness and visual disturbance.  Respiratory: Negative for cough, chest tightness, shortness of breath and wheezing.   Cardiovascular: Negative for chest pain, palpitations and leg swelling.  Gastrointestinal: Positive for abdominal distention.       Inc burping  Endocrine: Negative for cold intolerance, heat intolerance, polydipsia, polyphagia and polyuria.  Genitourinary: Negative for dysuria, frequency and difficulty urinating.  Neurological: Negative for dizziness, light-headedness, numbness and headaches.     History Past Medical History  Diagnosis Date  . Depression   . Alcoholic ([place])   . Hypertension   . Renal disorder   . Respiratory failure ([place])   . CHF (congestive heart failure) ([place])   . Perforation bowel ([place])   . PNA (pneumonia)   . MRSA (methicillin resistant Staphylococcus aureus)   . Renal failure   . Diabetes mellitus without complication ([place])   . Multiple myeloma ([place])   . Renal  insufficiency   . Sickle cell anemia (H[facility])     He has past surgical history that includes Abdominal surgery; Cardiac surgery; Peripherally inserted central catheter insertion; Ileostomy; and Hernia repair.   His family history is not on file.He reports that he has been smoking Cigarettes.  He has a 28 pack-year smoking history. He has never used smokeless tobacco. He reports that he drinks alcohol. He reports that he does not use illicit drugs.  Current Outpatient Prescriptions on File Prior to Visit  Medication Sig Dispense Refill  . acetaminophen (TYLENOL) 500 MG tablet Take 500 mg by mouth every 6 (six) hours as needed for moderate pain.     Marland Kitchen gabapentin (NEURONTIN) 100 MG capsule Take 100 mg by mouth 3 (three) times daily as needed (pain). Reported on 07/11/2015    . naproxen (NAPROSYN) 500 MG tablet Take 1 tablet (500 mg total) by mouth 2 (two) times daily with a meal. 30 tablet 0  . polyethylene glycol (MIRALAX / GLYCOLAX) packet Take 17 g by mouth daily as needed for mild constipation. Reported on 07/11/2015 14 each 1  . glucose blood (ONETOUCH VERIO) test strip Check Blood sugar twice daily. Dx:E11.9 (Patient not taking: Reported on 12/15/2015) 100 each 12  . ONETOUCH DELICA LANCETS FINE MISC Check Blood sugar twice daily. Dx:E11.9 (Patient not taking: Reported on 12/15/2015) 100 each 12  . senna (SENOKOT) 8.6 MG TABS tablet Take 1 tablet by mouth daily as needed for mild constipation. Reported on 12/15/2015    . sulfamethoxazole-trimethoprim (BACTRIM DS,SEPTRA DS)  800-160 MG tablet Take 1 tablet by mouth 2 (two) times daily. Reported on 12/15/2015    . traZODone (DESYREL) 50 MG tablet Take 1 tablet (50 mg total) by mouth at bedtime as needed for sleep. (Patient not taking: Reported on 12/15/2015) 30 tablet 3   Current Facility-Administered Medications on File Prior to Visit  Medication Dose Route Frequency Provider Last Rate Last Dose  . folic acid (FOLVITE) tablet 1 mg  1 mg Oral Daily  Shuvon B Rankin, NP      . haloperidol (HALDOL) tablet 5 mg  5 mg Oral Q6H PRN Shuvon B Rankin, NP      . LORazepam (ATIVAN) tablet 1 mg  1 mg Oral Q8H PRN Shuvon B Rankin, NP      . magnesium hydroxide (MILK OF MAGNESIA) suspension 30 mL  30 mL Oral Daily PRN Shuvon B Rankin, NP      . multivitamin with minerals tablet 1 tablet  1 tablet Oral Daily Shuvon B Rankin, NP         Objective:  Objective Physical Exam  Constitutional: He is oriented to person, place, and time. Vital signs are normal. He appears well-developed and well-nourished. He is sleeping.  HENT:  Head: Normocephalic and atraumatic.  Mouth/Throat: Oropharynx is clear and moist.  Eyes: EOM are normal. Pupils are equal, round, and reactive to light.  Neck: Normal range of motion. Neck supple. No thyromegaly present.  Cardiovascular: Normal rate and regular rhythm.   No murmur heard. Pulmonary/Chest: Effort normal and breath sounds normal. No respiratory distress. He has no wheezes. He has no rales. He exhibits no tenderness.  Musculoskeletal: He exhibits no edema or tenderness.  Neurological: He is alert and oriented to person, place, and time.  Skin: Skin is warm and dry.  Psychiatric: He has a normal mood and affect. His behavior is normal. Judgment and thought content normal.  Nursing note and vitals reviewed.  BP 122/80 mmHg  Pulse 99  Temp(Src) 98.3 F (36.8 C) (Oral)  Ht 5' 9" (1.753 m)  Wt 204 lb 4 oz (92.647 kg)  BMI 30.15 kg/m2  SpO2 96% Wt Readings from Last 3 Encounters:  12/15/15 204 lb 4 oz (92.647 kg)  08/18/15 203 lb 6.4 oz (92.262 kg)  08/16/15 196 lb (88.905 kg)     Lab Results  Component Value Date   WBC 9.0 08/16/2015   HGB 14.0 08/16/2015   HCT 43.1 08/16/2015   PLT 374 08/16/2015   GLUCOSE 172* 12/05/2015   CHOL 234* 12/05/2015   TRIG 177.0* 12/05/2015   HDL 33.90* 12/05/2015   LDLCALC 165* 12/05/2015   ALT 23 12/05/2015   AST 20 12/05/2015   NA 138 12/05/2015   K 3.7 12/05/2015    CL 106 12/05/2015   CREATININE 1.02 12/05/2015   BUN 16 12/05/2015   CO2 27 12/05/2015   TSH 1.114 04/14/2015   INR 1.11 10/14/2010   HGBA1C 6.8* 12/05/2015   MICROALBUR 4.6* 12/05/2015    No results found.   Assessment & Plan:  Plan I have discontinued Mr. Spiers oxycodone. I have also changed his pantoprazole and metFORMIN. Additionally, I am having him maintain his gabapentin, senna, acetaminophen, naproxen, traZODone, sulfamethoxazole-trimethoprim, polyethylene glycol, glucose blood, ONETOUCH DELICA LANCETS FINE, acetaminophen-codeine, and rosuvastatin.  Meds ordered this encounter  Medications  . acetaminophen-codeine (TYLENOL #3) 300-30 MG tablet    Sig: Take 1-2 tablets by mouth every 4 (four) hours as needed for moderate pain.    Dispense:  90 tablet  Refill:  0  . rosuvastatin (CRESTOR) 20 MG tablet    Sig: TAKE 1 TABLET(20 MG) BY MOUTH DAILY    Dispense:  90 tablet    Refill:  1  . pantoprazole (PROTONIX) 40 MG tablet    Sig: Take 1 tablet (40 mg total) by mouth 2 (two) times daily. Reported on 07/11/2015    Dispense:  180 tablet    Refill:  1  . metFORMIN (GLUCOPHAGE) 500 MG tablet    Sig: Take 1 tablet (500 mg total) by mouth 2 (two) times daily with a meal.    Dispense:  180 tablet    Refill:  1    Problem List Items Addressed This Visit    None    Visit Diagnoses    Left-sided low back pain without sciatica    -  Primary    Relevant Medications    acetaminophen-codeine (TYLENOL #3) 300-30 MG tablet    Dyspepsia        Relevant Medications    acetaminophen-codeine (TYLENOL #3) 300-30 MG tablet    pantoprazole (PROTONIX) 40 MG tablet    Hyperlipidemia        Relevant Medications    rosuvastatin (CRESTOR) 20 MG tablet    DM (diabetes mellitus) type II uncontrolled, periph vascular disorder (HCC)        Relevant Medications    rosuvastatin (CRESTOR) 20 MG tablet    metFORMIN (GLUCOPHAGE) 500 MG tablet    Other Relevant Orders    Ambulatory  referral to Ophthalmology    Essential hypertension        Relevant Medications    rosuvastatin (CRESTOR) 20 MG tablet    Gastroesophageal reflux disease, esophagitis presence not specified        Relevant Medications    pantoprazole (PROTONIX) 40 MG tablet    Other Relevant Orders    Ambulatory referral to Gastroenterology     1. Left-sided low back pain without sciatica  - acetaminophen-codeine (TYLENOL #3) 300-30 MG tablet; Take 1-2 tablets by mouth every 4 (four) hours as needed for moderate pain.  Dispense: 90 tablet; Refill: 0  2. Dyspepsia  - acetaminophen-codeine (TYLENOL #3) 300-30 MG tablet; Take 1-2 tablets by mouth every 4 (four) hours as needed for moderate pain.  Dispense: 90 tablet; Refill: 0 - pantoprazole (PROTONIX) 40 MG tablet; Take 1 tablet (40 mg total) by mouth 2 (two) times daily. Reported on 07/11/2015  Dispense: 180 tablet; Refill: 1  3. Hyperlipidemia - rosuvastatin (CRESTOR) 20 MG tablet; TAKE 1 TABLET(20 MG) BY MOUTH DAILY  Dispense: 90 tablet; Refill: 1  4. DM (diabetes mellitus) type II uncontrolled, periph vascular disorder ([place])  - Ambulatory referral to Ophthalmology - metFORMIN (GLUCOPHAGE) 500 MG tablet; Take 1 tablet (500 mg total) by mouth 2 (two) times daily with a meal.  Dispense: 180 tablet; Refill: 1  5. Essential hypertension   6. Gastroesophageal reflux disease, esophagitis presence not specified  - Ambulatory referral to Gastroenterology  7. Hyperlipidemia LDL goal <70 Pt was not taking the crestor Refill and recheck 3 months  Follow-up: Return in about 3 months (around 03/16/2016), or if symptoms worsen or fail to improve, for hypertension, hyperlipidemia, diabetes II.  Ann Held, DO

## 2015-12-15 NOTE — Assessment & Plan Note (Signed)
protonix bid  refer to gi

## 2015-12-15 NOTE — Assessment & Plan Note (Signed)
Chronic Refill pain meds-- pt takes very rarely

## 2015-12-15 NOTE — Progress Notes (Signed)
Pre visit review using our clinic review tool, if applicable. No additional management support is needed unless otherwise documented below in the visit note. 

## 2015-12-15 NOTE — Assessment & Plan Note (Signed)
con't lipitor Check labs 

## 2015-12-15 NOTE — Assessment & Plan Note (Signed)
Inc metformin bid con't diet and exercise

## 2015-12-15 NOTE — Patient Instructions (Addendum)

## 2016-01-03 DIAGNOSIS — N5319 Other ejaculatory dysfunction: Secondary | ICD-10-CM | POA: Diagnosis not present

## 2016-01-03 DIAGNOSIS — N433 Hydrocele, unspecified: Secondary | ICD-10-CM | POA: Diagnosis not present

## 2016-01-03 DIAGNOSIS — N5201 Erectile dysfunction due to arterial insufficiency: Secondary | ICD-10-CM | POA: Diagnosis not present

## 2016-01-03 DIAGNOSIS — N200 Calculus of kidney: Secondary | ICD-10-CM | POA: Diagnosis not present

## 2016-02-13 ENCOUNTER — Encounter: Payer: Self-pay | Admitting: Family Medicine

## 2016-02-13 ENCOUNTER — Ambulatory Visit (INDEPENDENT_AMBULATORY_CARE_PROVIDER_SITE_OTHER): Payer: PPO | Admitting: Family Medicine

## 2016-02-13 VITALS — BP 121/84 | HR 94 | Temp 98.1°F | Resp 17 | Ht 69.0 in | Wt 209.0 lb

## 2016-02-13 DIAGNOSIS — E785 Hyperlipidemia, unspecified: Secondary | ICD-10-CM | POA: Diagnosis not present

## 2016-02-13 DIAGNOSIS — E1165 Type 2 diabetes mellitus with hyperglycemia: Secondary | ICD-10-CM

## 2016-02-13 DIAGNOSIS — R1013 Epigastric pain: Secondary | ICD-10-CM

## 2016-02-13 DIAGNOSIS — IMO0002 Reserved for concepts with insufficient information to code with codable children: Secondary | ICD-10-CM

## 2016-02-13 DIAGNOSIS — Z23 Encounter for immunization: Secondary | ICD-10-CM

## 2016-02-13 DIAGNOSIS — Z0000 Encounter for general adult medical examination without abnormal findings: Secondary | ICD-10-CM

## 2016-02-13 DIAGNOSIS — I10 Essential (primary) hypertension: Secondary | ICD-10-CM | POA: Diagnosis not present

## 2016-02-13 DIAGNOSIS — G4733 Obstructive sleep apnea (adult) (pediatric): Secondary | ICD-10-CM

## 2016-02-13 DIAGNOSIS — E1151 Type 2 diabetes mellitus with diabetic peripheral angiopathy without gangrene: Secondary | ICD-10-CM | POA: Diagnosis not present

## 2016-02-13 LAB — POCT URINALYSIS DIPSTICK
BILIRUBIN UA: NEGATIVE
Blood, UA: NEGATIVE
GLUCOSE UA: NEGATIVE
KETONES UA: NEGATIVE
Leukocytes, UA: NEGATIVE
Nitrite, UA: NEGATIVE
Protein, UA: NEGATIVE
Urobilinogen, UA: 0.2
pH, UA: 6

## 2016-02-13 NOTE — Progress Notes (Signed)
Pre visit review using our clinic review tool, if applicable. No additional management support is needed unless otherwise documented below in the visit note. 

## 2016-02-13 NOTE — Patient Instructions (Signed)

## 2016-02-13 NOTE — Progress Notes (Signed)
Subjective:   Lucas Small is a 50 y.o. male who presents for Medicare Annual/Subsequent preventive examination.  Review of Systems:  Review of Systems  Constitutional: Negative for activity change, appetite change and fatigue.  HENT: Negative for hearing loss, congestion, tinnitus and ear discharge.  dentist [id number]: Negative for visual disturbance (see optho q1y -- vision corrected to 20/20 with glasses).  Respiratory: Negative for cough, chest tightness and shortness of breath.   Cardiovascular: Negative for chest pain, palpitations and leg swelling.  Gastrointestinal: Negative for abdominal pain, diarrhea, constipation and abdominal distention.  Genitourinary: Negative for urgency, frequency, decreased urine volume and difficulty urinating.  Musculoskeletal: Negative for back pain, arthralgias and gait problem.  Skin: Negative for color change, pallor and rash.  Neurological: Negative for dizziness, light-headedness, numbness and headaches.  Hematological: Negative for adenopathy. Does not bruise/bleed easily.  Psychiatric/Behavioral: Negative for suicidal ideas, confusion, sleep disturbance, self-injury, dysphoric mood, decreased concentration and agitation.            Objective:    Vitals: BP 121/84 (BP Location: Left Arm, Patient Position: Sitting, Cuff Size: Normal)   Pulse 94   Temp 98.1 F (36.7 C) (Oral)   Resp 17   Ht _0  (1.753 m)   Wt 209 lb (94.8 kg)   SpO2 95%   BMI 30.86 kg/m   Body mass index is 30.86 kg/m. BP 121/84 (BP Location: Left Arm, Patient Position: Sitting, Cuff Size: Normal)   Pulse 94   Temp 98.1 F (36.7 C) (Oral)   Resp 17   Ht _1  (1.753 m)   Wt 209 lb (94.8 kg)   SpO2 95%   BMI 30.86 kg/m  General appearance: alert, cooperative, appears stated age and no distress Head: Normocephalic, without obvious abnormality, atraumatic Eyes: negative findings: lids and lashes normal, conjunctivae and sclerae normal and pupils equal,  round, reactive to light and accomodation Ears: normal TM's and external ear canals both ears Nose: Nares normal. Septum midline. Mucosa normal. No drainage or sinus tenderness. Throat: lips, mucosa, and tongue normal; teeth and gums normal Neck: no adenopathy, no carotid bruit, no JVD, supple, symmetrical, trachea midline and thyroid not enlarged, symmetric, no tenderness/mass/nodules Back: symmetric, no curvature. ROM normal. No CVA tenderness. Lungs: clear to auscultation bilaterally Chest wall: no tenderness Heart: regular rate and rhythm, S1, S2 normal, no murmur, click, rub or gallop Abdomen: soft, non-tender; bowel sounds normal; no masses,  no organomegaly--- mult scars and hernia low abd  Male genitalia: deferred -- urology Rectal: deferred--urology Extremities: extremities normal, atraumatic, no cyanosis or edema Pulses: 2+ and symmetric Skin: Skin color, texture, turgor normal. No rashes or lesions Lymph nodes: Cervical, supraclavicular, and axillary nodes normal. Neurologic: Alert and oriented X 3, normal strength and tone. Normal symmetric reflexes. Normal coordination and gait Tobacco History  Smoking Status  . Current Every Day Smoker  . Packs/day: 1.00  . Years: 28.00  . Types: Cigarettes  Smokeless Tobacco  . Never Used     Ready to quit: Not Answered Counseling given: Not Answered   Past Medical History:  Diagnosis Date  . Alcoholic ([place]   . CHF (congestive heart failure) ([place]   . Depression   . Diabetes mellitus without complication ([place]   . Hypertension   . MRSA (methicillin resistant Staphylococcus aureus)   . Multiple myeloma ([facility]   . Perforation bowel ([place]   . PNA (pneumonia)   . Renal disorder   . Renal failure   . Renal  insufficiency   . Respiratory failure ([place])   . Sickle cell anemia (HCC)    Past Surgical History:  Procedure Laterality Date  . ABDOMINAL SURGERY    . CARDIAC SURGERY    . HERNIA REPAIR    . ILEOSTOMY    .  PERIPHERALLY INSERTED CENTRAL CATHETER INSERTION     No family history on file. History  Sexual Activity  . Sexual activity: Yes    Comment: Not asked    Outpatient Encounter Prescriptions as of 02/13/2016  Medication Sig  . acetaminophen (TYLENOL) 500 MG tablet Take 500 mg by mouth every 6 (six) hours as needed for moderate pain.   Marland Kitchen acetaminophen-codeine (TYLENOL #3) 300-30 MG tablet Take 1-2 tablets by mouth every 4 (four) hours as needed for moderate pain.  Marland Kitchen glucose blood (ONETOUCH VERIO) test strip Check Blood sugar twice daily. Dx:E11.9  . metFORMIN (GLUCOPHAGE) 500 MG tablet Take 1 tablet (500 mg total) by mouth 2 (two) times daily with a meal.  . ONETOUCH DELICA LANCETS FINE MISC Check Blood sugar twice daily. Dx:E11.9  . pantoprazole (PROTONIX) 40 MG tablet Take 1 tablet (40 mg total) by mouth 2 (two) times daily. Reported on 07/11/2015  . polyethylene glycol (MIRALAX / GLYCOLAX) packet Take 17 g by mouth daily as needed for mild constipation. Reported on 07/11/2015  . rosuvastatin (CRESTOR) 20 MG tablet TAKE 1 TABLET(20 MG) BY MOUTH DAILY  . traZODone (DESYREL) 50 MG tablet Take 1 tablet (50 mg total) by mouth at bedtime as needed for sleep.  . [DISCONTINUED] metFORMIN (GLUCOPHAGE) 500 MG tablet Take 1 tablet (500 mg total) by mouth 2 (two) times daily with a meal.  . [DISCONTINUED] pantoprazole (PROTONIX) 40 MG tablet Take 1 tablet (40 mg total) by mouth 2 (two) times daily. Reported on 07/11/2015  . [DISCONTINUED] rosuvastatin (CRESTOR) 20 MG tablet TAKE 1 TABLET(20 MG) BY MOUTH DAILY  . [DISCONTINUED] sulfamethoxazole-trimethoprim (BACTRIM DS,SEPTRA DS) 800-160 MG tablet Take 1 tablet by mouth 2 (two) times daily. Reported on 12/15/2015  . gabapentin (NEURONTIN) 100 MG capsule Take 100 mg by mouth 3 (three) times daily as needed (pain). Reported on 07/11/2015  . naproxen (NAPROSYN) 500 MG tablet Take 1 tablet (500 mg total) by mouth 2 (two) times daily with a meal. (Patient not  taking: Reported on 02/13/2016)  . [DISCONTINUED] senna (SENOKOT) 8.6 MG TABS tablet Take 1 tablet by mouth daily as needed for mild constipation. Reported on 12/15/2015   Facility-Administered Encounter Medications as of 02/13/2016  Medication  . folic acid (FOLVITE) tablet 1 mg  . haloperidol (HALDOL) tablet 5 mg  . LORazepam (ATIVAN) tablet 1 mg  . magnesium hydroxide (MILK OF MAGNESIA) suspension 30 mL  . multivitamin with minerals tablet 1 tablet    Activities of Daily Living In your present state of health, do you have any difficulty performing the following activities: 02/13/2016 08/18/2015  Hearing? N N  Vision? N Y  Difficulty concentrating or making decisions? N Y  Walking or climbing stairs? N N  Dressing or bathing? N N  Doing errands, shopping? N N  Some recent data might be hidden    Patient Care Team: Ann Held, DO as PCP - General (Family Medicine)   Assessment:     Exercise Activities and Dietary recommendations Current Exercise Habits: The patient does not participate in regular exercise at present, Exercise limited by: Other - see comments (mult hernias)some walking  Goals    None     Fall Risk  Fall Risk  02/13/2016 07/11/2015 11/24/2014  Falls in the past year? No No No   Depression Screen PHQ 2/9 Scores 02/13/2016 02/13/2016 07/11/2015 11/24/2014  PHQ - 2 Score 0 0 1 0    Cognitive Testing AAOx3 --  Answers all questions appropriately  Immunization History  Administered Date(s) Administered  . PPD Test 08/28/2011  . Pneumococcal Polysaccharide-23 02/13/2016   Screening Tests Health Maintenance  Topic Date Due  . TETANUS/TDAP  11/16/1985  . INFLUENZA VACCINE  08/17/2016 (Originally 12/27/2015)  . OPHTHALMOLOGY EXAM  02/12/2017 (Originally 11/16/1976)  . HEMOGLOBIN A1C  06/06/2016  . URINE MICROALBUMIN  12/04/2016  . FOOT EXAM  02/12/2017  . PNEUMOCOCCAL POLYSACCHARIDE VACCINE (2) 02/12/2021  . HIV Screening  Completed      Plan:      During the course of the visit the patient was educated and counseled about the following appropriate screening and preventive services:   Vaccines to include Pneumoccal, Influenza, Hepatitis B, Td, Zostavax, HCV  Electrocardiogram  Cardiovascular Disease  Colorectal cancer screening  Diabetes screening  Prostate Cancer Screening  Glaucoma screening  Nutrition counseling   Smoking cessation counseling  Patient Instructions (the written plan) was given to the patient.  - Microalbumin / creatinine urine ratio - Lipid panel - Hemoglobin A1c - CBC with Differential/Platelet - Comprehensive metabolic panel  1. Preventative health care See above - TSH - POCT urinalysis dipstick - Microalbumin / creatinine urine ratio - Lipid panel - Hemoglobin A1c - CBC with Differential/Platelet - Comprehensive metabolic panel  2. Hyperlipidemia Check labs  - TSH - POCT urinalysis dipstick - Microalbumin / creatinine urine ratio - Lipid panel - Hemoglobin A1c - CBC with Differential/Platelet - Comprehensive metabolic panel - rosuvastatin (CRESTOR) 20 MG tablet; TAKE 1 TABLET(20 MG) BY MOUTH DAILY  Dispense: 90 tablet; Refill: 1  3. Essential hypertension Stable con't meds - TSH - POCT urinalysis dipstick - Microalbumin / creatinine urine ratio - Lipid panel - Hemoglobin A1c - CBC with Differential/Platelet - Comprehensive metabolic panel  4. DM (diabetes mellitus) type II uncontrolled, periph vascular disorder (HCC) Check labs - TSH - POCT urinalysis dipstick - Microalbumin / creatinine urine ratio - Lipid panel - Hemoglobin A1c - CBC with Differential/Platelet - Comprehensive metabolic panel - metFORMIN (GLUCOPHAGE) 500 MG tablet; Take 1 tablet (500 mg total) by mouth 2 (two) times daily with a meal.  Dispense: 180 tablet; Refill: 1  5. Obstructive sleep apnea   - Ambulatory referral to Pulmonology  6. Routine history and physical examination of adult    7.  Dyspepsia   - pantoprazole (PROTONIX) 40 MG tablet; Take 1 tablet (40 mg total) by mouth 2 (two) times daily. Reported on 07/11/2015  Dispense: 180 tablet; Refill: [place], DO  02/14/2016

## 2016-02-14 LAB — COMPREHENSIVE METABOLIC PANEL
ALBUMIN: 4.1 g/dL (ref 3.5–5.2)
ALK PHOS: 97 U/L (ref 39–117)
ALT: 18 U/L (ref 0–53)
AST: 16 U/L (ref 0–37)
BILIRUBIN TOTAL: 0.4 mg/dL (ref 0.2–1.2)
BUN: 21 mg/dL (ref 6–23)
CO2: 28 mEq/L (ref 19–32)
Calcium: 9 mg/dL (ref 8.4–10.5)
Chloride: 105 mEq/L (ref 96–112)
Creatinine, Ser: 1.14 mg/dL (ref 0.40–1.50)
GFR: 72.49 mL/min (ref >60.00)
GLUCOSE: 106 mg/dL — AB (ref 70–99)
POTASSIUM: 4.3 meq/L (ref 3.5–5.1)
SODIUM: 138 meq/L (ref 135–145)
TOTAL PROTEIN: 7.2 g/dL (ref 6.0–8.3)

## 2016-02-14 LAB — MICROALBUMIN / CREATININE URINE RATIO
CREATININE, U: 172.5 mg/dL
MICROALB UR: 2.5 mg/dL — AB (ref 0.0–1.9)
MICROALB/CREAT RATIO: 1.4 mg/g (ref 0.0–30.0)

## 2016-02-14 LAB — CBC WITH DIFFERENTIAL/PLATELET
BASOS ABS: 0.1 10*3/uL (ref 0.0–0.1)
Basophils Relative: 0.8 % (ref 0.0–3.0)
EOS PCT: 4.6 % (ref 0.0–5.0)
Eosinophils Absolute: 0.5 10*3/uL (ref 0.0–0.7)
HCT: 45.2 % (ref 39.0–52.0)
HEMOGLOBIN: 15.6 g/dL (ref 13.0–17.0)
LYMPHS ABS: 2.3 10*3/uL (ref 0.7–4.0)
Lymphocytes Relative: 19 % (ref 12.0–46.0)
MCHC: 34.5 g/dL (ref 30.0–36.0)
MCV: 89.5 fl (ref 78.0–100.0)
MONO ABS: 0.6 10*3/uL (ref 0.1–1.0)
MONOS PCT: 4.6 % (ref 3.0–12.0)
NEUTROS PCT: 71 % (ref 43.0–77.0)
Neutro Abs: 8.5 10*3/uL — ABNORMAL HIGH (ref 1.4–7.7)
Platelets: 290 10*3/uL (ref 150.0–400.0)
RBC: 5.05 Mil/uL (ref 4.22–5.81)
RDW: 14.5 % (ref 11.5–15.5)
WBC: 11.9 10*3/uL — AB (ref 4.0–10.5)

## 2016-02-14 LAB — LIPID PANEL
CHOLESTEROL: 158 mg/dL (ref 0–200)
HDL: 35.2 mg/dL — ABNORMAL LOW (ref >39.00)
LDL Cholesterol: 94 mg/dL (ref 0–99)
NONHDL: 122.54
Total CHOL/HDL Ratio: 4
Triglycerides: 143 mg/dL (ref 0.0–149.0)
VLDL: 28.6 mg/dL (ref 0.0–40.0)

## 2016-02-14 LAB — HEMOGLOBIN A1C: HEMOGLOBIN A1C: 6.9 % — AB (ref 4.6–6.5)

## 2016-02-14 LAB — TSH: TSH: 1.59 u[IU]/mL (ref 0.35–4.50)

## 2016-02-14 MED ORDER — PANTOPRAZOLE SODIUM 40 MG PO TBEC: 40.0000 mg | DELAYED_RELEASE_TABLET | Freq: Two times a day (BID) | ORAL | 1 refills | Status: DC

## 2016-02-14 MED ORDER — ROSUVASTATIN CALCIUM 20 MG PO TABS: ORAL_TABLET | ORAL | 1 refills | Status: DC

## 2016-02-14 MED ORDER — METFORMIN HCL 500 MG PO TABS: 500.0000 mg | ORAL_TABLET | Freq: Two times a day (BID) | ORAL | 1 refills | Status: DC

## 2016-02-20 ENCOUNTER — Encounter: Payer: Self-pay | Admitting: Family Medicine

## 2016-02-20 DIAGNOSIS — E119 Type 2 diabetes mellitus without complications: Secondary | ICD-10-CM | POA: Diagnosis not present

## 2016-02-20 LAB — HM DIABETES EYE EXAM

## 2016-03-10 ENCOUNTER — Emergency Department (HOSPITAL_COMMUNITY): Payer: PPO

## 2016-03-10 ENCOUNTER — Emergency Department (HOSPITAL_COMMUNITY)
Admission: EM | Admit: 2016-03-10 | Discharge: 2016-03-10 | Disposition: A | Discharge status: Home / Self Care | Payer: PPO | Attending: Emergency Medicine | Admitting: Emergency Medicine

## 2016-03-10 ENCOUNTER — Encounter (HOSPITAL_COMMUNITY): Payer: Self-pay | Admitting: Emergency Medicine

## 2016-03-10 DIAGNOSIS — I509 Heart failure, unspecified: Secondary | ICD-10-CM | POA: Diagnosis not present

## 2016-03-10 DIAGNOSIS — F17210 Nicotine dependence, cigarettes, uncomplicated: Secondary | ICD-10-CM | POA: Diagnosis not present

## 2016-03-10 DIAGNOSIS — R109 Unspecified abdominal pain: Secondary | ICD-10-CM | POA: Insufficient documentation

## 2016-03-10 DIAGNOSIS — I110 Hypertensive heart disease with heart failure: Secondary | ICD-10-CM | POA: Insufficient documentation

## 2016-03-10 DIAGNOSIS — E119 Type 2 diabetes mellitus without complications: Secondary | ICD-10-CM | POA: Diagnosis not present

## 2016-03-10 DIAGNOSIS — Z7982 Long term (current) use of aspirin: Secondary | ICD-10-CM | POA: Insufficient documentation

## 2016-03-10 DIAGNOSIS — K859 Acute pancreatitis without necrosis or infection, unspecified: Secondary | ICD-10-CM

## 2016-03-10 DIAGNOSIS — N201 Calculus of ureter: Secondary | ICD-10-CM | POA: Insufficient documentation

## 2016-03-10 DIAGNOSIS — Z7984 Long term (current) use of oral hypoglycemic drugs: Secondary | ICD-10-CM | POA: Diagnosis not present

## 2016-03-10 DIAGNOSIS — M549 Dorsalgia, unspecified: Secondary | ICD-10-CM | POA: Diagnosis not present

## 2016-03-10 LAB — URINE MICROSCOPIC-ADD ON
Bacteria, UA: NONE SEEN
Squamous Epithelial / LPF: NONE SEEN

## 2016-03-10 LAB — URINALYSIS, ROUTINE W REFLEX MICROSCOPIC
BILIRUBIN URINE: NEGATIVE
Glucose, UA: NEGATIVE mg/dL
Ketones, ur: NEGATIVE mg/dL
Leukocytes, UA: NEGATIVE
Nitrite: NEGATIVE
PROTEIN: 100 mg/dL — AB
Specific Gravity, Urine: 1.035 — ABNORMAL HIGH (ref 1.005–1.030)
pH: 5.5 (ref 5.0–8.0)

## 2016-03-10 LAB — COMPREHENSIVE METABOLIC PANEL WITH GFR
ALT: 24 U/L (ref 17–63)
AST: 23 U/L (ref 15–41)
Albumin: 4.3 g/dL (ref 3.5–5.0)
Alkaline Phosphatase: 101 U/L (ref 38–126)
Anion gap: 12 (ref 5–15)
BUN: 22 mg/dL — ABNORMAL HIGH (ref 6–20)
CO2: 21 mmol/L — ABNORMAL LOW (ref 22–32)
Calcium: 9.7 mg/dL (ref 8.9–10.3)
Chloride: 99 mmol/L — ABNORMAL LOW (ref 101–111)
Creatinine, Ser: 1.13 mg/dL (ref 0.61–1.24)
GFR calc Af Amer: >60 mL/min
GFR calc non Af Amer: >60 mL/min
Glucose, Bld: 196 mg/dL — ABNORMAL HIGH (ref 65–99)
Potassium: 4.4 mmol/L (ref 3.5–5.1)
Sodium: 132 mmol/L — ABNORMAL LOW (ref 135–145)
Total Bilirubin: 0.6 mg/dL (ref 0.3–1.2)
Total Protein: 7.7 g/dL (ref 6.5–8.1)

## 2016-03-10 LAB — CBC
HEMATOCRIT: 47.9 % (ref 39.0–52.0)
HEMOGLOBIN: 16.7 g/dL (ref 13.0–17.0)
MCH: 31.4 pg (ref 26.0–34.0)
MCHC: 34.9 g/dL (ref 30.0–36.0)
MCV: 90 fL (ref 78.0–100.0)
PLATELETS: 238 10*3/uL (ref 150–400)
RBC: 5.32 MIL/uL (ref 4.22–5.81)
RDW: 13.5 % (ref 11.5–15.5)
WBC: 13.1 10*3/uL — ABNORMAL HIGH (ref 4.0–10.5)

## 2016-03-10 LAB — MAGNESIUM: Magnesium: 1.8 mg/dL (ref 1.7–2.4)

## 2016-03-10 LAB — LIPASE, BLOOD: Lipase: 35 U/L (ref 11–51)

## 2016-03-10 MED ORDER — KETOROLAC TROMETHAMINE 10 MG PO TABS: 10.0000 mg | ORAL_TABLET | Freq: Four times a day (QID) | ORAL | 0 refills | Status: DC | PRN

## 2016-03-10 MED ORDER — HYDROMORPHONE HCL 1 MG/ML IJ SOLN: 0.5000 mg | Freq: Once | INTRAMUSCULAR | Status: DC

## 2016-03-10 MED ORDER — SODIUM CHLORIDE 0.9 % IV BOLUS (SEPSIS)
1000.0000 mL | Freq: Once | INTRAVENOUS | Status: AC
  Administered 2016-03-10: 1000 mL via INTRAVENOUS

## 2016-03-10 MED ORDER — OXYCODONE-ACETAMINOPHEN 7.5-325 MG PO TABS: 1.0000 | ORAL_TABLET | Freq: Four times a day (QID) | ORAL | 0 refills | Status: DC | PRN

## 2016-03-10 MED ORDER — MORPHINE SULFATE (PF) 4 MG/ML IV SOLN
4.0000 mg | Freq: Once | INTRAVENOUS | Status: AC
  Administered 2016-03-10: 4 mg via INTRAVENOUS
  Filled 2016-03-10: qty 1

## 2016-03-10 MED ORDER — PROMETHAZINE HCL 25 MG PO TABS: 25.0000 mg | ORAL_TABLET | Freq: Four times a day (QID) | ORAL | 0 refills | Status: DC | PRN

## 2016-03-10 MED ORDER — ONDANSETRON HCL 4 MG/2ML IJ SOLN
4.0000 mg | Freq: Once | INTRAMUSCULAR | Status: AC
  Administered 2016-03-10: 4 mg via INTRAVENOUS
  Filled 2016-03-10: qty 2

## 2016-03-10 MED ORDER — KETOROLAC TROMETHAMINE 30 MG/ML IJ SOLN
30.0000 mg | Freq: Once | INTRAMUSCULAR | Status: AC
  Administered 2016-03-10: 30 mg via INTRAVENOUS
  Filled 2016-03-10: qty 1

## 2016-03-10 MED ORDER — HYDROMORPHONE HCL 1 MG/ML IJ SOLN: 1.0000 mg | Freq: Once | INTRAMUSCULAR | Status: DC

## 2016-03-10 NOTE — Discharge Instructions (Signed)
Return to the ED immediately if you develop fever, uncontrolled pain or vomiting, or other concerns. FOLLOW UP ON Monday WITH [facility]

## 2016-03-10 NOTE — ED Notes (Signed)
Patient transported to CT 

## 2016-03-10 NOTE — ED Notes (Signed)
Pt dressed waiting on pain meds.

## 2016-03-10 NOTE — ED Provider Notes (Signed)
[facility] DEPT Provider Note   CSN: [id number] Arrival date & time: 03/10/16  5701     History   Chief Complaint Chief Complaint  Patient presents with  . Back Pain    HPI Lucas Small is a 50 y.o. male who  has a past medical history of Alcoholic ([place]); CHF (congestive heart failure) ([place]); Depression; Diabetes mellitus without complication ([place]); Hypertension; MRSA (methicillin resistant Staphylococcus aureus); Multiple myeloma ([place]); Perforation bowel ([place]); PNA (pneumonia); Renal disorder; Renal failure; Renal insufficiency; Respiratory failure ([place]);Marland Kitchen He presents with a cc of R sided back pain. He has a hx of chronic back pain. Review of EMR shows that he was switched from oxycodone to Tylenol 3 back in July of 2017 In his upper, lower abdomen and suprapubic region. He denies any urinary symptoms, vomiting. The patient states that he frequently has back pain but that this is different and much worse. He has had a previous kidney stone and he states that this might be it, he is also had a history of pancreatitis and states that it also feels similar, and he is unsure of the etiology of his severe pain. Patient denies any active vomiting. He made a bowel movement yesterday. He does have a history of multiple abdominal surgeries and multiple abdominal scars are present. Patient had 2 beers this past Tuesday and states that about 10 days ago. He did have several beers and got drunk. He does not drink daily. He is a current daily smoker. He has had chills but denies fevers or myalgias. Patient has been using a heating pad on his back without relief of his symptoms. He states that he is extremely uncomfortable and cannot find a position of comfort. HPI  Past Medical History:  Diagnosis Date  . Alcoholic ([place])   . CHF (congestive heart failure) ([place])   . Depression   . Diabetes mellitus without complication (HCC)    metformin  . Dyspnea    with exertion .  Marland Kitchen Dysrhythmia    while  hospitalized in [place]  . History of kidney stones   . Hypertension   . MRSA (methicillin resistant Staphylococcus aureus)   . Multiple myeloma ([place]) 1990's  . Perforation bowel ([place])   . PNA (pneumonia)   . Renal disorder   . Renal failure   . Renal insufficiency   . Respiratory failure ([place])   . Sickle cell anemia (HCC)   . Sleep apnea    no CPAP machine    Patient Active Problem List   Diagnosis Date Noted  . Back pain 12/15/2015  . GERD (gastroesophageal reflux disease) 12/15/2015  . DM (diabetes mellitus) type II uncontrolled, periph vascular disorder ([place]) 12/15/2015  . Hyperlipidemia LDL goal <70 12/15/2015  . Severe sepsis(995.92) 03/05/2013  . Diverticulitis 03/05/2013  . Tobacco dependency 03/05/2013  . DOE (dyspnea on exertion) 03/05/2013  . Major depression, recurrent ([place]) 11/24/2012  . Snoring disorder 08/27/2011  . Substance induced mood disorder ([place]) 08/23/2011    Class: Acute  . Alcohol dependency ([place]) 08/23/2011    Class: Acute    Past Surgical History:  Procedure Laterality Date  . ABDOMINAL SURGERY    . CARDIAC SURGERY    . HERNIA REPAIR    . ILEOSTOMY    . PERIPHERALLY INSERTED CENTRAL CATHETER INSERTION         Home Medications    Prior to Admission medications   Medication Sig Start Date End Date Taking? Authorizing Provider  acetaminophen-codeine (TYLENOL #3) 300-30 MG  tablet Take 1-2 tablets by mouth every 4 (four) hours as needed for moderate pain. Patient taking differently: Take 2 tablets by mouth every 4 (four) hours as needed for moderate pain.  12/15/15  Yes Yvonne R Lowne Chase, DO  aspirin EC 81 MG tablet Take 162 mg by mouth at bedtime.   Yes Historical Provider, MD  gabapentin (NEURONTIN) 100 MG capsule Take 100 mg by mouth 3 (three) times daily as needed (pain). Reported on 07/11/2015   Yes Historical Provider, MD  metFORMIN (GLUCOPHAGE) 500 MG tablet Take 1 tablet (500 mg total) by mouth 2 (two) times daily with a meal.  02/14/16  Yes Rosalita Chessman Chase, DO  Multiple Vitamin (MULTIVITAMIN WITH MINERALS) TABS tablet Take 1 tablet by mouth daily.   Yes Historical Provider, MD  nicotine polacrilex (NICORETTE) 2 MG gum Take 2 mg by mouth every hour as needed for smoking cessation.   Yes Historical Provider, MD  nicotine polacrilex (NICORETTE) 4 MG gum Take 4 mg by mouth daily.   Yes Historical Provider, MD  pantoprazole (PROTONIX) 40 MG tablet Take 1 tablet (40 mg total) by mouth 2 (two) times daily. Reported on 07/11/2015 Patient taking differently: Take 40 mg by mouth 2 (two) times daily.  02/14/16  Yes Yvonne R Lowne Chase, DO  polyethylene glycol (MIRALAX / GLYCOLAX) packet Take 17 g by mouth daily as needed for mild constipation. Reported on 07/11/2015 Patient taking differently: Take 17 g by mouth daily as needed for mild constipation. Mix in 8 oz liquid and drink 08/18/15  Yes Yvonne R Lowne Chase, DO  rosuvastatin (CRESTOR) 20 MG tablet TAKE 1 TABLET(20 MG) BY MOUTH DAILY Patient taking differently: Take 20 mg by mouth daily. TAKE 1 TABLET(20 MG) BY MOUTH DAIL 02/14/16  Yes Yvonne R Lowne Chase, DO  traZODone (DESYREL) 50 MG tablet Take 1 tablet (50 mg total) by mouth at bedtime as needed for sleep. 07/11/15  Yes Micheline Chapman, NP  glucose blood (ONETOUCH VERIO) test strip Check Blood sugar twice daily. Dx:E11.9 11/01/15   Rosalita Chessman Chase, DO  ketorolac (TORADOL) 10 MG tablet Take 1 tablet (10 mg total) by mouth every 6 (six) hours as needed. FOR OBSTRUCTING KIDNEY STONE 03/10/16   Margarita Mail, PA-C  naproxen (NAPROSYN) 500 MG tablet Take 1 tablet (500 mg total) by mouth 2 (two) times daily with a meal. Patient not taking: Reported on 03/10/2016 07/11/15   Micheline Chapman, NP  [facility] DELICA LANCETS FINE MISC Check Blood sugar twice daily. Dx:E11.9 11/01/15   Rosalita Chessman Chase, DO  oxyCODONE-acetaminophen (PERCOCET) 7.5-325 MG tablet Take 1-2 tablets by mouth every 6 (six) hours as needed for severe pain.  03/10/16   Margarita Mail, PA-C  promethazine (PHENERGAN) 25 MG tablet Take 1 tablet (25 mg total) by mouth every 6 (six) hours as needed for nausea or vomiting. 03/10/16   Margarita Mail, PA-C    Family History History reviewed. No pertinent family history.  Social History Social History  Substance Use Topics  . Smoking status: Current Every Day Smoker    Packs/day: 1.00    Years: 28.00    Types: Cigarettes  . Smokeless tobacco: Never Used  . Alcohol use Yes     Comment: occasionally     Allergies   Bee venom   Review of Systems Review of Systems Ten systems reviewed and are negative for acute change, except as noted in the HPI.    Physical Exam Updated Vital Signs BP  130/88   Pulse 87   Temp 97.7 F (36.5 C) (Oral)   Resp 18   Ht _0  (1.753 m)   Wt 94.3 kg   SpO2 95%   BMI 30.72 kg/m   Physical Exam  Constitutional: He is oriented to person, place, and time. He appears well-developed and well-nourished.  Patient appears uncomfortable. He is squirming on the bed and cannot find a comfortable position.  HENT:  Head: Normocephalic and atraumatic.  Eyes: Pupils are equal, round, and reactive to light.  Neck: Normal range of motion. Neck supple.  Cardiovascular: Normal rate and regular rhythm.   Pulmonary/Chest: Effort normal and breath sounds normal.  Abdominal: Soft. He exhibits no distension.  Multiple well-healed abdominal surgical scars. Diffuse but mild abdominal tenderness, no bloating. Tender at the left CVA, however, no positive sign of withdrawal.  Musculoskeletal: Normal range of motion.  Patient with full range of motion of the back, no midline spinal tenderness, no paraspinal muscular tenderness.  Neurological: He is alert and oriented to person, place, and time.  Skin: Skin is warm and dry.  Nursing note and vitals reviewed.    ED Treatments / Results  Labs (all labs ordered are listed, but only abnormal results are displayed) Labs  Reviewed  COMPREHENSIVE METABOLIC PANEL - Abnormal; Notable for the following:       Result Value   Sodium 132 (*)    Chloride 99 (*)    CO2 21 (*)    Glucose, Bld 196 (*)    BUN 22 (*)    All other components within normal limits  CBC - Abnormal; Notable for the following:    WBC 13.1 (*)    All other components within normal limits  URINALYSIS, ROUTINE W REFLEX MICROSCOPIC (NOT AT [facility]) - Abnormal; Notable for the following:    Color, Urine AMBER (*)    APPearance CLOUDY (*)    Specific Gravity, Urine 1.035 (*)    Hgb urine dipstick LARGE (*)    Protein, ur 100 (*)    All other components within normal limits  LIPASE, BLOOD  MAGNESIUM  URINE MICROSCOPIC-ADD ON    EKG  EKG Interpretation None       Radiology No results found.  Procedures Procedures (including critical care time)  Medications Ordered in ED Medications  ondansetron (ZOFRAN) injection 4 mg (4 mg Intravenous Given 03/10/16 1150)  sodium chloride 0.9 % bolus 1,000 mL (0 mLs Intravenous Stopped 03/10/16 1427)  morphine 4 MG/ML injection 4 mg (4 mg Intravenous Given 03/10/16 1150)  morphine 4 MG/ML injection 4 mg (4 mg Intravenous Given 03/10/16 1426)  ketorolac (TORADOL) 30 MG/ML injection 30 mg (30 mg Intravenous Given 03/10/16 1426)  morphine 4 MG/ML injection 4 mg (4 mg Intravenous Given 03/10/16 1618)     Initial Impression / Assessment and Plan / ED Course  I have reviewed the triage vital signs and the nursing notes.  Pertinent labs & imaging results that were available during my care of the patient were reviewed by me and considered in my medical decision making (see chart for details).  Clinical Course   Patient with 9 mm obstructing renal stone, and evidence of pancreatitis on CT scan, although no elevation in his lipase. I've spoken with the on-call urologist, Dr. Otho Ket who feels that if his pain is under control. They'll vomiting. He can be sent home to follow-up first thing Monday  morning. He has put a note in for [facility]. Patient has an established  relationship with Dr. Mikle Bosworth. The patient is comfortable with this plan. His pain is well controlled at this time. He'll be discharged with pain medications, antibiotics. He had significant improvement in his symptoms with Toradol. Patient will have by mouth Toradol at discharge as well. No evidence of urinary tract infection. No active vomiting. Discussed return precautions.   Final Clinical Impressions(s) / ED Diagnoses   Final diagnoses:  Left ureteral stone  Acute pancreatitis, unspecified complication status, unspecified pancreatitis type    New Prescriptions Discharge Medication List as of 03/10/2016  4:11 PM    START taking these medications   Details  ketorolac (TORADOL) 10 MG tablet Take 1 tablet (10 mg total) by mouth every 6 (six) hours as needed. FOR OBSTRUCTING KIDNEY STONE, Starting Sat 03/10/2016, Print    oxyCODONE-acetaminophen (PERCOCET) 7.5-325 MG tablet Take 1-2 tablets by mouth every 6 (six) hours as needed for severe pain., Starting Sat 03/10/2016, Print    promethazine (PHENERGAN) 25 MG tablet Take 1 tablet (25 mg total) by mouth every 6 (six) hours as needed for nausea or vomiting., Starting Sat 03/10/2016, Print         [place], PA-C 03/15/16 [place], MD 03/23/16 [phone]

## 2016-03-10 NOTE — ED Triage Notes (Signed)
Pt states he has been having left back pain and night sweats for several days. Pt has also been nauseated. Pt has had pancreatitis before and is concerned he may have it again. Pt has not felt well and has been feeling weak.

## 2016-03-12 DIAGNOSIS — N201 Calculus of ureter: Secondary | ICD-10-CM | POA: Diagnosis not present

## 2016-03-13 ENCOUNTER — Other Ambulatory Visit: Payer: Self-pay | Admitting: Urology

## 2016-03-13 DIAGNOSIS — Z202 Contact with and (suspected) exposure to infections with a predominantly sexual mode of transmission: Secondary | ICD-10-CM | POA: Diagnosis not present

## 2016-03-13 DIAGNOSIS — B009 Herpesviral infection, unspecified: Secondary | ICD-10-CM | POA: Diagnosis not present

## 2016-03-14 ENCOUNTER — Encounter (HOSPITAL_COMMUNITY): Payer: Self-pay | Admitting: *Deleted

## 2016-03-15 ENCOUNTER — Encounter (HOSPITAL_COMMUNITY)
Admission: RE | Admit: 2016-03-15 | Discharge: 2016-03-15 | Disposition: A | Discharge status: Home / Self Care | Payer: PPO | Source: Ambulatory Visit | Attending: Urology | Admitting: Urology

## 2016-03-15 DIAGNOSIS — N201 Calculus of ureter: Secondary | ICD-10-CM | POA: Diagnosis not present

## 2016-03-15 DIAGNOSIS — F17200 Nicotine dependence, unspecified, uncomplicated: Secondary | ICD-10-CM | POA: Diagnosis not present

## 2016-03-15 DIAGNOSIS — E785 Hyperlipidemia, unspecified: Secondary | ICD-10-CM | POA: Diagnosis not present

## 2016-03-15 DIAGNOSIS — Z7984 Long term (current) use of oral hypoglycemic drugs: Secondary | ICD-10-CM | POA: Diagnosis not present

## 2016-03-15 DIAGNOSIS — E119 Type 2 diabetes mellitus without complications: Secondary | ICD-10-CM | POA: Diagnosis not present

## 2016-03-15 DIAGNOSIS — Z79899 Other long term (current) drug therapy: Secondary | ICD-10-CM | POA: Diagnosis not present

## 2016-03-15 DIAGNOSIS — Z01818 Encounter for other preprocedural examination: Secondary | ICD-10-CM | POA: Diagnosis not present

## 2016-03-19 ENCOUNTER — Encounter (HOSPITAL_COMMUNITY)
Admission: RE | Disposition: A | Discharge status: Home / Self Care | Payer: Self-pay | Source: Ambulatory Visit | Attending: Urology

## 2016-03-19 ENCOUNTER — Ambulatory Visit (HOSPITAL_COMMUNITY): Payer: PPO

## 2016-03-19 ENCOUNTER — Ambulatory Visit (HOSPITAL_COMMUNITY)
Admission: RE | Admit: 2016-03-19 | Discharge: 2016-03-19 | Disposition: A | Discharge status: Home / Self Care | Payer: PPO | Source: Ambulatory Visit | Attending: Urology | Admitting: Urology

## 2016-03-19 DIAGNOSIS — N201 Calculus of ureter: Secondary | ICD-10-CM | POA: Insufficient documentation

## 2016-03-19 DIAGNOSIS — Z01818 Encounter for other preprocedural examination: Secondary | ICD-10-CM | POA: Diagnosis not present

## 2016-03-19 DIAGNOSIS — Z79899 Other long term (current) drug therapy: Secondary | ICD-10-CM | POA: Insufficient documentation

## 2016-03-19 DIAGNOSIS — E785 Hyperlipidemia, unspecified: Secondary | ICD-10-CM | POA: Insufficient documentation

## 2016-03-19 DIAGNOSIS — F17200 Nicotine dependence, unspecified, uncomplicated: Secondary | ICD-10-CM | POA: Insufficient documentation

## 2016-03-19 DIAGNOSIS — Z7984 Long term (current) use of oral hypoglycemic drugs: Secondary | ICD-10-CM | POA: Insufficient documentation

## 2016-03-19 DIAGNOSIS — E119 Type 2 diabetes mellitus without complications: Secondary | ICD-10-CM | POA: Insufficient documentation

## 2016-03-19 HISTORY — DX: Dyspnea, unspecified: R06.00

## 2016-03-19 HISTORY — DX: Cardiac arrhythmia, unspecified: I49.9

## 2016-03-19 HISTORY — DX: Sleep apnea, unspecified: G47.30

## 2016-03-19 HISTORY — DX: Personal history of urinary calculi: Z87.442

## 2016-03-19 LAB — GLUCOSE, CAPILLARY: Glucose-Capillary: 155 mg/dL — ABNORMAL HIGH (ref 65–99)

## 2016-03-19 SURGERY — LITHOTRIPSY, ESWL
Anesthesia: LOCAL | Laterality: Left

## 2016-03-19 MED ORDER — SODIUM CHLORIDE 0.9 % IV SOLN: INTRAVENOUS | Status: DC

## 2016-03-19 MED ORDER — CEFAZOLIN SODIUM-DEXTROSE 2-4 GM/100ML-% IV SOLN
2.0000 g | INTRAVENOUS | Status: DC
  Filled 2016-03-19: qty 100

## 2016-03-19 MED ORDER — DIPHENHYDRAMINE HCL 25 MG PO CAPS: 25.0000 mg | ORAL_CAPSULE | ORAL | Status: DC

## 2016-03-19 MED ORDER — DIAZEPAM 5 MG PO TABS: 10.0000 mg | ORAL_TABLET | ORAL | Status: DC

## 2016-03-19 NOTE — H&P (Signed)
HPI: Lucas Small is a 50 year-old male with a left ureteral stone.  The problem is on the left side. . This is not his first kidney stone. He is currently having flank pain. He denies having back pain, groin pain, nausea, vomiting, fever, and chills. He has not caught a stone in his urine strainer since his symptoms began.   He has had ureteral stent for treatment of his stones in the past.   Presented to the emergency room 2 days agoWith lower left-sided abdominal discomfort. CT scan showed a 9 mm x 7 mm left proximal ureteral stone. You can see the stone on [facility]. I reviewed his report as well as the imaging studies. Pain relatvely mild now.     ALLERGIES: Bee Venom Protein (Honey Bee)    MEDICATIONS: Metformin Hcl  Viagra 50 mg tablet 1 tablet PO Daily PRN 60 min before planned use. Use 1/2 tablet initially.  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/2015 Low back pain, Back pain, lumbosacral - 06/24/2015 Other ejaculatory dysfunction, Abnormal ejaculation - 06/24/2015 Personal Hx Oth Urinary System diseases, History of renal failure - 06/24/2015    NON-GU PMH: Encounter for general adult medical examination without abnormal findings, Encounter for preventive health examination - 06/24/2015 Personal history of other diseases of the circulatory system, History of heart failure - 06/24/2015, History of atrial fibrillation, - 06/24/2015, History of cardiac arrhythmia, - 06/24/2015 Personal history of other diseases of the nervous system and sense organs, History of  sleep apnea - 06/24/2015 Personal history of other infectious and parasitic diseases, History of septic shock - 06/24/2015    FAMILY HISTORY: No pertinent family history - Runs In Family   SOCIAL HISTORY: Marital Status: Divorced Current Smoking Status: Patient smokes.  Light Drinker.  Drinks 3 caffeinated drinks per day.     Notes: Disabled, Father deceased, Current smoker, Divorced, Patient's mother is still living   REVIEW OF SYSTEMS:    GU Review Male:   Patient denies leakage of urine, frequent urination, penile pain, burning/ pain with urination, erection problems, trouble starting your stream, get up at night to urinate, have to strain to urinate , hard to postpone urination, and stream starts and stops.  Gastrointestinal (Upper):   Patient reports nausea. Patient denies vomiting and indigestion/ heartburn.  Gastrointestinal (Lower):   Patient reports constipation. Patient denies diarrhea.  Constitutional:   Patient reports night sweats. Patient denies fever, weight loss, and fatigue.  Skin:   Patient denies skin rash/ lesion and itching.  Eyes:   Patient denies blurred vision and double vision.  Ears/ Nose/ Throat:   Patient denies sore throat and sinus problems.  Hematologic/Lymphatic:   Patient denies swollen glands and easy bruising.  Cardiovascular:   Patient denies leg swelling and chest pains.  Respiratory:   Patient reports shortness of breath. Patient denies cough.  Endocrine:   Patient reports excessive thirst.   Musculoskeletal:   Patient reports back pain. Patient denies joint pain.  Neurological:   Patient reports dizziness. Patient denies headaches.  Psychologic:   Patient denies depression and anxiety.   VITAL SIGNS:  Weight 208 lb / 94.35 kg  Height 69 in / 175.26 cm  BP 132/88 mmHg  Pulse 96 /min  Temperature 98.2 F / 37 C  BMI 30.7 kg/m   MULTI-SYSTEM PHYSICAL EXAMINATION:    Constitutional: Well-nourished. No physical deformities. Normally developed.  Good grooming.  Neck: Neck symmetrical, not swollen. Normal tracheal position.  Respiratory: No labored breathing, no use of accessory muscles.   Cardiovascular: Normal temperature, normal extremity pulses, no swelling, no varicosities.  Neurologic / Psychiatric: Oriented to time, oriented to place, oriented to person. No depression, no anxiety, no agitation.  Gastrointestinal: No mass, no tenderness, no rigidity, non obese abdomen.  Ears, Nose, Mouth, and Throat: Left ear no scars, no lesions, no masses. Right ear no scars, no lesions, no masses. Nose no scars, no lesions, no masses. Normal hearing. Normal lips.  Musculoskeletal: Normal gait and station of head and neck.    ASSESSMENT:      ICD-10 Details  1 GU:   Calculus Ureter - N20.1    PLAN:       Notes:   9 mm x7 mmleft proximal ureteral stone. No indication for urgent intervention. Pain reasonably well controlled. This appears to be ideally suited forESWL. We'll recommend next available appointment. We'll renew oxycodone.  Risk benefits discussed with patient . He agrees to proceed with ESWL. He will be present to our office or the emergency room if his clinical situation declines.

## 2016-03-19 NOTE — Progress Notes (Signed)
Pt arrived today for his EWSL and stated he drank "7 oz tea at 0600" Pt is scheduled for 0845 procedure. Reminded patient that he could have clear liquids up to 4 hours prior to procedure and he could have a 'sip of water' with his medication. Reported to Harlin Heys RN on ESWL [employer] truck and she reported it back to Dr Karsten Ro. Procedure was canceled by Dr Karsten Ro and patient was discharged with his blue folder

## 2016-03-20 ENCOUNTER — Other Ambulatory Visit: Payer: Self-pay | Admitting: Urology

## 2016-03-23 ENCOUNTER — Encounter (HOSPITAL_COMMUNITY): Payer: Self-pay

## 2016-03-26 ENCOUNTER — Ambulatory Visit (HOSPITAL_COMMUNITY)
Admission: RE | Admit: 2016-03-26 | Discharge: 2016-03-26 | Disposition: A | Discharge status: Home / Self Care | Payer: PPO | Source: Ambulatory Visit | Attending: Urology | Admitting: Urology

## 2016-03-26 ENCOUNTER — Ambulatory Visit (HOSPITAL_COMMUNITY): Payer: PPO

## 2016-03-26 ENCOUNTER — Encounter (HOSPITAL_COMMUNITY): Payer: Self-pay | Admitting: *Deleted

## 2016-03-26 ENCOUNTER — Encounter (HOSPITAL_COMMUNITY)
Admission: RE | Disposition: A | Discharge status: Home / Self Care | Payer: Self-pay | Source: Ambulatory Visit | Attending: Urology

## 2016-03-26 DIAGNOSIS — F17210 Nicotine dependence, cigarettes, uncomplicated: Secondary | ICD-10-CM | POA: Diagnosis not present

## 2016-03-26 DIAGNOSIS — Z7984 Long term (current) use of oral hypoglycemic drugs: Secondary | ICD-10-CM | POA: Diagnosis not present

## 2016-03-26 DIAGNOSIS — Z87442 Personal history of urinary calculi: Secondary | ICD-10-CM | POA: Insufficient documentation

## 2016-03-26 DIAGNOSIS — N201 Calculus of ureter: Secondary | ICD-10-CM | POA: Diagnosis not present

## 2016-03-26 DIAGNOSIS — E119 Type 2 diabetes mellitus without complications: Secondary | ICD-10-CM | POA: Insufficient documentation

## 2016-03-26 DIAGNOSIS — N200 Calculus of kidney: Secondary | ICD-10-CM | POA: Diagnosis not present

## 2016-03-26 DIAGNOSIS — I509 Heart failure, unspecified: Secondary | ICD-10-CM | POA: Diagnosis not present

## 2016-03-26 DIAGNOSIS — Z7982 Long term (current) use of aspirin: Secondary | ICD-10-CM | POA: Diagnosis not present

## 2016-03-26 DIAGNOSIS — I110 Hypertensive heart disease with heart failure: Secondary | ICD-10-CM | POA: Insufficient documentation

## 2016-03-26 DIAGNOSIS — F329 Major depressive disorder, single episode, unspecified: Secondary | ICD-10-CM | POA: Insufficient documentation

## 2016-03-26 DIAGNOSIS — G4730 Sleep apnea, unspecified: Secondary | ICD-10-CM | POA: Diagnosis not present

## 2016-03-26 LAB — GLUCOSE, CAPILLARY: Glucose-Capillary: 115 mg/dL — ABNORMAL HIGH (ref 65–99)

## 2016-03-26 SURGERY — LITHOTRIPSY, ESWL
Anesthesia: LOCAL | Laterality: Left

## 2016-03-26 MED ORDER — CEFAZOLIN SODIUM-DEXTROSE 2-4 GM/100ML-% IV SOLN
2.0000 g | INTRAVENOUS | Status: AC
  Administered 2016-03-26: 2 g via INTRAVENOUS
  Filled 2016-03-26: qty 100

## 2016-03-26 MED ORDER — DIPHENHYDRAMINE HCL 25 MG PO CAPS
25.0000 mg | ORAL_CAPSULE | ORAL | Status: AC
  Administered 2016-03-26: 25 mg via ORAL
  Filled 2016-03-26: qty 1

## 2016-03-26 MED ORDER — DIAZEPAM 5 MG PO TABS
10.0000 mg | ORAL_TABLET | ORAL | Status: AC
  Administered 2016-03-26: 10 mg via ORAL
  Filled 2016-03-26: qty 2

## 2016-03-26 MED ORDER — OXYCODONE-ACETAMINOPHEN 7.5-325 MG PO TABS: 1.0000 | ORAL_TABLET | Freq: Four times a day (QID) | ORAL | 0 refills | Status: DC | PRN

## 2016-03-26 MED ORDER — SODIUM CHLORIDE 0.9 % IV SOLN
INTRAVENOUS | Status: DC
  Administered 2016-03-26: 07:00:00 via INTRAVENOUS

## 2016-03-26 NOTE — H&P (Signed)
Lucas Small is an 50 y.o. male.    Chief Complaint: Pre-op LEFT shockwave lithotripsy  HPI:   1 - LEFT Ureteral Stone - Left 48m UPJ stone by ER CT 02/2016 on eval left flank pain. Stone is solitary, 500HU, SSD 12cm. UA without infectious parameters.  Today "[name] is seen to proceed with LEFT shockwave lithotripsy. No interval fevers.   Past Medical History:  Diagnosis Date  . Alcoholic ([place]   . CHF (congestive heart failure) ([facility]   . Depression   . Diabetes mellitus without complication (HCC)    metformin  . Dyspnea    with exertion .  .Marland KitchenDysrhythmia    [place] . History of kidney stones   . Hypertension   . MRSA (methicillin resistant Staphylococcus aureus)   . [place] 1990's  . Perforation bowel ([place]   . PNA (pneumonia)   . Renal disorder   . Renal failure   . Renal insufficiency   . Respiratory failure ([place]   . Sickle cell anemia (HCC)   . Sleep apnea    no CPAP machine    Past Surgical History:  Procedure Laterality Date  . ABDOMINAL SURGERY    . CARDIAC SURGERY    . HERNIA REPAIR    . ILEOSTOMY    . PERIPHERALLY INSERTED CENTRAL CATHETER INSERTION      History reviewed. No pertinent family history. Social History:  reports that he has been smoking Cigarettes.  He has a 28.00 pack-year smoking history. He has never used smokeless tobacco. He reports that he drinks alcohol. He reports that he does not use drugs.  Allergies:  Allergies  Allergen Reactions  . Bee Venom Anaphylaxis    Medications Prior to Admission  Medication Sig Dispense Refill  . acetaminophen-codeine (TYLENOL #3) 300-30 MG tablet Take 1-2 tablets by mouth every 4 (four) hours as needed for moderate pain. (Patient taking differently: Take 2 tablets by mouth every 4 (four) hours as needed for moderate pain. ) 90 tablet 0  . aspirin EC 81 MG tablet Take 162 mg by mouth at bedtime.    . gabapentin (NEURONTIN) 100 MG capsule Take 100 mg by  mouth 3 (three) times daily as needed (pain). Reported on 07/11/2015    . glucose blood (ONETOUCH VERIO) test strip Check Blood sugar twice daily. Dx:E11.9 100 each 12  . ketorolac (TORADOL) 10 MG tablet Take 1 tablet (10 mg total) by mouth every 6 (six) hours as needed. FOR OBSTRUCTING KIDNEY STONE 20 tablet 0  . metFORMIN (GLUCOPHAGE) 500 MG tablet Take 1 tablet (500 mg total) by mouth 2 (two) times daily with a meal. 180 tablet 1  . Multiple Vitamin (MULTIVITAMIN WITH MINERALS) TABS tablet Take 1 tablet by mouth daily.    . naproxen (NAPROSYN) 500 MG tablet Take 1 tablet (500 mg total) by mouth 2 (two) times daily with a meal. (Patient not taking: Reported on 03/10/2016) 30 tablet 0  . nicotine polacrilex (NICORETTE) 2 MG gum Take 2 mg by mouth every hour as needed for smoking cessation.    . nicotine polacrilex (NICORETTE) 4 MG gum Take 4 mg by mouth daily.    .[name] LANCETS FINE MISC Check Blood sugar twice daily. Dx:E11.9 100 each 12  . oxyCODONE-acetaminophen (PERCOCET) 7.5-325 MG tablet Take 1-2 tablets by mouth every 6 (six) hours as needed for severe pain. 20 tablet 0  . pantoprazole (PROTONIX) 40 MG tablet Take 1 tablet (40 mg total) by  mouth 2 (two) times daily. Reported on 07/11/2015 (Patient taking differently: Take 40 mg by mouth 2 (two) times daily. ) 180 tablet 1  . polyethylene glycol (MIRALAX / GLYCOLAX) packet Take 17 g by mouth daily as needed for mild constipation. Reported on 07/11/2015 (Patient taking differently: Take 17 g by mouth daily as needed for mild constipation. Mix in 8 oz liquid and drink) 14 each 1  . promethazine (PHENERGAN) 25 MG tablet Take 1 tablet (25 mg total) by mouth every 6 (six) hours as needed for nausea or vomiting. 12 tablet 0  . rosuvastatin (CRESTOR) 20 MG tablet TAKE 1 TABLET(20 MG) BY MOUTH DAILY (Patient taking differently: Take 20 mg by mouth daily. TAKE 1 TABLET(20 MG) BY MOUTH DAIL) 90 tablet 1  . traZODone (DESYREL) 50 MG tablet Take 1  tablet (50 mg total) by mouth at bedtime as needed for sleep. 30 tablet 3    No results found for this or any previous visit (from the past 48 hour(s)). No results found.  Review of Systems  Constitutional: Negative.  Negative for fever.  HENT: Negative.   Eyes: Negative.   Respiratory: Negative.   Cardiovascular: Negative.   Gastrointestinal: Negative.   Genitourinary: Negative.   Musculoskeletal: Negative.   Skin: Negative.   Neurological: Negative.   Endo/Heme/Allergies: Negative.   Psychiatric/Behavioral: Negative.     Blood pressure 115/81, pulse 100, temperature 97.5 F (36.4 C), temperature source Oral, resp. rate 18, SpO2 96 %. Physical Exam  Constitutional: He appears well-developed.  HENT:  Head: Normocephalic.  Eyes: Pupils are equal, round, and reactive to light.  Neck: Normal range of motion.  Cardiovascular: Normal rate.   Respiratory: Effort normal.  GI: Soft.  Genitourinary:  Genitourinary Comments: Moderate left CVAT  Musculoskeletal: Normal range of motion.  Neurological: He is alert.  Skin: Skin is warm.  Psychiatric: He has a normal mood and affect.     Assessment/Plan   1 - LEFT Ureteral Stone - proceed as planned with LEFT ESWL. Risks, benefits, peri-procedure course discussed previously and reiterated today.   Alexis Frock, MD 03/26/2016, 6:19 AM

## 2016-03-26 NOTE — Brief Op Note (Signed)
03/26/2016  8:43 AM  PATIENT:  Lucas Small  50 y.o. male  PRE-OPERATIVE DIAGNOSIS:  LEFT PROXIMAL URETERAL STONE  POST-OPERATIVE DIAGNOSIS:  * No post-op diagnosis entered *  PROCEDURE:  Procedure(s): LEFT EXTRACORPOREAL SHOCK WAVE LITHOTRIPSY (ESWL) (Left)  SURGEON:  Surgeon(s) and Role:    * Alexis Frock, MD - Primary  PHYSICIAN ASSISTANT:   ASSISTANTS: none   ANESTHESIA:   MAC  EBL:  No intake/output data recorded.  BLOOD ADMINISTERED:none  DRAINS: none   LOCAL MEDICATIONS USED:  NONE  SPECIMEN:  No Specimen  DISPOSITION OF SPECIMEN:  N/A  COUNTS:  YES  TOURNIQUET:  * No tourniquets in log *  DICTATION: .Note written in paper chart  PLAN OF CARE: Discharge to home after PACU  PATIENT DISPOSITION:  Short Stay   Delay start of Pharmacological VTE agent (>24hrs) due to surgical blood loss or risk of bleeding: yes

## 2016-03-26 NOTE — Discharge Instructions (Signed)
1 - You may have urinary urgency (bladder spasms),  bloody urine on / off, and pass small stone fragements x few days. This is normal.  2 - Call MD or go to ER for fever >102, severe pain / nausea / vomiting not relieved by medications, or acute change in medical status

## 2016-04-13 ENCOUNTER — Institutional Professional Consult (permissible substitution): Payer: Self-pay | Admitting: Pulmonary Disease

## 2016-04-13 DIAGNOSIS — N201 Calculus of ureter: Secondary | ICD-10-CM | POA: Diagnosis not present

## 2016-04-25 ENCOUNTER — Encounter: Payer: Self-pay | Admitting: Pulmonary Disease

## 2016-04-25 ENCOUNTER — Ambulatory Visit (INDEPENDENT_AMBULATORY_CARE_PROVIDER_SITE_OTHER): Payer: PPO | Admitting: Pulmonary Disease

## 2016-04-25 VITALS — BP 114/80 | HR 101 | Ht 69.0 in | Wt 205.4 lb

## 2016-04-25 DIAGNOSIS — G4733 Obstructive sleep apnea (adult) (pediatric): Secondary | ICD-10-CM

## 2016-04-25 DIAGNOSIS — F17200 Nicotine dependence, unspecified, uncomplicated: Secondary | ICD-10-CM

## 2016-04-25 NOTE — Patient Instructions (Signed)
  It was a pleasure taking care of you today!  You are diagnosed with Obstructive Sleep Apnea or OSA.  You stop breathing  33  times an hour.   We will order you an autoCPAP  machine.  Please call the office if you do NOT receive your machine in the next 1-2 weeks.   Please make sure you use your CPAP device everytime you sleep.  We will monitor the usage of your machine per your insurance requirement.  Your insurance company may take the machine from you if you are not using it regularly.   Please clean the mask, tubings, filter, water reservoir with soapy water every week.  Please use distilled water for the water reservoir.   Please call the office or your machine provider (DME company) if you are having issues with the device.   Return to clinic in 6-8 weeks with Dr. Corrie Dandy or NP.

## 2016-04-25 NOTE — Progress Notes (Signed)
Subjective:    Patient ID: Lucas Small, male    DOB: May 20, 1966, 50 y.o.   MRN: [id number]  HPI  This is the case of Lucas Small, 50 y.o. Male, who was referred by Dr. Carollee Herter  in consultation regarding OSA.   As you very well know, patient has a 30 PY smoking history, smokes 1/2 PPD, known to have asthma as a child, has not bothered him.    Patient has snoring, gasping, witnessed apneas, choking.  He is diabled 2/2 muscular issues/weakness. He sleeps "3 hrs" in a day. He has frequent awakenings. 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 Patient had a diagnostic sleep study done on 07/04/2015 which showed AHI of 35.  He denies depression or anxiety.   His ex-wife has OSA and uses cpap.  He thinks he can try cpap.   He has been admitted several times and cpap has been tried and he tolerated it.   Review of Systems  Constitutional: Negative.  Negative for fever and unexpected weight change.  HENT: Positive for congestion, dental problem and trouble swallowing. Negative for ear pain, nosebleeds, postnasal drip, rhinorrhea, sinus pressure, sneezing and sore throat.   Eyes: Negative.  Negative for redness and itching.  Respiratory: Positive for cough and shortness of breath. Negative for chest tightness and wheezing.   Cardiovascular: Negative.  Negative for palpitations and leg swelling.  Gastrointestinal: Positive for nausea. Negative for vomiting.  Endocrine: Negative.   Genitourinary: Negative.  Negative for dysuria.  Musculoskeletal: Negative.  Negative for joint swelling.  Skin: Negative.  Negative for rash.  Allergic/Immunologic: Positive for environmental allergies.  Neurological: Negative.  Negative for headaches.  Hematological: Negative.  Does not bruise/bleed easily.  Psychiatric/Behavioral: Negative.  Negative for dysphoric mood. The patient is not nervous/anxious.    Past Medical History:  Diagnosis Date  . Alcoholic ([place])   . CHF (congestive  heart failure) ([place])   . Depression   . Diabetes mellitus without complication (HCC)    metformin  . Dyspnea    with exertion .  Marland Kitchen Dysrhythmia    while hospitalized in [place]  . History of kidney stones   . Hypertension   . MRSA (methicillin resistant Staphylococcus aureus)   . Multiple myeloma ([place]) 1990's  . Perforation bowel ([place])   . PNA (pneumonia)   . Renal disorder   . Renal failure   . Renal insufficiency   . Respiratory failure ([place])   . Sickle cell anemia (HCC)   . Sleep apnea    no CPAP machine  (+) skin CA (-) DVT Pt had respiratory failure requiring trache in 2009 when he was admitted for sepsis.   No family history on file.  Mother has CAD and DM. Father > no information.    Past Surgical History:  Procedure Laterality Date  . ABDOMINAL SURGERY    . CARDIAC SURGERY    . HERNIA REPAIR    . ILEOSTOMY    . PERIPHERALLY INSERTED CENTRAL CATHETER INSERTION      Social History   Social History  . Marital status: Divorced    Spouse name: N/A  . Number of children: N/A  . Years of education: N/A   Occupational History  .      disabled   Social History Main Topics  . Smoking status: Current Some Day Smoker    Packs/day: 1.00    Years: 28.00    Types: Cigarettes  . Smokeless tobacco: Never Used  . Alcohol use  Yes     Comment: occasionally  . Drug use: No     Comment: THC cocaine  . Sexual activity: Yes     Comment: Not asked   Other Topics Concern  . Not on file   Social History Narrative   Exercise---  Walking some --  Not enough   Used to run a [employer]   Allergies  Allergen Reactions  . Bee Venom Anaphylaxis     Outpatient Medications Prior to Visit  Medication Sig Dispense Refill  . aspirin EC 81 MG tablet Take 162 mg by mouth at bedtime.    Marland Kitchen glucose blood (ONETOUCH VERIO) test strip Check Blood sugar twice daily. Dx:E11.9 100 each 12  . ketorolac (TORADOL) 10 MG tablet Take 1 tablet (10 mg total) by mouth every 6  (six) hours as needed. FOR OBSTRUCTING KIDNEY STONE 20 tablet 0  . metFORMIN (GLUCOPHAGE) 500 MG tablet Take 1 tablet (500 mg total) by mouth 2 (two) times daily with a meal. 180 tablet 1  . Multiple Vitamin (MULTIVITAMIN WITH MINERALS) TABS tablet Take 1 tablet by mouth daily.    . nicotine polacrilex (NICORETTE) 2 MG gum Take 2 mg by mouth every hour as needed for smoking cessation.    . nicotine polacrilex (NICORETTE) 4 MG gum Take 4 mg by mouth daily.    [name] DELICA LANCETS FINE MISC Check Blood sugar twice daily. Dx:E11.9 100 each 12  . pantoprazole (PROTONIX) 40 MG tablet Take 1 tablet (40 mg total) by mouth 2 (two) times daily. Reported on 07/11/2015 (Patient taking differently: Take 40 mg by mouth 2 (two) times daily. ) 180 tablet 1  . polyethylene glycol (MIRALAX / GLYCOLAX) packet Take 17 g by mouth daily as needed for mild constipation. Reported on 07/11/2015 (Patient taking differently: Take 17 g by mouth daily as needed for mild constipation. Mix in 8 oz liquid and drink) 14 each 1  . rosuvastatin (CRESTOR) 20 MG tablet TAKE 1 TABLET(20 MG) BY MOUTH DAILY 90 tablet 1  . gabapentin (NEURONTIN) 100 MG capsule Take 100 mg by mouth 3 (three) times daily as needed (pain). Reported on 07/11/2015    . oxyCODONE-acetaminophen (PERCOCET) 7.5-325 MG tablet Take 1-2 tablets by mouth every 6 (six) hours as needed for severe pain. From kidney stone (Patient not taking: Reported on 04/25/2016) 30 tablet 0  . traZODone (DESYREL) 50 MG tablet Take 1 tablet (50 mg total) by mouth at bedtime as needed for sleep. (Patient not taking: Reported on 04/25/2016) 30 tablet 3  . naproxen (NAPROSYN) 500 MG tablet Take 1 tablet (500 mg total) by mouth 2 (two) times daily with a meal. (Patient not taking: Reported on 04/25/2016) 30 tablet 0  . promethazine (PHENERGAN) 25 MG tablet Take 1 tablet (25 mg total) by mouth every 6 (six) hours as needed for nausea or vomiting. (Patient not taking: Reported on 04/25/2016)  12 tablet 0   Facility-Administered Medications Prior to Visit  Medication Dose Route Frequency Provider Last Rate Last Dose  . folic acid (FOLVITE) tablet 1 mg  1 mg Oral Daily Shuvon B Rankin, NP      . haloperidol (HALDOL) tablet 5 mg  5 mg Oral Q6H PRN Shuvon B Rankin, NP      . LORazepam (ATIVAN) tablet 1 mg  1 mg Oral Q8H PRN Shuvon B Rankin, NP      . magnesium hydroxide (MILK OF MAGNESIA) suspension 30 mL  30 mL Oral Daily PRN Shuvon B Rankin,  NP      . multivitamin with minerals tablet 1 tablet  1 tablet Oral Daily Shuvon B Rankin, NP       No orders of the defined types were placed in this encounter.        Objective:   Physical Exam  Vitals:  Vitals:   04/25/16 0852  BP: 114/80  Pulse: (!) 101  SpO2: 96%  Weight: 205 lb 6.4 oz (93.2 kg)  Height: '5\' 9"'$  (1.753 m)    Constitutional/General:  Pleasant, well-nourished, well-developed, not in any distress,  Comfortably seating.  Well kempt  Body mass index is 30.33 kg/m. Wt Readings from Last 3 Encounters:  04/25/16 205 lb 6.4 oz (93.2 kg)  03/10/16 208 lb (94.3 kg)  02/13/16 209 lb (94.8 kg)     HEENT: Pupils equal and reactive to light and accommodation. Anicteric sclerae. Normal nasal mucosa.   No oral  lesions,  mouth clear,  oropharynx clear, no postnasal drip. (-) Oral thrush. No dental caries.  Airway - Mallampati class III  Neck: No masses. Midline trachea. No JVD, (-) LAD. (-) bruits appreciated. (+) trache scar.   Respiratory/Chest: Grossly normal chest. (-) deformity. (-) Accessory muscle use.  Symmetric expansion. (-) Tenderness on palpation.  Resonant on percussion.  Diminished BS on both lower lung zones. (-) wheezing,crackles (+) rhonchi occasional which clear with coughing (-) egophony  Cardiovascular: Regular rate and  rhythm, heart sounds normal, no murmur or gallops, no peripheral edema  Gastrointestinal:  Normal bowel sounds. Soft, non-tender. No hepatosplenomegaly.  (-) masses.    Musculoskeletal:  Normal muscle tone. Normal gait.   Extremities: Grossly normal. (-) clubbing, cyanosis.  (-) edema  Skin: (-) rash,lesions seen.   Neurological/Psychiatric : alert, oriented to time, place, person. Normal mood and affect          Assessment & Plan:  OSA (obstructive sleep apnea) Patient has snoring, gasping, witnessed apneas, choking.  He is diabled 2/2 muscular issues/weakness. He sleeps "3 hrs" in a day. He has frequent awakenings. 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 Patient had a diagnostic sleep study done on 07/04/2015 which showed AHI of 35.  He denies depression or anxiety.   His ex-wife has OSA and uses cpap.  He thinks he can try cpap.   He has been admitted several times and cpap has been tried and he tolerated it.   Plan :  We extensively discussed the diagnosis, pathophysiology, and treatment options for Obstructive Sleep Apnea (OSA).  We discussed treatment options for OSA including CPAP, BiPaP, as well as surgical options and oral devices.   We will start patient on autocpap.  If not good correction, he will need a bipap titration study. Pt states he sleeps "3 hrs" per night. Used cpap before when he was admitted.  Ex wife has a cpap.  Patient was instructed to call the office if he/she has not received the PAP device in 1-2 weeks.  Patient was instructed to have mask, tubings, filter, reservoir cleaned at least once a week with soapy water.  Patient was instructed to call the office if he/she is having issues with the PAP device.    I advised patient to obtain sufficient amount of sleep --  7 to 8 hours at least in a 24 hr period.  Patient was advised to follow good sleep hygiene.  Patient was advised NOT to engage in activities requiring concentration and/or vigilance if he/she is and  sleepy.  Patient is NOT to drive  if he/she is sleepy.      Tobacco dependency Smoking cessation.      Thank you very much for  letting me participate in this patient's care. Please do not hesitate to give me a call if you have any questions or concerns regarding the treatment plan.   Patient will follow up with me in  6-8 weeks.     Monica Becton, MD 04/25/2016   9:17 AM [facility] [place] Pager: [phone] Office: [phone], Fax: [phone]

## 2016-04-25 NOTE — Assessment & Plan Note (Signed)
Patient has snoring, gasping, witnessed apneas, choking.  He is diabled 2/2 muscular issues/weakness. He sleeps "3 hrs" in a day. He has frequent awakenings. 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 Patient had a diagnostic sleep study done on 07/04/2015 which showed AHI of 35.  He denies depression or anxiety.   His ex-wife has OSA and uses cpap.  He thinks he can try cpap.   He has been admitted several times and cpap has been tried and he tolerated it.   Plan :  We extensively discussed the diagnosis, pathophysiology, and treatment options for Obstructive Sleep Apnea (OSA).  We discussed treatment options for OSA including CPAP, BiPaP, as well as surgical options and oral devices.   We will start patient on autocpap.  If not good correction, he will need a bipap titration study. Pt states he sleeps "3 hrs" per night. Used cpap before when he was admitted.  Ex wife has a cpap.  Patient was instructed to call the office if he/she has not received the PAP device in 1-2 weeks.  Patient was instructed to have mask, tubings, filter, reservoir cleaned at least once a week with soapy water.  Patient was instructed to call the office if he/she is having issues with the PAP device.    I advised patient to obtain sufficient amount of sleep --  7 to 8 hours at least in a 24 hr period.  Patient was advised to follow good sleep hygiene.  Patient was advised NOT to engage in activities requiring concentration and/or vigilance if he/she is and  sleepy.  Patient is NOT to drive if he/she is sleepy.

## 2016-04-25 NOTE — Assessment & Plan Note (Signed)
Smoking cessation  

## 2016-05-03 DIAGNOSIS — F17200 Nicotine dependence, unspecified, uncomplicated: Secondary | ICD-10-CM | POA: Diagnosis not present

## 2016-05-03 DIAGNOSIS — G4733 Obstructive sleep apnea (adult) (pediatric): Secondary | ICD-10-CM | POA: Diagnosis not present

## 2016-06-03 DIAGNOSIS — F17200 Nicotine dependence, unspecified, uncomplicated: Secondary | ICD-10-CM | POA: Diagnosis not present

## 2016-06-03 DIAGNOSIS — G4733 Obstructive sleep apnea (adult) (pediatric): Secondary | ICD-10-CM | POA: Diagnosis not present

## 2016-06-06 ENCOUNTER — Ambulatory Visit: Payer: Self-pay | Admitting: Adult Health

## 2016-06-10 ENCOUNTER — Other Ambulatory Visit: Payer: Self-pay | Admitting: Family Medicine

## 2016-06-10 DIAGNOSIS — E785 Hyperlipidemia, unspecified: Secondary | ICD-10-CM

## 2016-07-04 DIAGNOSIS — F17200 Nicotine dependence, unspecified, uncomplicated: Secondary | ICD-10-CM | POA: Diagnosis not present

## 2016-07-04 DIAGNOSIS — G4733 Obstructive sleep apnea (adult) (pediatric): Secondary | ICD-10-CM | POA: Diagnosis not present

## 2016-08-01 DIAGNOSIS — F17200 Nicotine dependence, unspecified, uncomplicated: Secondary | ICD-10-CM | POA: Diagnosis not present

## 2016-08-01 DIAGNOSIS — G4733 Obstructive sleep apnea (adult) (pediatric): Secondary | ICD-10-CM | POA: Diagnosis not present

## 2016-08-20 NOTE — Progress Notes (Signed)
Pre visit review using our clinic review tool, if applicable. No additional management support is needed unless otherwise documented below in the visit note. 

## 2016-08-20 NOTE — Progress Notes (Signed)
   Subjective:   Lucas Small is a 51 y.o. male who presents for an Initial Medicare Annual Wellness Visit.     Patient presents to clinic today complaining of 3-4 days of SOB and soreness/tightness in his throat-"I just don't feel right". He is requesting breathing treatment. He states he has several broken teeth that he needs removed and wonders if he might have an abscess. Dental resources list provided. He is also experiencing some intermittent CP/tightness, but states this is unchanged from his baseline. He is in no acute distress, regular breathing pattern, able to speak in complete sentences without difficulty. AWV canceled due to acute symptoms and pt placed on provider schedule for evaluation/treatment.  Dorrene German, RN   08/20/2016

## 2016-08-21 ENCOUNTER — Ambulatory Visit (INDEPENDENT_AMBULATORY_CARE_PROVIDER_SITE_OTHER): Payer: PPO | Admitting: Medical

## 2016-08-21 ENCOUNTER — Telehealth: Payer: Self-pay | Admitting: Medical

## 2016-08-21 ENCOUNTER — Encounter: Payer: Self-pay | Admitting: Medical

## 2016-08-21 ENCOUNTER — Ambulatory Visit (HOSPITAL_BASED_OUTPATIENT_CLINIC_OR_DEPARTMENT_OTHER): Payer: PPO

## 2016-08-21 VITALS — BP 130/90 | HR 98 | Temp 97.9°F | Resp 18 | Ht 69.0 in | Wt 217.8 lb

## 2016-08-21 DIAGNOSIS — R6884 Jaw pain: Secondary | ICD-10-CM

## 2016-08-21 DIAGNOSIS — R0602 Shortness of breath: Secondary | ICD-10-CM | POA: Diagnosis not present

## 2016-08-21 DIAGNOSIS — K047 Periapical abscess without sinus: Secondary | ICD-10-CM | POA: Diagnosis not present

## 2016-08-21 DIAGNOSIS — K0889 Other specified disorders of teeth and supporting structures: Secondary | ICD-10-CM

## 2016-08-21 DIAGNOSIS — R079 Chest pain, unspecified: Secondary | ICD-10-CM | POA: Diagnosis not present

## 2016-08-21 DIAGNOSIS — M542 Cervicalgia: Secondary | ICD-10-CM

## 2016-08-21 LAB — CBC WITH DIFFERENTIAL/PLATELET
Basophils Absolute: 0.1 10*3/uL (ref 0.0–0.1)
Basophils Relative: 1 % (ref 0.0–3.0)
Eosinophils Absolute: 0.3 10*3/uL (ref 0.0–0.7)
Eosinophils Relative: 3.7 % (ref 0.0–5.0)
HCT: 49.2 % (ref 39.0–52.0)
Hemoglobin: 16.9 g/dL (ref 13.0–17.0)
Lymphocytes Relative: 22.3 % (ref 12.0–46.0)
Lymphs Abs: 2 10*3/uL (ref 0.7–4.0)
MCHC: 34.3 g/dL (ref 30.0–36.0)
MCV: 93.3 fl (ref 78.0–100.0)
Monocytes Absolute: 0.7 10*3/uL (ref 0.1–1.0)
Monocytes Relative: 7.9 % (ref 3.0–12.0)
Neutro Abs: 5.9 10*3/uL (ref 1.4–7.7)
Neutrophils Relative %: 65.1 % (ref 43.0–77.0)
Platelets: 249 10*3/uL (ref 150.0–400.0)
RBC: 5.27 Mil/uL (ref 4.22–5.81)
RDW: 13.7 % (ref 11.5–15.5)
WBC: 9.1 10*3/uL (ref 4.0–10.5)

## 2016-08-21 LAB — COMPREHENSIVE METABOLIC PANEL WITH GFR
ALT: 34 U/L (ref 0–53)
AST: 24 U/L (ref 0–37)
Albumin: 4.5 g/dL (ref 3.5–5.2)
Alkaline Phosphatase: 96 U/L (ref 39–117)
BUN: 20 mg/dL (ref 6–23)
CO2: 27 meq/L (ref 19–32)
Calcium: 9.7 mg/dL (ref 8.4–10.5)
Chloride: 105 meq/L (ref 96–112)
Creatinine, Ser: 1.03 mg/dL (ref 0.40–1.50)
GFR: 81.33 mL/min
Glucose, Bld: 112 mg/dL — ABNORMAL HIGH (ref 70–99)
Potassium: 4.2 meq/L (ref 3.5–5.1)
Sodium: 138 meq/L (ref 135–145)
Total Bilirubin: 0.6 mg/dL (ref 0.2–1.2)
Total Protein: 7.7 g/dL (ref 6.0–8.3)

## 2016-08-21 LAB — TROPONIN I: TNIDX: 0 ug/L (ref 0.00–0.06)

## 2016-08-21 MED ORDER — CEFTRIAXONE SODIUM 1 G IJ SOLR
1.0000 g | Freq: Once | INTRAMUSCULAR | Status: AC
  Administered 2016-08-21: 1 g via INTRAMUSCULAR

## 2016-08-21 MED ORDER — CLINDAMYCIN HCL 150 MG PO CAPS: 150.0000 mg | ORAL_CAPSULE | Freq: Three times a day (TID) | ORAL | 0 refills | Status: DC

## 2016-08-21 NOTE — Progress Notes (Signed)
Subjective:    Patient ID: Lucas Small, male    DOB: Sep 03, 1965, 51 y.o.   MRN: [id number]  HPI  Pt in for some recent left jaw pain. Pain around upper and lower teeth for 4 days. He feels some swelling under left side of jaw. No fever, no chills but some sweats occasionally. Pt feels like throat/upper airwayt maybe obstructed at times.. Based on these complaints I advised ED evaluation. He states not that bad and does not want to go to ED.   Pt repeated he did not want to go to ED adamant.  Pt also states some faint lower chest discomfort upper abdomen pain 2 days ago but no reoccurence. Lasted few seconds then subsided.  Pt o2 sat good presently.    Review of Systems  Constitutional: Negative for chills, fatigue and fever.  HENT: Positive for facial swelling. Negative for congestion, nosebleeds, postnasal drip, rhinorrhea, sinus pain and sinus pressure.        Inside of mouth/gums swollen. Teeth area tender.  Respiratory: Positive for shortness of breath. Negative for cough, chest tightness and wheezing.        Some mid sob. But then he stated he needed to use cpap yesterday day during the day and turn head.  Cardiovascular: Negative for chest pain and palpitations.       None now but 2 days ago. Some chest pain.  Gastrointestinal: Positive for abdominal pain.       Possible 2 days ago vs lower chest pain.  Musculoskeletal: Negative for back pain.  Skin: Negative for rash.  Neurological: Negative for dizziness and headaches.  Hematological: Negative for adenopathy.  Psychiatric/Behavioral: Negative for agitation, confusion, dysphoric mood and hallucinations. The patient is not nervous/anxious.     Past Medical History:  Diagnosis Date  . Alcoholic ([place])   . CHF (congestive heart failure) ([place])   . Depression   . Diabetes mellitus without complication (H[facility])    metformin  . Dyspnea    with exertion .  Marland Kitchen Dysrhythmia    while hospitalized in [place]  . History of  kidney stones   . Hypertension   . MRSA (methicillin resistant Staphylococcus aureus)   . Multiple myeloma ([place]) 1990's  . Perforation bowel ([place])   . PNA (pneumonia)   . Renal disorder   . Renal failure   . Renal insufficiency   . Respiratory failure ([place])   . Sickle cell anemia ([facility])   . Sleep apnea    no CPAP machine     Social History   Social History  . Marital status: Divorced    Spouse name: N/A  . Number of children: N/A  . Years of education: N/A   Occupational History  .      disabled   Social History Main Topics  . Smoking status: Current Some Day Smoker    Packs/day: 1.00    Years: 28.00    Types: Cigarettes  . Smokeless tobacco: Never Used  . Alcohol use Yes     Comment: occasionally  . Drug use: No     Comment: THC cocaine  . Sexual activity: Yes     Comment: Not asked   Other Topics Concern  . Not on file   Social History Narrative   Exercise---  Walking some --  Not enough    Past Surgical History:  Procedure Laterality Date  . ABDOMINAL SURGERY    . CARDIAC SURGERY    . HERNIA REPAIR    .  ILEOSTOMY    . PERIPHERALLY INSERTED CENTRAL CATHETER INSERTION      No family history on file.  Allergies  Allergen Reactions  . Bee Venom Anaphylaxis    Current Outpatient Prescriptions on File Prior to Visit  Medication Sig Dispense Refill  . aspirin EC 81 MG tablet Take 81 mg by mouth daily as needed.     Marland Kitchen glucose blood (ONETOUCH VERIO) test strip Check Blood sugar twice daily. Dx:E11.9 100 each 12  . metFORMIN (GLUCOPHAGE) 500 MG tablet Take 1 tablet (500 mg total) by mouth 2 (two) times daily with a meal. 180 tablet 1  . Multiple Vitamin (MULTIVITAMIN WITH MINERALS) TABS tablet Take 1 tablet by mouth daily.    [name] DELICA LANCETS FINE MISC Check Blood sugar twice daily. Dx:E11.9 100 each 12  . pantoprazole (PROTONIX) 40 MG tablet Take 1 tablet (40 mg total) by mouth 2 (two) times daily. Reported on 07/11/2015 (Patient taking  differently: Take 40 mg by mouth 2 (two) times daily. ) 180 tablet 1  . polyethylene glycol (MIRALAX / GLYCOLAX) packet Take 17 g by mouth daily as needed for mild constipation. Reported on 07/11/2015 14 each 1  . rosuvastatin (CRESTOR) 20 MG tablet TAKE 1 TABLET(20 MG) BY MOUTH DAILY 90 tablet 0  . traZODone (DESYREL) 50 MG tablet Take 1 tablet (50 mg total) by mouth at bedtime as needed for sleep. 30 tablet 3  . gabapentin (NEURONTIN) 100 MG capsule Take 100 mg by mouth 3 (three) times daily as needed (pain). Reported on 07/11/2015     Current Facility-Administered Medications on File Prior to Visit  Medication Dose Route Frequency Provider Last Rate Last Dose  . folic acid (FOLVITE) tablet 1 mg  1 mg Oral Daily Shuvon B Rankin, NP      . haloperidol (HALDOL) tablet 5 mg  5 mg Oral Q6H PRN Shuvon B Rankin, NP      . LORazepam (ATIVAN) tablet 1 mg  1 mg Oral Q8H PRN Shuvon B Rankin, NP      . magnesium hydroxide (MILK OF MAGNESIA) suspension 30 mL  30 mL Oral Daily PRN Shuvon B Rankin, NP      . multivitamin with minerals tablet 1 tablet  1 tablet Oral Daily Shuvon B Rankin, NP        BP 130/90 (BP Location: Right Arm, Patient Position: Sitting, Cuff Size: Normal)   Pulse 98   Temp 97.9 F (36.6 C) (Oral)   Resp 18   Ht '5\' 9"'$  (1.753 m)   Wt 217 lb 12.8 oz (98.8 kg)   SpO2 98%   BMI 32.16 kg/m       Objective:   Physical Exam   General Mental Status- Alert. General Appearance- Not in acute distress.   Skin General: Color- Normal Color. Moisture- Normal Moisture.  Neck Carotid Arteries- Normal color. Moisture- Normal Moisture. No carotid bruits. No JVD. Faint swollen in left submandibular area.   Mouth- left side upper and lower teeth obivous caries with swollen soft tissue that is tender to palpation . Tender at base of teeth. The palate does not look swollen. Nor does posterior pharynx.  Chest and Lung Exam Auscultation: Breath  Sounds:-Normal.  Cardiovascular Auscultation:Rythm- Regular. Murmurs & Other Heart Sounds:Auscultation of the heart reveals- No Murmurs.  Abdomen Inspection:-Inspeection Normal. Palpation/Percussion:Note:No mass. Palpation and Percussion of the abdomen reveal- Non Tender, Non Distended + BS, no rebound or guarding.    Neurologic Cranial Nerve exam:- CN III-XII intact(No nystagmus), symmetric  smile. Drift Test:- No drift. Romberg Exam:- Negative.  Heal to Toe Gait exam:-Normal. Finger to Nose:- Normal/Intact Strength:- 5/5 equal and symmetric strength both upper and lower extremities.     Assessment & Plan:  For your recent tooth infection, slight swelling of neck and reported feeling of trouble breathing upper airway I recommended ED evaluation. You declined various times. So will treat out patient but you area aware if you worsen call 911 or go to ED.  We gave you rocephin 1 gram in office. Start clindamycin tomorrow. Will get staff to try to refer you to dentist.  Will get cbc today, cmp, and include one troponin since you had chest pain 2 days ago. Also ordering imaging of neck to assess your upper airway.  Follow up Thursday before the long holiday week.   Naziyah Tieszen, Percell Miller, PA-C

## 2016-08-21 NOTE — Progress Notes (Signed)
   Subjective:    Patient ID: Lucas Small, male    DOB: 1965-08-15, 51 y.o.   MRN: [id number]  HPI Already documented. dupicate created. No charge.   Review of Systems     Objective:   Physical Exam        Assessment & Plan:

## 2016-08-21 NOTE — Progress Notes (Signed)
Pre visit review using our clinic tool,if applicable. No additional management support is needed unless otherwise documented below in the visit note.  

## 2016-08-21 NOTE — Patient Instructions (Addendum)
For your recent tooth infection, slight swelling of neck and reported feeling of trouble breathing upper airway I recommended ED evaluation. You declined various times. So will treat out patient but you area aware if you worsen call 911 or go to ED.  We gave you rocephin 1 gram in office. Start clindamycin tomorrow. Will get staff to try to refer you to dentist.  Will get cbc today, cmp, and include one troponin since you had chest pain 2 days ago. Also ordering imaging of neck to assess your upper airway.  Follow up Thursday before the long holiday week.

## 2016-08-21 NOTE — Telephone Encounter (Signed)
You were filling out referral forms to dentist. Would go through with that. Could add tooth infection. If I need to sign sheet let me know.

## 2016-08-22 NOTE — Telephone Encounter (Signed)
Referral request with O[facility] from 08/21/16 forwarded to [facility] 03/28

## 2016-08-28 ENCOUNTER — Encounter: Payer: Self-pay | Admitting: *Deleted

## 2016-08-28 NOTE — Progress Notes (Signed)
Letter Mailed/SLS 04/03

## 2016-09-01 DIAGNOSIS — F17200 Nicotine dependence, unspecified, uncomplicated: Secondary | ICD-10-CM | POA: Diagnosis not present

## 2016-09-01 DIAGNOSIS — G4733 Obstructive sleep apnea (adult) (pediatric): Secondary | ICD-10-CM | POA: Diagnosis not present

## 2016-09-14 ENCOUNTER — Other Ambulatory Visit: Payer: Self-pay | Admitting: Family Medicine

## 2016-09-14 DIAGNOSIS — E785 Hyperlipidemia, unspecified: Secondary | ICD-10-CM

## 2016-09-18 ENCOUNTER — Ambulatory Visit (INDEPENDENT_AMBULATORY_CARE_PROVIDER_SITE_OTHER): Payer: PPO | Admitting: Family Medicine

## 2016-09-18 ENCOUNTER — Other Ambulatory Visit (HOSPITAL_COMMUNITY)
Admission: RE | Admit: 2016-09-18 | Discharge: 2016-09-18 | Disposition: A | Discharge status: Home / Self Care | Payer: PPO | Source: Ambulatory Visit | Attending: Family Medicine | Admitting: Family Medicine

## 2016-09-18 ENCOUNTER — Encounter: Payer: Self-pay | Admitting: Family Medicine

## 2016-09-18 VITALS — BP 126/80 | HR 95 | Temp 98.1°F | Resp 16 | Ht 69.0 in | Wt 213.8 lb

## 2016-09-18 DIAGNOSIS — K458 Other specified abdominal hernia without obstruction or gangrene: Secondary | ICD-10-CM

## 2016-09-18 DIAGNOSIS — L918 Other hypertrophic disorders of the skin: Secondary | ICD-10-CM

## 2016-09-18 DIAGNOSIS — G894 Chronic pain syndrome: Secondary | ICD-10-CM | POA: Diagnosis not present

## 2016-09-18 MED ORDER — NONFORMULARY OR COMPOUNDED ITEM: 1 refills | Status: DC

## 2016-09-18 NOTE — Patient Instructions (Addendum)
  Keep dry for 24h and then you can shower as usual   Skin Tag, Adult A skin tag (acrochordon) is a soft, extra growth of skin. Most skin tags are flesh-colored and rarely bigger than a pencil eraser. They commonly form near areas where there are folds in the skin, such as the armpit or groin. Skin tags are not dangerous, and they do not spread from person to person (are not contagious). You may have one skin tag or several. Skin tags do not require treatment. However, your health care provider may recommend removal of a skin tag if it:  Gets irritated from clothing.  Bleeds.  Is visible and unsightly. Your health care provider can remove skin tags with a simple surgical procedure or a procedure that involves freezing the skin tag. Follow these instructions at home:  Watch for any changes in your skin tag. A normal skin tag does not require any other special care at home.  Take over-the-counter and prescription medicines only as told by your health care provider.  Keep all follow-up visits as told by your health care provider. This is important. Contact a health care provider if:  You have a skin tag that:  Becomes painful.  Changes color.  Bleeds.  Swells.  You develop more skin tags. This information is not intended to replace advice given to you by your health care provider. Make sure you discuss any questions you have with your health care provider. Document Released: 05/29/2015 Document Revised: 01/08/2016 Document Reviewed: 05/29/2015 Elsevier Interactive Patient Education  2017 [employer].

## 2016-09-18 NOTE — Progress Notes (Signed)
Patient ID: Lucas Small, male   DOB: [birth year], 51 y.o.   MRN: [id number]     Subjective:  I acted as a Neurosurgeon for Dr. Zola Button.  [name], CMA   Patient ID: Lucas Small, male    DOB: 12-06-1965, 51 y.o.   MRN: [id number]  Chief Complaint  Patient presents with  . skin tag  . pain management referral    HPI  Patient is in today for skin tag on buttocks.  He would like it removed.  He states that it is very irritated, especially when he moves.  He would also like a referral to pain management.  Patient Care Team: Donato Schultz, DO as PCP - General (Family Medicine)   Past Medical History:  Diagnosis Date  . Alcoholic ([facility])   . CHF (congestive heart failure) (H[facility])   . Depression   . Diabetes mellitus without complication (HCC)    metformin  . Dyspnea    with exertion .  Marland Kitchen Dysrhythmia    while hospitalized in [place]  . History of kidney stones   . Hypertension   . MRSA (methicillin resistant Staphylococcus aureus)   . Multiple myeloma ([facility]) 1990's  . Perforation bowel (H[facility])   . PNA (pneumonia)   . Renal disorder   . Renal failure   . Renal insufficiency   . Respiratory failure (H[facility])   . Sickle cell anemia (H[facility])   . Sleep apnea    no CPAP machine    Past Surgical History:  Procedure Laterality Date  . ABDOMINAL SURGERY    . CARDIAC SURGERY    . HERNIA REPAIR    . ILEOSTOMY    . PERIPHERALLY INSERTED CENTRAL CATHETER INSERTION      No family history on file.  Social History   Social History  . Marital status: Divorced    Spouse name: N/A  . Number of children: N/A  . Years of education: N/A   Occupational History  .      disabled   Social History Main Topics  . Smoking status: Former Smoker    Packs/day: 1.00    Years: 28.00    Types: Cigarettes    Quit date: 08/28/2016  . Smokeless tobacco: Never Used  . Alcohol use Yes     Comment: occasionally  . Drug use: No     Comment: THC cocaine  . Sexual activity: Yes     Comment:  Not asked   Other Topics Concern  . Not on file   Social History Narrative   Exercise---  Walking some --  Not enough    Outpatient Medications Prior to Visit  Medication Sig Dispense Refill  . aspirin EC 81 MG tablet Take 81 mg by mouth daily as needed.     Marland Kitchen glucose blood (ONETOUCH VERIO) test strip Check Blood sugar twice daily. Dx:E11.9 100 each 12  . ibuprofen (ADVIL,MOTRIN) 200 MG tablet Take 200 mg by mouth as needed.    . metFORMIN (GLUCOPHAGE) 500 MG tablet Take 1 tablet (500 mg total) by mouth 2 (two) times daily with a meal. 180 tablet 1  . Multiple Vitamin (MULTIVITAMIN WITH MINERALS) TABS tablet Take 1 tablet by mouth daily.    Letta Pate DELICA LANCETS FINE MISC Check Blood sugar twice daily. Dx:E11.9 100 each 12  . pantoprazole (PROTONIX) 40 MG tablet Take 1 tablet (40 mg total) by mouth 2 (two) times daily. Reported on 07/11/2015 (Patient taking differently: Take 40 mg by mouth 2 (two)  times daily. ) 180 tablet 1  . polyethylene glycol (MIRALAX / GLYCOLAX) packet Take 17 g by mouth daily as needed for mild constipation. Reported on 07/11/2015 14 each 1  . rosuvastatin (CRESTOR) 20 MG tablet TAKE 1 TABLET(20 MG) BY MOUTH DAILY 90 tablet 0  . traZODone (DESYREL) 50 MG tablet Take 1 tablet (50 mg total) by mouth at bedtime as needed for sleep. 30 tablet 3  . clindamycin (CLEOCIN) 150 MG capsule Take 1 capsule (150 mg total) by mouth 3 (three) times daily. 30 capsule 0  . gabapentin (NEURONTIN) 100 MG capsule Take 100 mg by mouth 3 (three) times daily as needed (pain). Reported on 07/11/2015     Facility-Administered Medications Prior to Visit  Medication Dose Route Frequency Provider Last Rate Last Dose  . folic acid (FOLVITE) tablet 1 mg  1 mg Oral Daily Shuvon B Rankin, NP      . haloperidol (HALDOL) tablet 5 mg  5 mg Oral Q6H PRN Shuvon B Rankin, NP      . LORazepam (ATIVAN) tablet 1 mg  1 mg Oral Q8H PRN Shuvon B Rankin, NP      . magnesium hydroxide (MILK OF MAGNESIA)  suspension 30 mL  30 mL Oral Daily PRN Shuvon B Rankin, NP      . multivitamin with minerals tablet 1 tablet  1 tablet Oral Daily Shuvon B Rankin, NP        Allergies  Allergen Reactions  . Bee Venom Anaphylaxis    Review of Systems  Constitutional: Negative for chills, fever and malaise/fatigue.  HENT: Negative for congestion and hearing loss.   Eyes: Negative for discharge.  Respiratory: Negative for cough, sputum production and shortness of breath.   Cardiovascular: Negative for chest pain, palpitations and leg swelling.  Gastrointestinal: Negative for abdominal pain, blood in stool, constipation, diarrhea, heartburn, nausea and vomiting.  Genitourinary: Negative for dysuria, frequency, hematuria and urgency.  Musculoskeletal: Negative for back pain, falls and myalgias.  Skin: Negative for rash.  Neurological: Negative for dizziness, sensory change, loss of consciousness, weakness and headaches.  Endo/Heme/Allergies: Negative for environmental allergies. Does not bruise/bleed easily.  Psychiatric/Behavioral: Negative for depression and suicidal ideas. The patient is not nervous/anxious and does not have insomnia.        Objective:    Physical Exam  Constitutional: He is oriented to person, place, and time. Vital signs are normal. He appears well-developed and well-nourished. He is sleeping.  HENT:  Head: Normocephalic and atraumatic.  Mouth/Throat: Oropharynx is clear and moist.  Eyes: EOM are normal. Pupils are equal, round, and reactive to light.  Neck: Normal range of motion. Neck supple. No thyromegaly present.  Cardiovascular: Normal rate and regular rhythm.   No murmur heard. Pulmonary/Chest: Effort normal and breath sounds normal. No respiratory distress. He has no wheezes. He has no rales. He exhibits no tenderness.  Musculoskeletal: He exhibits no edema or tenderness.  Neurological: He is alert and oriented to person, place, and time.  Skin: Skin is warm and dry.    + skin tag R buttock--- consent signed Anesthetized with xylocaine 1 % 1 cc Cleaned with betadine Scalpel used to cut off skin tag Silver nitrate used to stop bleeding Bandage put in place   Psychiatric: He has a normal mood and affect. His behavior is normal. Judgment and thought content normal.  Nursing note and vitals reviewed.   BP 126/80 (BP Location: Left Arm, Cuff Size: Normal)   Pulse 95   Temp 98.1  F (36.7 C) (Oral)   Resp 16   Ht 5\' 9"  (1.753 m)   Wt 213 lb 12.8 oz (97 kg)   SpO2 97%   BMI 31.57 kg/m  Wt Readings from Last 3 Encounters:  09/18/16 213 lb 12.8 oz (97 kg)  08/21/16 217 lb 12.8 oz (98.8 kg)  04/25/16 205 lb 6.4 oz (93.2 kg)   BP Readings from Last 3 Encounters:  09/18/16 126/80  08/21/16 130/90  04/25/16 114/80     Immunization History  Administered Date(s) Administered  . PPD Test 08/28/2011  . Pneumococcal Polysaccharide-23 02/13/2016    Health Maintenance  Topic Date Due  . TETANUS/TDAP  11/16/1985  . HEMOGLOBIN A1C  08/12/2016  . INFLUENZA VACCINE  12/26/2016  . FOOT EXAM  02/12/2017  . URINE MICROALBUMIN  02/12/2017  . OPHTHALMOLOGY EXAM  02/19/2017  . PNEUMOCOCCAL POLYSACCHARIDE VACCINE (2) 02/12/2021  . HIV Screening  Completed    Lab Results  Component Value Date   WBC 9.1 08/21/2016   HGB 16.9 08/21/2016   HCT 49.2 08/21/2016   PLT 249.0 08/21/2016   GLUCOSE 112 (H) 08/21/2016   CHOL 158 02/13/2016   TRIG 143.0 02/13/2016   HDL 35.20 (L) 02/13/2016   LDLCALC 94 02/13/2016   ALT 34 08/21/2016   AST 24 08/21/2016   NA 138 08/21/2016   K 4.2 08/21/2016   CL 105 08/21/2016   CREATININE 1.03 08/21/2016   BUN 20 08/21/2016   CO2 27 08/21/2016   TSH 1.59 02/13/2016   INR 1.11 10/14/2010   HGBA1C 6.9 (H) 02/13/2016   MICROALBUR 2.5 (H) 02/13/2016    Lab Results  Component Value Date   TSH 1.59 02/13/2016   Lab Results  Component Value Date   WBC 9.1 08/21/2016   HGB 16.9 08/21/2016   HCT 49.2 08/21/2016   MCV  93.3 08/21/2016   PLT 249.0 08/21/2016   Lab Results  Component Value Date   NA 138 08/21/2016   K 4.2 08/21/2016   CO2 27 08/21/2016   GLUCOSE 112 (H) 08/21/2016   BUN 20 08/21/2016   CREATININE 1.03 08/21/2016   BILITOT 0.6 08/21/2016   ALKPHOS 96 08/21/2016   AST 24 08/21/2016   ALT 34 08/21/2016   PROT 7.7 08/21/2016   ALBUMIN 4.5 08/21/2016   CALCIUM 9.7 08/21/2016   ANIONGAP 12 03/10/2016   GFR 81.33 08/21/2016   Lab Results  Component Value Date   CHOL 158 02/13/2016   Lab Results  Component Value Date   HDL 35.20 (L) 02/13/2016   Lab Results  Component Value Date   LDLCALC 94 02/13/2016   Lab Results  Component Value Date   TRIG 143.0 02/13/2016   Lab Results  Component Value Date   CHOLHDL 4 02/13/2016   Lab Results  Component Value Date   HGBA1C 6.9 (H) 02/13/2016         Assessment & Plan:   Problem List Items Addressed This Visit    None    Visit Diagnoses    Chronic pain syndrome    -  Primary   Relevant Orders   Ambulatory referral to Pain Clinic   Other specified abdominal hernia without obstruction or gangrene       Relevant Medications   NONFORMULARY OR COMPOUNDED ITEM   Skin tag       Relevant Orders   Dermatology pathology      I have discontinued Mr. Winker's gabapentin and clindamycin. I am also having him start on NONFORMULARY  OR COMPOUNDED ITEM. Additionally, I am having him maintain his traZODone, polyethylene glycol, glucose blood, ONETOUCH DELICA LANCETS FINE, pantoprazole, metFORMIN, aspirin EC, multivitamin with minerals, ibuprofen, and rosuvastatin.  Meds ordered this encounter  Medications  . NONFORMULARY OR COMPOUNDED ITEM    Sig: Abdominal binder dx abd hernias    Dispense:  1 each    Refill:  1    CMA served as scribe during this visit. History, Physical and Plan performed by medical provider. Documentation and orders reviewed and attested to.  Ann Held, DO

## 2016-09-18 NOTE — Progress Notes (Signed)
Pre visit review using our clinic review tool, if applicable. No additional management support is needed unless otherwise documented below in the visit note. 

## 2016-10-01 ENCOUNTER — Ambulatory Visit (INDEPENDENT_AMBULATORY_CARE_PROVIDER_SITE_OTHER): Payer: PPO | Admitting: Family Medicine

## 2016-10-01 ENCOUNTER — Telehealth: Payer: Self-pay | Admitting: *Deleted

## 2016-10-01 ENCOUNTER — Encounter: Payer: Self-pay | Admitting: Family Medicine

## 2016-10-01 VITALS — BP 116/80 | HR 87 | Temp 97.7°F | Resp 16 | Ht 69.0 in | Wt 211.2 lb

## 2016-10-01 DIAGNOSIS — F419 Anxiety disorder, unspecified: Secondary | ICD-10-CM

## 2016-10-01 DIAGNOSIS — G4700 Insomnia, unspecified: Secondary | ICD-10-CM

## 2016-10-01 DIAGNOSIS — K432 Incisional hernia without obstruction or gangrene: Secondary | ICD-10-CM

## 2016-10-01 DIAGNOSIS — I10 Essential (primary) hypertension: Secondary | ICD-10-CM | POA: Diagnosis not present

## 2016-10-01 DIAGNOSIS — E785 Hyperlipidemia, unspecified: Secondary | ICD-10-CM

## 2016-10-01 DIAGNOSIS — F319 Bipolar disorder, unspecified: Secondary | ICD-10-CM | POA: Diagnosis not present

## 2016-10-01 DIAGNOSIS — F17200 Nicotine dependence, unspecified, uncomplicated: Secondary | ICD-10-CM | POA: Diagnosis not present

## 2016-10-01 DIAGNOSIS — I509 Heart failure, unspecified: Secondary | ICD-10-CM

## 2016-10-01 DIAGNOSIS — G4733 Obstructive sleep apnea (adult) (pediatric): Secondary | ICD-10-CM | POA: Diagnosis not present

## 2016-10-01 DIAGNOSIS — E118 Type 2 diabetes mellitus with unspecified complications: Secondary | ICD-10-CM | POA: Diagnosis not present

## 2016-10-01 DIAGNOSIS — F339 Major depressive disorder, recurrent, unspecified: Secondary | ICD-10-CM

## 2016-10-01 MED ORDER — TRAZODONE HCL 50 MG PO TABS: 50.0000 mg | ORAL_TABLET | Freq: Every evening | ORAL | 3 refills | Status: DC | PRN

## 2016-10-01 MED ORDER — ESCITALOPRAM OXALATE 20 MG PO TABS: 20.0000 mg | ORAL_TABLET | Freq: Every day | ORAL | 1 refills | Status: DC

## 2016-10-01 MED ORDER — OXYCODONE-ACETAMINOPHEN 5-325 MG PO TABS: 1.0000 | ORAL_TABLET | Freq: Three times a day (TID) | ORAL | 0 refills | Status: DC | PRN

## 2016-10-01 NOTE — Telephone Encounter (Signed)
Patient left without getting labs done.  Patient notified and will come in tomorrow for lab work.    Results given for skin tag pathology.  It was benign.

## 2016-10-01 NOTE — Addendum Note (Signed)
Addended by: Kem Boroughs D on: 10/01/2016 03:38 PM   Modules accepted: Orders

## 2016-10-01 NOTE — Progress Notes (Signed)
Patient ID: Lucas Small, male   DOB: 03-Apr-1966, 51 y.o.   MRN: [id number]     Subjective:    Patient ID: Lucas Small, male    DOB: 1966/04/12, 51 y.o.   MRN: [id number]  Chief Complaint  Patient presents with  . panic attacks    pain levels are high, been stressed, and insomia for about 2 days.    HPI Patient is in today for panic attacks.  He has been under a lot of stress lately because they are going to cancel disability.  He has not slept in 2 days.   He has had an increase in pain levels.  His pain clinic referral is in clinical review.  It will take about 4-6 weeks.  Pt has not seen the surgeon in over a year.   He is no longer seeing psych either.  Transportation is difficult.  He is having more panic attacks.   Pt will need a new psych in [place].    Past Medical History:  Diagnosis Date  . Alcoholic ([place])   . CHF (congestive heart failure) ([place])   . Depression   . Diabetes mellitus without complication (HCC)    metformin  . Dyspnea    with exertion .  Marland Kitchen Dysrhythmia    while hospitalized in [place]  . History of kidney stones   . Hypertension   . MRSA (methicillin resistant Staphylococcus aureus)   . Multiple myeloma ([place]) 1990's  . Perforation bowel ([place])   . PNA (pneumonia)   . Renal disorder   . Renal failure   . Renal insufficiency   . Respiratory failure ([place])   . Sickle cell anemia (HCC)   . Sleep apnea    no CPAP machine    Past Surgical History:  Procedure Laterality Date  . ABDOMINAL SURGERY    . CARDIAC SURGERY    . HERNIA REPAIR    . ILEOSTOMY    . PERIPHERALLY INSERTED CENTRAL CATHETER INSERTION      No family history on file.  Social History   Social History  . Marital status: Divorced    Spouse name: N/A  . Number of children: N/A  . Years of education: N/A   Occupational History  .      disabled   Social History Main Topics  . Smoking status: Former Smoker    Packs/day: 1.00    Years: 28.00    Types: Cigarettes    Quit  date: 08/28/2016  . Smokeless tobacco: Never Used  . Alcohol use Yes     Comment: occasionally  . Drug use: No     Comment: THC cocaine  . Sexual activity: Yes     Comment: Not asked   Other Topics Concern  . Not on file   Social History Narrative   Exercise---  Walking some --  Not enough    Outpatient Medications Prior to Visit  Medication Sig Dispense Refill  . aspirin EC 81 MG tablet Take 81 mg by mouth daily as needed.     Marland Kitchen glucose blood (ONETOUCH VERIO) test strip Check Blood sugar twice daily. Dx:E11.9 100 each 12  . ibuprofen (ADVIL,MOTRIN) 200 MG tablet Take 200 mg by mouth as needed.    . metFORMIN (GLUCOPHAGE) 500 MG tablet Take 1 tablet (500 mg total) by mouth 2 (two) times daily with a meal. 180 tablet 1  . Multiple Vitamin (MULTIVITAMIN WITH MINERALS) TABS tablet Take 1 tablet by mouth daily.    Marland Kitchen  NONFORMULARY OR COMPOUNDED ITEM Abdominal binder dx abd hernias 1 each 1  . ONETOUCH DELICA LANCETS FINE MISC Check Blood sugar twice daily. Dx:E11.9 100 each 12  . pantoprazole (PROTONIX) 40 MG tablet Take 1 tablet (40 mg total) by mouth 2 (two) times daily. Reported on 07/11/2015 (Patient taking differently: Take 40 mg by mouth 2 (two) times daily. ) 180 tablet 1  . polyethylene glycol (MIRALAX / GLYCOLAX) packet Take 17 g by mouth daily as needed for mild constipation. Reported on 07/11/2015 14 each 1  . rosuvastatin (CRESTOR) 20 MG tablet TAKE 1 TABLET(20 MG) BY MOUTH DAILY 90 tablet 0  . traZODone (DESYREL) 50 MG tablet Take 1 tablet (50 mg total) by mouth at bedtime as needed for sleep. 30 tablet 3   Facility-Administered Medications Prior to Visit  Medication Dose Route Frequency Provider Last Rate Last Dose  . folic acid (FOLVITE) tablet 1 mg  1 mg Oral Daily Rankin, Shuvon B, NP      . haloperidol (HALDOL) tablet 5 mg  5 mg Oral Q6H PRN Rankin, Shuvon B, NP      . LORazepam (ATIVAN) tablet 1 mg  1 mg Oral Q8H PRN Rankin, Shuvon B, NP      . magnesium hydroxide (MILK OF  MAGNESIA) suspension 30 mL  30 mL Oral Daily PRN Rankin, Shuvon B, NP      . multivitamin with minerals tablet 1 tablet  1 tablet Oral Daily Rankin, Shuvon B, NP        Allergies  Allergen Reactions  . Bee Venom Anaphylaxis    Review of Systems  Constitutional: Negative for fever and malaise/fatigue.  HENT: Negative for congestion.   Eyes: Negative for blurred vision.  Respiratory: Negative for cough and shortness of breath.   Cardiovascular: Negative for chest pain, palpitations and leg swelling.  Gastrointestinal: Negative for vomiting.  Musculoskeletal: Negative for back pain.  Skin: Negative for rash.  Neurological: Negative for loss of consciousness and headaches.  Psychiatric/Behavioral: Positive for depression and memory loss. The patient is nervous/anxious and has insomnia.        Objective:    Physical Exam  Constitutional: He appears well-developed and well-nourished. No distress.  HENT:  Head: Normocephalic and atraumatic.  Eyes: Conjunctivae are normal.  Neck: Normal range of motion. No thyromegaly present.  Cardiovascular: Normal rate and regular rhythm.   Pulmonary/Chest: Effort normal. He has no wheezes.  Abdominal: Soft. Bowel sounds are normal. There is tenderness.    Musculoskeletal: Normal range of motion. He exhibits no edema or deformity.  Neurological: He is alert.  Skin: Skin is warm and dry. He is not diaphoretic.  Psychiatric: His behavior is normal. Judgment normal. His mood appears anxious. He exhibits a depressed mood. He expresses suicidal ideation. He expresses no homicidal ideation. He expresses no suicidal plans and no homicidal plans.  Pt is having more panic attacks -- he almost went to behavioral health last night   A friend is staying with him right now Pt has had suicidal thoughts but states he is not going to hurt himself or anyone else  Nursing note and vitals reviewed.   BP 116/80 (BP Location: Right Arm, Cuff Size: Normal)   Pulse  87   Temp 97.7 F (36.5 C) (Oral)   Resp 16   Ht 5' 9" (1.753 m)   Wt 211 lb 3.2 oz (95.8 kg)   SpO2 96%   BMI 31.19 kg/m  Wt Readings from Last 3 Encounters:  10/01/16  211 lb 3.2 oz (95.8 kg)  09/18/16 213 lb 12.8 oz (97 kg)  08/21/16 217 lb 12.8 oz (98.8 kg)     Lab Results  Component Value Date   WBC 9.1 08/21/2016   HGB 16.9 08/21/2016   HCT 49.2 08/21/2016   PLT 249.0 08/21/2016   GLUCOSE 112 (H) 08/21/2016   CHOL 158 02/13/2016   TRIG 143.0 02/13/2016   HDL 35.20 (L) 02/13/2016   LDLCALC 94 02/13/2016   ALT 34 08/21/2016   AST 24 08/21/2016   NA 138 08/21/2016   K 4.2 08/21/2016   CL 105 08/21/2016   CREATININE 1.03 08/21/2016   BUN 20 08/21/2016   CO2 27 08/21/2016   TSH 1.59 02/13/2016   INR 1.11 10/14/2010   HGBA1C 6.9 (H) 02/13/2016   MICROALBUR 2.5 (H) 02/13/2016    Lab Results  Component Value Date   TSH 1.59 02/13/2016   Lab Results  Component Value Date   WBC 9.1 08/21/2016   HGB 16.9 08/21/2016   HCT 49.2 08/21/2016   MCV 93.3 08/21/2016   PLT 249.0 08/21/2016   Lab Results  Component Value Date   NA 138 08/21/2016   K 4.2 08/21/2016   CO2 27 08/21/2016   GLUCOSE 112 (H) 08/21/2016   BUN 20 08/21/2016   CREATININE 1.03 08/21/2016   BILITOT 0.6 08/21/2016   ALKPHOS 96 08/21/2016   AST 24 08/21/2016   ALT 34 08/21/2016   PROT 7.7 08/21/2016   ALBUMIN 4.5 08/21/2016   CALCIUM 9.7 08/21/2016   ANIONGAP 12 03/10/2016   GFR 81.33 08/21/2016   Lab Results  Component Value Date   CHOL 158 02/13/2016   Lab Results  Component Value Date   HDL 35.20 (L) 02/13/2016   Lab Results  Component Value Date   LDLCALC 94 02/13/2016   Lab Results  Component Value Date   TRIG 143.0 02/13/2016   Lab Results  Component Value Date   CHOLHDL 4 02/13/2016   Lab Results  Component Value Date   HGBA1C 6.9 (H) 02/13/2016       Assessment & Plan:   Problem List Items Addressed This Visit      Unprioritized   Major depression,  recurrent ([place])    Bipolar Encouraged pt to f/u psych Will restart lexapro Pt has a friend staying with him and states he promises to go to hosp if he starts to have suicidal thoughts      Relevant Medications   traZODone (DESYREL) 50 MG tablet   escitalopram (LEXAPRO) 20 MG tablet    Other Visit Diagnoses    Incisional hernia, without obstruction or gangrene    -  Primary   Relevant Medications   oxyCODONE-acetaminophen (ROXICET) 5-325 MG tablet   Other Relevant Orders   Ambulatory referral to General Surgery   Insomnia, unspecified type       Relevant Medications   traZODone (DESYREL) 50 MG tablet   Anxiety       Relevant Medications   traZODone (DESYREL) 50 MG tablet   escitalopram (LEXAPRO) 20 MG tablet   Other Relevant Orders   Lipid panel   Comprehensive metabolic panel   CBC with Differential/Platelet   TSH   Essential hypertension       Relevant Orders   ECHOCARDIOGRAM COMPLETE   Lipid panel   Comprehensive metabolic panel   CBC with Differential/Platelet   TSH   Congestive heart failure, unspecified HF chronicity, unspecified heart failure type ([place])  Relevant Orders   ECHOCARDIOGRAM COMPLETE   Lipid panel   Comprehensive metabolic panel   CBC with Differential/Platelet   TSH   Hyperlipidemia LDL goal <100       Relevant Orders   Lipid panel   Comprehensive metabolic panel   Bipolar and related disorder ([place])       Type 2 diabetes mellitus with complication, without long-term current use of insulin (HCC)       Relevant Orders   Hemoglobin A1c    *--ptgiven names and numbers of psych-- to reestablish We will get him an appointment with the surgeon in [place]    I am having Mr. Brisco start on oxyCODONE-acetaminophen and escitalopram. I am also having him maintain his polyethylene glycol, glucose blood, ONETOUCH DELICA LANCETS FINE, pantoprazole, metFORMIN, aspirin EC, multivitamin with minerals, ibuprofen, rosuvastatin, NONFORMULARY OR COMPOUNDED  ITEM, and traZODone.  Meds ordered this encounter  Medications  . traZODone (DESYREL) 50 MG tablet    Sig: Take 1 tablet (50 mg total) by mouth at bedtime as needed for sleep.    Dispense:  30 tablet    Refill:  3  . oxyCODONE-acetaminophen (ROXICET) 5-325 MG tablet    Sig: Take 1 tablet by mouth every 8 (eight) hours as needed for severe pain.    Dispense:  20 tablet    Refill:  0  . escitalopram (LEXAPRO) 20 MG tablet    Sig: Take 1 tablet (20 mg total) by mouth daily.    Dispense:  90 tablet    Refill:  1     Ann Held, DO

## 2016-10-01 NOTE — Patient Instructions (Signed)

## 2016-10-01 NOTE — Progress Notes (Signed)
Pre visit review using our clinic review tool, if applicable. No additional management support is needed unless otherwise documented below in the visit note. 

## 2016-10-01 NOTE — Assessment & Plan Note (Signed)
Bipolar Encouraged pt to f/u psych Will restart lexapro Pt has a friend staying with him and states he promises to go to hosp if he starts to have suicidal thoughts

## 2016-10-02 ENCOUNTER — Emergency Department (HOSPITAL_COMMUNITY)
Admission: EM | Admit: 2016-10-02 | Discharge: 2016-10-03 | Disposition: A | Discharge status: Other Acute Inpatient Hospital | Payer: PPO | Attending: Emergency Medicine | Admitting: Emergency Medicine

## 2016-10-02 ENCOUNTER — Telehealth (HOSPITAL_COMMUNITY): Payer: Self-pay | Admitting: Family Medicine

## 2016-10-02 ENCOUNTER — Encounter (HOSPITAL_COMMUNITY): Payer: Self-pay

## 2016-10-02 DIAGNOSIS — Z7984 Long term (current) use of oral hypoglycemic drugs: Secondary | ICD-10-CM | POA: Diagnosis not present

## 2016-10-02 DIAGNOSIS — R45851 Suicidal ideations: Secondary | ICD-10-CM

## 2016-10-02 DIAGNOSIS — F339 Major depressive disorder, recurrent, unspecified: Secondary | ICD-10-CM | POA: Diagnosis present

## 2016-10-02 DIAGNOSIS — F1010 Alcohol abuse, uncomplicated: Secondary | ICD-10-CM | POA: Diagnosis not present

## 2016-10-02 DIAGNOSIS — I509 Heart failure, unspecified: Secondary | ICD-10-CM | POA: Diagnosis not present

## 2016-10-02 DIAGNOSIS — Z7982 Long term (current) use of aspirin: Secondary | ICD-10-CM | POA: Insufficient documentation

## 2016-10-02 DIAGNOSIS — E119 Type 2 diabetes mellitus without complications: Secondary | ICD-10-CM | POA: Insufficient documentation

## 2016-10-02 DIAGNOSIS — Y908 Blood alcohol level of 240 mg/100 ml or more: Secondary | ICD-10-CM | POA: Diagnosis not present

## 2016-10-02 DIAGNOSIS — F1020 Alcohol dependence, uncomplicated: Secondary | ICD-10-CM | POA: Diagnosis not present

## 2016-10-02 DIAGNOSIS — Z79899 Other long term (current) drug therapy: Secondary | ICD-10-CM | POA: Insufficient documentation

## 2016-10-02 DIAGNOSIS — Z87891 Personal history of nicotine dependence: Secondary | ICD-10-CM | POA: Insufficient documentation

## 2016-10-02 DIAGNOSIS — I110 Hypertensive heart disease with heart failure: Secondary | ICD-10-CM | POA: Insufficient documentation

## 2016-10-02 DIAGNOSIS — F3340 Major depressive disorder, recurrent, in remission, unspecified: Secondary | ICD-10-CM | POA: Diagnosis not present

## 2016-10-02 LAB — CBC
HEMATOCRIT: 44.3 % (ref 39.0–52.0)
HEMOGLOBIN: 15.6 g/dL (ref 13.0–17.0)
MCH: 31.8 pg (ref 26.0–34.0)
MCHC: 35.2 g/dL (ref 30.0–36.0)
MCV: 90.2 fL (ref 78.0–100.0)
Platelets: 288 10*3/uL (ref 150–400)
RBC: 4.91 MIL/uL (ref 4.22–5.81)
RDW: 12.8 % (ref 11.5–15.5)
WBC: 9.9 10*3/uL (ref 4.0–10.5)

## 2016-10-02 LAB — COMPREHENSIVE METABOLIC PANEL
ALBUMIN: 4.3 g/dL (ref 3.5–5.0)
ALK PHOS: 80 U/L (ref 38–126)
ALT: 32 U/L (ref 17–63)
AST: 22 U/L (ref 15–41)
Anion gap: 13 (ref 5–15)
BUN: 19 mg/dL (ref 6–20)
CALCIUM: 9.1 mg/dL (ref 8.9–10.3)
CO2: 21 mmol/L — ABNORMAL LOW (ref 22–32)
CREATININE: 1.04 mg/dL (ref 0.61–1.24)
Chloride: 101 mmol/L (ref 101–111)
GFR calc Af Amer: >60 mL/min (ref >60)
GFR calc non Af Amer: >60 mL/min (ref >60)
GLUCOSE: 148 mg/dL — AB (ref 65–99)
Potassium: 3.3 mmol/L — ABNORMAL LOW (ref 3.5–5.1)
SODIUM: 135 mmol/L (ref 135–145)
Total Bilirubin: 0.7 mg/dL (ref 0.3–1.2)
Total Protein: 7.7 g/dL (ref 6.5–8.1)

## 2016-10-02 LAB — RAPID URINE DRUG SCREEN, HOSP PERFORMED
AMPHETAMINES: NOT DETECTED
BARBITURATES: NOT DETECTED
Benzodiazepines: NOT DETECTED
Cocaine: NOT DETECTED
Opiates: NOT DETECTED
TETRAHYDROCANNABINOL: NOT DETECTED

## 2016-10-02 LAB — ACETAMINOPHEN LEVEL: Acetaminophen (Tylenol), Serum: 14 ug/mL (ref 10–30)

## 2016-10-02 LAB — SALICYLATE LEVEL: Salicylate Lvl: <7 mg/dL (ref 2.8–30.0)

## 2016-10-02 LAB — CBG MONITORING, ED: Glucose-Capillary: 140 mg/dL — ABNORMAL HIGH (ref 65–99)

## 2016-10-02 LAB — ETHANOL: Alcohol, Ethyl (B): 246 mg/dL — ABNORMAL HIGH (ref <5)

## 2016-10-02 MED ORDER — ACETAMINOPHEN 325 MG PO TABS
650.0000 mg | ORAL_TABLET | Freq: Once | ORAL | Status: AC
  Administered 2016-10-02: 650 mg via ORAL
  Filled 2016-10-02: qty 2

## 2016-10-02 MED ORDER — NICOTINE 21 MG/24HR TD PT24
21.0000 mg | MEDICATED_PATCH | Freq: Every day | TRANSDERMAL | Status: DC
  Administered 2016-10-02 – 2016-10-03 (×2): 21 mg via TRANSDERMAL
  Filled 2016-10-02 (×2): qty 1

## 2016-10-02 MED ORDER — LORAZEPAM 1 MG PO TABS
2.0000 mg | ORAL_TABLET | Freq: Once | ORAL | Status: AC
  Administered 2016-10-02: 2 mg via ORAL
  Filled 2016-10-02: qty 2

## 2016-10-02 NOTE — Telephone Encounter (Signed)
I called the patient and spoke with him about scheduling an echo appt. He voiced that he was not feeling well and was going to go to [facility]. I informed his that I had an opening on 5/11 for an echo and he said that he did not know how long they would be keeping him and so he would call back later to schedule.

## 2016-10-02 NOTE — ED Notes (Signed)
Pt stated "I feel like I need somebody to watch me.  I'm still not in a good state of mind."  Questioned what kind of work pt did/has done.  Pt stated "I worked in [employer].  I used to work for [employer] and then [employer] when they bought it out.  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

## 2016-10-02 NOTE — ED Notes (Addendum)
Pt requesting "something for pain.  I usually get morphine.  I have 16 surgeries on my abd, have been in septic shock.  I also need my CPAP machine."  Informed pt will have to obtain order for CPAP & will speak to MD r/t pain meds.  Pt then stated "I may have drunk and took my percocet.  I don't remember.  I have started drinking a lot lately.  I've been drinking x 3 days.  I'll just takey some tylenol if I may."

## 2016-10-02 NOTE — ED Provider Notes (Signed)
[facility]-EMERGENCY DEPT Provider Note   CSN: [id number] Arrival date & time: 10/02/16  1338     History   Chief Complaint Chief Complaint  Patient presents with  . Suicidal  . Ingestion    HPI Lucas Small is a 51 y.o. male.  Level V caveat intoxication.  Patient states he has SI.  Hx of prior in the past, drinking today and took some oxycodone tablets.  Denies other injestion.  Requesting morphine for pain.  Otherwise not helpful in further hx.    The history is provided by the patient.  Illness  This is a new problem. The current episode started 12 to 24 hours ago. The problem occurs constantly. The problem has not changed since onset.Pertinent negatives include no chest pain, no abdominal pain, no headaches and no shortness of breath. Nothing aggravates the symptoms. Nothing relieves the symptoms. He has tried nothing for the symptoms. The treatment provided no relief.    Past Medical History:  Diagnosis Date  . Alcoholic (HCC)   . CHF (congestive heart failure) (HCC)   . Depression   . Diabetes mellitus without complication (HCC)    metformin  . Dyspnea    with exertion .  Marland Kitchen Dysrhythmia    while hospitalized in [place]  . History of kidney stones   . Hypertension   . MRSA (methicillin resistant Staphylococcus aureus)   . Multiple myeloma (HCC) 1990's  . Perforation bowel (HCC)   . PNA (pneumonia)   . Renal disorder   . Renal failure   . Renal insufficiency   . Respiratory failure (HCC)   . Sickle cell anemia (HCC)   . Sleep apnea    no CPAP machine    Patient Active Problem List   Diagnosis Date Noted  . OSA (obstructive sleep apnea) 04/25/2016  . Back pain 12/15/2015  . GERD (gastroesophageal reflux disease) 12/15/2015  . DM (diabetes mellitus) type II uncontrolled, periph vascular disorder (HCC) 12/15/2015  . Hyperlipidemia LDL goal <70 12/15/2015  . Diverticulitis 03/05/2013  . Tobacco dependency 03/05/2013  . DOE (dyspnea on exertion) 03/05/2013    . Major depression, recurrent (HCC) 11/24/2012  . Snoring disorder 08/27/2011  . Substance induced mood disorder (HCC) 08/23/2011    Class: Acute  . Alcohol dependency (HCC) 08/23/2011    Class: Acute    Past Surgical History:  Procedure Laterality Date  . ABDOMINAL SURGERY    . CARDIAC SURGERY    . HERNIA REPAIR    . ILEOSTOMY    . PERIPHERALLY INSERTED CENTRAL CATHETER INSERTION         Home Medications    Prior to Admission medications   Medication Sig Start Date End Date Taking? Authorizing Provider  aspirin EC 81 MG tablet Take 81 mg by mouth daily as needed.    Yes [provider]  escitalopram (LEXAPRO) 20 MG tablet Take 1 tablet (20 mg total) by mouth daily. 10/01/16  Yes Seabron Spates R, DO  ibuprofen (ADVIL,MOTRIN) 200 MG tablet Take 200 mg by mouth as needed.   Yes [provider]  metFORMIN (GLUCOPHAGE) 500 MG tablet Take 1 tablet (500 mg total) by mouth 2 (two) times daily with a meal. 02/14/16  Yes Donato Schultz, DO  Multiple Vitamin (MULTIVITAMIN WITH MINERALS) TABS tablet Take 1 tablet by mouth daily.   Yes [provider]  oxyCODONE-acetaminophen (ROXICET) 5-325 MG tablet Take 1 tablet by mouth every 8 (eight) hours as needed for severe pain. 10/01/16  Yes  Carollee Herter, Yvonne R, DO  pantoprazole (PROTONIX) 40 MG tablet Take 1 tablet (40 mg total) by mouth 2 (two) times daily. Reported on 07/11/2015 Patient taking differently: Take 40 mg by mouth 2 (two) times daily.  02/14/16  Yes Roma Schanz R, DO  polyethylene glycol (MIRALAX / GLYCOLAX) packet Take 17 g by mouth daily as needed for mild constipation. Reported on 07/11/2015 08/18/15  Yes Roma Schanz R, DO  rosuvastatin (CRESTOR) 20 MG tablet TAKE 1 TABLET(20 MG) BY MOUTH DAILY 09/14/16  Yes Roma Schanz R, DO  traZODone (DESYREL) 50 MG tablet Take 1 tablet (50 mg total) by mouth at bedtime as needed for sleep. 10/01/16  Yes Roma Schanz R, DO  glucose  blood (ONETOUCH VERIO) test strip Check Blood sugar twice daily. Dx:E11.9 11/01/15   Roma Schanz R, DO  NONFORMULARY OR COMPOUNDED ITEM Abdominal binder dx abd hernias 09/18/16   Carollee Herter, Alferd Apa, DO  ONETOUCH DELICA LANCETS FINE MISC Check Blood sugar twice daily. Dx:E11.9 11/01/15   Ann Held, DO    Family History History reviewed. No pertinent family history.  Social History Social History  Substance Use Topics  . Smoking status: Former Smoker    Packs/day: 1.00    Years: 28.00    Types: Cigarettes    Quit date: 08/28/2016  . Smokeless tobacco: Never Used  . Alcohol use Yes     Comment: occasionally     Allergies   Bee venom   Review of Systems Review of Systems  Constitutional: Negative for chills and fever.  HENT: Negative for congestion and facial swelling.   Eyes: Negative for discharge and visual disturbance.  Respiratory: Negative for shortness of breath.   Cardiovascular: Negative for chest pain and palpitations.  Gastrointestinal: Negative for abdominal pain, diarrhea and vomiting.  Musculoskeletal: Negative for arthralgias and myalgias.  Skin: Negative for color change and rash.  Neurological: Negative for tremors, syncope and headaches.  Psychiatric/Behavioral: Positive for suicidal ideas. Negative for confusion and dysphoric mood.     Physical Exam Updated Vital Signs BP 117/69 (BP Location: Right Arm)   Pulse 81   Temp 98.1 F (36.7 C) (Oral)   Resp 18   SpO2 94%   Physical Exam  Constitutional: He appears well-developed and well-nourished.  etoh on breath  HENT:  Head: Normocephalic and atraumatic.  Eyes: EOM are normal. Pupils are equal, round, and reactive to light.  Neck: Normal range of motion. Neck supple. No JVD present.  Cardiovascular: Normal rate and regular rhythm.  Exam reveals no gallop and no friction rub.   No murmur heard. Pulmonary/Chest: No respiratory distress. He has no wheezes.  Abdominal: He exhibits no  distension and no mass. There is no tenderness. There is no rebound and no guarding.  Musculoskeletal: Normal range of motion.  Neurological: He is alert.  Clinically intoxicated  Skin: No rash noted. No pallor.  Psychiatric: He has a normal mood and affect. His behavior is normal.  Nursing note and vitals reviewed.    ED Treatments / Results  Labs (all labs ordered are listed, but only abnormal results are displayed) Labs Reviewed  COMPREHENSIVE METABOLIC PANEL - Abnormal; Notable for the following:       Result Value   Potassium 3.3 (*)    CO2 21 (*)    Glucose, Bld 148 (*)    All other components within normal limits  ETHANOL - Abnormal; Notable for the following:    Alcohol,  Ethyl (B) 246 (*)    All other components within normal limits  CBG MONITORING, ED - Abnormal; Notable for the following:    Glucose-Capillary 140 (*)    All other components within normal limits  SALICYLATE LEVEL  ACETAMINOPHEN LEVEL  CBC  RAPID URINE DRUG SCREEN, HOSP PERFORMED    EKG  EKG Interpretation None       Radiology No results found.  Procedures Procedures (including critical care time)  Medications Ordered in ED Medications  nicotine (NICODERM CQ - dosed in mg/24 hours) patch 21 mg (21 mg Transdermal Patch Applied 10/02/16 1835)  acetaminophen (TYLENOL) tablet 650 mg (650 mg Oral Given 10/02/16 2039)  LORazepam (ATIVAN) tablet 2 mg (2 mg Oral Given 10/02/16 2039)     Initial Impression / Assessment and Plan / ED Course  I have reviewed the triage vital signs and the nursing notes.  Pertinent labs & imaging results that were available during my care of the patient were reviewed by me and considered in my medical decision making (see chart for details).     51 yo M with SI.  Clinically intoxicated.  Will have TTS eval.  Labs reassuring.  Medically clear once clinically sober.  The patients results and plan were reviewed and discussed.   Any x-rays performed were independently  reviewed by myself.   Differential diagnosis were considered with the presenting HPI.  Medications  nicotine (NICODERM CQ - dosed in mg/24 hours) patch 21 mg (21 mg Transdermal Patch Applied 10/02/16 1835)  acetaminophen (TYLENOL) tablet 650 mg (650 mg Oral Given 10/02/16 2039)  LORazepam (ATIVAN) tablet 2 mg (2 mg Oral Given 10/02/16 2039)    Vitals:   10/02/16 1354 10/02/16 1928  BP: 110/84 117/69  Pulse: 91 81  Resp: 18 18  Temp: 97.9 F (36.6 C) 98.1 F (36.7 C)  TempSrc: Oral Oral  SpO2: 95% 94%    Final diagnoses:  Suicidal ideation  Alcohol abuse    Admission/ observation were discussed with the admitting physician, patient and/or family and they are comfortable with the plan.    Final Clinical Impressions(s) / ED Diagnoses   Final diagnoses:  Suicidal ideation  Alcohol abuse    New Prescriptions New Prescriptions   No medications on file     Deno Etienne, DO 10/02/16 2135
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
## 2016-10-02 NOTE — ED Triage Notes (Signed)
Pt BIB friend. They report that pt took several percocets and drank a pint and a half of liquor. Pt belligerent and screaming that he "needs morphine now." Pt reports that he would rather die than go back on the street.

## 2016-10-02 NOTE — ED Notes (Signed)
Pt admitted to taking percocet and alcohol. Pt requesting morphine for pain and states he does not feel "secure" and he feels like a burden since wife died.

## 2016-10-02 NOTE — ED Notes (Signed)
Spoke with Quillian Quince, RT, and informed pt is requesting CPAP @ this time.

## 2016-10-02 NOTE — ED Notes (Signed)
Daniel, RT, in with pt.

## 2016-10-02 NOTE — ED Notes (Addendum)
Pt oriented to room and unit.  Pt is pleasant and cooperative.  Pt is asking for pain medication stating that last time he was in the hospital they gave him morphine.  When asked if patient made a suicide attempt he stated "I have thought about it"   Pt contracts for safety.

## 2016-10-02 NOTE — ED Notes (Signed)
Pt noted to walk up to Nurses' station and scream at staff.  Pt directed back to bed.  Pt continued to screaming at staff.  Sts "I have insurance and pay thousands of dollars for you to be my servant.  You will not ignore me when I want water.  I pay thousands of dollars and should be in a room."  Staff informed Pt they are giving shift report and asked him to stop yelling.  Security at bedside.  Pt continued to make inappropriate and belittling comments.    Tyrone Nine EDP made aware.  Sts the Pt is upset, because he wants morphine and was informed that none will be ordered.  Since Pt is coherent and ambulatory, a TTS consult was ordered.

## 2016-10-03 ENCOUNTER — Inpatient Hospital Stay (HOSPITAL_COMMUNITY)
Admission: AD | Admit: 2016-10-03 | Discharge: 2016-10-12 | DRG: 885 | Disposition: A | Discharge status: Home / Self Care | Payer: PPO | Source: Intra-hospital | Attending: Psychiatry | Admitting: Psychiatry

## 2016-10-03 ENCOUNTER — Encounter (HOSPITAL_COMMUNITY): Payer: Self-pay | Admitting: *Deleted

## 2016-10-03 DIAGNOSIS — R45851 Suicidal ideations: Secondary | ICD-10-CM

## 2016-10-03 DIAGNOSIS — F3340 Major depressive disorder, recurrent, in remission, unspecified: Secondary | ICD-10-CM | POA: Diagnosis not present

## 2016-10-03 DIAGNOSIS — I110 Hypertensive heart disease with heart failure: Secondary | ICD-10-CM | POA: Diagnosis present

## 2016-10-03 DIAGNOSIS — I509 Heart failure, unspecified: Secondary | ICD-10-CM | POA: Diagnosis not present

## 2016-10-03 DIAGNOSIS — G4733 Obstructive sleep apnea (adult) (pediatric): Secondary | ICD-10-CM | POA: Diagnosis not present

## 2016-10-03 DIAGNOSIS — Z7984 Long term (current) use of oral hypoglycemic drugs: Secondary | ICD-10-CM | POA: Diagnosis not present

## 2016-10-03 DIAGNOSIS — E785 Hyperlipidemia, unspecified: Secondary | ICD-10-CM | POA: Diagnosis not present

## 2016-10-03 DIAGNOSIS — F1010 Alcohol abuse, uncomplicated: Secondary | ICD-10-CM | POA: Diagnosis present

## 2016-10-03 DIAGNOSIS — K219 Gastro-esophageal reflux disease without esophagitis: Secondary | ICD-10-CM | POA: Diagnosis present

## 2016-10-03 DIAGNOSIS — Z87891 Personal history of nicotine dependence: Secondary | ICD-10-CM | POA: Diagnosis not present

## 2016-10-03 DIAGNOSIS — Z79899 Other long term (current) drug therapy: Secondary | ICD-10-CM

## 2016-10-03 DIAGNOSIS — E1151 Type 2 diabetes mellitus with diabetic peripheral angiopathy without gangrene: Secondary | ICD-10-CM | POA: Diagnosis not present

## 2016-10-03 DIAGNOSIS — F332 Major depressive disorder, recurrent severe without psychotic features: Principal | ICD-10-CM | POA: Diagnosis present

## 2016-10-03 DIAGNOSIS — F329 Major depressive disorder, single episode, unspecified: Secondary | ICD-10-CM | POA: Diagnosis not present

## 2016-10-03 DIAGNOSIS — E1165 Type 2 diabetes mellitus with hyperglycemia: Secondary | ICD-10-CM | POA: Diagnosis present

## 2016-10-03 DIAGNOSIS — Y908 Blood alcohol level of 240 mg/100 ml or more: Secondary | ICD-10-CM | POA: Diagnosis not present

## 2016-10-03 DIAGNOSIS — F17210 Nicotine dependence, cigarettes, uncomplicated: Secondary | ICD-10-CM | POA: Diagnosis not present

## 2016-10-03 LAB — CBG MONITORING, ED: Glucose-Capillary: 101 mg/dL — ABNORMAL HIGH (ref 65–99)

## 2016-10-03 LAB — GLUCOSE, CAPILLARY
GLUCOSE-CAPILLARY: 195 mg/dL — AB (ref 65–99)
Glucose-Capillary: 149 mg/dL — ABNORMAL HIGH (ref 65–99)

## 2016-10-03 MED ORDER — TRAZODONE HCL 50 MG PO TABS: 50.0000 mg | ORAL_TABLET | Freq: Every evening | ORAL | Status: DC | PRN

## 2016-10-03 MED ORDER — CHLORDIAZEPOXIDE HCL 25 MG PO CAPS: 25.0000 mg | ORAL_CAPSULE | Freq: Four times a day (QID) | ORAL | Status: DC | PRN

## 2016-10-03 MED ORDER — ONDANSETRON 4 MG PO TBDP: 4.0000 mg | ORAL_TABLET | Freq: Four times a day (QID) | ORAL | Status: DC | PRN

## 2016-10-03 MED ORDER — CHLORDIAZEPOXIDE HCL 25 MG PO CAPS
25.0000 mg | ORAL_CAPSULE | Freq: Four times a day (QID) | ORAL | Status: AC | PRN
  Administered 2016-10-03: 25 mg via ORAL
  Filled 2016-10-03: qty 1

## 2016-10-03 MED ORDER — HYDROXYZINE HCL 25 MG PO TABS
25.0000 mg | ORAL_TABLET | Freq: Four times a day (QID) | ORAL | Status: AC | PRN
Start: 2016-10-03 — End: 2016-10-06
  Administered 2016-10-05: 25 mg via ORAL
  Filled 2016-10-03: qty 1

## 2016-10-03 MED ORDER — POTASSIUM CHLORIDE CRYS ER 20 MEQ PO TBCR
40.0000 meq | EXTENDED_RELEASE_TABLET | Freq: Once | ORAL | Status: AC
  Administered 2016-10-03: 40 meq via ORAL
  Filled 2016-10-03: qty 2

## 2016-10-03 MED ORDER — CHLORDIAZEPOXIDE HCL 25 MG PO CAPS: 25.0000 mg | ORAL_CAPSULE | Freq: Every day | ORAL | Status: DC

## 2016-10-03 MED ORDER — CHLORDIAZEPOXIDE HCL 25 MG PO CAPS
25.0000 mg | ORAL_CAPSULE | ORAL | Status: AC
  Administered 2016-10-06 (×2): 25 mg via ORAL
  Filled 2016-10-03 (×2): qty 1

## 2016-10-03 MED ORDER — LOPERAMIDE HCL 2 MG PO CAPS: 2.0000 mg | ORAL_CAPSULE | ORAL | Status: DC | PRN

## 2016-10-03 MED ORDER — TRAZODONE HCL 100 MG PO TABS
100.0000 mg | ORAL_TABLET | Freq: Every evening | ORAL | Status: DC | PRN
  Administered 2016-10-03 – 2016-10-09 (×7): 100 mg via ORAL
  Filled 2016-10-03 (×7): qty 1

## 2016-10-03 MED ORDER — INSULIN ASPART 100 UNIT/ML ~~LOC~~ SOLN
3.0000 [IU] | Freq: Three times a day (TID) | SUBCUTANEOUS | Status: DC
  Administered 2016-10-03 – 2016-10-12 (×26): 3 [IU] via SUBCUTANEOUS

## 2016-10-03 MED ORDER — ASPIRIN EC 81 MG PO TBEC
81.0000 mg | DELAYED_RELEASE_TABLET | Freq: Every day | ORAL | Status: DC
  Filled 2016-10-03: qty 1

## 2016-10-03 MED ORDER — ONDANSETRON 4 MG PO TBDP
4.0000 mg | ORAL_TABLET | Freq: Four times a day (QID) | ORAL | Status: AC | PRN
  Administered 2016-10-04: 4 mg via ORAL
  Filled 2016-10-03: qty 1

## 2016-10-03 MED ORDER — CHLORDIAZEPOXIDE HCL 25 MG PO CAPS: 25.0000 mg | ORAL_CAPSULE | Freq: Three times a day (TID) | ORAL | Status: DC

## 2016-10-03 MED ORDER — HYDROXYZINE HCL 25 MG PO TABS: 25.0000 mg | ORAL_TABLET | Freq: Four times a day (QID) | ORAL | Status: DC | PRN

## 2016-10-03 MED ORDER — THIAMINE HCL 100 MG/ML IJ SOLN
100.0000 mg | Freq: Once | INTRAMUSCULAR | Status: AC
  Administered 2016-10-03: 100 mg via INTRAMUSCULAR
  Filled 2016-10-03: qty 2

## 2016-10-03 MED ORDER — ADULT MULTIVITAMIN W/MINERALS CH
1.0000 | ORAL_TABLET | Freq: Every day | ORAL | Status: DC
  Administered 2016-10-03: 1 via ORAL
  Filled 2016-10-03: qty 1

## 2016-10-03 MED ORDER — VITAMIN B-1 100 MG PO TABS: 100.0000 mg | ORAL_TABLET | Freq: Every day | ORAL | Status: DC

## 2016-10-03 MED ORDER — NICOTINE 21 MG/24HR TD PT24
21.0000 mg | MEDICATED_PATCH | Freq: Every day | TRANSDERMAL | Status: DC
  Administered 2016-10-04 – 2016-10-12 (×9): 21 mg via TRANSDERMAL
  Filled 2016-10-03 (×11): qty 1

## 2016-10-03 MED ORDER — ESCITALOPRAM OXALATE 10 MG PO TABS
20.0000 mg | ORAL_TABLET | Freq: Every day | ORAL | Status: DC
  Administered 2016-10-03: 20 mg via ORAL
  Filled 2016-10-03: qty 2

## 2016-10-03 MED ORDER — METFORMIN HCL 500 MG PO TABS
500.0000 mg | ORAL_TABLET | Freq: Two times a day (BID) | ORAL | Status: DC
  Administered 2016-10-03 – 2016-10-12 (×18): 500 mg via ORAL
  Filled 2016-10-03 (×21): qty 1

## 2016-10-03 MED ORDER — ADULT MULTIVITAMIN W/MINERALS CH
1.0000 | ORAL_TABLET | Freq: Every day | ORAL | Status: DC
  Administered 2016-10-04 – 2016-10-12 (×9): 1 via ORAL
  Filled 2016-10-03 (×12): qty 1

## 2016-10-03 MED ORDER — CHLORDIAZEPOXIDE HCL 25 MG PO CAPS
25.0000 mg | ORAL_CAPSULE | Freq: Four times a day (QID) | ORAL | Status: AC
  Administered 2016-10-03 – 2016-10-04 (×6): 25 mg via ORAL
  Filled 2016-10-03 (×6): qty 1

## 2016-10-03 MED ORDER — ROSUVASTATIN CALCIUM 20 MG PO TABS
20.0000 mg | ORAL_TABLET | Freq: Every day | ORAL | Status: DC
  Filled 2016-10-03: qty 1

## 2016-10-03 MED ORDER — CHLORDIAZEPOXIDE HCL 25 MG PO CAPS
25.0000 mg | ORAL_CAPSULE | Freq: Every day | ORAL | Status: AC
  Administered 2016-10-07: 25 mg via ORAL
  Filled 2016-10-03: qty 1

## 2016-10-03 MED ORDER — LOPERAMIDE HCL 2 MG PO CAPS
2.0000 mg | ORAL_CAPSULE | ORAL | Status: AC | PRN
  Administered 2016-10-04: 2 mg via ORAL
  Filled 2016-10-03: qty 1

## 2016-10-03 MED ORDER — IBUPROFEN 200 MG PO TABS: 400.0000 mg | ORAL_TABLET | ORAL | Status: DC | PRN

## 2016-10-03 MED ORDER — CHLORDIAZEPOXIDE HCL 25 MG PO CAPS
25.0000 mg | ORAL_CAPSULE | Freq: Four times a day (QID) | ORAL | Status: DC
  Administered 2016-10-03 (×2): 25 mg via ORAL
  Filled 2016-10-03 (×2): qty 1

## 2016-10-03 MED ORDER — THIAMINE HCL 100 MG/ML IJ SOLN: 100.0000 mg | Freq: Once | INTRAMUSCULAR | Status: DC

## 2016-10-03 MED ORDER — ACETAMINOPHEN 325 MG PO TABS
650.0000 mg | ORAL_TABLET | Freq: Four times a day (QID) | ORAL | Status: DC | PRN
  Administered 2016-10-03 – 2016-10-11 (×9): 650 mg via ORAL
  Filled 2016-10-03 (×9): qty 2

## 2016-10-03 MED ORDER — ALUM & MAG HYDROXIDE-SIMETH 200-200-20 MG/5ML PO SUSP
30.0000 mL | ORAL | Status: DC | PRN
  Administered 2016-10-04 – 2016-10-08 (×3): 30 mL via ORAL
  Filled 2016-10-03 (×3): qty 30

## 2016-10-03 MED ORDER — METFORMIN HCL 500 MG PO TABS: 500.0000 mg | ORAL_TABLET | Freq: Two times a day (BID) | ORAL | Status: DC

## 2016-10-03 MED ORDER — VITAMIN B-1 100 MG PO TABS
100.0000 mg | ORAL_TABLET | Freq: Every day | ORAL | Status: DC
  Administered 2016-10-04 – 2016-10-12 (×9): 100 mg via ORAL
  Filled 2016-10-03 (×11): qty 1

## 2016-10-03 MED ORDER — CHLORDIAZEPOXIDE HCL 25 MG PO CAPS: 25.0000 mg | ORAL_CAPSULE | ORAL | Status: DC

## 2016-10-03 MED ORDER — INSULIN ASPART 100 UNIT/ML ~~LOC~~ SOLN
0.0000 [IU] | Freq: Three times a day (TID) | SUBCUTANEOUS | Status: DC
  Administered 2016-10-03: 3 [IU] via SUBCUTANEOUS
  Administered 2016-10-04: 5 [IU] via SUBCUTANEOUS
  Administered 2016-10-04 (×2): 2 [IU] via SUBCUTANEOUS
  Administered 2016-10-05 (×2): 3 [IU] via SUBCUTANEOUS
  Administered 2016-10-05 – 2016-10-06 (×2): 5 [IU] via SUBCUTANEOUS
  Administered 2016-10-06: 2 [IU] via SUBCUTANEOUS
  Administered 2016-10-06: 5 [IU] via SUBCUTANEOUS
  Administered 2016-10-07: 8 [IU] via SUBCUTANEOUS
  Administered 2016-10-07: 2 [IU] via SUBCUTANEOUS
  Administered 2016-10-07: 5 [IU] via SUBCUTANEOUS
  Administered 2016-10-08: 3 [IU] via SUBCUTANEOUS
  Administered 2016-10-08: 5 [IU] via SUBCUTANEOUS
  Administered 2016-10-08: 3 [IU] via SUBCUTANEOUS
  Administered 2016-10-09 (×2): 5 [IU] via SUBCUTANEOUS
  Administered 2016-10-09: 2 [IU] via SUBCUTANEOUS
  Administered 2016-10-10: 3 [IU] via SUBCUTANEOUS
  Administered 2016-10-10: 5 [IU] via SUBCUTANEOUS
  Administered 2016-10-10: 3 [IU] via SUBCUTANEOUS
  Administered 2016-10-11: 5 [IU] via SUBCUTANEOUS
  Administered 2016-10-11: 3 [IU] via SUBCUTANEOUS
  Administered 2016-10-11: 2 [IU] via SUBCUTANEOUS
  Administered 2016-10-12: 3 [IU] via SUBCUTANEOUS

## 2016-10-03 MED ORDER — ESCITALOPRAM OXALATE 20 MG PO TABS
20.0000 mg | ORAL_TABLET | Freq: Every day | ORAL | Status: DC
  Administered 2016-10-04: 20 mg via ORAL
  Filled 2016-10-03 (×4): qty 1

## 2016-10-03 MED ORDER — CHLORDIAZEPOXIDE HCL 25 MG PO CAPS
25.0000 mg | ORAL_CAPSULE | Freq: Three times a day (TID) | ORAL | Status: AC
  Administered 2016-10-05 (×3): 25 mg via ORAL
  Filled 2016-10-03 (×3): qty 1

## 2016-10-03 NOTE — Tx Team (Signed)
Initial Treatment Plan 10/03/2016 5:40 PM [name] [id number]    PATIENT STRESSORS: Financial difficulties Health problems Loss of friends, lover, and family members to death Substance abuse   PATIENT STRENGTHS: Ability for insight Capable of independent living Motivation for treatment/growth Supportive family/friends   PATIENT IDENTIFIED PROBLEMS: At risk for suicide  Substance Abuse  Depression  "I need help with different issues. Biggest one being disability"  "Coping skills"             DISCHARGE CRITERIA:  Ability to meet basic life and health needs Improved stabilization in mood, thinking, and/or behavior Motivation to continue treatment in a less acute level of care Need for constant or close observation no longer present Reduction of life-threatening or endangering symptoms to within safe limits Verbal commitment to aftercare and medication compliance Withdrawal symptoms are absent or subacute and managed without 24-hour nursing intervention  PRELIMINARY DISCHARGE PLAN: Attend 12-step recovery group Outpatient therapy Return to previous living arrangement  PATIENT/FAMILY INVOLVEMENT: This treatment plan has been presented to and reviewed with the patient, Lucas Small.  The patient and family have been given the opportunity to ask questions and make suggestions.  Dustin Flock, RN 10/03/2016, 5:40 PM

## 2016-10-03 NOTE — Progress Notes (Signed)
10/03/16  [facility] to transport patient to [facility]. Per [place] they will be here as soon as they finish with current patients they are transporting.

## 2016-10-03 NOTE — BH Assessment (Signed)
[place] Assessment Progress Note  Per Corena Pilgrim, MD, this pt requires psychiatric hospitalization at this time.  Letitia Libra, RN, [facility] has assigned pt to [facility] Rm [id number].  Pt has signed Voluntary Admission and Consent for Treatment, as well as Consent to Release Information to his PCP, and signed forms have been faxed to [facility].  Pt's nurse has been notified, and agrees to send original paperwork along with pt via [place], and to call report to [phone].  Jalene Mullet, [place] Triage Specialist [phone]

## 2016-10-03 NOTE — Plan of Care (Signed)
Problem: Self-Concept: Goal: Ability to disclose and discuss suicidal ideas will improve Outcome: Progressing Pt reports SI with a plan to "strangle" self.  He verbally contracts for safety.
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
## 2016-10-03 NOTE — Progress Notes (Signed)
D: Pt was in the dayroom upon initial approach.  Pt presents with depressed, anxious affect and mood.  He reports he is doing "pretty good" and then discloses SI.  He reports he was "thinking about different ways of committing suicide earlier today, I feel uneasy."  He reports a plan to "strangle" self.  Pt verbally contracts for safety.  He denies HI,  denies hallucinations, reports generalized pain of 6/10, reports withdrawal symptoms of anxiety, tremor, agitation, sweats.  Pt has been visible in milieu with few peer interactions.  He did not attend evening group.      A: Introduced self to pt.  Actively listened to pt and offered support and encouragement.  Positive coping skills encouraged and reinforced.  Medications administered per order.  PRN medication administered for withdrawal and sleep.  PRN medication for pain offered, pt declined.  Pt provided with CPAP and surgical binder per order.  Q15 minute safety checks maintained.    R: Pt is safe on the unit.  Pt is compliant with medications.  He reports he will inform staff of needs and concerns.  Pt verbally contracts for safety.  Will continue to monitor and assess.
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
## 2016-10-03 NOTE — Progress Notes (Signed)
Pt did not attend wrap up group this evening.  

## 2016-10-04 DIAGNOSIS — F329 Major depressive disorder, single episode, unspecified: Secondary | ICD-10-CM

## 2016-10-04 DIAGNOSIS — R45851 Suicidal ideations: Secondary | ICD-10-CM

## 2016-10-04 LAB — GLUCOSE, CAPILLARY
GLUCOSE-CAPILLARY: 230 mg/dL — AB (ref 65–99)
GLUCOSE-CAPILLARY: 143 mg/dL — AB (ref 65–99)
Glucose-Capillary: 134 mg/dL — ABNORMAL HIGH (ref 65–99)
Glucose-Capillary: 133 mg/dL — ABNORMAL HIGH (ref 65–99)

## 2016-10-04 MED ORDER — DULOXETINE HCL 20 MG PO CPEP
40.0000 mg | ORAL_CAPSULE | Freq: Every day | ORAL | Status: DC
  Administered 2016-10-04 – 2016-10-12 (×9): 40 mg via ORAL
  Filled 2016-10-04 (×12): qty 2

## 2016-10-04 MED ORDER — GABAPENTIN 300 MG PO CAPS
300.0000 mg | ORAL_CAPSULE | Freq: Three times a day (TID) | ORAL | Status: DC
  Administered 2016-10-04 – 2016-10-12 (×22): 300 mg via ORAL
  Filled 2016-10-04 (×28): qty 1

## 2016-10-04 NOTE — Progress Notes (Signed)
[place] Group Notes:  (Nursing/MHT/Case Management/Adjunct)  Date:  10/04/2016  Time: 2030  Type of Therapy:  wrap up group  Participation Level:  Active  Participation Quality:  Appropriate, Attentive, Sharing and Supportive  Affect:  Appropriate  Cognitive:  Appropriate  Insight:  Improving  Engagement in Group:  Engaged  Modes of Intervention:  Clarification, Education and Support  Summary of Progress/Problems:  Lucas Small 10/04/2016, 9:50 PM

## 2016-10-04 NOTE — B[facility] Group Notes (Signed)
[place] LCSW Group Therapy 10/04/2016 1:15pm  Type of Therapy: Group Therapy- Balance in Life  Participation Level: Active   Description of the Group:  The topic for group was balance in life. Today's group focused on defining balance in one's own words, identifying things that can knock one off balance, and exploring healthy ways to maintain balance in life. Group members were asked to provide an example of a time when they felt off balance, describe how they handled that situation,and process healthier ways to regain balance in the future. Group members were asked to share the most important tool for maintaining balance that they learned while at [facility] and how they plan to apply this method after discharge.  Summary of Patient Progress  Pt states that he is currently feeling very unbalanced in his life. Pt reports that he has multiple medical issues that he feels need to be addressed. He states that he is currently paying for 3 surgeries that he had and is still in chronic pain. Pt was also irritable because he hasn't been able to receive morphine while he is here in the hospital.   Therapeutic Modalities:   Cognitive Behavioral Therapy Solution-Focused Therapy Assertiveness Training   Georga Kaufmann, MSW, [place] 10/04/2016 3:58 PM
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
## 2016-10-04 NOTE — Progress Notes (Signed)
D: Patient is visible in the milieu.  He has requested medication for diarrhea and nausea.  Patient reports fair sleep; appetite is fair; energy is low and concentration is poor.  His goal today is to "seek help with my problems and take action by listening."  Patient reports withdrawal symptoms as tremors, agitation, cramping, nausea and irritability.  He rates his depression and anxiety as a 9; hopelessness as a 7.  Patient reports passive thoughts of self harm with no specific plan.  Patient came to nurse and requested "percocet."  Informed patient that he did not have any percocet ordered.  Patient states, "then give me some morphine."  Patient indicated that if we did not order pain medication for him that he will "have to go next door" indicating the ED. A: Continue to monitor medication management and MD orders.  Safety checks completed every 15 minutes per protocol.  Offer support and encouragement as needed. R: Patient is receptive to staff; he contracts for safety on the unit.

## 2016-10-04 NOTE — Tx Team (Signed)
Interdisciplinary Treatment and Diagnostic Plan Update  10/04/2016 Time of Session: 0930 Lucas Small MRN: [id number]  Principal Diagnosis: Substance Induced Mood Disorder  Secondary Diagnoses: Active Problems:   MDD (major depressive disorder)   Current Medications:  Current Facility-Administered Medications  Medication Dose Route Frequency Provider Last Rate Last Dose  . acetaminophen (TYLENOL) tablet 650 mg  650 mg Oral Q6H PRN Kerrie Buffalo, NP   650 mg at 10/04/16 0501  . alum & mag hydroxide-simeth (MAALOX/MYLANTA) 200-200-20 MG/5ML suspension 30 mL  30 mL Oral Q4H PRN Kerrie Buffalo, NP      . chlordiazePOXIDE (LIBRIUM) capsule 25 mg  25 mg Oral Q6H PRN Lindell Spar I, NP   25 mg at 10/03/16 2002  . chlordiazePOXIDE (LIBRIUM) capsule 25 mg  25 mg Oral QID Lindell Spar I, NP   25 mg at 10/04/16 0801   Followed by  . [START ON 10/05/2016] chlordiazePOXIDE (LIBRIUM) capsule 25 mg  25 mg Oral TID Encarnacion Slates, NP       Followed by  . [START ON 10/06/2016] chlordiazePOXIDE (LIBRIUM) capsule 25 mg  25 mg Oral BH-qamhs Lindell Spar I, NP       Followed by  . [START ON 10/07/2016] chlordiazePOXIDE (LIBRIUM) capsule 25 mg  25 mg Oral Daily Nwoko, Agnes I, NP      . escitalopram (LEXAPRO) tablet 20 mg  20 mg Oral Daily Kerrie Buffalo, NP   20 mg at 10/04/16 0801  . hydrOXYzine (ATARAX/VISTARIL) tablet 25 mg  25 mg Oral Q6H PRN Nwoko, Agnes I, NP      . insulin aspart (novoLOG) injection 0-15 Units  0-15 Units Subcutaneous TID WC Lindell Spar I, NP   2 Units at 10/04/16 [phone]  . insulin aspart (novoLOG) injection 3 Units  3 Units Subcutaneous TID WC Lindell Spar I, NP   3 Units at 10/04/16 [phone]  . loperamide (IMODIUM) capsule 2-4 mg  2-4 mg Oral PRN Lindell Spar I, NP      . metFORMIN (GLUCOPHAGE) tablet 500 mg  500 mg Oral BID WC Kerrie Buffalo, NP   500 mg at 10/04/16 0801  . multivitamin with minerals tablet 1 tablet  1 tablet Oral Daily Lindell Spar I, NP   1 tablet at 10/04/16  0800  . nicotine (NICODERM CQ - dosed in mg/24 hours) patch 21 mg  21 mg Transdermal Daily Izediuno, Laruth Bouchard, MD   21 mg at 10/04/16 0803  . ondansetron (ZOFRAN-ODT) disintegrating tablet 4 mg  4 mg Oral Q6H PRN Nwoko, Agnes I, NP      . thiamine (B-1) injection 100 mg  100 mg Intramuscular Once Nwoko, Herbert Pun I, NP      . thiamine (VITAMIN B-1) tablet 100 mg  100 mg Oral Daily Nwoko, Agnes I, NP   100 mg at 10/04/16 0801  . traZODone (DESYREL) tablet 100 mg  100 mg Oral QHS PRN Lindell Spar I, NP   100 mg at 10/03/16 2104   Facility-Administered Medications Ordered in Other Encounters  Medication Dose Route Frequency Provider Last Rate Last Dose  . folic acid (FOLVITE) tablet 1 mg  1 mg Oral Daily Rankin, Shuvon B, NP      . haloperidol (HALDOL) tablet 5 mg  5 mg Oral Q6H PRN Rankin, Shuvon B, NP      . magnesium hydroxide (MILK OF MAGNESIA) suspension 30 mL  30 mL Oral Daily PRN Rankin, Shuvon B, NP       PTA Medications: Prescriptions Prior to  Admission  Medication Sig Dispense Refill Last Dose  . aspirin EC 81 MG tablet Take 81 mg by mouth daily as needed.    Past Week at Unknown time  . escitalopram (LEXAPRO) 20 MG tablet Take 1 tablet (20 mg total) by mouth daily. 90 tablet 1 Past Week at Unknown time  . glucose blood (ONETOUCH VERIO) test strip Check Blood sugar twice daily. Dx:E11.9 100 each 12 Taking  . ibuprofen (ADVIL,MOTRIN) 200 MG tablet Take 200 mg by mouth as needed.   Past Week at Unknown time  . metFORMIN (GLUCOPHAGE) 500 MG tablet Take 1 tablet (500 mg total) by mouth 2 (two) times daily with a meal. 180 tablet 1 Past Week at Unknown time  . Multiple Vitamin (MULTIVITAMIN WITH MINERALS) TABS tablet Take 1 tablet by mouth daily.   Past Week at Unknown time  . NONFORMULARY OR COMPOUNDED ITEM Abdominal binder dx abd hernias 1 each 1 Taking  . ONETOUCH DELICA LANCETS FINE MISC Check Blood sugar twice daily. Dx:E11.9 100 each 12 Taking  . oxyCODONE-acetaminophen (ROXICET) 5-325  MG tablet Take 1 tablet by mouth every 8 (eight) hours as needed for severe pain. 20 tablet 0 10/02/2016 at Unknown time  . pantoprazole (PROTONIX) 40 MG tablet Take 1 tablet (40 mg total) by mouth 2 (two) times daily. Reported on 07/11/2015 (Patient taking differently: Take 40 mg by mouth 2 (two) times daily. ) 180 tablet 1 Past Week at Unknown time  . polyethylene glycol (MIRALAX / GLYCOLAX) packet Take 17 g by mouth daily as needed for mild constipation. Reported on 07/11/2015 14 each 1 Past Week at Unknown time  . rosuvastatin (CRESTOR) 20 MG tablet TAKE 1 TABLET(20 MG) BY MOUTH DAILY 90 tablet 0 Past Week at Unknown time  . traZODone (DESYREL) 50 MG tablet Take 1 tablet (50 mg total) by mouth at bedtime as needed for sleep. 30 tablet 3 Past Week at Unknown time    Patient Stressors: Financial difficulties Health problems Loss of friends, lover, and family members to death Substance abuse  Patient Strengths: Ability for insight Capable of independent living Motivation for treatment/growth Supportive family/friends  Treatment Modalities: Medication Management, Group therapy, Case management,  1 to 1 session with clinician, Psychoeducation, Recreational therapy.   Physician Treatment Plan for Primary Diagnosis: <principal problem not specified> Long Term Goal(s):     Short Term Goals:    Medication Management: Evaluate patient's response, side effects, and tolerance of medication regimen.  Therapeutic Interventions: 1 to 1 sessions, Unit Group sessions and Medication administration.  Evaluation of Outcomes: Progressing  Physician Treatment Plan for Secondary Diagnosis: Active Problems:   MDD (major depressive disorder)  Long Term Goal(s):     Short Term Goals:       Medication Management: Evaluate patient's response, side effects, and tolerance of medication regimen.  Therapeutic Interventions: 1 to 1 sessions, Unit Group sessions and Medication administration.  Evaluation of  Outcomes: Progressing   RN Treatment Plan for Primary Diagnosis: <principal problem not specified> Long Term Goal(s): Knowledge of disease and therapeutic regimen to maintain health will improve  Short Term Goals: Ability to remain free from injury will improve, Ability to verbalize feelings will improve and Ability to disclose and discuss suicidal ideas  Medication Management: RN will administer medications as ordered by provider, will assess and evaluate patient's response and provide education to patient for prescribed medication. RN will report any adverse and/or side effects to prescribing provider.  Therapeutic Interventions: 1 on 1 counseling sessions, Psychoeducation,  Medication administration, Evaluate responses to treatment, Monitor vital signs and CBGs as ordered, Perform/monitor CIWA, COWS, AIMS and Fall Risk screenings as ordered, Perform wound care treatments as ordered.  Evaluation of Outcomes: Not Met   LCSW Treatment Plan for Primary Diagnosis: <principal problem not specified> Long Term Goal(s): Safe transition to appropriate next level of care at discharge, Engage patient in therapeutic group addressing interpersonal concerns.  Short Term Goals: Engage patient in aftercare planning with referrals and resources, Facilitate patient progression through stages of change regarding substance use diagnoses and concerns and Identify triggers associated with mental health/substance abuse issues  Therapeutic Interventions: Assess for all discharge needs, 1 to 1 time with Social worker, Explore available resources and support systems, Assess for adequacy in community support network, Educate family and significant other(s) on suicide prevention, Complete Psychosocial Assessment, Interpersonal group therapy.  Evaluation of Outcomes: Not Met   Progress in Treatment: Attending groups: No. Participating in groups: No. Taking medication as prescribed: Yes. Toleration medication:  Yes. Family/Significant other contact made: No, will contact:  family member if patient consents Patient understands diagnosis: Yes. Discussing patient identified problems/goals with staff: Yes. Medical problems stabilized or resolved: Yes. Denies suicidal/homicidal ideation: No. Passive SI/Able to contract for safety on the unit.  Issues/concerns per patient self-inventory: No. Other: n/a   New problem(s) identified: Pt continues to present as irritable; demanding; med seeking.   New Short Term/Long Term Goal(s): detox; medication stabilization; elimination of SI thoughts and VH, and development of comprehensive mental wellness/sobriety plan. 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   Discharge Plan or Barriers: CSW assessing for appropriate referrals. Pt has had 3+ admissions to [facility] in the past with his last being 11/23/16 where he was referred to [facility] for outpatient mental health services.   Reason for Continuation of Hospitalization: Depression Medication stabilization Withdrawal symptoms  Estimated Length of Stay: 3-5 days   Attendees: Patient: 10/04/2016 8:47 AM  Physician: Dr. Sanjuana Letters MD 10/04/2016 8:47 AM  Nursing: Rogue Jury RN 10/04/2016 8:47 AM  RN Care Manager: Lars Pinks CM 10/04/2016 8:47 AM  Social Worker: Maxie Better, LCSW 10/04/2016 8:47 AM  Recreational Therapist: x 10/04/2016 8:47 AM  Other: Lindell Spar NP 10/04/2016 8:47 AM  Other:  10/04/2016 8:47 AM  Other: 10/04/2016 8:47 AM    Scribe for Treatment Team: [place], LCSW 10/04/2016 8:47 AM
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
## 2016-10-05 DIAGNOSIS — Z87891 Personal history of nicotine dependence: Secondary | ICD-10-CM

## 2016-10-05 DIAGNOSIS — F332 Major depressive disorder, recurrent severe without psychotic features: Secondary | ICD-10-CM | POA: Diagnosis present

## 2016-10-05 LAB — GLUCOSE, CAPILLARY
GLUCOSE-CAPILLARY: 161 mg/dL — AB (ref 65–99)
GLUCOSE-CAPILLARY: 205 mg/dL — AB (ref 65–99)
GLUCOSE-CAPILLARY: 185 mg/dL — AB (ref 65–99)
Glucose-Capillary: 198 mg/dL — ABNORMAL HIGH (ref 65–99)

## 2016-10-05 MED ORDER — SIMETHICONE 80 MG PO CHEW
80.0000 mg | CHEWABLE_TABLET | Freq: Four times a day (QID) | ORAL | Status: DC | PRN
  Administered 2016-10-05 – 2016-10-12 (×17): 80 mg via ORAL
  Filled 2016-10-05 (×23): qty 1

## 2016-10-05 NOTE — [facility] Group Notes (Signed)
[place] LCSW Group Therapy 10/05/2016 1:15pm  Type of Therapy: Group Therapy- Feelings Around Relapse and Recovery  Participation Level: Active   Participation Quality:  Appropriate  Affect:  Appropriate  Cognitive: Alert and Oriented   Insight:  Developing   Engagement in Therapy: Developing/Improving and Engaged   Modes of Intervention: Clarification, Confrontation, Discussion, Education, Exploration, Limit-setting, Orientation, Problem-solving, Rapport Building, Art therapist, Socialization and Support  Summary of Progress/Problems: The topic for today was feelings about relapse. The group discussed what relapse prevention is to them and identified triggers that they are on the path to relapse. Members also processed their feeling towards relapse and were able to relate to common experiences. Group also discussed coping skills that can be used for relapse prevention. Pt states that he doesn't have any supports or motivating factors in his life. Prior to making this statement pt was talking about his kids and how much joy that they bring him. CSW pointed out the contradictory statements but pt states that he isn't able to see his kids as much as he would like. Pt monopolized the discussion at several points and was difficult to redirect.    Therapeutic Modalities:   Cognitive Behavioral Therapy Solution-Focused Therapy Assertiveness Training Relapse Prevention Therapy    Georga Kaufmann, MSW, Latanya Presser [phone] 10/05/2016 3:14 PM

## 2016-10-05 NOTE — [facility] Group Notes (Signed)
[facility] Group Notes:  (Nursing/MHT/Case Management/Adjunct)  Date:  10/05/2016  Time:  11:19 AM  Type of Therapy:  Nurse Education  Participation Level:  Did Not Attend   Summary of Progress/Problems:  This group discussed relapse prevention, successful goal setting, and healthy coping skills.   Cheri Kearns 10/05/2016, 11:19 AM

## 2016-10-05 NOTE — Progress Notes (Signed)
Data. Patient denies SI/HI/AVH. Patient interacting well with staff and other patients. Affect has been bright through out shift. His interactions have been somewhat demanding. He has had excessive malodorous flatulence. Received gas X, with only minimal results. On his self assessment patient reports 9/10 for depression and hopelessness and 8/10 for anxiety. His goal for today is, "Would like to talk over my problems." Action. Emotional support and encouragement offered. Education provided on medication, indications and side effect. Q 15 minute checks done for safety. Response. Safety on the unit maintained through 15 minute checks.  Medications taken as prescribed. Attended groups. Remained calm and appropriate through out shift.

## 2016-10-05 NOTE — Progress Notes (Signed)
Pt reports his day has been "ok".  He states his current issue is gas and proceeded to explain his past surgeries and how they have affected his gastric functions.  Then he began to discuss the nature and odor of his gas.  He spent 30 minutes talking on this subject.  Pt was given Maalox for his symptoms and then encouraged to talk to the doctor or the NP about his concerns.  Pt states he still has passive suicidal thoughts at times, but contracts for safety.  He denies HI/AVH.  He has been in the dayroom most of the evening and did attend evening group.  Support and encouragement offered.  Discharge plans are in process.  Safety maintained with q15 minute checks.
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
## 2016-10-05 NOTE — Progress Notes (Signed)
[facility] MD Progress Note  10/05/2016 11:26 AM Lucas Small  MRN:  [id number] Subjective:  "I feel a little lethargic today but the racing thoughts are pretty much gone so I know it's a trade-off. I know this is part of it.I do feel less anxiety and depression. I have been having a lot of gas, though."  Objective: Pt seen and chart reviewed. Pt is alert/oriented x4, calm, cooperative, and appropriate to situation. Pt denies suicidal/homicidal ideation and psychosis and does not appear to be responding to internal stimuli. Pt continues to report some anxiety and voices concern about his ability to stop drinking alcohol, also citing a strong interest in inpatient rehab when leaving here. Pt reports that his history of gastric surgery has caused him to have severe flatulence. He is using simethicone which is basically all we have available at [facility].    Principal Problem: MDD (major depressive disorder), recurrent severe, without psychosis ([place]) Diagnosis:   Patient Active Problem List   Diagnosis Date Noted  . MDD (major depressive disorder), recurrent severe, without psychosis ([place]) [F33.2] 10/05/2016    Priority: High  . OSA (obstructive sleep apnea) [G47.33] 04/25/2016  . Back pain [M54.9] 12/15/2015  . GERD (gastroesophageal reflux disease) [K21.9] 12/15/2015  . DM (diabetes mellitus) type II uncontrolled, periph vascular disorder ([place]) [E11.51, E11.65] 12/15/2015  . Hyperlipidemia LDL goal <70 [E78.5] 12/15/2015  . Diverticulitis [K57.92] 03/05/2013  . Tobacco dependency [F17.200] 03/05/2013  . DOE (dyspnea on exertion) [R06.09] 03/05/2013  . Major depression, recurrent ([place]) [F33.9] 11/24/2012  . Snoring disorder 08/27/2011  . Substance induced mood disorder ([place]) [F19.94] 08/23/2011    Class: Acute  . Alcohol dependency ([place]) [F10.20] 08/23/2011    Class: Acute   Total Time spent with patient: 30 minutes  Past Psychiatric History: see H&P  Past Medical History:  Past Medical History:   Diagnosis Date  . Alcoholic ([place])   . CHF (congestive heart failure) ([place])   . Depression   . Diabetes mellitus without complication (HCC)    metformin  . Dyspnea    with exertion .  Marland Kitchen Dysrhythmia    while hospitalized in [place]  . History of kidney stones   . Hypertension   . MRSA (methicillin resistant Staphylococcus aureus)   . Multiple myeloma ([place]) 1990's  . Perforation bowel ([place])   . PNA (pneumonia)   . Renal disorder   . Renal failure   . Renal insufficiency   . Respiratory failure ([place])   . Sickle cell anemia (HCC)   . Sleep apnea    no CPAP machine    Past Surgical History:  Procedure Laterality Date  . ABDOMINAL SURGERY    . CARDIAC SURGERY    . HERNIA REPAIR    . ILEOSTOMY    . PERIPHERALLY INSERTED CENTRAL CATHETER INSERTION     Family History: History reviewed. No pertinent family history. Family Psychiatric  History: see H&P Social History:  History  Alcohol Use  . Yes    Comment: occasionally     History  Drug Use No    Comment: THC cocaine    Social History   Social History  . Marital status: Divorced    Spouse name: N/A  . Number of children: N/A  . Years of education: N/A   Occupational History  .      disabled   Social History Main Topics  . Smoking status: Former Smoker    Packs/day: 1.00    Years: 28.00    Types:  Cigarettes    Quit date: 08/28/2016  . Smokeless tobacco: Never Used  . Alcohol use Yes     Comment: occasionally  . Drug use: No     Comment: THC cocaine  . Sexual activity: Yes     Comment: Not asked   Other Topics Concern  . None   Social History Narrative   Exercise---  Walking some --  Not enough   Additional Social History:                         Sleep: Fair  Appetite:  Fair  Current Medications: Current Facility-Administered Medications  Medication Dose Route Frequency Provider Last Rate Last Dose  . acetaminophen (TYLENOL) tablet 650 mg  650 mg Oral Q6H PRN Kerrie Buffalo, NP    650 mg at 10/04/16 0501  . alum & mag hydroxide-simeth (MAALOX/MYLANTA) 200-200-20 MG/5ML suspension 30 mL  30 mL Oral Q4H PRN Kerrie Buffalo, NP   30 mL at 10/04/16 2100  . chlordiazePOXIDE (LIBRIUM) capsule 25 mg  25 mg Oral Q6H PRN Lindell Spar I, NP   25 mg at 10/03/16 2002  . chlordiazePOXIDE (LIBRIUM) capsule 25 mg  25 mg Oral TID Lindell Spar I, NP   25 mg at 10/05/16 0758   Followed by  . [START ON 10/06/2016] chlordiazePOXIDE (LIBRIUM) capsule 25 mg  25 mg Oral BH-qamhs Lindell Spar I, NP       Followed by  . [START ON 10/07/2016] chlordiazePOXIDE (LIBRIUM) capsule 25 mg  25 mg Oral Daily Lindell Spar I, NP      . DULoxetine (CYMBALTA) DR capsule 40 mg  40 mg Oral Daily Izediuno, Laruth Bouchard, MD   40 mg at 10/05/16 0801  . gabapentin (NEURONTIN) capsule 300 mg  300 mg Oral TID Artist Beach, MD   300 mg at 10/05/16 0801  . hydrOXYzine (ATARAX/VISTARIL) tablet 25 mg  25 mg Oral Q6H PRN Nwoko, Agnes I, NP      . insulin aspart (novoLOG) injection 0-15 Units  0-15 Units Subcutaneous TID WC Lindell Spar I, NP   3 Units at 10/05/16 0636  . insulin aspart (novoLOG) injection 3 Units  3 Units Subcutaneous TID WC Lindell Spar I, NP   3 Units at 10/05/16 [phone]  . loperamide (IMODIUM) capsule 2-4 mg  2-4 mg Oral PRN Lindell Spar I, NP   2 mg at 10/04/16 1036  . metFORMIN (GLUCOPHAGE) tablet 500 mg  500 mg Oral BID WC Kerrie Buffalo, NP   500 mg at 10/05/16 0758  . multivitamin with minerals tablet 1 tablet  1 tablet Oral Daily Lindell Spar I, NP   1 tablet at 10/05/16 0758  . nicotine (NICODERM CQ - dosed in mg/24 hours) patch 21 mg  21 mg Transdermal Daily Izediuno, Laruth Bouchard, MD   21 mg at 10/05/16 0758  . ondansetron (ZOFRAN-ODT) disintegrating tablet 4 mg  4 mg Oral Q6H PRN Lindell Spar I, NP   4 mg at 10/04/16 1036  . simethicone (MYLICON) chewable tablet 80 mg  80 mg Oral Q6H PRN Saveah Bahar C, FNP      . thiamine (B-1) injection 100 mg  100 mg Intramuscular Once Nwoko, Agnes I, NP       . thiamine (VITAMIN B-1) tablet 100 mg  100 mg Oral Daily Nwoko, Agnes I, NP   100 mg at 10/05/16 0758  . traZODone (DESYREL) tablet 100 mg  100 mg Oral QHS PRN Encarnacion Slates,  NP   100 mg at 10/04/16 2142   Facility-Administered Medications Ordered in Other Encounters  Medication Dose Route Frequency Provider Last Rate Last Dose  . folic acid (FOLVITE) tablet 1 mg  1 mg Oral Daily Rankin, Shuvon B, NP      . haloperidol (HALDOL) tablet 5 mg  5 mg Oral Q6H PRN Rankin, Shuvon B, NP      . magnesium hydroxide (MILK OF MAGNESIA) suspension 30 mL  30 mL Oral Daily PRN Rankin, Shuvon B, NP        Lab Results:  Results for orders placed or performed during the hospital encounter of 10/03/16 (from the past 48 hour(s))  Glucose, capillary     Status: Abnormal   Collection Time: 10/03/16  4:52 PM  Result Value Ref Range   Glucose-Capillary 195 (H) 65 - 99 mg/dL  Glucose, capillary     Status: Abnormal   Collection Time: 10/03/16  8:43 PM  Result Value Ref Range   Glucose-Capillary 149 (H) 65 - 99 mg/dL  Glucose, capillary     Status: Abnormal   Collection Time: 10/04/16  5:57 AM  Result Value Ref Range   Glucose-Capillary 134 (H) 65 - 99 mg/dL  Glucose, capillary     Status: Abnormal   Collection Time: 10/04/16 12:02 PM  Result Value Ref Range   Glucose-Capillary 133 (H) 65 - 99 mg/dL   Comment 1 Notify RN    Comment 2 Document in Chart   Glucose, capillary     Status: Abnormal   Collection Time: 10/04/16  5:18 PM  Result Value Ref Range   Glucose-Capillary 230 (H) 65 - 99 mg/dL  Glucose, capillary     Status: Abnormal   Collection Time: 10/04/16  8:43 PM  Result Value Ref Range   Glucose-Capillary 143 (H) 65 - 99 mg/dL   Comment 1 Notify RN    Comment 2 Document in Chart   Glucose, capillary     Status: Abnormal   Collection Time: 10/05/16  5:57 AM  Result Value Ref Range   Glucose-Capillary 161 (H) 65 - 99 mg/dL   Comment 1 Notify RN    Comment 2 Document in Chart     Blood  Alcohol level:  Lab Results  Component Value Date   ETH 246 (H) 10/02/2016   ETH <5 97/06/6376    Metabolic Disorder Labs: Lab Results  Component Value Date   HGBA1C 6.9 (H) 02/13/2016   No results found for: PROLACTIN Lab Results  Component Value Date   CHOL 158 02/13/2016   TRIG 143.0 02/13/2016   HDL 35.20 (L) 02/13/2016   CHOLHDL 4 02/13/2016   VLDL 28.6 02/13/2016   LDLCALC 94 02/13/2016   LDLCALC 165 (H) 12/05/2015    Physical Findings: AIMS: Facial and Oral Movements Muscles of Facial Expression: None, normal Lips and Perioral Area: None, normal Jaw: None, normal Tongue: None, normal,Extremity Movements Upper (arms, wrists, hands, fingers): None, normal Lower (legs, knees, ankles, toes): None, normal, Trunk Movements Neck, shoulders, hips: None, normal, Overall Severity Severity of abnormal movements (highest score from questions above): None, normal Incapacitation due to abnormal movements: None, normal Patient's awareness of abnormal movements (rate only patient's report): No Awareness, Dental Status Current problems with teeth and/or dentures?: No Does patient usually wear dentures?: No  CIWA:  CIWA-Ar Total: 1 COWS:     Musculoskeletal: Strength & Muscle Tone: within normal limits Gait & Station: normal Patient leans: N/A  Psychiatric Specialty Exam: Physical Exam  Review of  Systems  Gastrointestinal:       Severe flatulence   Psychiatric/Behavioral: Positive for depression and substance abuse. Negative for hallucinations and suicidal ideas. The patient is nervous/anxious and has insomnia.   All other systems reviewed and are negative.   Blood pressure 121/73, pulse 93, temperature 98.4 F (36.9 C), temperature source Oral, resp. rate 20, height 5' 8.5" (1.74 m), weight 95 kg (209 lb 8 oz).Body mass index is 31.39 kg/m.  General Appearance: Casual and Fairly Groomed  Eye Contact:  Fair  Speech:  Clear and Coherent and Normal Rate  Volume:   Normal  Mood:  Anxious and Depressed  Affect:  Appropriate, Congruent and Depressed  Thought Process:  Coherent, Goal Directed, Linear and Descriptions of Associations: Intact  Orientation:  Full (Time, Place, and Person)  Thought Content:  Focused on flatulence, going outside, depression, anxiety  Suicidal Thoughts:  No  Homicidal Thoughts:  No  Memory:  Immediate;   Fair Recent;   Fair Remote;   Fair  Judgement:  Fair  Insight:  Fair  Psychomotor Activity:  Normal  Concentration:  Concentration: Fair and Attention Span: Fair  Recall:  [employer] of Knowledge:  Fair  Language:  Fair  Akathisia:  No  Handed:    AIMS (if indicated):     Assets:  Communication Skills Desire for Improvement Resilience Social Support  ADL's:  Intact  Cognition:  WNL  Sleep:  Number of Hours: 6.75   Treatment Plan Summary: -Continue Librium/CIWA protocol -Continue Cymbalta 53m po daily for depression/pain -Continue nicotine pathc -Continue Metformin 507mpo bid for DM2 -Continue Neurontin 30082mo tid for anxiety/ETOH -Continue Trazodone 100m108m qhs prn insomnia -Continue Simethicone chewable tabs 80mg88m prn flatulence  WithrBenjamine Mola 10/05/2016, 11:26 AM

## 2016-10-05 NOTE — Plan of Care (Signed)
Problem: Activity: Goal: Interest or engagement in leisure activities will improve Outcome: Progressing Patient has been out of bed for most of the shift, with the exception of a morning nap.

## 2016-10-05 NOTE — Progress Notes (Signed)
Recreation Therapy Notes  Date: 10/05/16 Time: 0930 Location: [address] Dayroom  Group Topic: Stress Management  Goal Area(s) Addresses:  Patient will verbalize importance of using healthy stress management.  Patient will identify positive emotions associated with healthy stress management.   Intervention: Stress Management  Activity :  Progressive Muscle Relaxation.  LRT introduced the stress management technique of progressive muscle relaxation.  LRT read a script to allow patients to fully participate in the technique.  Patients were to follow along as the script was read to engage in the activity.  Education:  Stress Management, Discharge Planning.   Education Outcome: Acknowledges edcuation/In group clarification offered/Needs additional education  Clinical Observations/Feedback:  Pt did not attend group.    Victorino Sparrow, LRT/CTRS         Victorino Sparrow A 10/05/2016 10:48 AM

## 2016-10-06 LAB — GLUCOSE, CAPILLARY
GLUCOSE-CAPILLARY: 143 mg/dL — AB (ref 65–99)
GLUCOSE-CAPILLARY: 178 mg/dL — AB (ref 65–99)
Glucose-Capillary: 140 mg/dL — ABNORMAL HIGH (ref 65–99)
Glucose-Capillary: 203 mg/dL — ABNORMAL HIGH (ref 65–99)

## 2016-10-06 NOTE — Progress Notes (Signed)
Pt still having issues with flatulence and apologizes about the odor.  He tells Probation officer that he feels like the other patients are getting upset with him.  Writer assures pt that it is not his fault, and that meds are still available.  He is aware that mylicon was ordered for him, and he was given a dose early in the shift.  At med pass he was given Maalox before going to bed.  Pt states he is still depressed and frustrated about his meds.  He is anxious about getting his meds right.  He is pleasant and appropriate on the unit.  He makes his needs known to staff.  He denies SI/HI/AVH at this time.  Support and encouragement offered.  Discharge plans are in process.  Safety maintained with q15 minute checks.

## 2016-10-06 NOTE — BHH Group Notes (Signed)
[place] LCSW Group Therapy Note  10/06/2016  and  10:00 AM  Type of Therapy and Topic:  Group Therapy: Avoiding Self-Sabotaging and Enabling Behaviors  Participation Level:  Did Not Attend; despite overhead announcement and face to face invitation  Summary of Progress/Problems:  The main focus of today's process group was for the patient to identify ways in which they have in the past sabotaged their own recovery. Motivational Interviewing was utilized to identify motivation they may have for wanting to change.   Sheilah Pigeon, LCSW

## 2016-10-06 NOTE — Progress Notes (Signed)
Data. Patient denies SI/HI/AVH. Patient interacting well with staff and other patients. Gas is less this shift, in both odor and frequency. Affect has been bright and he has been expressing positive, hopeful comments. Action. Emotional support and encouragement offered. Education provided on medication, indications and side effect. Q 15 minute checks done for safety. Response. Safety on the unit maintained through 15 minute checks.  Medications taken as prescribed. Attended groups. Remained calm and appropriate through out shift.

## 2016-10-06 NOTE — Progress Notes (Signed)
Patient did not attend wrap up group. 

## 2016-10-06 NOTE — Plan of Care (Signed)
Problem: Activity: Goal: Interest or engagement in leisure activities will improve Outcome: Progressing Patient has been out of his room most of the day and has attended most of the unit activities.

## 2016-10-06 NOTE — BHH Group Notes (Signed)
Adult Psychoeducational Group Note  Date:  10/06/2016 Time:  1:10 AM  Group Topic/Focus:  Wrap-Up Group:   The focus of this group is to help patients review their daily goal of treatment and discuss progress on daily workbooks.  Participation Level:  Active  Participation Quality:  Appropriate  Affect:  Appropriate  Cognitive:  Appropriate  Insight: Appropriate  Engagement in Group:  Engaged  Modes of Intervention:  Rapport Building  Additional Comments:  Pt stated he had an okay day. Pt stated the highlight of his day was his ex-wife visiting him. They had a healthy conversation which made him feel better. The downside of his day was the medication. Pt stated that the medication makes him feel a little uncomfortable at times.   Lucas Small 10/06/2016, 1:10 AM

## 2016-10-07 DIAGNOSIS — F17210 Nicotine dependence, cigarettes, uncomplicated: Secondary | ICD-10-CM

## 2016-10-07 LAB — GLUCOSE, CAPILLARY
Glucose-Capillary: 142 mg/dL — ABNORMAL HIGH (ref 65–99)
Glucose-Capillary: 205 mg/dL — ABNORMAL HIGH (ref 65–99)
Glucose-Capillary: 258 mg/dL — ABNORMAL HIGH (ref 65–99)
Glucose-Capillary: 172 mg/dL — ABNORMAL HIGH (ref 65–99)

## 2016-10-07 NOTE — Progress Notes (Signed)
[facility] MD Progress Note  10/07/2016 9:41 AM Lucas Small  MRN:  [id number]   Subjective:  Patient reports "I am a little angry today, due to my medicare and disability."   Objective: Lucas Small is awake, alert and oriented *3.  Seen resting in bedroom.  Denies suicidal or homicidal ideation. Denies auditory or visual hallucination and does not appear to be responding to internal stimuli. However reports auditory hallucination in the past.  Patient reports he is medication compliant without mediation side effects.States his depression 7/10.  Reports my circumstances hasn't changed so I don't feel ready to discharge.  Reports good appetite and reports resting well. Support, encouragement and reassurance was provided.     Principal Problem: MDD (major depressive disorder), recurrent severe, without psychosis ([place]) Diagnosis:   Patient Active Problem List   Diagnosis Date Noted  . MDD (major depressive disorder), recurrent severe, without psychosis ([place]) [F33.2] 10/05/2016  . OSA (obstructive sleep apnea) [G47.33] 04/25/2016  . Back pain [M54.9] 12/15/2015  . GERD (gastroesophageal reflux disease) [K21.9] 12/15/2015  . DM (diabetes mellitus) type II uncontrolled, periph vascular disorder ([place]) [E11.51, E11.65] 12/15/2015  . Hyperlipidemia LDL goal <70 [E78.5] 12/15/2015  . Diverticulitis [K57.92] 03/05/2013  . Tobacco dependency [F17.200] 03/05/2013  . DOE (dyspnea on exertion) [R06.09] 03/05/2013  . Major depression, recurrent ([place]) [F33.9] 11/24/2012  . Snoring disorder 08/27/2011  . Substance induced mood disorder ([place]) [F19.94] 08/23/2011    Class: Acute  . Alcohol dependency ([place]) [F10.20] 08/23/2011    Class: Acute   Total Time spent with patient: 30 minutes  Past Psychiatric History: see H&P  Past Medical History:  Past Medical History:  Diagnosis Date  . Alcoholic ([place])   . CHF (congestive heart failure) ([place])   . Depression   . Diabetes mellitus without complication  (HCC)    metformin  . Dyspnea    with exertion .  Marland Kitchen Dysrhythmia    while hospitalized in [place]  . History of kidney stones   . Hypertension   . MRSA (methicillin resistant Staphylococcus aureus)   . Multiple myeloma ([place]) 1990's  . 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 PNA (pneumonia)   . Renal disorder   . Renal failure   . Renal insufficiency   . Respiratory failure ([place])   . Sickle cell anemia (HCC)   . Sleep apnea    no CPAP machine    Past Surgical History:  Procedure Laterality Date  . ABDOMINAL SURGERY    . CARDIAC SURGERY    . HERNIA REPAIR    . ILEOSTOMY    . PERIPHERALLY INSERTED CENTRAL CATHETER INSERTION     Family History: History reviewed. No pertinent family history. Family Psychiatric  History: see H&P Social History:  History  Alcohol Use  . Yes    Comment: occasionally     History  Drug Use No    Comment: THC cocaine    Social History   Social History  . Marital status: Divorced    Spouse name: N/A  . Number of children: N/A  . Years of education: N/A   Occupational History  .      disabled   Social History Main Topics  . Smoking status: Former Smoker    Packs/day: 1.00    Years: 28.00    Types: Cigarettes    Quit date: 08/28/2016  . Smokeless tobacco: Never Used  . Alcohol use Yes     Comment: occasionally  . Drug use: No  Comment: THC cocaine  . Sexual activity: Yes     Comment: Not asked   Other Topics Concern  . None   Social History Narrative   Exercise---  Walking some --  Not enough   Additional Social History:                         Sleep: Fair  Appetite:  Fair  Current Medications: Current Facility-Administered Medications  Medication Dose Route Frequency Provider Last Rate Last Dose  . acetaminophen (TYLENOL) tablet 650 mg  650 mg Oral Q6H PRN Kerrie Buffalo, NP   650 mg at 10/06/16 0826  . alum & mag hydroxide-simeth (MAALOX/MYLANTA) 200-200-20 MG/5ML suspension 30 mL  30 mL Oral Q4H PRN Kerrie Buffalo, NP   30 mL at 10/05/16 0000  . chlordiazePOXIDE (LIBRIUM) capsule 25 mg  25 mg Oral Daily Lindell Spar I, NP      . DULoxetine (CYMBALTA) DR capsule 40 mg  40 mg Oral Daily Izediuno, Laruth Bouchard, MD   40 mg at 10/06/16 0819  . gabapentin (NEURONTIN) capsule 300 mg  300 mg Oral TID Artist Beach, MD   300 mg at 10/06/16 1732  . insulin aspart (novoLOG) injection 0-15 Units  0-15 Units Subcutaneous TID WC Lindell Spar I, NP   2 Units at 10/07/16 [phone]  . insulin aspart (novoLOG) injection 3 Units  3 Units Subcutaneous TID WC Lindell Spar I, NP   3 Units at 10/07/16 [phone]  . metFORMIN (GLUCOPHAGE) tablet 500 mg  500 mg Oral BID WC Kerrie Buffalo, NP   500 mg at 10/06/16 1732  . multivitamin with minerals tablet 1 tablet  1 tablet Oral Daily Lindell Spar I, NP   1 tablet at 10/06/16 0820  . nicotine (NICODERM CQ - dosed in mg/24 hours) patch 21 mg  21 mg Transdermal Daily Izediuno, Laruth Bouchard, MD   21 mg at 10/06/16 0820  . simethicone (MYLICON) chewable tablet 80 mg  80 mg Oral Q6H PRN Benjamine Mola, FNP   80 mg at 10/07/16 0973  . thiamine (B-1) injection 100 mg  100 mg Intramuscular Once Nwoko, Herbert Pun I, NP      . thiamine (VITAMIN B-1) tablet 100 mg  100 mg Oral Daily Nwoko, Agnes I, NP   100 mg at 10/06/16 0820  . traZODone (DESYREL) tablet 100 mg  100 mg Oral QHS PRN Lindell Spar I, NP   100 mg at 10/06/16 2154   Facility-Administered Medications Ordered in Other Encounters  Medication Dose Route Frequency Provider Last Rate Last Dose  . folic acid (FOLVITE) tablet 1 mg  1 mg Oral Daily Rankin, Shuvon B, NP      . haloperidol (HALDOL) tablet 5 mg  5 mg Oral Q6H PRN Rankin, Shuvon B, NP      . magnesium hydroxide (MILK OF MAGNESIA) suspension 30 mL  30 mL Oral Daily PRN Rankin, Shuvon B, NP        Lab Results:  Results for orders placed or performed during the hospital encounter of 10/03/16 (from the past 48 hour(s))  Glucose, capillary     Status: Abnormal   Collection Time:  10/05/16 12:13 PM  Result Value Ref Range   Glucose-Capillary 198 (H) 65 - 99 mg/dL  Glucose, capillary     Status: Abnormal   Collection Time: 10/05/16  5:20 PM  Result Value Ref Range   Glucose-Capillary 205 (H) 65 - 99 mg/dL  Glucose, capillary  Status: Abnormal   Collection Time: 10/05/16 10:03 PM  Result Value Ref Range   Glucose-Capillary 185 (H) 65 - 99 mg/dL  Glucose, capillary     Status: Abnormal   Collection Time: 10/06/16  5:56 AM  Result Value Ref Range   Glucose-Capillary 143 (H) 65 - 99 mg/dL  Glucose, capillary     Status: Abnormal   Collection Time: 10/06/16 11:33 AM  Result Value Ref Range   Glucose-Capillary 140 (H) 65 - 99 mg/dL  Glucose, capillary     Status: Abnormal   Collection Time: 10/06/16  5:15 PM  Result Value Ref Range   Glucose-Capillary 203 (H) 65 - 99 mg/dL  Glucose, capillary     Status: Abnormal   Collection Time: 10/06/16  8:59 PM  Result Value Ref Range   Glucose-Capillary 178 (H) 65 - 99 mg/dL   Comment 1 Notify RN   Glucose, capillary     Status: Abnormal   Collection Time: 10/07/16  5:51 AM  Result Value Ref Range   Glucose-Capillary 142 (H) 65 - 99 mg/dL    Blood Alcohol level:  Lab Results  Component Value Date   ETH 246 (H) 10/02/2016   ETH <5 94/17/4081    Metabolic Disorder Labs: Lab Results  Component Value Date   HGBA1C 6.9 (H) 02/13/2016   No results found for: PROLACTIN Lab Results  Component Value Date   CHOL 158 02/13/2016   TRIG 143.0 02/13/2016   HDL 35.20 (L) 02/13/2016   CHOLHDL 4 02/13/2016   VLDL 28.6 02/13/2016   LDLCALC 94 02/13/2016   LDLCALC 165 (H) 12/05/2015    Physical Findings: AIMS: Facial and Oral Movements Muscles of Facial Expression: None, normal Lips and Perioral Area: None, normal Jaw: None, normal Tongue: None, normal,Extremity Movements Upper (arms, wrists, hands, fingers): None, normal Lower (legs, knees, ankles, toes): None, normal, Trunk Movements Neck, shoulders, hips:  None, normal, Overall Severity Severity of abnormal movements (highest score from questions above): None, normal Incapacitation due to abnormal movements: None, normal Patient's awareness of abnormal movements (rate only patient's report): No Awareness, Dental Status Current problems with teeth and/or dentures?: No Does patient usually wear dentures?: No  CIWA:  CIWA-Ar Total: 3 COWS:     Musculoskeletal: Strength & Muscle Tone: within normal limits Gait & Station: normal Patient leans: N/A  Psychiatric Specialty Exam: Physical Exam  Vitals reviewed. Constitutional: He is oriented to person, place, and time. He appears well-developed.  Cardiovascular: Normal rate.   Neurological: He is alert and oriented to person, place, and time.  Psychiatric: He has a normal mood and affect. His behavior is normal.    Review of Systems  Gastrointestinal:       Severe flatulence   Psychiatric/Behavioral: Positive for depression and substance abuse. Negative for hallucinations and suicidal ideas. The patient is nervous/anxious and has insomnia.   All other systems reviewed and are negative.   Blood pressure 106/76, pulse (!) 103, temperature 97.7 F (36.5 C), temperature source Oral, resp. rate 18, height 5' 8.5" (1.74 m), weight 95 kg (209 lb 8 oz).Body mass index is 31.39 kg/m.  General Appearance: Casual  Eye Contact:  Fair  Speech:  Clear and Coherent and Normal Rate  Volume:  Normal  Mood:  Anxious and Depressed  Affect:  Appropriate, Congruent and Depressed  Thought Process:  Coherent, Goal Directed, Linear and Descriptions of Associations: Intact  Orientation:  Full (Time, Place, and Person)  Thought Content:  Focused on flatulence, going outside, depression, anxiety  Suicidal Thoughts:  No  Homicidal Thoughts:  No  Memory:  Immediate;   Fair Recent;   Fair Remote;   Fair  Judgement:  Fair  Insight:  Fair  Psychomotor Activity:  Normal  Concentration:  Concentration: Fair and  Attention Span: Fair  Recall:  [employer] of Knowledge:  Fair  Language:  Fair  Akathisia:  No  Handed:    AIMS (if indicated):     Assets:  Communication Skills Desire for Improvement Resilience Social Support  ADL's:  Intact  Cognition:  WNL  Sleep:  Number of Hours: 5.25     I agree with current treatment plan on 10/07/2016, Patient seen face-to-face for psychiatric evaluation follow-up, chart reviewed. Reviewed the information documented and agree with the treatment plan.  Treatment Plan Summary:  Continue with current treatment plan listed below expect 10/07/2016 where noted  -Continue Librium/CIWA protocol -Continue Cymbalta '40mg'$  po daily for depression/pain -Continue nicotine pathc -Continue Metformin '500mg'$  po bid for DM2 -Continue Neurontin '300mg'$  po tid for anxiety/ETOH -Continue Trazodone '100mg'$  po qhs prn insomnia -Continue Simethicone chewable tabs '80mg'$  q6h prn flatulence  Derrill [place], NP 10/07/2016, 9:41 AM

## 2016-10-07 NOTE — Plan of Care (Signed)
Problem: Safety: Goal: Ability to disclose and discuss suicidal ideas will improve Outcome: Progressing Pt endorses SI to Medical laboratory scientific officer.  He verbally contracts for safety.

## 2016-10-07 NOTE — Progress Notes (Addendum)
D: Pt was in the dayroom upon initial approach.  Pt presents with depressed affect and mood.  He reports his day "could be better."  Pt reports he had a good visit with his ex-wife and her girlfriend earlier.  His goal is to "talk to the social worker and the doctor tomorrow."  Pt reports SI, denies plan at this time.  He verbally contracts for safety.  Denies HI, denies hallucinations, reports chronic pain of 8/10.  Pt has been visible in milieu interacting with peers and staff appropriately.  Pt attended evening group.    A: Introduced self to pt.  Actively listened to pt and offered support and encouragement.  PRN medication administered for pain, sleep, and flatulence.  Q15 minute safety checks maintained.    R: Pt is safe on the unit.  Pt is compliant with medications.  Pt verbally contracts for safety.  Will continue to monitor and assess.

## 2016-10-07 NOTE — BHH Group Notes (Signed)
[place] Group Notes:  (Nursing/MHT/Case Management/Adjunct)  Date:  10/07/2016  Time:  11:32 PM  Type of Therapy:  wrap-up  Participation Level:  Active  Participation Quality:  Attentive  Affect:  Anxious and Irritable  Cognitive:  Alert and Appropriate  Insight:  Appropriate  Engagement in Group:  Engaged  Modes of Intervention:  Discussion  Summary of Progress/Problems: pt stated he had good day, but was triggered due to no coffee earlier in the am  [place], [place] 10/07/2016, 11:32 PM

## 2016-10-07 NOTE — Progress Notes (Signed)
Nursing Progress Note: 7p-7a D: Pt currently presents with a sad/depressed affect and behavior. Pt states "I just get into these bouts of depression and make hasty decisions." Interacting appropriately with milieu. Pt reports good sleep with current medication regimen.   A: Pt provided with medications per providers orders. Pt's labs and vitals were monitored throughout the night. Pt supported emotionally and encouraged to express concerns and questions. Pt educated on medications.  R: Pt's safety ensured with 15 minute and environmental checks. Pt currently denies SI/HI/Self Harm and AVH. Pt verbally contracts to seek staff if SI/HI or A/VH occurs and to consult with staff before acting on any harmful thoughts. Will continue to monitor.

## 2016-10-07 NOTE — Progress Notes (Signed)
Data. Patient denies SI/HI/AVH. Patient interacting well with staff and other patients. Patient refuse to complete his self assessment this shift. Action. Emotional support and encouragement offered. Education provided on medication, indications and side effect. Q 15 minute checks done for safety. Response. Safety on the unit maintained through 15 minute checks.  Medications taken as prescribed. Attended groups. Remained calm and appropriate through out shift.

## 2016-10-07 NOTE — BHH Group Notes (Signed)
[place] LCSW Group Therapy  10/07/2016  10-11 AM  Type of Therapy:  Group Therapy  Participation Level:  Active  Participation Quality:  Monopolizing and Sharing  Affect:  Anxious  Cognitive:  Alert  Insight:  Limited  Engagement in Therapy:  Limited  Modes of Intervention:  Discussion, Exploration, Rapport Building, Socialization and Support  Summary of Progress/Problems: Topic for today was thoughts and feelings regarding discharge. We discussed fears of upcoming changes including judgements, expectations and stigma of mental health issues. We then discussed supports: what constitutes a supportive framework, identification of supports and what to do when others are not supportive. Patient processed his greatest challenges which had to do with health concerns which he inappropriately shared in great detail. Pt did not respond when redirected.   Sheilah Pigeon, LCSW

## 2016-10-08 LAB — GLUCOSE, CAPILLARY
GLUCOSE-CAPILLARY: 181 mg/dL — AB (ref 65–99)
Glucose-Capillary: 151 mg/dL — ABNORMAL HIGH (ref 65–99)
Glucose-Capillary: 208 mg/dL — ABNORMAL HIGH (ref 65–99)
Glucose-Capillary: 181 mg/dL — ABNORMAL HIGH (ref 65–99)

## 2016-10-08 NOTE — BHH Group Notes (Signed)
[place] LCSW Group Therapy  10/08/2016 1:15pm  Type of Therapy:  Group Therapy vercoming Obstacles  Participation Level:  Active  Participation Quality:  Appropriate   Affect:  Appropriate  Cognitive:  Appropriate and Oriented  Insight:  Developing/Improving and Improving  Engagement in Therapy:  Improving  Modes of Intervention:  Discussion, Exploration, Problem-solving and Support  Description of Group:   In this group patients will be encouraged to explore what they see as obstacles to their own wellness and recovery. They will be guided to discuss their thoughts, feelings, and behaviors related to these obstacles. The group will process together ways to cope with barriers, with attention given to specific choices patients can make. Each patient will be challenged to identify changes they are motivated to make in order to overcome their obstacles. This group will be process-oriented, with patients participating in exploration of their own experiences as well as giving and receiving support and challenge from other group members.  Summary of Patient Progress: Pt primarily focuses on his disability income issues as the obstacle he is facing. Pt has difficulty identifying steps to take in order to overcome this obstacle.    Therapeutic Modalities:   Cognitive Behavioral Therapy Solution Focused Therapy Motivational Interviewing Relapse Prevention Therapy   Adriana Reams, LCSW 10/08/2016 2:33 PM

## 2016-10-08 NOTE — Progress Notes (Signed)
Recreation Therapy Notes  Date: 10/08/16 Time: 0930 Location: [address] Dayroom  Group Topic: Stress Management  Goal Area(s) Addresses:  Patient will verbalize importance of using healthy stress management.  Patient will identify positive emotions associated with healthy stress management.   Intervention: Stress Management  Activity :  Guided Imagery.  LRT introduced the stress management technique of guided imagery.  LRT read a script to allow patients to engage in the technique.  Patients were to follow along as LRT read the script to fully participate in the technique.  Education:  Stress Management, Discharge Planning.   Education Outcome: Acknowledges edcuation/In group clarification offered/Needs additional education  Clinical Observations/Feedback: Pt did not attend group.    Victorino Sparrow, LRT/CTRS         Victorino Sparrow A 10/08/2016 12:37 PM

## 2016-10-08 NOTE — BHH Group Notes (Signed)
[facility] LCSW Aftercare Discharge Planning Group Note  10/08/2016 8:45 AM  Pt did not attend, declined invitation.   Adriana Reams, LCSW 10/08/2016 9:52 AM

## 2016-10-08 NOTE — Plan of Care (Signed)
Problem: Education: Goal: Utilization of techniques to improve thought processes will improve Outcome: Progressing Nurse discussed depression/anxiety/coping skills with patient.    

## 2016-10-08 NOTE — Progress Notes (Signed)
D:  Patient's self inventory sheet, patient sleeps good, sleep medication helpful.  Fair appetite, low energy level, poor concentration.  Rated depression and hopeless 8, anxiety 7.  Denied withdrawals.  Checked tremors, sedation, diarrhea, cravings, cramping, agitation.  Denied SI.  Physical problems, pain, dizzy.  Physical pain, worst pain #8 in past 24 hours, no pain medication.  Goal is seek help for disability.  Plans to talk to SW.  No discharge plans. A:  Medications administered per MD orders.  Emotional support and encouragement given patient. R:  Denied SI and HI, contracts for safety.  Denied A/V hallucination.  Safety maintained with 15 minute checks.  Patient stated he felt dizzy this morning and has been using walker this morning.  Patient requested his bed to be made up by staff today, that his clothes to be washed asap, and that he wanted coffee to be made for him before the scheduled time.   Patient stated he may need a new abdominal binder and will discuss with MD.

## 2016-10-08 NOTE — Progress Notes (Signed)
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 He states that he had a conflict with one of the nurse techs but did not go into further detail. He also mentioned that his scrubs tore on him causing him much embarrassment. Finally, the patient verbalized that he felt shaky and weak.  Archie Balboa S 10/08/2016, 10:27 PM

## 2016-10-08 NOTE — Progress Notes (Signed)
[facility] MD Progress Note  10/08/2016 5:04 PM ELBIE STATZER  MRN:  [id number]   Subjective: patient reports lingering depression, anxiety, and vague sense of irritability. States " I was feeling better over the weekend and yesterday, but today I am feeling more depressed, I don't know why". Denies any active  suicidal ideations .Contracts for safety on the unit. Somatically focused. Reports history of chronic pain, multiple surgeries ( mainly abdominal) over the last few years, and states he still needs " two more surgeries to fix some hernia and then I should be better". Does not endorse medication side effects.  Objective: I have discussed case with treatment team and have met with patient. 51 year old male , admitted 5/10. History of alcohol use disorder ( admission BAL 246 ) , depression, chronic pain, history of multiple abdominal surgeries ( which he attributes to initial complication of diverticulitis, which eventually resulted in sepsis). Today reports ongoing depression, anxiety, but denies any active SI, and is future oriented. As reviewed with staff, has presented irritable, demanding at times, focused on issues such as coffee availability. Patient does acknowledge feeling more irritable today, but is not presenting with an overtly irritable or expansive affect at this time, behavior is calm, not agitated, and there are no symptoms described or noted suggestive of mania. Visible on unit, going to some groups . No medication side effects.    Principal Problem: MDD (major depressive disorder), recurrent severe, without psychosis ([place]) Diagnosis:   Patient Active Problem List   Diagnosis Date Noted  . MDD (major depressive disorder), recurrent severe, without psychosis ([place]) [F33.2] 10/05/2016  . OSA (obstructive sleep apnea) [G47.33] 04/25/2016  . Back pain [M54.9] 12/15/2015  . GERD (gastroesophageal reflux disease) [K21.9] 12/15/2015  . DM (diabetes mellitus) type II uncontrolled,  periph vascular disorder ([place]) [E11.51, E11.65] 12/15/2015  . Hyperlipidemia LDL goal <70 [E78.5] 12/15/2015  . Diverticulitis [K57.92] 03/05/2013  . Tobacco dependency [F17.200] 03/05/2013  . DOE (dyspnea on exertion) [R06.09] 03/05/2013  . Major depression, recurrent ([place]) [F33.9] 11/24/2012  . Snoring disorder 08/27/2011  . Substance induced mood disorder ([place]) [F19.94] 08/23/2011    Class: Acute  . Alcohol dependency ([place]) [F10.20] 08/23/2011    Class: Acute   Total Time spent with patient: 20 minutes  Past Psychiatric History: see H&P  Past Medical History:  Past Medical History:  Diagnosis Date  . Alcoholic ([place])   . CHF (congestive heart failure) ([place])   . Depression   . Diabetes mellitus without complication (HCC)    metformin  . Dyspnea    with exertion .  Marland Kitchen Dysrhythmia    while hospitalized in [place]  . History of kidney stones   . Hypertension   . MRSA (methicillin resistant Staphylococcus aureus)   . Multiple myeloma ([place]) 1990's  . 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 PNA (pneumonia)   . Renal disorder   . Renal failure   . Renal insufficiency   . Respiratory failure ([place])   . Sickle cell anemia (HCC)   . Sleep apnea    no CPAP machine    Past Surgical History:  Procedure Laterality Date  . ABDOMINAL SURGERY    . CARDIAC SURGERY    . HERNIA REPAIR    . ILEOSTOMY    . PERIPHERALLY INSERTED CENTRAL CATHETER INSERTION     Family History: History reviewed. No pertinent family history. Family Psychiatric  History: see H&P Social History:  History  Alcohol Use  . Yes    Comment:  occasionally     History  Drug Use No    Comment: THC cocaine    Social History   Social History  . Marital status: Divorced    Spouse name: N/A  . Number of children: N/A  . Years of education: N/A   Occupational History  .      disabled   Social History Main Topics  . Smoking status: Former Smoker    Packs/day: 1.00    Years: 28.00    Types: Cigarettes    Quit  date: 08/28/2016  . Smokeless tobacco: Never Used  . Alcohol use Yes     Comment: occasionally  . Drug use: No     Comment: THC cocaine  . Sexual activity: Yes     Comment: Not asked   Other Topics Concern  . None   Social History Narrative   Exercise---  Walking some --  Not enough   Additional Social History:   Sleep: Fair  Appetite:  Fair- improving   Current Medications: Current Facility-Administered Medications  Medication Dose Route Frequency Provider Last Rate Last Dose  . acetaminophen (TYLENOL) tablet 650 mg  650 mg Oral Q6H PRN Adonis Brook, NP   650 mg at 10/07/16 2004  . alum & mag hydroxide-simeth (MAALOX/MYLANTA) 200-200-20 MG/5ML suspension 30 mL  30 mL Oral Q4H PRN Adonis Brook, NP   30 mL at 10/05/16 0000  . DULoxetine (CYMBALTA) DR capsule 40 mg  40 mg Oral Daily Izediuno, Delight Ovens, MD   40 mg at 10/08/16 0833  . gabapentin (NEURONTIN) capsule 300 mg  300 mg Oral TID Georgiann Cocker, MD   300 mg at 10/08/16 1624  . insulin aspart (novoLOG) injection 0-15 Units  0-15 Units Subcutaneous TID WC Armandina Stammer I, NP   5 Units at 10/08/16 1651  . insulin aspart (novoLOG) injection 3 Units  3 Units Subcutaneous TID WC Armandina Stammer I, NP   3 Units at 10/08/16 1652  . metFORMIN (GLUCOPHAGE) tablet 500 mg  500 mg Oral BID WC Adonis Brook, NP   500 mg at 10/08/16 1653  . multivitamin with minerals tablet 1 tablet  1 tablet Oral Daily Armandina Stammer I, NP   1 tablet at 10/08/16 0831  . nicotine (NICODERM CQ - dosed in mg/24 hours) patch 21 mg  21 mg Transdermal Daily Izediuno, Delight Ovens, MD   21 mg at 10/08/16 0832  . simethicone (MYLICON) chewable tablet 80 mg  80 mg Oral Q6H PRN Beau Fanny, FNP   80 mg at 10/08/16 1511  . thiamine (B-1) injection 100 mg  100 mg Intramuscular Once Nwoko, Nicole Kindred I, NP      . thiamine (VITAMIN B-1) tablet 100 mg  100 mg Oral Daily Nwoko, Agnes I, NP   100 mg at 10/08/16 0831  . traZODone (DESYREL) tablet 100 mg  100 mg Oral QHS  PRN Armandina Stammer I, NP   100 mg at 10/07/16 2131   Facility-Administered Medications Ordered in Other Encounters  Medication Dose Route Frequency Provider Last Rate Last Dose  . folic acid (FOLVITE) tablet 1 mg  1 mg Oral Daily Rankin, Shuvon B, NP      . haloperidol (HALDOL) tablet 5 mg  5 mg Oral Q6H PRN Rankin, Shuvon B, NP      . magnesium hydroxide (MILK OF MAGNESIA) suspension 30 mL  30 mL Oral Daily PRN Rankin, Shuvon B, NP        Lab Results:  Results for orders  placed or performed during the hospital encounter of 10/03/16 (from the past 48 hour(s))  Glucose, capillary     Status: Abnormal   Collection Time: 10/06/16  5:15 PM  Result Value Ref Range   Glucose-Capillary 203 (H) 65 - 99 mg/dL  Glucose, capillary     Status: Abnormal   Collection Time: 10/06/16  8:59 PM  Result Value Ref Range   Glucose-Capillary 178 (H) 65 - 99 mg/dL   Comment 1 Notify RN   Glucose, capillary     Status: Abnormal   Collection Time: 10/07/16  5:51 AM  Result Value Ref Range   Glucose-Capillary 142 (H) 65 - 99 mg/dL  Glucose, capillary     Status: Abnormal   Collection Time: 10/07/16 12:15 PM  Result Value Ref Range   Glucose-Capillary 205 (H) 65 - 99 mg/dL   Comment 1 Notify RN   Glucose, capillary     Status: Abnormal   Collection Time: 10/07/16  5:02 PM  Result Value Ref Range   Glucose-Capillary 258 (H) 65 - 99 mg/dL  Glucose, capillary     Status: Abnormal   Collection Time: 10/07/16  8:54 PM  Result Value Ref Range   Glucose-Capillary 172 (H) 65 - 99 mg/dL  Glucose, capillary     Status: Abnormal   Collection Time: 10/08/16  6:04 AM  Result Value Ref Range   Glucose-Capillary 151 (H) 65 - 99 mg/dL   Comment 1 Notify RN    Comment 2 Document in Chart   Glucose, capillary     Status: Abnormal   Collection Time: 10/08/16 12:23 PM  Result Value Ref Range   Glucose-Capillary 181 (H) 65 - 99 mg/dL  Glucose, capillary     Status: Abnormal   Collection Time: 10/08/16  4:47 PM   Result Value Ref Range   Glucose-Capillary 208 (H) 65 - 99 mg/dL   Comment 1 Notify RN    Comment 2 Document in Chart     Blood Alcohol level:  Lab Results  Component Value Date   ETH 246 (H) 10/02/2016   ETH <5 85/88/5027    Metabolic Disorder Labs: Lab Results  Component Value Date   HGBA1C 6.9 (H) 02/13/2016   No results found for: PROLACTIN Lab Results  Component Value Date   CHOL 158 02/13/2016   TRIG 143.0 02/13/2016   HDL 35.20 (L) 02/13/2016   CHOLHDL 4 02/13/2016   VLDL 28.6 02/13/2016   LDLCALC 94 02/13/2016   LDLCALC 165 (H) 12/05/2015    Physical Findings: AIMS: Facial and Oral Movements Muscles of Facial Expression: None, normal Lips and Perioral Area: None, normal Jaw: None, normal Tongue: None, normal,Extremity Movements Upper (arms, wrists, hands, fingers): None, normal Lower (legs, knees, ankles, toes): None, normal, Trunk Movements Neck, shoulders, hips: None, normal, Overall Severity Severity of abnormal movements (highest score from questions above): None, normal Incapacitation due to abnormal movements: None, normal Patient's awareness of abnormal movements (rate only patient's report): No Awareness, Dental Status Current problems with teeth and/or dentures?: No Does patient usually wear dentures?: No  CIWA:  CIWA-Ar Total: 1 COWS:  COWS Total Score: 3  Musculoskeletal: Strength & Muscle Tone: within normal limits Gait & Station: normal Patient leans: N/A  Psychiatric Specialty Exam: Physical Exam  Vitals reviewed. Constitutional: He is oriented to person, place, and time. He appears well-developed.  Cardiovascular: Normal rate.   Neurological: He is alert and oriented to person, place, and time.  Psychiatric: He has a normal mood and affect. His  behavior is normal.      Psychiatric Specialty Exam: Physical Exam  Vitals reviewed. Constitutional: He is oriented to person, place, and time. He appears well-developed.   Cardiovascular: Normal rate.   Neurological: He is alert and oriented to person, place, and time.  Psychiatric: He has a normal mood and affect. His behavior is normal.    Review of Systems  Gastrointestinal:       Severe flatulence   Psychiatric/Behavioral: Positive for depression and substance abuse. Negative for hallucinations and suicidal ideas. The patient is nervous/anxious and has insomnia.   All other systems reviewed and are negative. Reports flatulence, chronic pain issues   Blood pressure 110/72, pulse (!) 101, temperature 97.9 F (36.6 C), temperature source Oral, resp. rate 16, height 5' 8.5" (1.74 m), weight 95 kg (209 lb 8 oz).Body mass index is 31.39 kg/m.  General Appearance: Fairly Groomed  Eye Contact:  Good  Speech:  Normal Rate  Volume:  Decreased  Mood:  Depressed  Affect:  constricted but reactive, improves as session progresses   Thought Process:  Linear and Descriptions of Associations: Intact  Orientation:  Other:  fully alert and attentive   Thought Content:  No hallucinations, no delusions, somatically focused   Suicidal Thoughts:  No denies suicidal plan or intention , contracts for safety on unit, denies homicidal or violent ideations   Homicidal Thoughts:  No  Memory:  Recent and remote grossly intact   Judgement:  Fair  Insight:  Fair  Psychomotor Activity:  decreased- no current tremors, diaphoresis or restlessness   Concentration:  Concentration: Good and Attention Span: Good  Recall:  Good  Fund of Knowledge:  Good  Language:  Good  Akathisia:  Negative  Handed:    AIMS (if indicated):     Assets:  Desire for Improvement Resilience  ADL's:  Intact  Cognition:  WNL  Sleep:  Number of Hours: 6.75     Review of Systems  Gastrointestinal:       Severe flatulence   Psychiatric/Behavioral: Positive for depression and substance abuse. Negative for hallucinations and suicidal ideas. The patient is nervous/anxious and has insomnia.   All  other systems reviewed and are negative.    Assessment- patient is reporting ongoing depression, anxiety, and vague irritability.Presents somatically focused but does not appear to be in any acute distress or discomfort . Currently not presenting with symptoms of WDL . Denies active SI, contracts for safety on unit. Thus far tolerating Cymbalta /Neurontin well.  Treatment Plan Summary:  Treatment plan reviewed as below today 5/14.  Continue to encourage group and milieu participation Continue to encourage efforts to maintain sobriety and work on relapse prevention  -Continue Cymbalta '40mg'$  po daily for depression/pain -Continue Neurontin '300mg'$  po tid for anxiety/ETOH -Continue Trazodone '100mg'$  po qhs prn insomnia -Continue Simethicone chewable tabs '80mg'$  q6h prn flatulence -Treatment team working on disposition [place], MD 10/08/2016, 5:04 PM    Patient ID: [name], male   DOB: 03-28-1966, 51 y.o.   MRN: [id number]

## 2016-10-09 LAB — GLUCOSE, CAPILLARY
GLUCOSE-CAPILLARY: 146 mg/dL — AB (ref 65–99)
GLUCOSE-CAPILLARY: 225 mg/dL — AB (ref 65–99)
Glucose-Capillary: 227 mg/dL — ABNORMAL HIGH (ref 65–99)
Glucose-Capillary: 242 mg/dL — ABNORMAL HIGH (ref 65–99)

## 2016-10-09 MED ORDER — LOPERAMIDE HCL 2 MG PO CAPS: 2.0000 mg | ORAL_CAPSULE | ORAL | Status: DC | PRN

## 2016-10-09 NOTE — Treatment Plan (Signed)
Discussed case with Dr. Parke Poisson regarding increased loose stools and flatulence. No recent abx use. Is on metformin, duration of use unknown. No recent documented hx of pancreatitis. Vitals reviewed. Afebrile, normotensive, no recent leukocytosis.  Recs: - For now, will order stool culture - Will order imodium PRN as needed for diarrhea - Please do not hesitate to call if further questions or concerns.   Lucas Small [facility] Pager: [phone]

## 2016-10-09 NOTE — Progress Notes (Signed)
Patient in day room interacting with peers at the beginning of the shift.He  reported that he had a good day except for the flatulence related to the irritable bowel syndrome. He said the problem started about 5 days ago but he had the history. Patient denied SI/HI and denied Hallucination. Patient provided Stool sample but there wasn't occult test kit on the unit. We called [facility] to send some through the security. Writer encouraged and supported patient. Other than the issue related to his bowel, he seemed pleasant and cooperative. Q 15 minute check continues for safety.

## 2016-10-09 NOTE — Tx Team (Signed)
Interdisciplinary Treatment and Diagnostic Plan Update  10/09/2016 Time of Session: 0930 Lucas Small MRN: [id number]  Principal Diagnosis: Substance Induced Mood Disorder  Secondary Diagnoses: Principal Problem:   MDD (major depressive disorder), recurrent severe, without psychosis ([place])   Current Medications:  Current Facility-Administered Medications  Medication Dose Route Frequency Provider Last Rate Last Dose  . acetaminophen (TYLENOL) tablet 650 mg  650 mg Oral Q6H PRN Kerrie Buffalo, NP   650 mg at 10/09/16 0851  . alum & mag hydroxide-simeth (MAALOX/MYLANTA) 200-200-20 MG/5ML suspension 30 mL  30 mL Oral Q4H PRN Kerrie Buffalo, NP   30 mL at 10/08/16 2039  . DULoxetine (CYMBALTA) DR capsule 40 mg  40 mg Oral Daily Izediuno, Laruth Bouchard, MD   40 mg at 10/09/16 0848  . gabapentin (NEURONTIN) capsule 300 mg  300 mg Oral TID Artist Beach, MD   300 mg at 10/09/16 0848  . insulin aspart (novoLOG) injection 0-15 Units  0-15 Units Subcutaneous TID WC Lindell Spar I, NP   2 Units at 10/09/16 0603  . insulin aspart (novoLOG) injection 3 Units  3 Units Subcutaneous TID WC Lindell Spar I, NP   3 Units at 10/09/16 0601  . metFORMIN (GLUCOPHAGE) tablet 500 mg  500 mg Oral BID WC Kerrie Buffalo, NP   500 mg at 10/09/16 0848  . multivitamin with minerals tablet 1 tablet  1 tablet Oral Daily Lindell Spar I, NP   1 tablet at 10/09/16 0848  . nicotine (NICODERM CQ - dosed in mg/24 hours) patch 21 mg  21 mg Transdermal Daily Izediuno, Laruth Bouchard, MD   21 mg at 10/09/16 0850  . simethicone (MYLICON) chewable tablet 80 mg  80 mg Oral Q6H PRN Benjamine Mola, FNP   80 mg at 10/09/16 0602  . thiamine (B-1) injection 100 mg  100 mg Intramuscular Once Nwoko, Herbert Pun I, NP      . thiamine (VITAMIN B-1) tablet 100 mg  100 mg Oral Daily Nwoko, Agnes I, NP   100 mg at 10/09/16 0848  . traZODone (DESYREL) tablet 100 mg  100 mg Oral QHS PRN Lindell Spar I, NP   100 mg at 10/08/16 2121    Facility-Administered Medications Ordered in Other Encounters  Medication Dose Route Frequency Provider Last Rate Last Dose  . folic acid (FOLVITE) tablet 1 mg  1 mg Oral Daily Rankin, Shuvon B, NP      . haloperidol (HALDOL) tablet 5 mg  5 mg Oral Q6H PRN Rankin, Shuvon B, NP      . magnesium hydroxide (MILK OF MAGNESIA) suspension 30 mL  30 mL Oral Daily PRN Rankin, Shuvon B, NP       PTA Medications: Prescriptions Prior to Admission  Medication Sig Dispense Refill Last Dose  . aspirin EC 81 MG tablet Take 81 mg by mouth daily as needed.    Past Week at Unknown time  . escitalopram (LEXAPRO) 20 MG tablet Take 1 tablet (20 mg total) by mouth daily. 90 tablet 1 Past Week at Unknown time  . glucose blood (ONETOUCH VERIO) test strip Check Blood sugar twice daily. Dx:E11.9 100 each 12 Taking  . ibuprofen (ADVIL,MOTRIN) 200 MG tablet Take 200 mg by mouth as needed.   Past Week at Unknown time  . metFORMIN (GLUCOPHAGE) 500 MG tablet Take 1 tablet (500 mg total) by mouth 2 (two) times daily with a meal. 180 tablet 1 Past Week at Unknown time  . Multiple Vitamin (MULTIVITAMIN WITH MINERALS) TABS tablet  Take 1 tablet by mouth daily.   Past Week at Unknown time  . NONFORMULARY OR COMPOUNDED ITEM Abdominal binder dx abd hernias 1 each 1 Taking  . ONETOUCH DELICA LANCETS FINE MISC Check Blood sugar twice daily. Dx:E11.9 100 each 12 Taking  . oxyCODONE-acetaminophen (ROXICET) 5-325 MG tablet Take 1 tablet by mouth every 8 (eight) hours as needed for severe pain. 20 tablet 0 10/02/2016 at Unknown time  . pantoprazole (PROTONIX) 40 MG tablet Take 1 tablet (40 mg total) by mouth 2 (two) times daily. Reported on 07/11/2015 (Patient taking differently: Take 40 mg by mouth 2 (two) times daily. ) 180 tablet 1 Past Week at Unknown time  . polyethylene glycol (MIRALAX / GLYCOLAX) packet Take 17 g by mouth daily as needed for mild constipation. Reported on 07/11/2015 14 each 1 Past Week at Unknown time  . rosuvastatin  (CRESTOR) 20 MG tablet TAKE 1 TABLET(20 MG) BY MOUTH DAILY 90 tablet 0 Past Week at Unknown time  . traZODone (DESYREL) 50 MG tablet Take 1 tablet (50 mg total) by mouth at bedtime as needed for sleep. 30 tablet 3 Past Week at Unknown time    Patient Stressors: Financial difficulties Health problems Loss of friends, lover, and family members to death Substance abuse  Patient Strengths: Ability for insight Capable of independent living Motivation for treatment/growth Supportive family/friends  Treatment Modalities: Medication Management, Group therapy, Case management,  1 to 1 session with clinician, Psychoeducation, Recreational therapy.   Physician Treatment Plan for Primary Diagnosis: MDD (major depressive disorder), recurrent severe, without psychosis ([place]) Long Term Goal(s): Improvement in symptoms so as ready for discharge Improvement in symptoms so as ready for discharge   Short Term Goals: Ability to identify changes in lifestyle to reduce recurrence of condition will improve Ability to verbalize feelings will improve Ability to disclose and discuss suicidal ideas Ability to demonstrate self-control will improve Ability to identify and develop effective coping behaviors will improve Ability to maintain clinical measurements within normal limits will improve Compliance with prescribed medications will improve Ability to identify triggers associated with substance abuse/mental health issues will improve Ability to identify changes in lifestyle to reduce recurrence of condition will improve Ability to verbalize feelings will improve Ability to disclose and discuss suicidal ideas Ability to demonstrate self-control will improve Ability to identify and develop effective coping behaviors will improve Ability to maintain clinical measurements within normal limits will improve Compliance with prescribed medications will improve Ability to identify triggers associated with  substance abuse/mental health issues will improve  Medication Management: Evaluate patient's response, side effects, and tolerance of medication regimen.  Therapeutic Interventions: 1 to 1 sessions, Unit Group sessions and Medication administration.  Evaluation of Outcomes: Progressing  Physician Treatment Plan for Secondary Diagnosis: Principal Problem:   MDD (major depressive disorder), recurrent severe, without psychosis ([place])  Long Term Goal(s): Improvement in symptoms so as ready for discharge Improvement in symptoms so as ready for discharge   Short Term Goals: Ability to identify changes in lifestyle to reduce recurrence of condition will improve Ability to verbalize feelings will improve Ability to disclose and discuss suicidal ideas Ability to demonstrate self-control will improve Ability to identify and develop effective coping behaviors will improve Ability to maintain clinical measurements within normal limits will improve Compliance with prescribed medications will improve Ability to identify triggers associated with substance abuse/mental health issues will improve Ability to identify changes in lifestyle to reduce recurrence of condition will improve Ability to verbalize feelings will improve Ability  to disclose and discuss suicidal ideas Ability to demonstrate self-control will improve Ability to identify and develop effective coping behaviors will improve Ability to maintain clinical measurements within normal limits will improve Compliance with prescribed medications will improve Ability to identify triggers associated with substance abuse/mental health issues will improve     Medication Management: Evaluate patient's response, side effects, and tolerance of medication regimen.  Therapeutic Interventions: 1 to 1 sessions, Unit Group sessions and Medication administration.  Evaluation of Outcomes: Progressing   RN Treatment Plan for Primary Diagnosis: MDD (major  depressive disorder), recurrent severe, without psychosis ([place]) Long Term Goal(s): Knowledge of disease and therapeutic regimen to maintain health will improve  Short Term Goals: Ability to remain free from injury will improve, Ability to verbalize feelings will improve and Ability to disclose and discuss suicidal ideas  Medication Management: RN will administer medications as ordered by provider, will assess and evaluate patient's response and provide education to patient for prescribed medication. RN will report any adverse and/or side effects to prescribing provider.  Therapeutic Interventions: 1 on 1 counseling sessions, Psychoeducation, Medication administration, Evaluate responses to treatment, Monitor vital signs and CBGs as ordered, Perform/monitor CIWA, COWS, AIMS and Fall Risk screenings as ordered, Perform wound care treatments as ordered.  Evaluation of Outcomes: Progressing   LCSW Treatment Plan for Primary Diagnosis: MDD (major depressive disorder), recurrent severe, without psychosis ([place]) Long Term Goal(s): Safe transition to appropriate next level of care at discharge, Engage patient in therapeutic group addressing interpersonal concerns.  Short Term Goals: Engage patient in aftercare planning with referrals and resources, Facilitate patient progression through stages of change regarding substance use diagnoses and concerns and Identify triggers associated with mental health/substance abuse issues  Therapeutic Interventions: Assess for all discharge needs, 1 to 1 time with Social worker, Explore available resources and support systems, Assess for adequacy in community support network, Educate family and significant other(s) on suicide prevention, Complete Psychosocial Assessment, Interpersonal group therapy.  Evaluation of Outcomes: Progressing   Progress in Treatment: Attending groups: Yes. Participating in groups: Yes. Taking medication as prescribed: Yes. Toleration  medication: Yes. Family/Significant other contact made: No, will contact:  family member if patient consents Patient understands diagnosis: Yes. Discussing patient identified problems/goals with staff: Yes. Medical problems stabilized or resolved: Yes. Denies suicidal/homicidal ideation: No. Passive SI/Able to contract for safety on the unit.  Issues/concerns per patient self-inventory: No. Other: n/a   New problem(s) identified: Pt continues to present as irritable; demanding; med seeking.   New Short Term/Long Term Goal(s): detox; medication stabilization; elimination of SI thoughts and VH, and development of comprehensive mental wellness/sobriety plan. 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   Discharge Plan or Barriers: Pt will return home and follow up outpatient with [facility].  Reason for Continuation of Hospitalization: Depression Medication stabilization Withdrawal symptoms  Estimated Length of Stay: 1-3 days   Attendees: Patient: 10/09/2016 10:10 AM  Physician: Dr. Sanjuana Letters MD 10/09/2016 10:10 AM  Nursing: Farrel Gobble, RN 10/09/2016 10:10 AM  RN Care Manager: Lars Pinks CM 10/09/2016 10:10 AM  Social Worker: Matthew Saras, [place] 10/09/2016 10:10 AM  Recreational Therapist: x 10/09/2016 10:10 AM  Other: Lindell Spar NP 10/09/2016 10:10 AM  Other:  10/09/2016 10:10 AM  Other: 10/09/2016 10:10 AM    Scribe for Treatment Team: Georga Kaufmann, [place] 10/09/2016 10:10 AM

## 2016-10-09 NOTE — Progress Notes (Addendum)
  DATA ACTION RESPONSE  Objective- Pt. is visible in room, seen resting in bed. Presents with a flat/depressed affect and mood. Isolative to room provided with encouragement. Pt. was minimal/guarded with interaction. C/o of indigestion and gas. No abnormal s/s.  Subjective- Denies having any SI/HI/AVH at this time. Rates pain 7/10; generalized. Is cooperative and remain safe on the unit. Pt. had question whether Neurontin will be increase?  1:1 interaction in private to establish rapport. Encouragement, education, & support given from staff. Meds. ordered and administered. PRN Maalox, tylenol, mylicon, and Trazodone requested and will re-eval accordingly. CPAP by beside. Will collect in a.m. BS at bedtime was 181.   Safety maintained with Q 15 checks. Continues to follow treatment plan and will monitor closely. No additonal questions/concerns noted.

## 2016-10-09 NOTE — Progress Notes (Signed)
[facility] MD Progress Note  10/09/2016 4:35 PM Lucas Small  MRN:  [id number]   Subjective: patient reports feeling better, less irritable , today. He does continue to feel some depression, but denies SI.  He complains of GI symptoms, mainly flatulence, which he states is a frequent issue for him, but seems worse since he was admitted, as well as some diarrhea. He denies fever, severe abdominal pain, vomiting , or fever. No watery or bloody stools endorsed .   Objective: I have discussed case with treatment team and have met with patient. Patient presents with some improvement compared to admission. Today not irritable or demanding , reporting he is feeling better insofar as his mood. Denies SI. Visible in day room, going to some groups, limited interaction with peers, staff.  Focused on symptoms of indigestion today- has chronic gas/flatulence, but feels it has worsened since admission, which he thinks may be due to food here- states that at home tends to " know what kind of foods I can eat ". He also reports loose stools, but denies blood in stools, and does not endorse any systemic symptoms. He is tolerating PO intake well , and has no fever. He does not think it is a medication side effect. ( Currently on Cymbalta antidepressant trial) .    Principal Problem: MDD (major depressive disorder), recurrent severe, without psychosis ([place]) Diagnosis:   Patient Active Problem List   Diagnosis Date Noted  . MDD (major depressive disorder), recurrent severe, without psychosis ([place]) [F33.2] 10/05/2016  . OSA (obstructive sleep apnea) [G47.33] 04/25/2016  . Back pain [M54.9] 12/15/2015  . GERD (gastroesophageal reflux disease) [K21.9] 12/15/2015  . DM (diabetes mellitus) type II uncontrolled, periph vascular disorder ([place]) [E11.51, E11.65] 12/15/2015  . Hyperlipidemia LDL goal <70 [E78.5] 12/15/2015  . Diverticulitis [K57.92] 03/05/2013  . Tobacco dependency [F17.200] 03/05/2013  . DOE (dyspnea on  exertion) [R06.09] 03/05/2013  . Major depression, recurrent ([place]) [F33.9] 11/24/2012  . Snoring disorder 08/27/2011  . Substance induced mood disorder ([place]) [F19.94] 08/23/2011    Class: Acute  . Alcohol dependency ([place]) [F10.20] 08/23/2011    Class: Acute   Total Time spent with patient: 20 minutes  Past Psychiatric History: see H&P  Past Medical History:  Past Medical History:  Diagnosis Date  . Alcoholic ([place])   . CHF (congestive heart failure) ([place])   . Depression   . Diabetes mellitus without complication (HCC)    metformin  . Dyspnea    with exertion .  Marland Kitchen Dysrhythmia    while hospitalized in [place]  . History of kidney stones   . Hypertension   . MRSA (methicillin resistant Staphylococcus aureus)   . Multiple myeloma ([place]) 1990's  . 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 PNA (pneumonia)   . Renal disorder   . Renal failure   . Renal insufficiency   . Respiratory failure ([place])   . Sickle cell anemia (HCC)   . Sleep apnea    no CPAP machine    Past Surgical History:  Procedure Laterality Date  . ABDOMINAL SURGERY    . CARDIAC SURGERY    . HERNIA REPAIR    . ILEOSTOMY    . PERIPHERALLY INSERTED CENTRAL CATHETER INSERTION     Family History: History reviewed. No pertinent family history. Family Psychiatric  History: see H&P Social History:  History  Alcohol Use  . Yes    Comment: occasionally     History  Drug Use No    Comment: THC cocaine  Social History   Social History  . Marital status: Divorced    Spouse name: N/A  . Number of children: N/A  . Years of education: N/A   Occupational History  .      disabled   Social History Main Topics  . Smoking status: Former Smoker    Packs/day: 1.00    Years: 28.00    Types: Cigarettes    Quit date: 08/28/2016  . Smokeless tobacco: Never Used  . Alcohol use Yes     Comment: occasionally  . Drug use: No     Comment: THC cocaine  . Sexual activity: Yes     Comment: Not asked   Other Topics Concern   . None   Social History Narrative   Exercise---  Walking some --  Not enough   Additional Social History:   Sleep: improved   Appetite:  improving   Current Medications: Current Facility-Administered Medications  Medication Dose Route Frequency Provider Last Rate Last Dose  . acetaminophen (TYLENOL) tablet 650 mg  650 mg Oral Q6H PRN Kerrie Buffalo, NP   650 mg at 10/09/16 0851  . alum & mag hydroxide-simeth (MAALOX/MYLANTA) 200-200-20 MG/5ML suspension 30 mL  30 mL Oral Q4H PRN Kerrie Buffalo, NP   30 mL at 10/08/16 2039  . DULoxetine (CYMBALTA) DR capsule 40 mg  40 mg Oral Daily Izediuno, Laruth Bouchard, MD   40 mg at 10/09/16 0848  . gabapentin (NEURONTIN) capsule 300 mg  300 mg Oral TID Artist [place], MD   300 mg at 10/09/16 1225  . insulin aspart (novoLOG) injection 0-15 Units  0-15 Units Subcutaneous TID WC Lindell Spar I, NP   5 Units at 10/09/16 1223  . insulin aspart (novoLOG) injection 3 Units  3 Units Subcutaneous TID WC Lindell Spar I, NP   3 Units at 10/09/16 1222  . metFORMIN (GLUCOPHAGE) tablet 500 mg  500 mg Oral BID WC Kerrie Buffalo, NP   500 mg at 10/09/16 0848  . multivitamin with minerals tablet 1 tablet  1 tablet Oral Daily Lindell Spar I, NP   1 tablet at 10/09/16 0848  . nicotine (NICODERM CQ - dosed in mg/24 hours) patch 21 mg  21 mg Transdermal Daily Izediuno, Laruth Bouchard, MD   21 mg at 10/09/16 0850  . simethicone (MYLICON) chewable tablet 80 mg  80 mg Oral Q6H PRN Benjamine Mola, FNP   80 mg at 10/09/16 0602  . thiamine (B-1) injection 100 mg  100 mg Intramuscular Once Nwoko, Herbert Pun I, NP      . thiamine (VITAMIN B-1) tablet 100 mg  100 mg Oral Daily Nwoko, Agnes I, NP   100 mg at 10/09/16 0848  . traZODone (DESYREL) tablet 100 mg  100 mg Oral QHS PRN Lindell Spar I, NP   100 mg at 10/08/16 2121   Facility-Administered Medications Ordered in Other Encounters  Medication Dose Route Frequency Provider Last Rate Last Dose  . folic acid (FOLVITE) tablet 1 mg   1 mg Oral Daily Rankin, Shuvon B, NP      . haloperidol (HALDOL) tablet 5 mg  5 mg Oral Q6H PRN Rankin, Shuvon B, NP      . magnesium hydroxide (MILK OF MAGNESIA) suspension 30 mL  30 mL Oral Daily PRN Rankin, Shuvon B, NP        Lab Results:  Results for orders placed or performed during the hospital encounter of 10/03/16 (from the past 48 hour(s))  Glucose, capillary  Status: Abnormal   Collection Time: 10/07/16  5:02 PM  Result Value Ref Range   Glucose-Capillary 258 (H) 65 - 99 mg/dL  Glucose, capillary     Status: Abnormal   Collection Time: 10/07/16  8:54 PM  Result Value Ref Range   Glucose-Capillary 172 (H) 65 - 99 mg/dL  Glucose, capillary     Status: Abnormal   Collection Time: 10/08/16  6:04 AM  Result Value Ref Range   Glucose-Capillary 151 (H) 65 - 99 mg/dL   Comment 1 Notify RN    Comment 2 Document in Chart   Glucose, capillary     Status: Abnormal   Collection Time: 10/08/16 12:23 PM  Result Value Ref Range   Glucose-Capillary 181 (H) 65 - 99 mg/dL  Glucose, capillary     Status: Abnormal   Collection Time: 10/08/16  4:47 PM  Result Value Ref Range   Glucose-Capillary 208 (H) 65 - 99 mg/dL   Comment 1 Notify RN    Comment 2 Document in Chart   Glucose, capillary     Status: Abnormal   Collection Time: 10/08/16  8:57 PM  Result Value Ref Range   Glucose-Capillary 181 (H) 65 - 99 mg/dL   Comment 1 Notify RN   Glucose, capillary     Status: Abnormal   Collection Time: 10/09/16  5:45 AM  Result Value Ref Range   Glucose-Capillary 146 (H) 65 - 99 mg/dL  Glucose, capillary     Status: Abnormal   Collection Time: 10/09/16 12:09 PM  Result Value Ref Range   Glucose-Capillary 227 (H) 65 - 99 mg/dL    Blood Alcohol level:  Lab Results  Component Value Date   ETH 246 (H) 10/02/2016   ETH <5 37/85/8850    Metabolic Disorder Labs: Lab Results  Component Value Date   HGBA1C 6.9 (H) 02/13/2016   No results found for: PROLACTIN Lab Results  Component  Value Date   CHOL 158 02/13/2016   TRIG 143.0 02/13/2016   HDL 35.20 (L) 02/13/2016   CHOLHDL 4 02/13/2016   VLDL 28.6 02/13/2016   LDLCALC 94 02/13/2016   LDLCALC 165 (H) 12/05/2015    Physical Findings: AIMS: Facial and Oral Movements Muscles of Facial Expression: None, normal Lips and Perioral Area: None, normal Jaw: None, normal Tongue: None, normal,Extremity Movements Upper (arms, wrists, hands, fingers): None, normal Lower (legs, knees, ankles, toes): None, normal, Trunk Movements Neck, shoulders, hips: None, normal, Overall Severity Severity of abnormal movements (highest score from questions above): None, normal Incapacitation due to abnormal movements: None, normal Patient's awareness of abnormal movements (rate only patient's report): No Awareness, Dental Status Current problems with teeth and/or dentures?: No Does patient usually wear dentures?: No  CIWA:  CIWA-Ar Total: 1 COWS:  COWS Total Score: 3  Musculoskeletal: Strength & Muscle Tone: within normal limits Gait & Station: normal Patient leans: N/A  Psychiatric Specialty Exam: Physical Exam  Vitals reviewed. Constitutional: He is oriented to person, place, and time. He appears well-developed.  Cardiovascular: Normal rate.   Neurological: He is alert and oriented to person, place, and time.  Psychiatric: He has a normal mood and affect. His behavior is normal.      Psychiatric Specialty Exam: Physical Exam  Vitals reviewed. Constitutional: He is oriented to person, place, and time. He appears well-developed.  Cardiovascular: Normal rate.   Neurological: He is alert and oriented to person, place, and time.  Psychiatric: He has a normal mood and affect. His behavior is normal.  Review of Systems  Gastrointestinal:       Severe flatulence   Psychiatric/Behavioral: Positive for depression and substance abuse. Negative for hallucinations and suicidal ideas. The patient is nervous/anxious and has  insomnia.   All other systems reviewed and are negative. Reports flatulence   Blood pressure 107/71, pulse 97, temperature 98.6 F (37 C), temperature source Oral, resp. rate 18, height 5' 8.5" (1.74 m), weight 95 kg (209 lb 8 oz).Body mass index is 31.39 kg/m.  General Appearance: Fairly Groomed  Eye Contact:  Good  Speech:  Normal Rate  Volume:  Normal  Mood:  Depressed and but improving compared to admission  Affect:  mildly constricted, but reactive   Thought Process:  Goal Directed and Descriptions of Associations: Intact  Orientation:  Full (Time, Place, and Person)  Thought Content:  No hallucinations, no delusions   Suicidal Thoughts:  No denies suicidal plan or intention , contracts for safety on unit, denies homicidal or violent ideations   Homicidal Thoughts:  No  Memory:  Recent and remote grossly intact   Judgement: improving   Insight:  Fair  Psychomotor Activity:  more visible on unit, mobilizes with walker   Concentration:  Concentration: Good and Attention Span: Good  Recall:  Good  Fund of Knowledge:  Good  Language:  Good  Akathisia:  No  Handed:    AIMS (if indicated):     Assets:  Communication Skills Desire for Improvement  ADL's:  Intact  Cognition:  WNL  Sleep:  Number of Hours: 6.5     Review of Systems  Gastrointestinal:       Severe flatulence   Psychiatric/Behavioral: Positive for depression and substance abuse. Negative for hallucinations and suicidal ideas. The patient is nervous/anxious and has insomnia.   All other systems reviewed and are negative.    Assessment- patient is presenting with partially improved mood and range of affect, but remains constricted. Denies medication side effects. He does endorse flatulence, some diarrhea, but thinks it may be more related to different food in hospital environment than what he takes at home. Still, this complaint has been persistent.  No active SI.   Treatment Plan Summary:  Treatment plan  reviewed as below today 5/15   Continue to encourage group and milieu participation Continue to encourage efforts to maintain sobriety and work on relapse prevention  -Continue Cymbalta '40mg'$  po daily for depression/pain -Continue Neurontin '300mg'$  po tid for anxiety/ETOH -Continue Trazodone '100mg'$  po qhs prn insomnia -Continue Simethicone chewable tabs '80mg'$  q6h prn flatulence - I have discussed case with hosptalist consultant regarding management of GI symptoms as above -Treatment team working on disposition [place], MD 10/09/2016, 4:35 PM    Patient ID: [name], male   DOB: [birth year], 51 y.o.   MRN: [id number]

## 2016-10-09 NOTE — Progress Notes (Signed)
DAR NOTE: Patient presents with anxious affect and depressed mood. Pt stated he feels better today than he felt yesterday. Pt has been in the dayroom with peers talking. Denies pain, auditory and visual hallucinations.  Rates depression at 8, hopelessness at 8, and anxiety at 8.  Maintained on routine safety checks.  Medications given as prescribed.  Support and encouragement offered as needed.  Attended group and participated.  States goal for today is " see my doctor about my medication."  Patient observed socializing with peers in the dayroom.  Offered no complaint.

## 2016-10-09 NOTE — Plan of Care (Signed)
Problem: Activity: Goal: Interest or engagement in activities will improve Outcome: Progressing Pt. remains isolative to room expect for meals and groups. Encouragement provided.

## 2016-10-09 NOTE — [facility] Group Notes (Signed)
[facility] LCSW Group Therapy 10/09/2016 1:15 PM  Type of Therapy: Group Therapy- Feelings about Diagnosis  Participation Level: Pt invited. Did not attend.  Georga Kaufmann, MSW, LCSWA 10/09/2016 4:12 PM

## 2016-10-09 NOTE — Progress Notes (Signed)
Recreation Therapy Notes  Animal-Assisted Activity (AAA) Program Checklist/Progress Notes Patient Eligibility Criteria Checklist & Daily Group note for Rec TxIntervention  Date: 05.15.2018 Time: 2:45pm Location: [address]    AAA/T Program Assumption of Risk Form signed by Patient/ or Parent Legal Guardian Yes  Patient is free of allergies or sever asthma Yes  Patient reports no fear of animals Yes  Patient reports no history of cruelty to animals Yes  Patient understands his/her participation is voluntary Yes  Behavioral Response: Did not attend.   Laureen Ochs Rulon Abdalla, LRT/CTRS       Matan Steen L 10/09/2016 3:00 PM

## 2016-10-09 NOTE — Progress Notes (Signed)
Adult Psychoeducational Group Note  Date:  10/09/2016 Time:  9:12 PM  Group Topic/Focus:  Wrap-Up Group:   The focus of this group is to help patients review their daily goal of treatment and discuss progress on daily workbooks.  Participation Level:  Active  Participation Quality:  Appropriate and Attentive  Affect:  Appropriate  Cognitive:  Appropriate  Insight: Appropriate  Engagement in Group:  Engaged  Modes of Intervention:  Discussion  Additional Comments:  Pt stated his day was a 45. Pt stated he may have crohns disease and may start some new meds. Pt stated his goal was to talk to the doctor and make some improvements on his meds to help his pancreas.  [name] 10/09/2016, 9:12 PM

## 2016-10-10 LAB — GLUCOSE, CAPILLARY
Glucose-Capillary: 195 mg/dL — ABNORMAL HIGH (ref 65–99)
Glucose-Capillary: 193 mg/dL — ABNORMAL HIGH (ref 65–99)
Glucose-Capillary: 242 mg/dL — ABNORMAL HIGH (ref 65–99)
Glucose-Capillary: 290 mg/dL — ABNORMAL HIGH (ref 65–99)

## 2016-10-10 MED ORDER — TRAZODONE HCL 150 MG PO TABS
150.0000 mg | ORAL_TABLET | Freq: Every evening | ORAL | Status: DC | PRN
  Administered 2016-10-10 – 2016-10-11 (×2): 150 mg via ORAL
  Filled 2016-10-10 (×2): qty 1

## 2016-10-10 NOTE — Progress Notes (Signed)
Recreation Therapy Notes  Date: 10/10/16 Time: 0930 Location: [address] Dayroom  Group Topic: Stress Management  Goal Area(s) Addresses:  Patient will verbalize importance of using healthy stress management.  Patient will identify positive emotions associated with healthy stress management.   Intervention: Stress Management  Activity :  Body Scan Meditation.  LRT introduced the stress management technique of meditation.  LRT played a meditation from the Calm app to allow patients the opportunity to scan and acknowledge the sensations and tension within the body.  Patients were to follow along as the meditation played to engage in the activity.  Education:  Stress Management, Discharge Planning.   Education Outcome: Acknowledges edcuation/In group clarification offered/Needs additional education  Clinical Observations/Feedback: Pt did not attend group.   Victorino Sparrow, LRT/CTRS         Victorino Sparrow A 10/10/2016 12:53 PM

## 2016-10-10 NOTE — Progress Notes (Addendum)
[facility] MD Progress Note  10/10/2016 6:10 PM Lucas Small  MRN:  [id number]   Subjective: patient states he is feeling partially better today. Still depressed, but does feel his mood has improved partially. He has chronic , intermittent GI symptoms- states that flatulence and diarrhea are both better today and at this time not feeling uncomfortable or distended. Denies medication side effects. Denies suicidal ideations.  Objective: I have discussed case with treatment team and have met with patient. Patient is presenting with partial improvement : he is less severely depressed, affect is more reactive. He does endorse some improvement, but continues to describe his mood and depressed and anxious. Denies suicidal ideations. Patient presents pleasant, interactive, visible in day room, behavior in good control. No longer presenting irritable.  Denies medication side effects. As above, GI symptoms are improving , which he attributes to " my body is getting accustomed to the food here ".  Going to some groups, visible on unit .     Principal Problem: MDD (major depressive disorder), recurrent severe, without psychosis ([place]) Diagnosis:   Patient Active Problem List   Diagnosis Date Noted  . MDD (major depressive disorder), recurrent severe, without psychosis ([place]) [F33.2] 10/05/2016  . OSA (obstructive sleep apnea) [G47.33] 04/25/2016  . Back pain [M54.9] 12/15/2015  . GERD (gastroesophageal reflux disease) [K21.9] 12/15/2015  . DM (diabetes mellitus) type II uncontrolled, periph vascular disorder ([place]) [E11.51, E11.65] 12/15/2015  . Hyperlipidemia LDL goal <70 [E78.5] 12/15/2015  . Diverticulitis [K57.92] 03/05/2013  . Tobacco dependency [F17.200] 03/05/2013  . DOE (dyspnea on exertion) [R06.09] 03/05/2013  . Major depression, recurrent ([place]) [F33.9] 11/24/2012  . Snoring disorder 08/27/2011  . Substance induced mood disorder ([place]) [F19.94] 08/23/2011    Class: Acute  . Alcohol  dependency ([place]) [F10.20] 08/23/2011    Class: Acute   Total Time spent with patient: 20 minutes  Past Psychiatric History: see H&P  Past Medical History:  Past Medical History:  Diagnosis Date  . Alcoholic ([place])   . CHF (congestive heart failure) ([place])   . Depression   . Diabetes mellitus without complication (HCC)    metformin  . Dyspnea    with exertion .  Marland Kitchen Dysrhythmia    while hospitalized in [place]  . History of kidney stones   . Hypertension   . MRSA (methicillin resistant Staphylococcus aureus)   . Multiple myeloma ([place]) 1990's  . 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 PNA (pneumonia)   . Renal disorder   . Renal failure   . Renal insufficiency   . Respiratory failure ([place])   . Sickle cell anemia (HCC)   . Sleep apnea    no CPAP machine    Past Surgical History:  Procedure Laterality Date  . ABDOMINAL SURGERY    . CARDIAC SURGERY    . HERNIA REPAIR    . ILEOSTOMY    . PERIPHERALLY INSERTED CENTRAL CATHETER INSERTION     Family History: History reviewed. No pertinent family history. Family Psychiatric  History: see H&P Social History:  History  Alcohol Use  . Yes    Comment: occasionally     History  Drug Use No    Comment: THC cocaine    Social History   Social History  . Marital status: Divorced    Spouse name: N/A  . Number of children: N/A  . Years of education: N/A   Occupational History  .      disabled   Social History Main Topics  . Smoking  status: Former Smoker    Packs/day: 1.00    Years: 28.00    Types: Cigarettes    Quit date: 08/28/2016  . Smokeless tobacco: Never Used  . Alcohol use Yes     Comment: occasionally  . Drug use: No     Comment: THC cocaine  . Sexual activity: Yes     Comment: Not asked   Other Topics Concern  . None   Social History Narrative   Exercise---  Walking some --  Not enough   Additional Social History:   Sleep: fair- states only partially improved on current Trazodone dose   Appetite: improving    Current Medications: Current Facility-Administered Medications  Medication Dose Route Frequency Provider Last Rate Last Dose  . acetaminophen (TYLENOL) tablet 650 mg  650 mg Oral Q6H PRN Kerrie Buffalo, NP   650 mg at 10/10/16 0824  . alum & mag hydroxide-simeth (MAALOX/MYLANTA) 200-200-20 MG/5ML suspension 30 mL  30 mL Oral Q4H PRN Kerrie Buffalo, NP   30 mL at 10/08/16 2039  . DULoxetine (CYMBALTA) DR capsule 40 mg  40 mg Oral Daily Izediuno, Laruth Bouchard, MD   40 mg at 10/10/16 0819  . gabapentin (NEURONTIN) capsule 300 mg  300 mg Oral TID Artist Beach, MD   300 mg at 10/10/16 1704  . insulin aspart (novoLOG) injection 0-15 Units  0-15 Units Subcutaneous TID WC Lindell Spar I, NP   5 Units at 10/10/16 1719  . insulin aspart (novoLOG) injection 3 Units  3 Units Subcutaneous TID WC Lindell Spar I, NP   3 Units at 10/10/16 1718  . loperamide (IMODIUM) capsule 2 mg  2 mg Oral PRN Donne Hazel, MD      . metFORMIN (GLUCOPHAGE) tablet 500 mg  500 mg Oral BID WC Kerrie Buffalo, NP   500 mg at 10/10/16 1704  . multivitamin with minerals tablet 1 tablet  1 tablet Oral Daily Nwoko, Herbert Pun I, NP   1 tablet at 10/10/16 0819  . nicotine (NICODERM CQ - dosed in mg/24 hours) patch 21 mg  21 mg Transdermal Daily Izediuno, Laruth Bouchard, MD   21 mg at 10/10/16 0823  . simethicone (MYLICON) chewable tablet 80 mg  80 mg Oral Q6H PRN Benjamine Mola, FNP   80 mg at 10/10/16 0820  . thiamine (B-1) injection 100 mg  100 mg Intramuscular Once Nwoko, Herbert Pun I, NP      . thiamine (VITAMIN B-1) tablet 100 mg  100 mg Oral Daily Lindell Spar I, NP   100 mg at 10/10/16 0819  . traZODone (DESYREL) tablet 100 mg  100 mg Oral QHS PRN Lindell Spar I, NP   100 mg at 10/09/16 2119   Facility-Administered Medications Ordered in Other Encounters  Medication Dose Route Frequency Provider Last Rate Last Dose  . folic acid (FOLVITE) tablet 1 mg  1 mg Oral Daily Rankin, Shuvon B, NP      . haloperidol (HALDOL) tablet 5 mg  5  mg Oral Q6H PRN Rankin, Shuvon B, NP      . magnesium hydroxide (MILK OF MAGNESIA) suspension 30 mL  30 mL Oral Daily PRN Rankin, Shuvon B, NP        Lab Results:  Results for orders placed or performed during the hospital encounter of 10/03/16 (from the past 48 hour(s))  Glucose, capillary     Status: Abnormal   Collection Time: 10/08/16  8:57 PM  Result Value Ref Range   Glucose-Capillary 181 (H) 65 -  99 mg/dL   Comment 1 Notify RN   Glucose, capillary     Status: Abnormal   Collection Time: 10/09/16  5:45 AM  Result Value Ref Range   Glucose-Capillary 146 (H) 65 - 99 mg/dL  Glucose, capillary     Status: Abnormal   Collection Time: 10/09/16 12:09 PM  Result Value Ref Range   Glucose-Capillary 227 (H) 65 - 99 mg/dL  Glucose, capillary     Status: Abnormal   Collection Time: 10/09/16  5:23 PM  Result Value Ref Range   Glucose-Capillary 225 (H) 65 - 99 mg/dL  Glucose, capillary     Status: Abnormal   Collection Time: 10/09/16  8:43 PM  Result Value Ref Range   Glucose-Capillary 242 (H) 65 - 99 mg/dL   Comment 1 Notify RN   Glucose, capillary     Status: Abnormal   Collection Time: 10/10/16  5:46 AM  Result Value Ref Range   Glucose-Capillary 195 (H) 65 - 99 mg/dL   Comment 1 Notify RN   Glucose, capillary     Status: Abnormal   Collection Time: 10/10/16 12:01 PM  Result Value Ref Range   Glucose-Capillary 193 (H) 65 - 99 mg/dL  Glucose, capillary     Status: Abnormal   Collection Time: 10/10/16  5:07 PM  Result Value Ref Range   Glucose-Capillary 242 (H) 65 - 99 mg/dL    Blood Alcohol level:  Lab Results  Component Value Date   ETH 246 (H) 10/02/2016   ETH <5 29/93/7169    Metabolic Disorder Labs: Lab Results  Component Value Date   HGBA1C 6.9 (H) 02/13/2016   No results found for: PROLACTIN Lab Results  Component Value Date   CHOL 158 02/13/2016   TRIG 143.0 02/13/2016   HDL 35.20 (L) 02/13/2016   CHOLHDL 4 02/13/2016   VLDL 28.6 02/13/2016   LDLCALC 94  02/13/2016   LDLCALC 165 (H) 12/05/2015    Physical Findings: AIMS: Facial and Oral Movements Muscles of Facial Expression: None, normal Lips and Perioral Area: None, normal Jaw: None, normal Tongue: None, normal,Extremity Movements Upper (arms, wrists, hands, fingers): None, normal Lower (legs, knees, ankles, toes): None, normal, Trunk Movements Neck, shoulders, hips: None, normal, Overall Severity Severity of abnormal movements (highest score from questions above): None, normal Incapacitation due to abnormal movements: None, normal Patient's awareness of abnormal movements (rate only patient's report): No Awareness, Dental Status Current problems with teeth and/or dentures?: No Does patient usually wear dentures?: No  CIWA:  CIWA-Ar Total: 1 COWS:  COWS Total Score: 3  Musculoskeletal: Strength & Muscle Tone: within normal limits Gait & Station: normal Patient leans: N/A  Psychiatric Specialty Exam: Physical Exam  Vitals reviewed. Constitutional: He is oriented to person, place, and time. He appears well-developed.  Cardiovascular: Normal rate.   Neurological: He is alert and oriented to person, place, and time.  Psychiatric: He has a normal mood and affect. His behavior is normal.      Psychiatric Specialty Exam: Physical Exam  Vitals reviewed. Constitutional: He is oriented to person, place, and time. He appears well-developed.  Cardiovascular: Normal rate.   Neurological: He is alert and oriented to person, place, and time.  Psychiatric: He has a normal mood and affect. His behavior is normal.    Review of Systems  Gastrointestinal:       Severe flatulence   Psychiatric/Behavioral: Positive for depression and substance abuse. Negative for hallucinations and suicidal ideas. The patient is nervous/anxious and has insomnia.  All other systems reviewed and are negative. Partial improvement in flatulence, and decreased diarrhea, no vomiting , tolerating PO intake  well   Blood pressure 104/74, pulse 94, temperature 98.6 F (37 C), temperature source Oral, resp. rate 18, height 5' 8.5" (1.74 m), weight 95 kg (209 lb 8 oz).Body mass index is 31.39 kg/m.  General Appearance: Fairly Groomed  Eye Contact:  Good  Speech:  Normal Rate  Volume:  Normal  Mood:  remains depressed, but mood improving   Affect:  less constricted, vaguely anxious, affect more reactive, and no longer irritable  Thought Process:  Linear and Descriptions of Associations: Intact  Orientation:  Other:  fully alert and attentive  Thought Content:  No psychotic symptoms noted or endorsed at this time  Suicidal Thoughts:  No denies suicidal plan or intention , contracts for safety on unit, denies homicidal or violent ideations   Homicidal Thoughts:  No  Memory:  Recent and remote grossly intact   Judgement: fair- improving  Insight:  Fair- improving   Psychomotor Activity:  improved, mobilizing more easily, visible on unit   Concentration:  Concentration: Good and Attention Span: Good  Recall:  Good  Fund of Knowledge:  Good  Language:  Good  Akathisia:  Negative  Handed:    AIMS (if indicated):     Assets:  Communication Skills Resilience  ADL's:  Intact  Cognition:  WNL  Sleep:  Number of Hours: 5.75     Review of Systems  Gastrointestinal:       Severe flatulence   Psychiatric/Behavioral: Positive for depression and substance abuse. Negative for hallucinations and suicidal ideas. The patient is nervous/anxious and has insomnia.   All other systems reviewed and are negative.    Assessment- patient is presenting with partial improvement in mood and range of affect. Currently describes still feeling depressed, anxious, but acknowledging improvement, denies SI. He is also describing improving GI symptoms and is less somatically focused at this time. Tolerating medications well at present. Patient does report some ongoing insomnia. He feels Trazodone has been well  tolerated, partially effective .   Treatment Plan Summary:  Treatment plan reviewed as below today 5/16  Continue to encourage group and milieu participation Continue to encourage efforts to maintain sobriety and work on relapse prevention  -Continue Cymbalta 53m po daily for depression/pain -Continue Neurontin 3017mpo tid for anxiety/ETOH -Increase  Trazodone to 150 mg po qhs prn insomnia -Continue Simethicone chewable tabs 8050m6h prn flatulence -Treatment team working on disposition plaCajah's MountainD 10/10/2016, 6:10 PM    Patient ID: MicTomasa [place]ale   DOB: [id number] 55o.   MRN: [id number]

## 2016-10-10 NOTE — Progress Notes (Signed)
Patient ID: Lucas Small, male   DOB: [birth year], 51 y.o.   MRN: [id number]  DAR: Pt. Denies SI/HI and A/V Hallucinations.  He reports sleep is fair, appetite is fair, and concentration is poor. He rates depression 8/10, hopelessness 7/10, and anxiety 8/10. Patient does report some gas and some pain and received PRN medications for these complaints. Support and encouragement provided to the patient. Scheduled medications administered to patient per physician's orders. Patient is receptive and cooperative. He uses humor to mask his insecurities about his gas and abdominal distress. He is pleasant during interaction. Stool sample was obtained and ready for lab to collect. He reports some lightheadedness and dizziness however ambulates without difficulty. He has a walker available to him in his room, should he need aid in ambulation. Q15 minute checks are maintained for safety.

## 2016-10-10 NOTE — Plan of Care (Signed)
Problem: Medication: Goal: Compliance with prescribed medication regimen will improve Outcome: Progressing Pt. is takings meds as prescribed.

## 2016-10-10 NOTE — Progress Notes (Signed)
  DATA ACTION RESPONSE  Objective- Pt. is visible in the dayroom, seen watching TV. Presents with an animated/anxious affect and mood. Pt. appears watchful of environment. Ongoing c/o of gas.  Subjective- Denies having any SI/HI/AVH at this time. Rates pain 6/10; L side of abdomen. Is cooperative and remain safe on the unit.  1:1 interaction in private to establish rapport. Encouragement, education, & support given from staff. PRN Tylenol, Trazodone, and Mylicon requested and will re-eval accordingly. BS elevated at 290 at bedtime.   Safety maintained with Q 15 checks. Continues to follow treatment plan and will monitor closely. No additonal questions/concerns noted.

## 2016-10-10 NOTE — Progress Notes (Signed)
Patient ID: Lucas Small, male   DOB: Jan 01, 1966, 51 y.o.   MRN: [id number]  Writer was called by lab this morning Freda Aubuchon) and notified that patient's stool culture was not accepted as the way it came to lab was not what was ordered. Freda Mack reported she would send the appropriate specimen container. Writer never received this and therefore called lab. They reported Freda Freeland had left for today but lab would accept specimen in a sterile urine specimen cup. Writer repeated and again lab verbalized this. Patient was provided with this sterile cup and a specimen was requested.

## 2016-10-10 NOTE — [facility] Group Notes (Signed)
[place] LCSW Group Therapy 10/10/2016 1:15 PM  Type of Therapy: Group Therapy- Emotion Regulation  Participation Level: Pt invited. Did not attend.   Georga Kaufmann, MSW, LCSWA 10/10/2016 4:44 PM

## 2016-10-10 NOTE — Progress Notes (Signed)
Adult Psychoeducational Group Note  Date:  10/10/2016 Time:  9:26 PM  Group Topic/Focus:  Wrap-Up Group:   The focus of this group is to help patients review their daily goal of treatment and discuss progress on daily workbooks.  Participation Level:  Active  Participation Quality:  Appropriate and Attentive  Affect:  Appropriate  Cognitive:  Appropriate  Insight: Appropriate  Engagement in Group:  Engaged  Modes of Intervention:  Discussion  Additional Comments:  Pt stated his goal was to get more sleep, because sleep is very important. Pt stated 2 coping skills are understanding his meds for his diagnosis of bipolar and getting appropriate amounts of sleep.  [name] 10/10/2016, 9:26 PM

## 2016-10-11 LAB — GLUCOSE, CAPILLARY
GLUCOSE-CAPILLARY: 159 mg/dL — AB (ref 65–99)
GLUCOSE-CAPILLARY: 257 mg/dL — AB (ref 65–99)
Glucose-Capillary: 121 mg/dL — ABNORMAL HIGH (ref 65–99)
Glucose-Capillary: 207 mg/dL — ABNORMAL HIGH (ref 65–99)

## 2016-10-11 LAB — STOOL CULTURE

## 2016-10-11 NOTE — Progress Notes (Signed)
[facility] MD Progress Note  10/11/2016 12:29 PM Lucas Small  MRN:  [id number]   Subjective: at this time patient reports partial improvement. States that he is feeling less depressed, but still anxious. Denies suicidal ideations . Better able to discuss disposition planning without becoming apprehensive .   Objective: I have discussed case with treatment team and have met with patient. Presents with improving mood and range of affect. Remains vaguely constricted but affect is more reactive, and improves during session. He denies suicidal ideations. He is tolerating medications well , denies side effects. GI symptoms, which are chronic, intermittent ( flatulence, diarrhea) are improving and he is less somatically focused at this time. No disruptive or agitated behaviors at this time. Visible in day room Slept better last night.      Principal Problem: MDD (major depressive disorder), recurrent severe, without psychosis ([place]) Diagnosis:   Patient Active Problem List   Diagnosis Date Noted  . MDD (major depressive disorder), recurrent severe, without psychosis ([place]) [F33.2] 10/05/2016  . OSA (obstructive sleep apnea) [G47.33] 04/25/2016  . Back pain [M54.9] 12/15/2015  . GERD (gastroesophageal reflux disease) [K21.9] 12/15/2015  . DM (diabetes mellitus) type II uncontrolled, periph vascular disorder ([place]) [E11.51, E11.65] 12/15/2015  . Hyperlipidemia LDL goal <70 [E78.5] 12/15/2015  . Diverticulitis [K57.92] 03/05/2013  . Tobacco dependency [F17.200] 03/05/2013  . DOE (dyspnea on exertion) [R06.09] 03/05/2013  . Major depression, recurrent ([place]) [F33.9] 11/24/2012  . Snoring disorder 08/27/2011  . Substance induced mood disorder ([place]) [F19.94] 08/23/2011    Class: Acute  . Alcohol dependency ([place]) [F10.20] 08/23/2011    Class: Acute   Total Time spent with patient: 20 minutes  Past Psychiatric History: see H&P  Past Medical History:  Past Medical History:  Diagnosis Date  .  Alcoholic ([place])   . CHF (congestive heart failure) ([place])   . Depression   . Diabetes mellitus without complication (HCC)    metformin  . Dyspnea    with exertion .  Marland Kitchen Dysrhythmia    while hospitalized in [place]  . History of kidney stones   . Hypertension   . MRSA (methicillin resistant Staphylococcus aureus)   . Multiple myeloma ([place]) 1990's  . Perforation bowel ([place])   . PNA (pneumonia)   . Renal disorder   . Renal failure   . Renal insufficiency   . Respiratory failure ([place])   . Sickle cell anemia (HCC)   . Sleep apnea    no CPAP machine    Past Surgical History:  Procedure Laterality Date  . ABDOMINAL SURGERY    . CARDIAC SURGERY    . HERNIA REPAIR    . ILEOSTOMY    . PERIPHERALLY INSERTED CENTRAL CATHETER INSERTION     Family History: History reviewed. No pertinent family history. Family Psychiatric  History: see H&P Social History:  History  Alcohol Use  . Yes    Comment: occasionally     History  Drug Use No    Comment: THC cocaine    Social History   Social History  . Marital status: Divorced    Spouse name: N/A  . Number of children: N/A  . Years of education: N/A   Occupational History  .      disabled   Social History Main Topics  . Smoking status: Former Smoker    Packs/day: 1.00    Years: 28.00    Types: Cigarettes    Quit date: 08/28/2016  . Smokeless tobacco: Never Used  . Alcohol use  Yes     Comment: occasionally  . Drug use: No     Comment: THC cocaine  . Sexual activity: Yes     Comment: Not asked   Other Topics Concern  . None   Social History Narrative   Exercise---  Walking some --  Not enough   Additional Social History:   Sleep: fair- states only partially improved on current Trazodone dose   Appetite: improving   Current Medications: Current Facility-Administered Medications  Medication Dose Route Frequency Provider Last Rate Last Dose  . acetaminophen (TYLENOL) tablet 650 mg  650 mg Oral Q6H PRN Kerrie Buffalo, NP   650 mg at 10/10/16 2120  . alum & mag hydroxide-simeth (MAALOX/MYLANTA) 200-200-20 MG/5ML suspension 30 mL  30 mL Oral Q4H PRN Kerrie Buffalo, NP   30 mL at 10/08/16 2039  . DULoxetine (CYMBALTA) DR capsule 40 mg  40 mg Oral Daily Izediuno, Laruth Bouchard, MD   40 mg at 10/11/16 0820  . gabapentin (NEURONTIN) capsule 300 mg  300 mg Oral TID Artist Beach, MD   300 mg at 10/11/16 1208  . insulin aspart (novoLOG) injection 0-15 Units  0-15 Units Subcutaneous TID WC Lindell Spar I, NP   2 Units at 10/11/16 1211  . insulin aspart (novoLOG) injection 3 Units  3 Units Subcutaneous TID WC Lindell Spar I, NP   3 Units at 10/11/16 1211  . loperamide (IMODIUM) capsule 2 mg  2 mg Oral PRN Donne Hazel, MD      . metFORMIN (GLUCOPHAGE) tablet 500 mg  500 mg Oral BID WC Kerrie Buffalo, NP   500 mg at 10/11/16 0820  . multivitamin with minerals tablet 1 tablet  1 tablet Oral Daily Lindell Spar I, NP   1 tablet at 10/11/16 0820  . nicotine (NICODERM CQ - dosed in mg/24 hours) patch 21 mg  21 mg Transdermal Daily Izediuno, Laruth Bouchard, MD   21 mg at 10/11/16 0821  . simethicone (MYLICON) chewable tablet 80 mg  80 mg Oral Q6H PRN Benjamine Mola, FNP   80 mg at 10/11/16 0612  . thiamine (B-1) injection 100 mg  100 mg Intramuscular Once Nwoko, Herbert Pun I, NP      . thiamine (VITAMIN B-1) tablet 100 mg  100 mg Oral Daily Nwoko, Agnes I, NP   100 mg at 10/11/16 0820  . traZODone (DESYREL) tablet 150 mg  150 mg Oral QHS PRN Sathvik Tiedt, Myer Peer, MD   150 mg at 10/10/16 2120   Facility-Administered Medications Ordered in Other Encounters  Medication Dose Route Frequency Provider Last Rate Last Dose  . folic acid (FOLVITE) tablet 1 mg  1 mg Oral Daily Rankin, Shuvon B, NP      . haloperidol (HALDOL) tablet 5 mg  5 mg Oral Q6H PRN Rankin, Shuvon B, NP      . magnesium hydroxide (MILK OF MAGNESIA) suspension 30 mL  30 mL Oral Daily PRN Rankin, Shuvon B, NP        Lab Results:  Results for orders placed or  performed during the hospital encounter of 10/03/16 (from the past 48 hour(s))  Glucose, capillary     Status: Abnormal   Collection Time: 10/09/16  5:23 PM  Result Value Ref Range   Glucose-Capillary 225 (H) 65 - 99 mg/dL  Glucose, capillary     Status: Abnormal   Collection Time: 10/09/16  8:43 PM  Result Value Ref Range   Glucose-Capillary 242 (H) 65 - 99 mg/dL  Comment 1 Notify RN   Glucose, capillary     Status: Abnormal   Collection Time: 10/10/16  5:46 AM  Result Value Ref Range   Glucose-Capillary 195 (H) 65 - 99 mg/dL   Comment 1 Notify RN   Glucose, capillary     Status: Abnormal   Collection Time: 10/10/16 12:01 PM  Result Value Ref Range   Glucose-Capillary 193 (H) 65 - 99 mg/dL  Glucose, capillary     Status: Abnormal   Collection Time: 10/10/16  5:07 PM  Result Value Ref Range   Glucose-Capillary 242 (H) 65 - 99 mg/dL  Glucose, capillary     Status: Abnormal   Collection Time: 10/10/16  9:01 PM  Result Value Ref Range   Glucose-Capillary 290 (H) 65 - 99 mg/dL   Comment 1 Notify RN   Glucose, capillary     Status: Abnormal   Collection Time: 10/11/16  5:39 AM  Result Value Ref Range   Glucose-Capillary 159 (H) 65 - 99 mg/dL  Glucose, capillary     Status: Abnormal   Collection Time: 10/11/16 12:07 PM  Result Value Ref Range   Glucose-Capillary 121 (H) 65 - 99 mg/dL   Comment 1 Notify RN    Comment 2 Document in Chart     Blood Alcohol level:  Lab Results  Component Value Date   ETH 246 (H) 10/02/2016   ETH <5 05/30/7251    Metabolic Disorder Labs: Lab Results  Component Value Date   HGBA1C 6.9 (H) 02/13/2016   No results found for: PROLACTIN Lab Results  Component Value Date   CHOL 158 02/13/2016   TRIG 143.0 02/13/2016   HDL 35.20 (L) 02/13/2016   CHOLHDL 4 02/13/2016   VLDL 28.6 02/13/2016   LDLCALC 94 02/13/2016   LDLCALC 165 (H) 12/05/2015    Physical Findings: AIMS: Facial and Oral Movements Muscles of Facial Expression: None,  normal Lips and Perioral Area: None, normal Jaw: None, normal Tongue: None, normal,Extremity Movements Upper (arms, wrists, hands, fingers): None, normal Lower (legs, knees, ankles, toes): None, normal, Trunk Movements Neck, shoulders, hips: None, normal, Overall Severity Severity of abnormal movements (highest score from questions above): None, normal Incapacitation due to abnormal movements: None, normal Patient's awareness of abnormal movements (rate only patient's report): No Awareness, Dental Status Current problems with teeth and/or dentures?: No Does patient usually wear dentures?: No  CIWA:  CIWA-Ar Total: 1 COWS:  COWS Total Score: 3  Musculoskeletal: Strength & Muscle Tone: within normal limits Gait & Station: normal Patient leans: N/A  Psychiatric Specialty Exam: Physical Exam  Vitals reviewed. Constitutional: He is oriented to person, place, and time. He appears well-developed.  Cardiovascular: Normal rate.   Neurological: He is alert and oriented to person, place, and time.  Psychiatric: He has a normal mood and affect. His behavior is normal.      Psychiatric Specialty Exam: Physical Exam  Vitals reviewed. Constitutional: He is oriented to person, place, and time. He appears well-developed.  Cardiovascular: Normal rate.   Neurological: He is alert and oriented to person, place, and time.  Psychiatric: He has a normal mood and affect. His behavior is normal.    Review of Systems  Gastrointestinal:       Severe flatulence   Psychiatric/Behavioral: Positive for depression and substance abuse. Negative for hallucinations and suicidal ideas. The patient is nervous/anxious and has insomnia.   All other systems reviewed and are negative. Improving flatulence,distension, improved diarrhea, no vomiting   Blood pressure 110/69, pulse Marland Kitchen)  102, temperature 98.7 F (37.1 C), temperature source Oral, resp. rate 18, height 5' 8.5" (1.74 m), weight 95 kg (209 lb 8  oz).Body mass index is 31.39 kg/m.  General Appearance: improved grooming   Eye Contact:  Good  Speech:  Normal Rate  Volume:  Normal  Mood:  improved mood , less depressed  Affect:  less constricted, remains anxious   Thought Process:  Goal Directed and Descriptions of Associations: Intact  Orientation:  Full (Time, Place, and Person)  Thought Content:  No hallucinations, no delusions, not internally preoccupied   Suicidal Thoughts:  No denies suicidal plan or intention , contracts for safety on unit, denies homicidal or violent ideations   Homicidal Thoughts:  No  Memory:  Recent and remote grossly intact   Judgement: fair- improving  Insight:  Fair- improving   Psychomotor Activity:  Normal- no longer using walker to mobilize   Concentration:  Concentration: Good and Attention Span: Good  Recall:  Good  Fund of Knowledge:  Good  Language:  Good  Akathisia:  No  Handed:    AIMS (if indicated):     Assets:  Communication Skills Desire for Improvement Resilience  ADL's:  Intact  Cognition:  WNL  Sleep:  Number of Hours: 6     Review of Systems  Gastrointestinal:       Severe flatulence   Psychiatric/Behavioral: Positive for depression and substance abuse. Negative for hallucinations and suicidal ideas. The patient is nervous/anxious and has insomnia.   All other systems reviewed and are negative.    Assessment- patient continues to improve gradually- presents with improving mood, anxiety is present but has also tended to decrease. No suicidal ideations . Tolerating medications well, slept better on increased Trazodone .   Treatment Plan Summary:  Treatment plan reviewed as below today 5/17  -Continue to encourage group and milieu participation -Continue to encourage efforts to maintain sobriety and work on relapse prevention  -Continue Cymbalta '40mg'$  po daily for depression/pain -Continue Neurontin '300mg'$  po tid for anxiety/ETOH -Continue Trazodone to 150 mg po qhs prn  insomnia -Continue Simethicone chewable tabs '80mg'$  q6h prn flatulence -Treatment team working on disposition planning  ( Of note, patient reports that at home, particularly when he preferentially eats meat products, his flatulence improves dramatically- have recommended that he see his PCP , obtain referral to a GI Specialist , in order to be worked up for potential food allergy or Gluten intolerance )  Jenne Campus, MD 10/11/2016, 12:29 PM    Patient ID: [name], male   DOB: May 16, 1966, 51 y.o.   MRN: [id number]

## 2016-10-11 NOTE — BHH Group Notes (Signed)
[place] Group Notes:  Leisure and Lifestyle Changes  Date:  10/11/2016  Time:  4:07 PM  Type of Therapy:  Psychoeducational Skills  Participation Level:  Active  Participation Quality:  Appropriate and Attentive  Affect:  Appropriate  Cognitive:  Alert and Appropriate  Insight:  Appropriate  Engagement in Group:  Engaged and Supportive  Modes of Intervention:  Discussion and Education  Summary of Progress/Problems: Patient attended group late as he was with MD Cobos. He reports that his goal is to tie up loose ends before discharge tomorrow. He reports that he made a list because he is forgetful and needs to check off this to do list.   Gaylan Gerold E 10/11/2016, 4:07 PM

## 2016-10-11 NOTE — Progress Notes (Signed)
Nutrition Education Note  Pt attended group focusing on general, healthful nutrition education.  RD emphasized the importance of eating regular meals and snacks throughout the day. Consuming sugar-free beverages and incorporating fruits and vegetables into diet when possible. Provided examples of healthy snacks. Patient encouraged to leave group with a goal to improve nutrition/healthy eating.   Diet Order: Diet regular Room service appropriate? Yes; Fluid consistency: Thin Pt is also offered choice of unit snacks mid-morning and mid-afternoon.  Pt is eating as desired.   If additional nutrition issues arise, please consult RD.    Jarome Matin, MS, RD, LDN, [facility] Inpatient Clinical Dietitian Pager # [phone] After hours/weekend pager # [phone]

## 2016-10-11 NOTE — BHH Group Notes (Signed)
[place] LCSW Group Therapy 10/11/2016 1:15pm  Type of Therapy: Group Therapy- Balance in Life  Participation Level: Active   Description of the Group:  The topic for group was balance in life. Today's group focused on defining balance in one's own words, identifying things that can knock one off balance, and exploring healthy ways to maintain balance in life. Group members were asked to provide an example of a time when they felt off balance, describe how they handled that situation,and process healthier ways to regain balance in the future. Group members were asked to share the most important tool for maintaining balance that they learned while at [facility] and how they plan to apply this method after discharge.  Summary of Patient Progress Pt states that he feels mostly unbalance right now. Pt reports that he has a good number of supports in his life and he is looking forward to getting back in touch with them after he leaves the hospital. Pt stated that having supports is something that helps him feel more balanced.    Therapeutic Modalities:   Cognitive Behavioral Therapy Solution-Focused Therapy Assertiveness Training   Georga Kaufmann, MSW, [place] 10/11/2016 3:27 PM

## 2016-10-11 NOTE — BHH Suicide Risk Assessment (Signed)
[facility] INPATIENT:  Family/Significant Other Suicide Prevention Education  Suicide Prevention Education:  Patient Refusal for Family/Significant Other Suicide Prevention Education: The patient Lucas Small has refused to provide written consent for family/significant other to be provided Family/Significant Other Suicide Prevention Education during admission and/or prior to discharge.  Physician notified.  Pt declines family contact at this time. SPE discussed with the pt.  Georga Kaufmann, MSW, LCSWA 10/11/2016, 10:36 AM

## 2016-10-11 NOTE — Progress Notes (Signed)
Patient ID: Lucas Small, male   DOB: 21-Nov-1965, 51 y.o.   MRN: [id number]  Rollene Fare from [employer] contacted writer this afternoon reporting that patient's stool sample was not accepted as it was not in the appropriate specimen cup. Writer informed MD Cobos. Per MD Cobos another specimen is not needed to be collected.

## 2016-10-11 NOTE — Progress Notes (Signed)
Patient ID: Lucas Small, male   DOB: [birth year], 51 y.o.   MRN: [id number]  DAR: Pt. Denies SI/HI and A/V Hallucinations. He reports sleep is good, appetite is fair, energy level is low, and concentration is poor. He rates depression 6/10, hopelessness 5/10, and anxiety 7/10.  Patient does report some abdominal pain but refused intervention as he reports, "I can live with a 5." Support and encouragement provided to the patient. Scheduled medications administered to patient per physician's orders. Patient is seen in the milieu and is interacting with peers. He is attending groups and following his plan of care. He reports that he feels ready for discharge tomorrow. His affect is animated and mood is pleasant. Q15 minute checks are maintained for safety.

## 2016-10-12 ENCOUNTER — Telehealth: Payer: Self-pay | Admitting: Family Medicine

## 2016-10-12 LAB — GLUCOSE, CAPILLARY: GLUCOSE-CAPILLARY: 177 mg/dL — AB (ref 65–99)

## 2016-10-12 MED ORDER — DULOXETINE HCL 40 MG PO CPEP: 40.0000 mg | ORAL_CAPSULE | Freq: Every day | ORAL | 0 refills | Status: DC

## 2016-10-12 MED ORDER — GABAPENTIN 300 MG PO CAPS: 300.0000 mg | ORAL_CAPSULE | Freq: Three times a day (TID) | ORAL | 0 refills | Status: DC

## 2016-10-12 MED ORDER — METFORMIN HCL 500 MG PO TABS: 500.0000 mg | ORAL_TABLET | Freq: Two times a day (BID) | ORAL | 0 refills | Status: DC

## 2016-10-12 MED ORDER — NICOTINE 21 MG/24HR TD PT24: 21.0000 mg | MEDICATED_PATCH | Freq: Every day | TRANSDERMAL | 0 refills | Status: DC

## 2016-10-12 MED ORDER — TRAZODONE HCL 150 MG PO TABS: 150.0000 mg | ORAL_TABLET | Freq: Every evening | ORAL | 0 refills | Status: DC | PRN

## 2016-10-12 NOTE — Progress Notes (Signed)
  [facility] Adult Case Management Discharge Plan :  Will you be returning to the same living situation after discharge:  Yes,  pt returning home. At discharge, do you have transportation home?: Yes,  pt has access to transportation. Do you have the ability to pay for your medications: Yes,  pt has insurance.  Release of information consent forms completed and in the chart;  Patient's signature needed at discharge.  Patient to Follow up at: Follow-up Information    [facility] up.   Specialty:  Behavioral Health Why:  In the event that you are unable to schedule a timely appointment with a provider, please go to [facility] for a walk-in appointment. Walk-in hours are Mon-Fri 8 AM-3 PM. Please arrive as early as possible to be sure that you are seen. Contact information: [place] [address] [phone]        Patient wishes to schedule his own mental health follow-up appointments Follow up.   Why:  Patient would like to set up his own follow-up appointments. Social worker provided pt with a list of outpatient providers.          Next level of care provider has access to [place] and Suicide Prevention discussed: Yes,  with pt.  Have you used any form of tobacco in the last 30 days? (Cigarettes, Smokeless Tobacco, Cigars, and/or Pipes): Yes  Has patient been referred to the Quitline?: Patient refused referral  Patient has been referred for addiction treatment: Yes   Pt declined CSW assistance with appointments. Pt was provided with a list of outpatient providers and would like to make the appointment on his own.  Georga Kaufmann, MSW, LCSWA  10/12/2016, 10:13 AM

## 2016-10-12 NOTE — Telephone Encounter (Signed)
Relation to KO:ECXF Call back [phone]   Reason for call:  Patient wanted to inform PCP he was seen at the ED 10/03/16 due to depression and UDS was taken at the time, patient wanted PCP to be aware, please advise

## 2016-10-12 NOTE — Tx Team (Signed)
Interdisciplinary Treatment and Diagnostic Plan Update  10/12/2016 Time of Session: 0930 Lucas Small MRN: [id number]  Principal Diagnosis: Substance Induced Mood Disorder  Secondary Diagnoses: Principal Problem:   MDD (major depressive disorder), recurrent severe, without psychosis ([place])   Current Medications:  Current Facility-Administered Medications  Medication Dose Route Frequency Provider Last Rate Last Dose  . acetaminophen (TYLENOL) tablet 650 mg  650 mg Oral Q6H PRN Kerrie Buffalo, NP   650 mg at 10/11/16 2144  . alum & mag hydroxide-simeth (MAALOX/MYLANTA) 200-200-20 MG/5ML suspension 30 mL  30 mL Oral Q4H PRN Kerrie Buffalo, NP   30 mL at 10/08/16 2039  . DULoxetine (CYMBALTA) DR capsule 40 mg  40 mg Oral Daily Izediuno, Laruth Bouchard, MD   40 mg at 10/12/16 0855  . gabapentin (NEURONTIN) capsule 300 mg  300 mg Oral TID Artist [place], MD   300 mg at 10/12/16 0855  . insulin aspart (novoLOG) injection 0-15 Units  0-15 Units Subcutaneous TID WC Lindell Spar I, NP   3 Units at 10/12/16 [phone]  . insulin aspart (novoLOG) injection 3 Units  3 Units Subcutaneous TID WC Lindell Spar I, NP   3 Units at 10/12/16 0615  . loperamide (IMODIUM) capsule 2 mg  2 mg Oral PRN Donne Hazel, MD      . metFORMIN (GLUCOPHAGE) tablet 500 mg  500 mg Oral BID WC Kerrie Buffalo, NP   500 mg at 10/12/16 0856  . multivitamin with minerals tablet 1 tablet  1 tablet Oral Daily Lindell Spar I, NP   1 tablet at 10/12/16 0855  . nicotine (NICODERM CQ - dosed in mg/24 hours) patch 21 mg  21 mg Transdermal Daily Izediuno, Laruth Bouchard, MD   21 mg at 10/12/16 0856  . simethicone (MYLICON) chewable tablet 80 mg  80 mg Oral Q6H PRN Benjamine Mola, FNP   80 mg at 10/12/16 0855  . thiamine (B-1) injection 100 mg  100 mg Intramuscular Once Nwoko, Herbert Pun I, NP      . thiamine (VITAMIN B-1) tablet 100 mg  100 mg Oral Daily Nwoko, Agnes I, NP   100 mg at 10/12/16 0855  . traZODone (DESYREL) tablet 150 mg  150 mg  Oral QHS PRN Cobos, Myer Peer, MD   150 mg at 10/11/16 2143   Facility-Administered Medications Ordered in Other Encounters  Medication Dose Route Frequency Provider Last Rate Last Dose  . folic acid (FOLVITE) tablet 1 mg  1 mg Oral Daily Rankin, Shuvon B, NP      . haloperidol (HALDOL) tablet 5 mg  5 mg Oral Q6H PRN Rankin, Shuvon B, NP      . magnesium hydroxide (MILK OF MAGNESIA) suspension 30 mL  30 mL Oral Daily PRN Rankin, Shuvon B, NP       PTA Medications: Prescriptions Prior to Admission  Medication Sig Dispense Refill Last Dose  . aspirin EC 81 MG tablet Take 81 mg by mouth daily as needed.    Past Week at Unknown time  . escitalopram (LEXAPRO) 20 MG tablet Take 1 tablet (20 mg total) by mouth daily. 90 tablet 1 Past Week at Unknown time  . glucose blood (ONETOUCH VERIO) test strip Check Blood sugar twice daily. Dx:E11.9 100 each 12 Taking  . ibuprofen (ADVIL,MOTRIN) 200 MG tablet Take 200 mg by mouth as needed.   Past Week at Unknown time  . metFORMIN (GLUCOPHAGE) 500 MG tablet Take 1 tablet (500 mg total) by mouth 2 (two) times daily  with a meal. 180 tablet 1 Past Week at Unknown time  . Multiple Vitamin (MULTIVITAMIN WITH MINERALS) TABS tablet Take 1 tablet by mouth daily.   Past Week at Unknown time  . NONFORMULARY OR COMPOUNDED ITEM Abdominal binder dx abd hernias 1 each 1 Taking  . ONETOUCH DELICA LANCETS FINE MISC Check Blood sugar twice daily. Dx:E11.9 100 each 12 Taking  . oxyCODONE-acetaminophen (ROXICET) 5-325 MG tablet Take 1 tablet by mouth every 8 (eight) hours as needed for severe pain. 20 tablet 0 10/02/2016 at Unknown time  . pantoprazole (PROTONIX) 40 MG tablet Take 1 tablet (40 mg total) by mouth 2 (two) times daily. Reported on 07/11/2015 (Patient taking differently: Take 40 mg by mouth 2 (two) times daily. ) 180 tablet 1 Past Week at Unknown time  . polyethylene glycol (MIRALAX / GLYCOLAX) packet Take 17 g by mouth daily as needed for mild constipation. Reported on  07/11/2015 14 each 1 Past Week at Unknown time  . rosuvastatin (CRESTOR) 20 MG tablet TAKE 1 TABLET(20 MG) BY MOUTH DAILY 90 tablet 0 Past Week at Unknown time  . traZODone (DESYREL) 50 MG tablet Take 1 tablet (50 mg total) by mouth at bedtime as needed for sleep. 30 tablet 3 Past Week at Unknown time    Patient Stressors: Financial difficulties Health problems Loss of friends, lover, and family members to death Substance abuse  Patient Strengths: Ability for insight Capable of independent living Motivation for treatment/growth Supportive family/friends  Treatment Modalities: Medication Management, Group therapy, Case management,  1 to 1 session with clinician, Psychoeducation, Recreational therapy.   Physician Treatment Plan for Primary Diagnosis: MDD (major depressive disorder), recurrent severe, without psychosis ([place]) Long Term Goal(s): Improvement in symptoms so as ready for discharge Improvement in symptoms so as ready for discharge   Short Term Goals: Ability to identify changes in lifestyle to reduce recurrence of condition will improve Ability to verbalize feelings will improve Ability to disclose and discuss suicidal ideas Ability to demonstrate self-control will improve Ability to identify and develop effective coping behaviors will improve Ability to maintain clinical measurements within normal limits will improve Compliance with prescribed medications will improve Ability to identify triggers associated with substance abuse/mental health issues will improve Ability to identify changes in lifestyle to reduce recurrence of condition will improve Ability to verbalize feelings will improve Ability to disclose and discuss suicidal ideas Ability to demonstrate self-control will improve Ability to identify and develop effective coping behaviors will improve Ability to maintain clinical measurements within normal limits will improve Compliance with prescribed medications will  improve Ability to identify triggers associated with substance abuse/mental health issues will improve  Medication Management: Evaluate patient's response, side effects, and tolerance of medication regimen.  Therapeutic Interventions: 1 to 1 sessions, Unit Group sessions and Medication administration.  Evaluation of Outcomes: Adequate for Discharge  Physician Treatment Plan for Secondary Diagnosis: Principal Problem:   MDD (major depressive disorder), recurrent severe, without psychosis ([place])  Long Term Goal(s): Improvement in symptoms so as ready for discharge Improvement in symptoms so as ready for discharge   Short Term Goals: Ability to identify changes in lifestyle to reduce recurrence of condition will improve Ability to verbalize feelings will improve Ability to disclose and discuss suicidal ideas Ability to demonstrate self-control will improve Ability to identify and develop effective coping behaviors will improve Ability to maintain clinical measurements within normal limits will improve Compliance with prescribed medications will improve Ability to identify triggers associated with substance abuse/mental health issues  will improve Ability to identify changes in lifestyle to reduce recurrence of condition will improve Ability to verbalize feelings will improve Ability to disclose and discuss suicidal ideas Ability to demonstrate self-control will improve Ability to identify and develop effective coping behaviors will improve Ability to maintain clinical measurements within normal limits will improve Compliance with prescribed medications will improve Ability to identify triggers associated with substance abuse/mental health issues will improve     Medication Management: Evaluate patient's response, side effects, and tolerance of medication regimen.  Therapeutic Interventions: 1 to 1 sessions, Unit Group sessions and Medication administration.  Evaluation of Outcomes:  Adequate for Discharge   RN Treatment Plan for Primary Diagnosis: MDD (major depressive disorder), recurrent severe, without psychosis ([place]) Long Term Goal(s): Knowledge of disease and therapeutic regimen to maintain health will improve  Short Term Goals: Ability to remain free from injury will improve, Ability to verbalize feelings will improve and Ability to disclose and discuss suicidal ideas  Medication Management: RN will administer medications as ordered by provider, will assess and evaluate patient's response and provide education to patient for prescribed medication. RN will report any adverse and/or side effects to prescribing provider.  Therapeutic Interventions: 1 on 1 counseling sessions, Psychoeducation, Medication administration, Evaluate responses to treatment, Monitor vital signs and CBGs as ordered, Perform/monitor CIWA, COWS, AIMS and Fall Risk screenings as ordered, Perform wound care treatments as ordered.  Evaluation of Outcomes: Adequate for Discharge   LCSW Treatment Plan for Primary Diagnosis: MDD (major depressive disorder), recurrent severe, without psychosis ([place]) Long Term Goal(s): Safe transition to appropriate next level of care at discharge, Engage patient in therapeutic group addressing interpersonal concerns.  Short Term Goals: Engage patient in aftercare planning with referrals and resources, Facilitate patient progression through stages of change regarding substance use diagnoses and concerns and Identify triggers associated with mental health/substance abuse issues  Therapeutic Interventions: Assess for all discharge needs, 1 to 1 time with Social worker, Explore available resources and support systems, Assess for adequacy in community support network, Educate family and significant other(s) on suicide prevention, Complete Psychosocial Assessment, Interpersonal group therapy.  Evaluation of Outcomes: Adequate for Discharge   Progress in  Treatment: Attending groups: Yes. Participating in groups: Yes. Taking medication as prescribed: Yes. Toleration medication: Yes. Family/Significant other contact made: No, pt declined consent. Patient understands diagnosis: Yes. Discussing patient identified problems/goals with staff: Yes. Medical problems stabilized or resolved: Yes. Denies suicidal/homicidal ideation: Yes Issues/concerns per patient self-inventory: No. Other: n/a   New problem(s) identified: Pt continues to present as irritable; demanding; med seeking.   New Short Term/Long Term Goal(s): detox; medication stabilization; elimination of SI thoughts and VH, and development of comprehensive mental wellness/sobriety plan. 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   Discharge Plan or Barriers: 10/12/16 Pt will return home and follow up with an outpatient provider of his choosing. CSW provided pt with a list of outpatient provider per pt request.  Reason for Continuation of Hospitalization:  Estimated Length of Stay: 0 days   Attendees: Patient: 10/12/2016 9:58 AM  Physician: Dr. Neita Garnet, MD 10/12/2016 9:58 AM  Nursing: Benjamine Mola RN; [place], RN 10/12/2016 9:58 AM  RN Care Manager: Lars Pinks CM 10/12/2016 9:58 AM  Social Worker: Matthew Saras, Pablo 10/12/2016 9:58 AM  Recreational Therapist: x 10/12/2016 9:58 AM  Other: Lindell Spar NP 10/12/2016 9:58 AM  Other:  10/12/2016 9:58 AM  Other: 10/12/2016 9:58  AM    Scribe for Treatment Team: Georga Kaufmann, Latanya Presser 10/12/2016 9:58 AM

## 2016-10-12 NOTE — Progress Notes (Signed)
Recreation Therapy Notes  Date: 10/12/16 Time: 0930 Location: [address] Dayroom  Group Topic: Stress Management  Goal Area(s) Addresses:  Patient will verbalize importance of using healthy stress management.  Patient will identify positive emotions associated with healthy stress management.   Behavioral Response: Engaged  Intervention: Stress Management  Activity :  Guided Imagery.  LRT introduced the stress management technique of guided imagery.  LRT read a script to guide patients and engage them in the technique.  Patients were to follow along as LRT read script to fully participate in the technique.  Education:  Stress Management, Discharge Planning.   Education Outcome: Acknowledges edcuation/In group clarification offered/Needs additional education  Clinical Observations/Feedback: Pt attended group.   Victorino Sparrow, LRT/CTRS         Victorino Sparrow A 10/12/2016 12:16 PM

## 2016-10-12 NOTE — Progress Notes (Signed)
D pt is readied for dc as Probation officer reviews all dc instrucitons with him. After he verbalizes understanding and willingness to comply, all belongings in locker are returned to him and then he is given patient survey to complete and then he is given prescriptions and then, as he leaves , he is given cc of all dc instrucitons ( SRA, AVS, SSP and SRA). He completed his daily assessment earleir today, writes on this he deneis having SI today and he rates his depression, hopelessness and anxeity " 5/6/6", respectively. Safety in place.

## 2016-10-12 NOTE — Telephone Encounter (Signed)
He was supposed to have labs done too---

## 2016-10-12 NOTE — Progress Notes (Signed)
Nursing Progress Note: 7p-7a D: Pt currently presents with a depressed affect and behavior. Pt states "I had a good day. Ready to go home." Interacting appropriately with milieu. Pt reports good sleep with current medication regimen.   A: Pt provided with medications per providers orders. Pt's labs and vitals were monitored throughout the night. Pt supported emotionally and encouraged to express concerns and questions. Pt educated on medications.  R: Pt's safety ensured with 15 minute and environmental checks. Pt currently denies SI/HI/Self Harm and AVH. Pt verbally contracts to seek staff if SI/HI or A/VH occurs and to consult with staff before acting on any harmful thoughts. Will continue to monitor.

## 2016-10-12 NOTE — [facility] Suicide Risk Assessment (Signed)
[facility] Discharge Suicide Risk Assessment   Principal Problem: MDD (major depressive disorder), recurrent severe, without psychosis ([place]) Discharge Diagnoses:  Patient Active Problem List   Diagnosis Date Noted  . MDD (major depressive disorder), recurrent severe, without psychosis ([place]) [F33.2] 10/05/2016  . OSA (obstructive sleep apnea) [G47.33] 04/25/2016  . Back pain [M54.9] 12/15/2015  . GERD (gastroesophageal reflux disease) [K21.9] 12/15/2015  . DM (diabetes mellitus) type II uncontrolled, periph vascular disorder ([place]) [E11.51, E11.65] 12/15/2015  . Hyperlipidemia LDL goal <70 [E78.5] 12/15/2015  . Diverticulitis [K57.92] 03/05/2013  . Tobacco dependency [F17.200] 03/05/2013  . DOE (dyspnea on exertion) [R06.09] 03/05/2013  . Major depression, recurrent ([place]) [F33.9] 11/24/2012  . Snoring disorder 08/27/2011  . Substance induced mood disorder ([place]) [F19.94] 08/23/2011    Class: Acute  . Alcohol dependency ([place]) [F10.20] 08/23/2011    Class: Acute    Total Time spent with patient: 30 minutes  Musculoskeletal: Strength & Muscle Tone: within normal limits Gait & Station: normal Patient leans: N/A  Psychiatric Specialty Exam: ROS  Denies headache, no chest pain , no shortness of breath, improved diarrhea, improved flatulence   Blood pressure 103/71, pulse (!) 102, temperature 99.1 F (37.3 C), temperature source Oral, resp. rate 18, height 5' 8.5" (1.74 m), weight 95 kg (209 lb 8 oz).Body mass index is 31.39 kg/m.  General Appearance: Well Groomed  Eye Contact::  Good  Speech:  Normal Rate409  Volume:  Normal  Mood:  improving mood, states he feels better than on admission  Affect:  Appropriate and more reactive   Thought Process:  Linear  Orientation:  Full (Time, Place, and Person)  Thought Content:  denies hallucinations, no delusions, not internally preoccupied   Suicidal Thoughts:  No denies any suicidal or self injurious ideations   Homicidal Thoughts:  No denies  homicidal ideations  Memory:  recent and remote grossly intact   Judgement:  Other:  improving   Insight:  improving   Psychomotor Activity:  Normal  Concentration:  Good  Recall:  Good  Fund of Knowledge:Good  Language: Good  Akathisia:  Negative  Handed:  Right  AIMS (if indicated):     Assets:  Communication Skills Desire for Improvement Resilience  Sleep:  Number of Hours: 6.75  Cognition: WNL  ADL's:  Intact   Mental Status Per Nursing Assessment::   On Admission:  Suicidal ideation indicated by patient, Self-harm thoughts, Self-harm behaviors  Demographic Factors:  51 years old , divorced, lives alone , disability  Loss Factors: Disability   Historical Factors: History of depression, history of alcohol dependence  Risk Reduction Factors:   Positive coping skills or problem solving skills  Continued Clinical Symptoms:  At this time patient is improved compared to admission , presents alert, attentive, well groomed, calm, reports feeling better , mood is improved, affect is brighter, more reactive , no thought disorder, no suicidal or self injurious ideations, no psychotic symptoms, future oriented . Denies medication side effects Behavior on unit in good control.   Cognitive Features That Contribute To Risk:  No gross cognitive deficits noted upon discharge. Is alert , attentive, and oriented x 3    Suicide Risk:  Mild:  Suicidal ideation of limited frequency, intensity, duration, and specificity.  There are no identifiable plans, no associated intent, mild dysphoria and related symptoms, good self-control (both objective and subjective assessment), few other risk factors, and identifiable protective factors, including available and accessible social support.  Follow-up Information    [facility] Follow up.  Specialty:  Behavioral Health Why:  In the event that you are unable to schedule a timely appointment with a provider, please go to [facility] for a walk-in  appointment. Walk-in hours are Mon-Fri 8 AM-3 PM. Please arrive as early as possible to be sure that you are seen. Contact information: [place] [address] [phone]        Patient wishes to schedule his own mental health follow-up appointments Follow up.   Why:  Patient would like to set up his own follow-up appointments. Social worker provided pt with a list of outpatient providers.          Plan Of Care/Follow-up recommendations:  Activity:  as tolerated  Diet:  Diabetic Diet  Tests:  NA Other:  See below  Patient is leaving unit in good spirits . Patient is going to be living with ex wife, with whom he has a good relationship, after discharge Follow up as above Plans to follow up with Dr. Cheri Rous, his PCP, for ongoing medical management as needed   Jenne Campus, MD 10/12/2016, 10:51 AM

## 2016-10-13 NOTE — Discharge Summary (Signed)
Physician Discharge Summary Note  Patient:  Lucas Small is an 51 y.o., male MRN:  [id number] DOB:  [birth year] Patient phone:  [phone] (home)  Patient address:   [place] [address],  Total Time spent with patient: 30 minutes  Date of Admission:  10/03/2016 Date of Discharge: 10/13/2016  Reason for Admission:  ETOH abuse  Principal Problem: MDD (major depressive disorder), recurrent severe, without psychosis [facility]) Discharge Diagnoses: Patient Active Problem List   Diagnosis Date Noted  . MDD (major depressive disorder), recurrent severe, without psychosis ([place]) [F33.2] 10/05/2016  . OSA (obstructive sleep apnea) [G47.33] 04/25/2016  . Back pain [M54.9] 12/15/2015  . GERD (gastroesophageal reflux disease) [K21.9] 12/15/2015  . DM (diabetes mellitus) type II uncontrolled, periph vascular disorder ([place]) [E11.51, E11.65] 12/15/2015  . Hyperlipidemia LDL goal <70 [E78.5] 12/15/2015  . Diverticulitis [K57.92] 03/05/2013  . Tobacco dependency [F17.200] 03/05/2013  . DOE (dyspnea on exertion) [R06.09] 03/05/2013  . Major depression, recurrent ([place]) [F33.9] 11/24/2012  . Snoring disorder 08/27/2011  . Substance induced mood disorder ([place]) [F19.94] 08/23/2011    Class: Acute  . Alcohol dependency ([place]) [F10.20] 08/23/2011    Class: Acute    Past Psychiatric History: see HPI  Past Medical History:  Past Medical History:  Diagnosis Date  . Alcoholic ([place])   . CHF (congestive heart failure) ([place])   . Depression   . Diabetes mellitus without complication (HCC)    metformin  . Dyspnea    with exertion .  Marland Kitchen Dysrhythmia    while hospitalized in [place]  . History of kidney stones   . Hypertension   . MRSA (methicillin resistant Staphylococcus aureus)   . Multiple myeloma ([place]) 1990's  . Perforation bowel ([place])   . PNA (pneumonia)   . Renal disorder   . Renal failure   . Renal insufficiency   . Respiratory failure ([place])   . Sickle cell anemia  (HCC)   . Sleep apnea    no CPAP machine    Past Surgical History:  Procedure Laterality Date  . ABDOMINAL SURGERY    . CARDIAC SURGERY    . HERNIA REPAIR    . ILEOSTOMY    . PERIPHERALLY INSERTED CENTRAL CATHETER INSERTION     Family History: History reviewed. No pertinent family history. Family Psychiatric  History:  See HPI Social History:  History  Alcohol Use  . Yes    Comment: occasionally     History  Drug Use No    Comment: THC cocaine    Social History   Social History  . Marital status: Divorced    Spouse name: N/A  . Number of children: N/A  . Years of education: N/A   Occupational History  .      disabled   Social History Main Topics  . Smoking status: Former Smoker    Packs/day: 1.00    Years: 28.00    Types: Cigarettes    Quit date: 08/28/2016  . Smokeless tobacco: Never Used  . Alcohol use Yes     Comment: occasionally  . Drug use: No     Comment: THC cocaine  . Sexual activity: Yes     Comment: Not asked   Other Topics Concern  . None   Social History Narrative   Exercise---  Walking some --  Not enough    Hospital Course:   Lucas Small 51 yo Caucasian male, divorced. Background history of SUD and chronic pain. Drinks on a daily  basis. Presented to the ER in company of his friend.  Intoxicated with alcohol. BAL 246 mg/dl. Stated he took percocet at home though UDS is negative.  Lucas Small was admitted for MDD (major depressive disorder), recurrent severe, without psychosis (HCC) and crisis management.  Patient was treated with medications with their indications listed below in detail under Medication List.  Medical problems were identified and treated as needed.  Home medications were restarted as appropriate.  Improvement was monitored by observation and Lucas Small daily report of symptom reduction.  Emotional and mental status was monitored by daily self inventory reports completed by Lucas Small and clinical staff.  Patient  reported continued improvement, denied any new concerns.  Patient had been compliant on medications and denied side effects.  Support and encouragement was provided.         Lucas Small was evaluated by the treatment team for stability and plans for continued recovery upon discharge.  Patient was offered further treatment options upon discharge including Residential, Intensive Outpatient and Outpatient treatment. Patient will follow up with agency listed below for medication management and counseling.  Encouraged patient to maintain satisfactory support network and home environment.  Advised to adhere to medication compliance and outpatient treatment follow up.  Prescriptions provided.       Lucas Small motivation was an integral factor for scheduling further treatment.  Employment, transportation, bed availability, health status, family support, and any pending legal issues were also considered during patient's hospital stay.  Upon completion of this admission the patient was both mentally and medically stable for discharge denying suicidal/homicidal ideation, auditory/visual/tactile hallucinations, delusional thoughts and paranoia.      Physical Findings: AIMS: Facial and Oral Movements Muscles of Facial Expression: None, normal Lips and Perioral Area: None, normal Jaw: None, normal Tongue: None, normal,Extremity Movements Upper (arms, wrists, hands, fingers): None, normal Lower (legs, knees, ankles, toes): None, normal, Trunk Movements Neck, shoulders, hips: None, normal, Overall Severity Severity of abnormal movements (highest score from questions above): None, normal Incapacitation due to abnormal movements: None, normal Patient's awareness of abnormal movements (rate only patient's report): No Awareness, Dental Status Current problems with teeth and/or dentures?: No Does patient usually wear dentures?: No  CIWA:  CIWA-Ar Total: 1 COWS:  COWS Total Score:  3  Musculoskeletal: Strength & Muscle Tone: within normal limits Gait & Station: normal Patient leans: N/A  Psychiatric Specialty Exam:  See MD SRA Physical Exam  Nursing note and vitals reviewed.   ROS  Blood pressure 103/71, pulse (!) 102, temperature 99.1 F (37.3 C), temperature source Oral, resp. rate 18, height 5' 8.5" (1.74 m), weight 95 kg (209 lb 8 oz).Body mass index is 31.39 kg/m.   Have you used any form of tobacco in the last 30 days? (Cigarettes, Smokeless Tobacco, Cigars, and/or Pipes): Yes  Has this patient used any form of tobacco in the last 30 days? (Cigarettes, Smokeless Tobacco, Cigars, and/or Pipes) Yes, N/A  Blood Alcohol level:  Lab Results  Component Value Date   ETH 246 (H) 10/02/2016   ETH <5 08/27/2014    Metabolic Disorder Labs:  Lab Results  Component Value Date   HGBA1C 6.9 (H) 02/13/2016   No results found for: PROLACTIN Lab Results  Component Value Date   CHOL 158 02/13/2016   TRIG 143.0 02/13/2016   HDL 35.20 (L) 02/13/2016   CHOLHDL 4 02/13/2016   VLDL 28.6 02/13/2016   LDLCALC 94 02/13/2016   LDLCALC 165 (H) 12/05/2015  See Psychiatric Specialty Exam and Suicide Risk Assessment completed by Attending Physician prior to discharge.  Discharge destination:  Home  Is patient on multiple antipsychotic therapies at discharge:  No   Has Patient had three or more failed trials of antipsychotic monotherapy by history:  No  Recommended Plan for Multiple Antipsychotic Therapies: NA   Allergies as of 10/12/2016      Reactions   Bee Venom Anaphylaxis      Medication List    STOP taking these medications   aspirin EC 81 MG tablet   escitalopram 20 MG tablet Commonly known as:  LEXAPRO   glucose blood test strip Commonly known as:  ONETOUCH VERIO   ibuprofen 200 MG tablet Commonly known as:  ADVIL,MOTRIN   multivitamin with minerals Tabs tablet   NONFORMULARY OR COMPOUNDED ITEM   ONETOUCH DELICA LANCETS FINE Misc    oxyCODONE-acetaminophen 5-325 MG tablet Commonly known as:  ROXICET   pantoprazole 40 MG tablet Commonly known as:  PROTONIX   polyethylene glycol packet Commonly known as:  MIRALAX / GLYCOLAX   rosuvastatin 20 MG tablet Commonly known as:  CRESTOR     TAKE these medications     Indication  DULoxetine HCl 40 MG Cpep Take 40 mg by mouth daily.  Indication:  Major Depressive Disorder   gabapentin 300 MG capsule Commonly known as:  NEURONTIN Take 1 capsule (300 mg total) by mouth 3 (three) times daily.  Indication:  Agitation   metFORMIN 500 MG tablet Commonly known as:  GLUCOPHAGE Take 1 tablet (500 mg total) by mouth 2 (two) times daily with a meal.  Indication:  Type 2 Diabetes   nicotine 21 mg/24hr patch Commonly known as:  NICODERM CQ - dosed in mg/24 hours Place 1 patch (21 mg total) onto the skin daily.  Indication:  Nicotine Addiction   traZODone 150 MG tablet Commonly known as:  DESYREL Take 1 tablet (150 mg total) by mouth at bedtime as needed for sleep. What changed:  medication strength  how much to take  Indication:  Trouble Sleeping      Follow-up Information    [facility] up.   Specialty:  Behavioral Health Why:  In the event that you are unable to schedule a timely appointment with a provider, please go to [facility] for a walk-in appointment. Walk-in hours are Mon-Fri 8 AM-3 PM. Please arrive as early as possible to be sure that you are seen. Contact information: [place] [address] [phone]        Patient wishes to schedule his own mental health follow-up appointments Follow up.   Why:  Patient would like to set up his own follow-up appointments. Social worker provided pt with a list of outpatient providers.          Follow-up recommendations:  Activity:  as tol Diet:  as tol  Comments:  1.  Take all your medications as prescribed.   2.  Report any adverse side effects to outpatient provider. 3.  Patient  instructed to not use alcohol or illegal drugs while on prescription medicines. 4.  In the event of worsening symptoms, instructed patient to call 911, the crisis hotline or go to nearest emergency room for evaluation of symptoms.  Signed: Janett Labella, NP [facility] 10/13/2016, 10:52 AM

## 2016-10-15 DIAGNOSIS — N200 Calculus of kidney: Secondary | ICD-10-CM | POA: Diagnosis not present

## 2016-10-15 DIAGNOSIS — N5201 Erectile dysfunction due to arterial insufficiency: Secondary | ICD-10-CM | POA: Diagnosis not present

## 2016-11-01 DIAGNOSIS — F17200 Nicotine dependence, unspecified, uncomplicated: Secondary | ICD-10-CM | POA: Diagnosis not present

## 2016-11-01 DIAGNOSIS — G4733 Obstructive sleep apnea (adult) (pediatric): Secondary | ICD-10-CM | POA: Diagnosis not present

## 2016-11-02 ENCOUNTER — Other Ambulatory Visit: Payer: Self-pay

## 2016-11-02 ENCOUNTER — Ambulatory Visit (HOSPITAL_COMMUNITY): Payer: PPO | Attending: Cardiovascular Disease

## 2016-11-02 DIAGNOSIS — E785 Hyperlipidemia, unspecified: Secondary | ICD-10-CM | POA: Diagnosis not present

## 2016-11-02 DIAGNOSIS — E119 Type 2 diabetes mellitus without complications: Secondary | ICD-10-CM | POA: Diagnosis not present

## 2016-11-02 DIAGNOSIS — I10 Essential (primary) hypertension: Secondary | ICD-10-CM

## 2016-11-02 DIAGNOSIS — I110 Hypertensive heart disease with heart failure: Secondary | ICD-10-CM | POA: Insufficient documentation

## 2016-11-02 DIAGNOSIS — I509 Heart failure, unspecified: Secondary | ICD-10-CM | POA: Insufficient documentation

## 2016-11-05 DIAGNOSIS — R109 Unspecified abdominal pain: Secondary | ICD-10-CM | POA: Diagnosis not present

## 2016-11-08 ENCOUNTER — Ambulatory Visit (INDEPENDENT_AMBULATORY_CARE_PROVIDER_SITE_OTHER): Payer: PPO | Admitting: Family Medicine

## 2016-11-08 VITALS — BP 106/80 | HR 109 | Temp 97.3°F | Resp 16 | Ht 69.0 in | Wt 214.4 lb

## 2016-11-08 DIAGNOSIS — E1165 Type 2 diabetes mellitus with hyperglycemia: Secondary | ICD-10-CM | POA: Diagnosis not present

## 2016-11-08 DIAGNOSIS — F332 Major depressive disorder, recurrent severe without psychotic features: Secondary | ICD-10-CM

## 2016-11-08 DIAGNOSIS — E785 Hyperlipidemia, unspecified: Secondary | ICD-10-CM

## 2016-11-08 DIAGNOSIS — M5441 Lumbago with sciatica, right side: Secondary | ICD-10-CM | POA: Diagnosis not present

## 2016-11-08 DIAGNOSIS — E1151 Type 2 diabetes mellitus with diabetic peripheral angiopathy without gangrene: Secondary | ICD-10-CM | POA: Diagnosis not present

## 2016-11-08 DIAGNOSIS — G4733 Obstructive sleep apnea (adult) (pediatric): Secondary | ICD-10-CM

## 2016-11-08 DIAGNOSIS — R000 Tachycardia, unspecified: Secondary | ICD-10-CM

## 2016-11-08 DIAGNOSIS — G8929 Other chronic pain: Secondary | ICD-10-CM | POA: Diagnosis not present

## 2016-11-08 DIAGNOSIS — IMO0002 Reserved for concepts with insufficient information to code with codable children: Secondary | ICD-10-CM

## 2016-11-08 MED ORDER — CYCLOBENZAPRINE HCL 10 MG PO TABS: 10.0000 mg | ORAL_TABLET | Freq: Three times a day (TID) | ORAL | 0 refills | Status: DC | PRN

## 2016-11-08 MED ORDER — DULOXETINE HCL 40 MG PO CPEP: 40.0000 mg | ORAL_CAPSULE | Freq: Every day | ORAL | 1 refills | Status: DC

## 2016-11-08 NOTE — Patient Instructions (Signed)
Persistent Depressive Disorder Persistent depressive disorder (PDD) is a mental health condition. PDD causes symptoms of low-level depression for 2 years or longer. It may also be called long-term (chronic) depression or dysthymia. PDD may include episodes of more severe depression that last for about 2 weeks (major depressive disorder or MDD). PDD can affect the way you think, feel, and sleep. This condition may also affect your relationships. You may be more likely to get sick if you have PDD. Symptoms of PDD occur for most of the day and may include:  Feeling tired (fatigue).  Low energy.  Eating too much or too little.  Sleeping too much or too little.  Feeling restless or agitated.  Feeling hopeless.  Feeling worthless or guilty.  Feeling worried or nervous (anxiety).  Trouble concentrating or making decisions.  Low self-esteem.  A negative way of looking at things ([place]).  Not being able to have fun or feel pleasure.  Avoiding interacting with people.  Getting angry or annoyed easily (irritability).  Acting aggressive or angry.  Follow these instructions at home: Activity  Go back to your normal activities as told by your doctor.  Exercise regularly as told by your doctor. General instructions  Take over-the-counter and prescription medicines only as told by your doctor.  Do not drink alcohol. Or, limit how much alcohol you drink to no more than 1 drink a day for nonpregnant women and 2 drinks a day for men. One drink equals 12 oz of beer, 5 oz of wine, or 1 oz of hard liquor. Alcohol can affect any antidepressant medicines you are taking. Talk with your doctor about your alcohol use.  Eat a healthy diet and get plenty of sleep.  Find activities that you enjoy each day.  Consider joining a support group. Your doctor may be able to suggest a support group.  Keep all follow-up visits as told by your doctor. This is important. Where to find more  information: National Alliance on Mental Illness  www.nami.org  U.S. National Institute of Mental Health  www.nimh.nih.gov  National Suicide Prevention Lifeline  1-800-273-TALK (1-800-273-8255). This is free, 24-hour help.  Contact a doctor if:  Your symptoms get worse.  You have new symptoms.  You have trouble sleeping or doing your daily activities. Get help right away if:  You self-harm.  You have serious thoughts about hurting yourself or others.  You see, hear, taste, smell, or feel things that are not there (hallucinate). This information is not intended to replace advice given to you by your health care provider. Make sure you discuss any questions you have with your health care provider. Document Released: 04/25/2015 Document Revised: 01/06/2016 Document Reviewed: 01/06/2016 Elsevier Interactive Patient Education  2017 Elsevier Inc.  

## 2016-11-08 NOTE — Progress Notes (Signed)
Patient ID: Lucas Small, male   DOB: [birth year], 51 y.o.   MRN: [id number]    Subjective:  I acted as a Education administrator for Dr. Carollee Herter.  [name], [place]   Patient ID: Lucas Small, male    DOB: Jul 18, 1965, 51 y.o.   MRN: [id number]  Chief Complaint  Patient presents with  . Back Pain    would like Robaxin  . echo    HPI  Patient is in today for back pain and to go over echo results.  He is having back pain and would like to have a muscle relaxant.  He has echo done and he is having a increased heart rate and not sure if he should be on something for blood pressure. He frequently feels his heart racing. He was d/c from beh [facility] and has f/u with psych in August.   Patient Care Team: Carollee Herter, Alferd Apa, DO as PCP - General (Family Medicine)   Past Medical History:  Diagnosis Date  . Alcoholic ([place])   . CHF (congestive heart failure) ([place])   . Depression   . Diabetes mellitus without complication (HCC)    metformin  . Dyspnea    with exertion .  Marland Kitchen Dysrhythmia    while hospitalized in [place]  . History of kidney stones   . Hypertension   . MRSA (methicillin resistant Staphylococcus aureus)   . Multiple myeloma ([place]) 1990's  . Perforation bowel ([name])   . PNA (pneumonia)   . Renal disorder   . Renal failure   . Renal insufficiency   . Respiratory failure ([place])   . Sickle cell anemia (HCC)   . Sleep apnea    no CPAP machine    Past Surgical History:  Procedure Laterality Date  . ABDOMINAL SURGERY    . CARDIAC SURGERY    . HERNIA REPAIR    . ILEOSTOMY    . PERIPHERALLY INSERTED CENTRAL CATHETER INSERTION      No family history on file.  Social History   Social History  . Marital status: Divorced    Spouse name: N/A  . Number of children: N/A  . Years of education: N/A   Occupational History  .      disabled   Social History Main Topics  . Smoking status: Former Smoker    Packs/day: 1.00    Years: 28.00    Types: Cigarettes    Quit date: 08/28/2016   . Smokeless tobacco: Never Used  . Alcohol use Yes     Comment: occasionally  . Drug use: No     Comment: THC cocaine  . Sexual activity: Yes     Comment: Not asked   Other Topics Concern  . Not on file   Social History Narrative   Exercise---  Walking some --  Not enough    Outpatient Medications Prior to Visit  Medication Sig Dispense Refill  . gabapentin (NEURONTIN) 300 MG capsule Take 1 capsule (300 mg total) by mouth 3 (three) times daily. 90 capsule 0  . metFORMIN (GLUCOPHAGE) 500 MG tablet Take 1 tablet (500 mg total) by mouth 2 (two) times daily with a meal. 60 tablet 0  . nicotine (NICODERM CQ - DOSED IN MG/24 HOURS) 21 mg/24hr patch Place 1 patch (21 mg total) onto the skin daily. 28 patch 0  . traZODone (DESYREL) 150 MG tablet Take 1 tablet (150 mg total) by mouth at bedtime as needed for sleep. 30 tablet 0  . DULoxetine 40 MG CPEP  Take 40 mg by mouth daily. 30 capsule 0   Facility-Administered Medications Prior to Visit  Medication Dose Route Frequency Provider Last Rate Last Dose  . folic acid (FOLVITE) tablet 1 mg  1 mg Oral Daily Rankin, Shuvon B, NP      . haloperidol (HALDOL) tablet 5 mg  5 mg Oral Q6H PRN Rankin, Shuvon B, NP      . magnesium hydroxide (MILK OF MAGNESIA) suspension 30 mL  30 mL Oral Daily PRN Rankin, Shuvon B, NP        Allergies  Allergen Reactions  . Bee Venom Anaphylaxis    Review of Systems  Constitutional: Negative for fever and malaise/fatigue.  HENT: Negative for congestion.   Eyes: Negative for blurred vision.  Respiratory: Negative for cough and shortness of breath.   Cardiovascular: Negative for chest pain, palpitations and leg swelling.  Gastrointestinal: Negative for vomiting.  Musculoskeletal: Positive for back pain.  Skin: Negative for rash.  Neurological: Negative for loss of consciousness and headaches.       Objective:    Physical Exam  Constitutional: He is oriented to person, place, and time. Vital signs are  normal. He appears well-developed and well-nourished. He is sleeping.  HENT:  Head: Normocephalic and atraumatic.  Mouth/Throat: Oropharynx is clear and moist.  Eyes: EOM are normal. Pupils are equal, round, and reactive to light.  Neck: Normal range of motion. Neck supple. No thyromegaly present.  Cardiovascular: Normal rate and regular rhythm.   No murmur heard. Pulmonary/Chest: Effort normal and breath sounds normal. No respiratory distress. He has no wheezes. He has no rales. He exhibits no tenderness.  Musculoskeletal: He exhibits no edema or tenderness.  Neurological: He is alert and oriented to person, place, and time.  Skin: Skin is warm and dry.  Psychiatric: His behavior is normal. Judgment normal. Cognition and memory are normal. He exhibits a depressed mood. He expresses no homicidal and no suicidal ideation. He expresses no suicidal plans and no homicidal plans.  Pt has appointment with psych in august  Nursing note and vitals reviewed.   There were no vitals taken for this visit. Wt Readings from Last 3 Encounters:  10/03/16 209 lb 8 oz (95 kg)  10/01/16 211 lb 3.2 oz (95.8 kg)  09/18/16 213 lb 12.8 oz (97 kg)   BP Readings from Last 3 Encounters:  10/12/16 103/71  10/03/16 131/83  10/01/16 116/80     Immunization History  Administered Date(s) Administered  . PPD Test 08/28/2011  . Pneumococcal Polysaccharide-23 02/13/2016    Health Maintenance  Topic Date Due  . TETANUS/TDAP  11/16/1985  . HEMOGLOBIN A1C  08/12/2016  . INFLUENZA VACCINE  12/26/2016  . FOOT EXAM  02/12/2017  . URINE MICROALBUMIN  02/12/2017  . OPHTHALMOLOGY EXAM  02/19/2017  . PNEUMOCOCCAL POLYSACCHARIDE VACCINE (2) 02/12/2021  . HIV Screening  Completed    Lab Results  Component Value Date   WBC 9.9 10/02/2016   HGB 15.6 10/02/2016   HCT 44.3 10/02/2016   PLT 288 10/02/2016   GLUCOSE 148 (H) 10/02/2016   CHOL 158 02/13/2016   TRIG 143.0 02/13/2016   HDL 35.20 (L) 02/13/2016    LDLCALC 94 02/13/2016   ALT 32 10/02/2016   AST 22 10/02/2016   NA 135 10/02/2016   K 3.3 (L) 10/02/2016   CL 101 10/02/2016   CREATININE 1.04 10/02/2016   BUN 19 10/02/2016   CO2 21 (L) 10/02/2016   TSH 1.59 02/13/2016   INR 1.11 10/14/2010  HGBA1C 6.9 (H) 02/13/2016   MICROALBUR 2.5 (H) 02/13/2016    Lab Results  Component Value Date   TSH 1.59 02/13/2016   Lab Results  Component Value Date   WBC 9.9 10/02/2016   HGB 15.6 10/02/2016   HCT 44.3 10/02/2016   MCV 90.2 10/02/2016   PLT 288 10/02/2016   Lab Results  Component Value Date   NA 135 10/02/2016   K 3.3 (L) 10/02/2016   CO2 21 (L) 10/02/2016   GLUCOSE 148 (H) 10/02/2016   BUN 19 10/02/2016   CREATININE 1.04 10/02/2016   BILITOT 0.7 10/02/2016   ALKPHOS 80 10/02/2016   AST 22 10/02/2016   ALT 32 10/02/2016   PROT 7.7 10/02/2016   ALBUMIN 4.3 10/02/2016   CALCIUM 9.1 10/02/2016   ANIONGAP 13 10/02/2016   GFR 81.33 08/21/2016   Lab Results  Component Value Date   CHOL 158 02/13/2016   Lab Results  Component Value Date   HDL 35.20 (L) 02/13/2016   Lab Results  Component Value Date   LDLCALC 94 02/13/2016   Lab Results  Component Value Date   TRIG 143.0 02/13/2016   Lab Results  Component Value Date   CHOLHDL 4 02/13/2016   Lab Results  Component Value Date   HGBA1C 6.9 (H) 02/13/2016         Assessment & Plan:   Problem List Items Addressed This Visit      Unprioritized   Back pain   Relevant Medications   oxyCODONE-acetaminophen (PERCOCET/ROXICET) 5-325 MG tablet   cyclobenzaprine (FLEXERIL) 10 MG tablet   DM (diabetes mellitus) type II uncontrolled, periph vascular disorder (HCC)   Relevant Medications   rosuvastatin (CRESTOR) 20 MG tablet   Other Relevant Orders   Comprehensive metabolic panel   Hemoglobin A1c   Hyperlipidemia LDL goal <70   Relevant Medications   rosuvastatin (CRESTOR) 20 MG tablet   Other Relevant Orders   Lipid panel   Comprehensive metabolic  panel   Major depression, recurrent (HCC)   Relevant Medications   escitalopram (LEXAPRO) 20 MG tablet   DULoxetine HCl 40 MG CPEP    Other Visit Diagnoses    Obstructive sleep apnea    -  Primary   Relevant Orders   Ambulatory referral to Pulmonology   Tachycardia       Relevant Orders   Ambulatory referral to Cardiology      I have discontinued Mr. Mizer DULoxetine. I am also having him start on cyclobenzaprine. Additionally, I am having him maintain his gabapentin, metFORMIN, nicotine, traZODone, oxyCODONE-acetaminophen, pantoprazole, escitalopram, rosuvastatin, and DULoxetine HCl.  Meds ordered this encounter  Medications  . oxyCODONE-acetaminophen (PERCOCET/ROXICET) 5-325 MG tablet  . DISCONTD: traZODone (DESYREL) 50 MG tablet  . pantoprazole (PROTONIX) 40 MG tablet  . DISCONTD: DULoxetine (CYMBALTA) 20 MG capsule  . escitalopram (LEXAPRO) 20 MG tablet  . rosuvastatin (CRESTOR) 20 MG tablet  . cyclobenzaprine (FLEXERIL) 10 MG tablet    Sig: Take 1 tablet (10 mg total) by mouth 3 (three) times daily as needed for muscle spasms.    Dispense:  30 tablet    Refill:  0  . DISCONTD: DULoxetine HCl 40 MG CPEP    Sig: Take 40 mg by mouth daily.    Dispense:  90 capsule    Refill:  1  . DULoxetine HCl 40 MG CPEP    Sig: Take 40 mg by mouth daily.    Dispense:  90 capsule    Refill:  1  CMA served as Education administrator during this visit. History, Physical and Plan performed by medical provider. Documentation and orders reviewed and attested to.  Ann Held, DO

## 2016-11-09 ENCOUNTER — Encounter: Payer: Self-pay | Admitting: Family Medicine

## 2016-11-09 LAB — LIPID PANEL
CHOLESTEROL: 149 mg/dL (ref 0–200)
HDL: 37.8 mg/dL — ABNORMAL LOW (ref >39.00)
LDL CALC: 78 mg/dL (ref 0–99)
NonHDL: 110.9
TRIGLYCERIDES: 166 mg/dL — AB (ref 0.0–149.0)
Total CHOL/HDL Ratio: 4
VLDL: 33.2 mg/dL (ref 0.0–40.0)

## 2016-11-09 LAB — COMPREHENSIVE METABOLIC PANEL
ALBUMIN: 4.4 g/dL (ref 3.5–5.2)
ALT: 24 U/L (ref 0–53)
AST: 16 U/L (ref 0–37)
Alkaline Phosphatase: 81 U/L (ref 39–117)
BUN: 23 mg/dL (ref 6–23)
CALCIUM: 9.6 mg/dL (ref 8.4–10.5)
CHLORIDE: 103 meq/L (ref 96–112)
CO2: 26 mEq/L (ref 19–32)
Creatinine, Ser: 1.16 mg/dL (ref 0.40–1.50)
GFR: 70.84 mL/min (ref >60.00)
GLUCOSE: 260 mg/dL — AB (ref 70–99)
Potassium: 4.2 mEq/L (ref 3.5–5.1)
Sodium: 136 mEq/L (ref 135–145)
TOTAL PROTEIN: 7.4 g/dL (ref 6.0–8.3)
Total Bilirubin: 0.6 mg/dL (ref 0.2–1.2)

## 2016-11-09 LAB — HEMOGLOBIN A1C: Hgb A1c MFr Bld: 7 % — ABNORMAL HIGH (ref 4.6–6.5)

## 2016-11-13 ENCOUNTER — Other Ambulatory Visit: Payer: Self-pay | Admitting: Family Medicine

## 2016-11-13 ENCOUNTER — Ambulatory Visit (INDEPENDENT_AMBULATORY_CARE_PROVIDER_SITE_OTHER): Payer: PPO | Admitting: Adult Health

## 2016-11-13 ENCOUNTER — Encounter: Payer: Self-pay | Admitting: Adult Health

## 2016-11-13 DIAGNOSIS — E119 Type 2 diabetes mellitus without complications: Secondary | ICD-10-CM

## 2016-11-13 DIAGNOSIS — E669 Obesity, unspecified: Secondary | ICD-10-CM

## 2016-11-13 DIAGNOSIS — G4733 Obstructive sleep apnea (adult) (pediatric): Secondary | ICD-10-CM

## 2016-11-13 DIAGNOSIS — E785 Hyperlipidemia, unspecified: Secondary | ICD-10-CM

## 2016-11-13 MED ORDER — FENOFIBRATE 160 MG PO TABS: 160.0000 mg | ORAL_TABLET | Freq: Every day | ORAL | 3 refills | Status: DC

## 2016-11-13 NOTE — Assessment & Plan Note (Signed)
Well controlled and compliance   Plan  Patient Instructions  Keep up the good work. Wear C Pap at bedtime Do not drive a sleepy. Work on [employer] loss Follow Dr. Elsworth Soho in 4-6 months and as needed

## 2016-11-13 NOTE — Assessment & Plan Note (Signed)
Wt loss  

## 2016-11-13 NOTE — Patient Instructions (Signed)
Keep up the good work. Wear C Pap at bedtime Do not drive a sleepy. Work on [employer] loss Follow Dr. Elsworth Soho in 4-6 months and as needed

## 2016-11-13 NOTE — Progress Notes (Signed)
$'@Patient'W$  ID: Lucas Small, male    DOB: [birth year], 51 y.o.   MRN: [id number]  Chief Complaint  Patient presents with  . Follow-up    OSA     Referring provider: Ann Held, *  HPI: 51 yo male smoker followed for OSA   TEST  06/2015 [facility] AHI 34.6 /hr   11/13/2016 Follow up : Severe OSA  Pt returns for a follow-up for sleep apnea.  Patient was found to have severe sleep apnea in 06/2015 . Patient was started on C Pap recently. He says that he feels that is helping. He denies any excessive daytime sleepiness. Feels it is helping him to sleep longer.   We discussed wt loss and healthy sleep regimen .  Download shows good compliance with 77% usage. AHI 1.5. Avg usage at [place] . Min leaks.    Allergies  Allergen Reactions  . Bee Venom Anaphylaxis    Immunization History  Administered Date(s) Administered  . PPD Test 08/28/2011  . Pneumococcal Polysaccharide-23 02/13/2016    Past Medical History:  Diagnosis Date  . Alcoholic ([place])   . CHF (congestive heart failure) ([place])   . Depression   . Diabetes mellitus without complication (HCC)    metformin  . Dyspnea    with exertion .  Marland Kitchen Dysrhythmia    while hospitalized in [place]  . History of kidney stones   . Hypertension   . MRSA (methicillin resistant Staphylococcus aureus)   . Multiple myeloma ([place]) 1990's  . Perforation bowel ([place])   . PNA (pneumonia)   . Renal disorder   . Renal failure   . Renal insufficiency   . Respiratory failure ([place])   . Sickle cell anemia (HCC)   . Sleep apnea    no CPAP machine    Tobacco History: History  Smoking Status  . Former Smoker  . Packs/day: 1.00  . Years: 28.00  . Types: Cigarettes  . Quit date: 08/28/2016  Smokeless Tobacco  . Never Used   Counseling given: Not Answered   Outpatient Encounter Prescriptions as of 11/13/2016  Medication Sig  . cyclobenzaprine (FLEXERIL) 10 MG tablet Take 1 tablet (10 mg total) by mouth 3 (three) times daily as needed  for muscle spasms.  . DULoxetine HCl 40 MG CPEP Take 40 mg by mouth daily.  Marland Kitchen escitalopram (LEXAPRO) 20 MG tablet   . fenofibrate 160 MG tablet Take 1 tablet (160 mg total) by mouth daily.  Marland Kitchen gabapentin (NEURONTIN) 300 MG capsule Take 1 capsule (300 mg total) by mouth 3 (three) times daily.  . metFORMIN (GLUCOPHAGE) 1000 MG tablet Take 1,000 mg by mouth 2 (two) times daily with a meal.  . nicotine (NICODERM CQ - DOSED IN MG/24 HOURS) 21 mg/24hr patch Place 1 patch (21 mg total) onto the skin daily.  Marland Kitchen oxyCODONE-acetaminophen (PERCOCET/ROXICET) 5-325 MG tablet   . pantoprazole (PROTONIX) 40 MG tablet   . rosuvastatin (CRESTOR) 20 MG tablet   . traZODone (DESYREL) 150 MG tablet Take 1 tablet (150 mg total) by mouth at bedtime as needed for sleep.  . [DISCONTINUED] metFORMIN (GLUCOPHAGE) 500 MG tablet Take 1 tablet (500 mg total) by mouth 2 (two) times daily with a meal.   Facility-Administered Encounter Medications as of 11/13/2016  Medication  . folic acid (FOLVITE) tablet 1 mg  . haloperidol (HALDOL) tablet 5 mg  . magnesium hydroxide (MILK OF MAGNESIA) suspension 30 mL     Review of Systems  Constitutional:   No  weight loss, night sweats,  Fevers, chills, fatigue, or  lassitude.  HEENT:   No headaches,  Difficulty swallowing,  Tooth/dental problems, or  Sore throat,                No sneezing, itching, ear ache, nasal congestion, post nasal drip,   CV:  No chest pain,  Orthopnea, PND, swelling in lower extremities, anasarca, dizziness, palpitations, syncope.   GI  No heartburn, indigestion, abdominal pain, nausea, vomiting, diarrhea, change in bowel habits, loss of appetite, bloody stools.   Resp: No shortness of breath with exertion or at rest.  No excess mucus, no productive cough,  No non-productive cough,  No coughing up of blood.  No change in color of mucus.  No wheezing.  No chest wall deformity  Skin: no rash or lesions.  GU: no dysuria, change in color of urine, no  urgency or frequency.  No flank pain, no hematuria   MS:  No joint pain or swelling.  No decreased range of motion.  No back pain.    Physical Exam  BP 122/64 (BP Location: Left Arm, Cuff Size: Normal)   Pulse 76   Ht '5\' 9"'$  (1.753 m)   Wt 212 lb 9.6 oz (96.4 kg)   SpO2 98%   BMI 31.40 kg/m   GEN: A/Ox3; pleasant , NAD, obese .    HEENT:  [place]/AT,  EACs-clear, TMs-wnl, NOSE-clear, THROAT-clear, no lesions, no postnasal drip or exudate noted. Class 2-3 MP airway   NECK:  Supple w/ fair ROM; no JVD; normal carotid impulses w/o bruits; no thyromegaly or nodules palpated; no lymphadenopathy.    RESP  Clear  P & A; w/o, wheezes/ rales/ or rhonchi. no accessory muscle use, no dullness to percussion  CARD:  RRR, no m/r/g, no peripheral edema, pulses intact, no cyanosis or clubbing.  GI:   Soft & nt; nml bowel sounds; no organomegaly or masses detected.   Musco: Warm bil, no deformities or joint swelling noted.   Neuro: alert, no focal deficits noted.    Skin: Warm, no lesions or rashes  Psych:  No change in mood or affect. No depression or anxiety.  No memory loss.  Lab Results:  CBC  BNP No results found for: BNP  ProBNP    Component Value Date/Time   PROBNP 107.1 11/05/2012 0800    Imaging: No results found.   Assessment & Plan:   OSA (obstructive sleep apnea) Well controlled and compliance   Plan  Patient Instructions  Keep up the good work. Wear C Pap at bedtime Do not drive a sleepy. Work on [employer] loss Follow Dr. Elsworth Soho in 4-6 months and as needed    Obesity (BMI 30-39.9) Wt loss      Rexene Edison, NP 11/13/2016

## 2016-11-14 ENCOUNTER — Telehealth: Payer: Self-pay | Admitting: *Deleted

## 2016-11-14 NOTE — Telephone Encounter (Signed)
Received request for Medical records from [place], forwarded to [place] for email/scan/SLS 06/20

## 2016-11-19 NOTE — Progress Notes (Signed)
Reviewed & agree with plan  

## 2016-11-27 ENCOUNTER — Telehealth: Payer: Self-pay | Admitting: *Deleted

## 2016-11-27 NOTE — Telephone Encounter (Signed)
Received request for Medical records from [place], forwarded to [place] for email/scan/SLS 07/03

## 2016-12-01 DIAGNOSIS — F17200 Nicotine dependence, unspecified, uncomplicated: Secondary | ICD-10-CM | POA: Diagnosis not present

## 2016-12-01 DIAGNOSIS — G4733 Obstructive sleep apnea (adult) (pediatric): Secondary | ICD-10-CM | POA: Diagnosis not present

## 2016-12-28 ENCOUNTER — Encounter (HOSPITAL_COMMUNITY): Payer: Self-pay | Admitting: Psychiatry

## 2016-12-28 ENCOUNTER — Encounter (HOSPITAL_COMMUNITY): Payer: Self-pay

## 2016-12-28 ENCOUNTER — Emergency Department (HOSPITAL_COMMUNITY)
Admission: EM | Admit: 2016-12-28 | Discharge: 2016-12-28 | Disposition: A | Discharge status: Home / Self Care | Payer: PPO | Attending: Emergency Medicine | Admitting: Emergency Medicine

## 2016-12-28 ENCOUNTER — Inpatient Hospital Stay (HOSPITAL_COMMUNITY): Admission: RE | Admit: 2016-12-28 | Payer: PPO | Source: Home / Self Care | Admitting: Psychiatry

## 2016-12-28 ENCOUNTER — Ambulatory Visit (INDEPENDENT_AMBULATORY_CARE_PROVIDER_SITE_OTHER): Payer: PPO | Admitting: Psychiatry

## 2016-12-28 VITALS — BP 128/74 | HR 120 | Ht 69.0 in | Wt 211.8 lb

## 2016-12-28 DIAGNOSIS — F17210 Nicotine dependence, cigarettes, uncomplicated: Secondary | ICD-10-CM

## 2016-12-28 DIAGNOSIS — F1020 Alcohol dependence, uncomplicated: Secondary | ICD-10-CM | POA: Diagnosis present

## 2016-12-28 DIAGNOSIS — I509 Heart failure, unspecified: Secondary | ICD-10-CM | POA: Insufficient documentation

## 2016-12-28 DIAGNOSIS — Z87891 Personal history of nicotine dependence: Secondary | ICD-10-CM | POA: Insufficient documentation

## 2016-12-28 DIAGNOSIS — F1014 Alcohol abuse with alcohol-induced mood disorder: Secondary | ICD-10-CM | POA: Diagnosis present

## 2016-12-28 DIAGNOSIS — I110 Hypertensive heart disease with heart failure: Secondary | ICD-10-CM | POA: Diagnosis not present

## 2016-12-28 DIAGNOSIS — F10129 Alcohol abuse with intoxication, unspecified: Secondary | ICD-10-CM

## 2016-12-28 DIAGNOSIS — Z79899 Other long term (current) drug therapy: Secondary | ICD-10-CM | POA: Insufficient documentation

## 2016-12-28 DIAGNOSIS — E1159 Type 2 diabetes mellitus with other circulatory complications: Secondary | ICD-10-CM | POA: Insufficient documentation

## 2016-12-28 DIAGNOSIS — Z7984 Long term (current) use of oral hypoglycemic drugs: Secondary | ICD-10-CM | POA: Insufficient documentation

## 2016-12-28 DIAGNOSIS — F329 Major depressive disorder, single episode, unspecified: Secondary | ICD-10-CM | POA: Diagnosis present

## 2016-12-28 DIAGNOSIS — F333 Major depressive disorder, recurrent, severe with psychotic symptoms: Secondary | ICD-10-CM | POA: Diagnosis present

## 2016-12-28 DIAGNOSIS — R45851 Suicidal ideations: Secondary | ICD-10-CM | POA: Diagnosis not present

## 2016-12-28 DIAGNOSIS — F1024 Alcohol dependence with alcohol-induced mood disorder: Secondary | ICD-10-CM | POA: Diagnosis not present

## 2016-12-28 DIAGNOSIS — F309 Manic episode, unspecified: Secondary | ICD-10-CM | POA: Diagnosis not present

## 2016-12-28 DIAGNOSIS — F332 Major depressive disorder, recurrent severe without psychotic features: Secondary | ICD-10-CM | POA: Diagnosis not present

## 2016-12-28 DIAGNOSIS — Y905 Blood alcohol level of 100-119 mg/100 ml: Secondary | ICD-10-CM | POA: Diagnosis not present

## 2016-12-28 LAB — COMPREHENSIVE METABOLIC PANEL
ALBUMIN: 4.6 g/dL (ref 3.5–5.0)
ALK PHOS: 80 U/L (ref 38–126)
ALT: 34 U/L (ref 17–63)
AST: 36 U/L (ref 15–41)
Anion gap: 14 (ref 5–15)
BUN: 16 mg/dL (ref 6–20)
CALCIUM: 9.3 mg/dL (ref 8.9–10.3)
CHLORIDE: 103 mmol/L (ref 101–111)
CO2: 22 mmol/L (ref 22–32)
CREATININE: 1.4 mg/dL — AB (ref 0.61–1.24)
GFR calc Af Amer: >60 mL/min (ref >60)
GFR calc non Af Amer: 57 mL/min — ABNORMAL LOW (ref >60)
GLUCOSE: 166 mg/dL — AB (ref 65–99)
Potassium: 3.8 mmol/L (ref 3.5–5.1)
SODIUM: 139 mmol/L (ref 135–145)
Total Bilirubin: 0.4 mg/dL (ref 0.3–1.2)
Total Protein: 8.4 g/dL — ABNORMAL HIGH (ref 6.5–8.1)

## 2016-12-28 LAB — RAPID URINE DRUG SCREEN, HOSP PERFORMED
AMPHETAMINES: NOT DETECTED
BARBITURATES: NOT DETECTED
Benzodiazepines: NOT DETECTED
COCAINE: NOT DETECTED
OPIATES: NOT DETECTED
TETRAHYDROCANNABINOL: NOT DETECTED

## 2016-12-28 LAB — CBC
HEMATOCRIT: 48.8 % (ref 39.0–52.0)
HEMOGLOBIN: 17.6 g/dL — AB (ref 13.0–17.0)
MCH: 32.5 pg (ref 26.0–34.0)
MCHC: 36.1 g/dL — AB (ref 30.0–36.0)
MCV: 90 fL (ref 78.0–100.0)
Platelets: 278 10*3/uL (ref 150–400)
RBC: 5.42 MIL/uL (ref 4.22–5.81)
RDW: 13.5 % (ref 11.5–15.5)
WBC: 8.6 10*3/uL (ref 4.0–10.5)

## 2016-12-28 LAB — SALICYLATE LEVEL

## 2016-12-28 LAB — ACETAMINOPHEN LEVEL

## 2016-12-28 LAB — ETHANOL: Alcohol, Ethyl (B): 109 mg/dL — ABNORMAL HIGH (ref <5)

## 2016-12-28 MED ORDER — ACETAMINOPHEN 325 MG PO TABS: 650.0000 mg | ORAL_TABLET | ORAL | Status: DC | PRN

## 2016-12-28 MED ORDER — RISPERIDONE 1 MG PO TBDP
2.0000 mg | ORAL_TABLET | Freq: Three times a day (TID) | ORAL | Status: DC | PRN
  Filled 2016-12-28: qty 2

## 2016-12-28 MED ORDER — ROSUVASTATIN CALCIUM 20 MG PO TABS
20.0000 mg | ORAL_TABLET | Freq: Every day | ORAL | Status: DC
  Filled 2016-12-28: qty 1

## 2016-12-28 MED ORDER — LORAZEPAM 2 MG/ML IJ SOLN: 0.0000 mg | Freq: Four times a day (QID) | INTRAMUSCULAR | Status: DC

## 2016-12-28 MED ORDER — VITAMIN B-1 100 MG PO TABS: 100.0000 mg | ORAL_TABLET | Freq: Every day | ORAL | Status: DC

## 2016-12-28 MED ORDER — LORAZEPAM 1 MG PO TABS: 1.0000 mg | ORAL_TABLET | ORAL | Status: DC | PRN

## 2016-12-28 MED ORDER — THIAMINE HCL 100 MG/ML IJ SOLN: 100.0000 mg | Freq: Every day | INTRAMUSCULAR | Status: DC

## 2016-12-28 MED ORDER — LORAZEPAM 1 MG PO TABS: 0.0000 mg | ORAL_TABLET | Freq: Four times a day (QID) | ORAL | Status: DC

## 2016-12-28 MED ORDER — LORAZEPAM 2 MG/ML IJ SOLN: 0.0000 mg | Freq: Two times a day (BID) | INTRAMUSCULAR | Status: DC

## 2016-12-28 MED ORDER — ZIPRASIDONE MESYLATE 20 MG IM SOLR: 20.0000 mg | INTRAMUSCULAR | Status: DC | PRN

## 2016-12-28 MED ORDER — LORAZEPAM 1 MG PO TABS: 0.0000 mg | ORAL_TABLET | Freq: Two times a day (BID) | ORAL | Status: DC

## 2016-12-28 MED ORDER — FENOFIBRATE 160 MG PO TABS
160.0000 mg | ORAL_TABLET | Freq: Every day | ORAL | Status: DC
  Filled 2016-12-28: qty 1

## 2016-12-28 MED ORDER — PANTOPRAZOLE SODIUM 40 MG PO TBEC: 40.0000 mg | DELAYED_RELEASE_TABLET | Freq: Every day | ORAL | Status: DC

## 2016-12-28 MED ORDER — GABAPENTIN 300 MG PO CAPS: 300.0000 mg | ORAL_CAPSULE | Freq: Three times a day (TID) | ORAL | Status: DC

## 2016-12-28 MED ORDER — METFORMIN HCL 500 MG PO TABS: 1000.0000 mg | ORAL_TABLET | Freq: Two times a day (BID) | ORAL | Status: DC

## 2016-12-28 MED ORDER — ZOLPIDEM TARTRATE 5 MG PO TABS: 5.0000 mg | ORAL_TABLET | Freq: Every evening | ORAL | Status: DC | PRN

## 2016-12-28 MED ORDER — DULOXETINE HCL 20 MG PO CPEP
40.0000 mg | ORAL_CAPSULE | Freq: Every day | ORAL | Status: DC
  Filled 2016-12-28: qty 2

## 2016-12-28 MED ORDER — ONDANSETRON HCL 4 MG PO TABS: 4.0000 mg | ORAL_TABLET | Freq: Three times a day (TID) | ORAL | Status: DC | PRN

## 2016-12-28 MED ORDER — TRAZODONE HCL 50 MG PO TABS: 150.0000 mg | ORAL_TABLET | Freq: Every evening | ORAL | Status: DC | PRN

## 2016-12-28 NOTE — Discharge Instructions (Signed)
For your ongoing mental health needs, you are advised to follow up with [facility].  New and returning patients are seen at their walk-in clinic.  Walk-in hours are Monday - Friday from 8:00 am - 3:00 pm.  Walk-in patients are seen on a first come, first served basis.  Try to arrive as early as possible for he best chance of being seen the same day: ° °     [facility] °     [address] °     [place], [place] [address] °     [phone] °
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
## 2016-12-28 NOTE — ED Notes (Signed)
Belongings placed in locker #29.

## 2016-12-28 NOTE — ED Notes (Signed)
Patient changed into burgundy scrubs. Patient and belongings bag x 1  And a back pack wanded by security.

## 2016-12-28 NOTE — Progress Notes (Signed)
Psychiatric Initial Adult Assessment   Patient Identification: Lucas Small MRN:  [id number] Date of Evaluation:  12/28/2016 Referral Source: .  Self-referred  Chief Complaint:   You need to give me something to calm me down.  I did not sleep last night  Visit Diagnosis: No diagnosis found.  History of Present Illness:  Patient is 51 year old Caucasian man who is self-referred to help manage his mental illness.  Patient appears drunk and very labile.  He appears intoxicated.  He requested Xanax to calm down.  He admitted that he had 8-10 drinks last night and he could not sleep.  He mentioned lately his friend is moved in with him and he workup by the situation.  Patient appears very labile, confused and difficult to comprehend.  He admitted history of memory problem and does not remember what medicine he is taking.  [facility]  Most of the information was obtained from electronic medical record.  Patient has history of alcohol dependence.  Upon his last admission his blood alcohol level was 246.  He was discharged on Cymbalta and recommended to follow-up at [facility].  However patient do not remember his follow-up appointments.  He did not answer about suicidal thoughts, hallucination, paranoia but appears very intoxicated, labile and incoherent.  I offered him voluntary hospitalization and medical clearance.  He agreed and he asked me to call his sister works at the hospital.  I called his S sister Mardene Celeste who was driving and asked me to call his friend Ron to help them.  I called his friend Ron but he was unable to come to bring him to the emergency room.  Finally our nurse Consepcion Hearing took him to the emergency room.  Patient need evaluation, medical clearance and treatment for alcohol intoxication.  Complete an evaluation cannot be done due to patient intoxicated and incoherent.  Past Psychiatric History: Reviewed.  Previous Psychotropic  Medications: Yes   Substance Abuse History in the last 12 months:  Yes.    Consequences of Substance Abuse: Patient currently intoxicated and unable to provide relevant information.  Past Medical History:  Past Medical History:  Diagnosis Date  . Alcoholic (HCC)   . CHF (congestive heart failure) (HCC)   . Depression   . Diabetes mellitus without complication (HCC)    metformin  . Dyspnea    with exertion .  Marland Kitchen Dysrhythmia    while hospitalized in [place]  . History of kidney stones   . Hypertension   . MRSA (methicillin resistant Staphylococcus aureus)   . Multiple myeloma (HCC) 1990's  . Perforation bowel (HCC)   . PNA (pneumonia)   . Renal disorder   . Renal failure   . Renal insufficiency   . Respiratory failure (HCC)   . Sickle cell anemia (HCC)   . Sleep apnea    no CPAP machine    Past Surgical History:  Procedure Laterality Date  . ABDOMINAL SURGERY    . CARDIAC SURGERY    . HERNIA REPAIR    . ILEOSTOMY    . PERIPHERALLY INSERTED CENTRAL CATHETER INSERTION      Family Psychiatric History: Reviewed.  Family History: No family history on file.  Social History:   Social History   Social History  . Marital status: Divorced    Spouse name: N/A  . Number of children: N/A  . Years of education: N/A   Occupational History  .      disabled  Social History Main Topics  . Smoking status: Former Smoker    Packs/day: 1.00    Years: 28.00    Types: Cigarettes    Quit date: 08/28/2016  . Smokeless tobacco: Never Used  . Alcohol use Yes     Comment: occasionally  . Drug use: No     Comment: THC cocaine  . Sexual activity: Yes     Comment: Not asked   Other Topics Concern  . None   Social History Narrative   Exercise---  Walking some --  Not enough      Allergies:   Allergies  Allergen Reactions  . Bee Venom Anaphylaxis    Metabolic Disorder Labs: Lab Results  Component Value Date   HGBA1C 7.0 (H) 11/08/2016   No results found for:  PROLACTIN Lab Results  Component Value Date   CHOL 149 11/08/2016   TRIG 166.0 (H) 11/08/2016   HDL 37.80 (L) 11/08/2016   CHOLHDL 4 11/08/2016   VLDL 33.2 11/08/2016   LDLCALC 78 11/08/2016   LDLCALC 94 02/13/2016     Current Medications: Current Outpatient Prescriptions  Medication Sig Dispense Refill  . cyclobenzaprine (FLEXERIL) 10 MG tablet Take 1 tablet (10 mg total) by mouth 3 (three) times daily as needed for muscle spasms. 30 tablet 0  . DULoxetine HCl 40 MG CPEP Take 40 mg by mouth daily. 90 capsule 1  . escitalopram (LEXAPRO) 20 MG tablet     . fenofibrate 160 MG tablet Take 1 tablet (160 mg total) by mouth daily. 30 tablet 3  . gabapentin (NEURONTIN) 300 MG capsule Take 1 capsule (300 mg total) by mouth 3 (three) times daily. 90 capsule 0  . metFORMIN (GLUCOPHAGE) 1000 MG tablet Take 1,000 mg by mouth 2 (two) times daily with a meal.    . nicotine (NICODERM CQ - DOSED IN MG/24 HOURS) 21 mg/24hr patch Place 1 patch (21 mg total) onto the skin daily. 28 patch 0  . oxyCODONE-acetaminophen (PERCOCET/ROXICET) 5-325 MG tablet     . pantoprazole (PROTONIX) 40 MG tablet     . rosuvastatin (CRESTOR) 20 MG tablet     . traZODone (DESYREL) 150 MG tablet Take 1 tablet (150 mg total) by mouth at bedtime as needed for sleep. 30 tablet 0   No current facility-administered medications for this visit.    Facility-Administered Medications Ordered in Other Visits  Medication Dose Route Frequency Provider Last Rate Last Dose  . folic acid (FOLVITE) tablet 1 mg  1 mg Oral Daily Rankin, Shuvon B, NP      . haloperidol (HALDOL) tablet 5 mg  5 mg Oral Q6H PRN Rankin, Shuvon B, NP      . magnesium hydroxide (MILK OF MAGNESIA) suspension 30 mL  30 mL Oral Daily PRN Rankin, Shuvon B, NP        Psychiatric Specialty Exam: ROS  Blood pressure 128/74, pulse (!) 120, height '5\' 9"'$  (1.753 m), weight 211 lb 12.8 oz (96.1 kg).Body mass index is 31.28 kg/m.  General Appearance: Disheveled and  Alcohol on his breath.  Intoxicated.  Eye Contact:  Minimal  Speech:  Incoherent  Volume:  Normal  Mood:  Irritable  Affect:  Inappropriate and Labile  Thought Process:  Descriptions of Associations: Loose  Orientation:  Full (Time, Place, and Person)  Thought Content:  Tangential  Suicidal Thoughts:  Did not answer  Homicidal Thoughts:  Did not answer  Memory:  Immediate;   Fair Recent;   Poor Remote;   Poor  Judgement:  Impaired  Insight:  Lacking  Psychomotor Activity:  Restlessness and Shuffling Gait  Concentration:  Concentration: Poor and Attention Span: Poor  Recall:  Poor  Fund of Knowledge:Fair  Language: Fair  Akathisia:  No  Handed:  Right  AIMS (if indicated):  0  Assets:  Desire for Improvement  ADL's:  Intact  Cognition: Impaired,  Moderate  Sleep:  Poor    Assessment: Alcohol intoxication.  Plan: Patient appears incoherent, labile, irritable and had alcohol on his breath.  Patient was escorted to the emergency room for medical clearance and treatment for alcohol dependence.  Adrienne Delay T., MD 8/3/20188:21 AM
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
## 2016-12-28 NOTE — ED Provider Notes (Signed)
[place] DEPT Provider Note   CSN: [id number] Arrival date & time: 12/28/16  2263     History   Chief Complaint Chief Complaint  Patient presents with  . Medical Clearance  . Suicidal    HPI Lucas Small is a 51 y.o. male.  HPI  Pt with hx of alcohol abuse, DM, depression comes in with cc of SI. Pt reports that 2 days ago his friend moved in with him, and that has worked him up. Pt has not slept in 2 days. He is having suicidal thoughts and this AM he was hearing voices. Pt admits to heavy drinking last night. Pt denies nausea, emesis, fevers, chills, chest pains, shortness of breath, headaches, abdominal pain, uti like symptoms.    Past Medical History:  Diagnosis Date  . Alcoholic ([place])   . CHF (congestive heart failure) ([place])   . Depression   . Diabetes mellitus without complication (HCC)    metformin  . Dyspnea    with exertion .  Marland Kitchen Dysrhythmia    while hospitalized in [place]  . History of kidney stones   . Hypertension   . MRSA (methicillin resistant Staphylococcus aureus)   . Multiple myeloma ([place]) 1990's  . Perforation bowel ([place])   . PNA (pneumonia)   . Renal disorder   . Renal failure   . Renal insufficiency   . Respiratory failure ([place])   . Sickle cell anemia (HCC)   . Sleep apnea    no CPAP machine    Patient Active Problem List   Diagnosis Date Noted  . Major depressive disorder, recurrent episode, severe, with psychosis ([place]) 12/28/2016  . Obesity (BMI 30-39.9) 11/13/2016  . MDD (major depressive disorder), recurrent severe, without psychosis ([place]) 10/05/2016  . OSA (obstructive sleep apnea) 04/25/2016  . Back pain 12/15/2015  . GERD (gastroesophageal reflux disease) 12/15/2015  . DM (diabetes mellitus) type II uncontrolled, periph vascular disorder ([place]) 12/15/2015  . Hyperlipidemia LDL goal <70 12/15/2015  . Diverticulitis 03/05/2013  . Tobacco dependency 03/05/2013  . DOE (dyspnea on exertion) 03/05/2013  . Major depression,  recurrent ([place]) 11/24/2012  . Snoring disorder 08/27/2011  . Substance induced mood disorder ([place]) 08/23/2011    Class: Acute  . Alcohol use disorder, severe, dependence ([place]) 08/23/2011    Class: Acute    Past Surgical History:  Procedure Laterality Date  . ABDOMINAL SURGERY    . CARDIAC SURGERY    . HERNIA REPAIR    . ILEOSTOMY    . PERIPHERALLY INSERTED CENTRAL CATHETER INSERTION         Home Medications    Prior to Admission medications   Medication Sig Start Date End Date Taking? Authorizing Provider  cyclobenzaprine (FLEXERIL) 10 MG tablet Take 1 tablet (10 mg total) by mouth 3 (three) times daily as needed for muscle spasms. 11/08/16  Yes Roma Schanz R, DO  DULoxetine HCl 40 MG CPEP Take 40 mg by mouth daily. 11/08/16  Yes Roma Schanz R, DO  escitalopram (LEXAPRO) 20 MG tablet Take 20 mg by mouth daily.  10/01/16  Yes [provider]  fenofibrate 160 MG tablet Take 1 tablet (160 mg total) by mouth daily. 11/13/16  Yes Roma Schanz R, DO  gabapentin (NEURONTIN) 300 MG capsule Take 1 capsule (300 mg total) by mouth 3 (three) times daily. 10/12/16  Yes Kerrie Buffalo, NP  metFORMIN (GLUCOPHAGE) 1000 MG tablet Take 1,000 mg by mouth 2 (two) times daily with a meal.   Yes [provider]  nicotine (NICODERM CQ - DOSED IN MG/24 HOURS) 21 mg/24hr patch Place 1 patch (21 mg total) onto the skin daily. 10/13/16  Yes Kerrie Buffalo, NP  pantoprazole (PROTONIX) 40 MG tablet Take 40 mg by mouth daily.  09/17/16  Yes [provider]  rosuvastatin (CRESTOR) 20 MG tablet Take 20 mg by mouth daily.  09/14/16  Yes [provider]  traZODone (DESYREL) 150 MG tablet Take 1 tablet (150 mg total) by mouth at bedtime as needed for sleep. 10/12/16  Yes Kerrie Buffalo, NP    Family History History reviewed. No pertinent family history.  Social History Social History  Substance Use Topics  . Smoking status: Former Smoker    Packs/day: 1.00      Years: 28.00    Types: Cigarettes    Quit date: 08/28/2016  . Smokeless tobacco: Never Used  . Alcohol use Yes     Comment: occasionally     Allergies   Bee venom   Review of Systems Review of Systems  Psychiatric/Behavioral: Positive for confusion and suicidal ideas. The patient is nervous/anxious.   All other systems reviewed and are negative.    Physical Exam Updated Vital Signs BP (!) 127/93 (BP Location: Left Arm)   Pulse (!) 130   Temp 98.4 F (36.9 C) (Oral)   Resp 18   Ht '5\' 9"'$  (1.753 m)   Wt 95.3 kg (210 lb)   SpO2 93%   BMI 31.01 kg/m   Physical Exam  Constitutional: He is oriented to person, place, and time. He appears well-developed.  HENT:  Head: Atraumatic.  Neck: Neck supple.  Cardiovascular: Normal rate.   Pulmonary/Chest: Effort normal.  Neurological: He is alert and oriented to person, place, and time.  Skin: Skin is warm.  Psychiatric: He has a normal mood and affect. His behavior is normal. Judgment and thought content normal.  Nursing note and vitals reviewed.    ED Treatments / Results  Labs (all labs ordered are listed, but only abnormal results are displayed) Labs Reviewed  COMPREHENSIVE METABOLIC PANEL - Abnormal; Notable for the following:       Result Value   Glucose, Bld 166 (*)    Creatinine, Ser 1.40 (*)    Total Protein 8.4 (*)    GFR calc non Af Amer 57 (*)    All other components within normal limits  ETHANOL - Abnormal; Notable for the following:    Alcohol, Ethyl (B) 109 (*)    All other components within normal limits  ACETAMINOPHEN LEVEL - Abnormal; Notable for the following:    Acetaminophen (Tylenol), Serum <10 (*)    All other components within normal limits  CBC - Abnormal; Notable for the following:    Hemoglobin 17.6 (*)    MCHC 36.1 (*)    All other components within normal limits  SALICYLATE LEVEL  RAPID URINE DRUG SCREEN, HOSP PERFORMED    EKG  EKG Interpretation None       Radiology No  results found.  Procedures Procedures (including critical care time)  Medications Ordered in ED Medications  fenofibrate tablet 160 mg (not administered)  metFORMIN (GLUCOPHAGE) tablet 1,000 mg (not administered)  DULoxetine HCl CPEP 40 mg (not administered)  gabapentin (NEURONTIN) capsule 300 mg (not administered)  rosuvastatin (CRESTOR) tablet 20 mg (not administered)  pantoprazole (PROTONIX) EC tablet 40 mg (not administered)  traZODone (DESYREL) tablet 150 mg (not administered)  risperiDONE (RISPERDAL M-TABS) disintegrating tablet 2 mg (not administered)    And  LORazepam (ATIVAN) tablet 1 mg (not administered)    And  ziprasidone (GEODON) injection 20 mg (not administered)  zolpidem (AMBIEN) tablet 5 mg (not administered)  acetaminophen (TYLENOL) tablet 650 mg (not administered)  ondansetron (ZOFRAN) tablet 4 mg (not administered)     Initial Impression / Assessment and Plan / ED Course  I have reviewed the triage vital signs and the nursing notes.  Pertinent labs & imaging results that were available during my care of the patient were reviewed by me and considered in my medical decision making (see chart for details).     Pt comes in with SI, mania. Seems like his friend moved in, and that caused equilibrium shift. Pt is feeling better in the ER. He would like to go home, however, he was sent here by his psychiatrist. Pt went to [facility] and was brought to the ER. He has no medical complains. Pt took 2 trazodone and drank heavily last night. He is medically cleared.  Final Clinical Impressions(s) / ED Diagnoses   Final diagnoses:  Major depressive disorder, recurrent episode, severe, with psychosis ([place])  Mania ([place])    New Prescriptions New Prescriptions   No medications on file     Varney Biles, MD 12/28/16 1103

## 2016-12-28 NOTE — BH Assessment (Signed)
[place] Assessment Progress Note Case was staffed with Marion Downer who contacted Dwyane Dee MD to staff case. Dwyane Dee MD and Reita Cliche DNP agreed that patient could be discharged this date and will recommend patient follow up with a OP provider. Patient will be provided with OP resources and encouraged to follow up with [facility].
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
## 2016-12-28 NOTE — BH Assessment (Signed)
[name]  Per Hampton Abbot, MD, this pt does not require psychiatric hospitalization at this time.  Pt is to be discharged from [facility] with recommendation to follow up with [name].  This has been included in pt's discharge instructions.  Pt's nurse, Caren Griffins, has been notified.  Jalene Mullet, [place] Triage Specialist [phone]

## 2017-01-01 DIAGNOSIS — F17200 Nicotine dependence, unspecified, uncomplicated: Secondary | ICD-10-CM | POA: Diagnosis not present

## 2017-01-01 DIAGNOSIS — G4733 Obstructive sleep apnea (adult) (pediatric): Secondary | ICD-10-CM | POA: Diagnosis not present

## 2017-01-11 ENCOUNTER — Ambulatory Visit (INDEPENDENT_AMBULATORY_CARE_PROVIDER_SITE_OTHER): Payer: PPO | Admitting: Cardiovascular Disease

## 2017-01-11 ENCOUNTER — Encounter: Payer: Self-pay | Admitting: Cardiovascular Disease

## 2017-01-11 VITALS — BP 110/90 | HR 91 | Ht 69.0 in | Wt 212.0 lb

## 2017-01-11 DIAGNOSIS — I519 Heart disease, unspecified: Secondary | ICD-10-CM | POA: Diagnosis not present

## 2017-01-11 DIAGNOSIS — IMO0002 Reserved for concepts with insufficient information to code with codable children: Secondary | ICD-10-CM

## 2017-01-11 DIAGNOSIS — E669 Obesity, unspecified: Secondary | ICD-10-CM | POA: Diagnosis not present

## 2017-01-11 DIAGNOSIS — E1165 Type 2 diabetes mellitus with hyperglycemia: Secondary | ICD-10-CM

## 2017-01-11 DIAGNOSIS — F17200 Nicotine dependence, unspecified, uncomplicated: Secondary | ICD-10-CM

## 2017-01-11 DIAGNOSIS — E785 Hyperlipidemia, unspecified: Secondary | ICD-10-CM | POA: Diagnosis not present

## 2017-01-11 DIAGNOSIS — G4733 Obstructive sleep apnea (adult) (pediatric): Secondary | ICD-10-CM

## 2017-01-11 DIAGNOSIS — E1151 Type 2 diabetes mellitus with diabetic peripheral angiopathy without gangrene: Secondary | ICD-10-CM | POA: Diagnosis not present

## 2017-01-11 DIAGNOSIS — I5189 Other ill-defined heart diseases: Secondary | ICD-10-CM

## 2017-01-11 NOTE — Patient Instructions (Signed)
Dr Sallyanne Kuster recommends that you follow-up with him as needed.

## 2017-01-11 NOTE — Progress Notes (Signed)
Cardiology Consultation Note:    Date:  01/12/2017   ID:  Lucas Small, DOB 14-Aug-1965, MRN [id number]  PCP:  Lucas Small, Lucas Apa, DO  Cardiologist:  Lucas Klein, MD    Referring MD: Lucas Small, Lucas Small, *   Chief Complaint  Patient presents with  . Follow-up    NP. HR high lately.  Marland Kitchen Headache    Getting up.  . Fatigue  Lucas Small is a 51 y.o. male who is being seen today for the evaluation of Echo abnormalities at the request of Lucas Small, Lucas Small, *.   History of Present Illness:    Lucas Small is a 51 y.o. male with mild abnormalities identified on a recent echo. It showed mild LVH (septum 13 mm, PW 11 mm) and all the Doppler parameters are consistent with grade I diastolic dysfunction, without elevated mean left atrial pressure.  He has chronic problems with back pain and has chest discomfort that is clearly positional. He does not engage in any intense physical activity. He has dyspnea that is also positional, not exertional. Denies orthopnea or edema. No palpitations or syncope or leg edema.   He takes both statin and fenofibrate for mixed hyperlipidemia and he has "a little diabetes". He is a smoker and has tried to quit a handful of times. He has been diagnosed with OSA but does not use CPAP. Recently he has not paid attention to his diet and has gained weight (usually 190 lb, now 212 lb).  He reports a hx of multisystem organ failure in 2012, following septic shock related to sigmoid diverticulitis. He reports that he nearly died. He had prolonged intubation, tracheostomy, transient heart failure and renal failure. He states that he had 16 surgeries, including removal of a large part of his abdominal muscles. These events have had a major impact on every aspect of his life since then.  He also has depression and a history of alcohol abuse, requiring previous hospitalization for withdrawal management and suicidal ideation.  He was born in [place],  but was educated in [place] and has a mild Sottish brogue.  Past Medical History:  Diagnosis Date  . Alcoholic ([place])   . CHF (congestive heart failure) ([place])   . Depression   . Diabetes mellitus without complication (HCC)    metformin  . Dyspnea    with exertion .  Marland Kitchen Dysrhythmia    while hospitalized in [place]  . History of kidney stones   . Hypertension   . MRSA (methicillin resistant Staphylococcus aureus)   . Multiple myeloma ([place]) 1990's  . 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 PNA (pneumonia)   . Renal disorder   . Renal failure   . Renal insufficiency   . Respiratory failure ([place])   . Sickle cell anemia (HCC)   . Sleep apnea    no CPAP machine    Past Surgical History:  Procedure Laterality Date  . ABDOMINAL SURGERY    . CARDIAC SURGERY    . HERNIA REPAIR    . ILEOSTOMY    . PERIPHERALLY INSERTED CENTRAL CATHETER INSERTION      Current Medications: Current Meds  Medication Sig  . cyclobenzaprine (FLEXERIL) 10 MG tablet Take 1 tablet (10 mg total) by mouth 3 (three) times daily as needed for muscle spasms.  . DULoxetine HCl 40 MG CPEP Take 40 mg by mouth daily.  Marland Kitchen escitalopram (LEXAPRO) 20 MG tablet Take 20 mg by mouth daily.   . fenofibrate 160  MG tablet Take 1 tablet (160 mg total) by mouth daily.  Marland Kitchen gabapentin (NEURONTIN) 300 MG capsule Take 1 capsule (300 mg total) by mouth 3 (three) times daily.  . metFORMIN (GLUCOPHAGE) 1000 MG tablet Take 1,000 mg by mouth 2 (two) times daily with a meal.  . nicotine (NICODERM CQ - DOSED IN MG/24 HOURS) 21 mg/24hr patch Place 1 patch (21 mg total) onto the skin daily.  . pantoprazole (PROTONIX) 40 MG tablet Take 40 mg by mouth daily.   . rosuvastatin (CRESTOR) 20 MG tablet Take 20 mg by mouth daily.   . traZODone (DESYREL) 150 MG tablet Take 1 tablet (150 mg total) by mouth at bedtime as needed for sleep.     Allergies:   Bee venom   Social History   Social History  . Marital status: Divorced    Spouse name: N/A  .  Number of children: N/A  . Years of education: N/A   Occupational History  .      disabled   Social History Main Topics  . Smoking status: Former Smoker    Packs/day: 1.00    Years: 28.00    Types: Cigarettes    Quit date: 08/28/2016  . Smokeless tobacco: Never Used  . Alcohol use Yes     Comment: occasionally  . Drug use: No     Comment: THC cocaine  . Sexual activity: Yes     Comment: Not asked   Other Topics Concern  . None   Social History Narrative   Exercise---  Walking some --  Not enough     Family History: The patient's family history includes Heart disease in his mother; Heart failure in his mother. ROS:   Please see the history of present illness.     All other systems reviewed and are negative.  EKGs/Labs/Other Studies Reviewed:    The following studies were reviewed today: Echo 11/02/2016, CT chest 2014  EKG:  EKG is ordered today.  The ekg ordered today and the one from 08/21/2016 demonstrate NSR, normal tracing  Recent Labs: 02/13/2016: TSH 1.59 03/10/2016: Magnesium 1.8 12/28/2016: ALT 34; BUN 16; Creatinine, Ser 1.40; Hemoglobin 17.6; Platelets 278; Potassium 3.8; Sodium 139  Recent Lipid Panel    Component Value Date/Time   CHOL 149 11/08/2016 1411   TRIG 166.0 (H) 11/08/2016 1411   HDL 37.80 (L) 11/08/2016 1411   CHOLHDL 4 11/08/2016 1411   VLDL 33.2 11/08/2016 1411   LDLCALC 78 11/08/2016 1411    Physical Exam:    VS:  BP 110/90   Pulse 91   Ht '5\' 9"'$  (1.753 m)   Wt 212 lb (96.2 kg)   BMI 31.31 kg/m     Wt Readings from Last 3 Encounters:  01/11/17 212 lb (96.2 kg)  12/28/16 210 lb (95.3 kg)  12/28/16 211 lb 12.8 oz (96.1 kg)     GEN:  Well nourished, well developed in no acute distress HEENT: Normal NECK: No JVD; No carotid bruits LYMPHATICS: No lymphadenopathy CARDIAC: RRR, no murmurs, rubs, gallops RESPIRATORY:  Clear to auscultation without rales, wheezing or rhonchi  ABDOMEN: Soft, non-tender, non-distended MUSCULOSKELETAL:   No edema; No deformity  SKIN: Warm and dry NEUROLOGIC:  Alert and oriented x 3 PSYCHIATRIC:  Normal affect   ASSESSMENT:    1. Hyperlipidemia LDL goal <70   2. Echocardiogram shows left ventricular diastolic dysfunction   3. DM (diabetes mellitus) type II uncontrolled, periph vascular disorder (HCC)   4. Obesity (BMI 30-39.9)  5. OSA (obstructive sleep apnea)   6. Tobacco dependency    PLAN:    In order of problems listed above:  1. Diast dysf: without overt heart failure. Probably related to previous HTN, now BP normal without meds. Note presence of mild coronary calcifications on chest CT in 2014 (my review). No other reason to suspect widespread CAD as cause of diastolic dysfunction. No specific therapy is needed, but focus on weight loss, healthy diet and try to find some way to exercise regularly. 2. HLP: both TG and LDL are close to target range on current therapy. 3. DM: satisfactory control, A1c 7%. 4. Obesity: weight loss recommended. 5. OSA: he should reconsider using CPAP. 6. Smoking: cessation discussed. He managed to quit for 4 months in the past. Probably not a good Chantix candidate.   Medication Adjustments/Labs and Tests Ordered: Current medicines are reviewed at length with the patient today.  Concerns regarding medicines are outlined above.  No orders of the defined types were placed in this encounter.  No orders of the defined types were placed in this encounter.   Signed, Lucas Klein, MD  01/12/2017 2:17 PM    [facility]

## 2017-01-12 DIAGNOSIS — I5189 Other ill-defined heart diseases: Secondary | ICD-10-CM | POA: Insufficient documentation

## 2017-01-12 DIAGNOSIS — I519 Heart disease, unspecified: Secondary | ICD-10-CM | POA: Insufficient documentation

## 2017-01-21 ENCOUNTER — Ambulatory Visit (INDEPENDENT_AMBULATORY_CARE_PROVIDER_SITE_OTHER): Payer: PPO | Admitting: Psychiatry

## 2017-01-21 ENCOUNTER — Encounter (HOSPITAL_COMMUNITY): Payer: Self-pay | Admitting: Psychiatry

## 2017-01-21 VITALS — BP 142/90 | HR 93 | Ht 69.0 in | Wt 215.2 lb

## 2017-01-21 DIAGNOSIS — Z87891 Personal history of nicotine dependence: Secondary | ICD-10-CM

## 2017-01-21 DIAGNOSIS — M2550 Pain in unspecified joint: Secondary | ICD-10-CM

## 2017-01-21 DIAGNOSIS — F3131 Bipolar disorder, current episode depressed, mild: Secondary | ICD-10-CM | POA: Diagnosis not present

## 2017-01-21 DIAGNOSIS — M549 Dorsalgia, unspecified: Secondary | ICD-10-CM | POA: Diagnosis not present

## 2017-01-21 DIAGNOSIS — F1010 Alcohol abuse, uncomplicated: Secondary | ICD-10-CM

## 2017-01-21 DIAGNOSIS — G4700 Insomnia, unspecified: Secondary | ICD-10-CM | POA: Diagnosis not present

## 2017-01-21 MED ORDER — LAMOTRIGINE 25 MG PO TABS: ORAL_TABLET | ORAL | 0 refills | Status: DC

## 2017-01-21 MED ORDER — GABAPENTIN 300 MG PO CAPS: 300.0000 mg | ORAL_CAPSULE | Freq: Three times a day (TID) | ORAL | 0 refills | Status: DC
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
## 2017-01-29 ENCOUNTER — Ambulatory Visit (INDEPENDENT_AMBULATORY_CARE_PROVIDER_SITE_OTHER): Payer: PPO | Admitting: Clinical

## 2017-01-29 ENCOUNTER — Encounter (HOSPITAL_COMMUNITY): Payer: Self-pay | Admitting: Clinical

## 2017-01-29 DIAGNOSIS — F1020 Alcohol dependence, uncomplicated: Secondary | ICD-10-CM

## 2017-01-29 DIAGNOSIS — F250 Schizoaffective disorder, bipolar type: Secondary | ICD-10-CM

## 2017-01-29 NOTE — Progress Notes (Signed)
   THERAPIST PROGRESS NOTE  Session Time: 9:05-10:30am  Participation Level: Active  Behavioral Response: CasualAlertIrritable  Type of Therapy: Individual Therapy  Treatment Goals addressed: Improve psychiatric symptoms, elevate mood (increased social interaction, increased self-worth), improve unhelpful thought patterns, more positive and optimistic outlook  Interventions: Motivational Interviewing and Other: Grounding and Mindfulness techniques, psychoeducation  Summary: Lucas Small is a 51 y.o. male who presents with Schizoaffective Disorder, Bipolar Type and Alcohol Use Disorder, Severe.   Suicidal/Homicidal: No without intent/plan  Therapist Response:  Legrand Como met with clinician for individual therapy. Lucas Small discussed his psychiatric symptoms and current life events. Client and clinician completed CCA. Lucas Small is a 51 y.o. male who presents with Schizoaffective Disorder, Bipolar Type and Alcohol Use Disorder, Severe. Client reported that he is on disability and has multiple chronic health issues. Client lives alone and was hospitalized in May 2018 for suicidal ideation and alcohol use disorder.   Plan: Return again in 2 weeks.  Diagnosis:     Axis I: Schizoaffective Disorder, Bipolar Type and Alcohol Use Disorder, Severe.   Carlus Pavlov, LCSW 01/29/17  Powell,Frances A, LCSW 01/29/2017
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
## 2017-02-01 DIAGNOSIS — F17200 Nicotine dependence, unspecified, uncomplicated: Secondary | ICD-10-CM | POA: Diagnosis not present

## 2017-02-01 DIAGNOSIS — G4733 Obstructive sleep apnea (adult) (pediatric): Secondary | ICD-10-CM | POA: Diagnosis not present

## 2017-02-12 ENCOUNTER — Other Ambulatory Visit (INDEPENDENT_AMBULATORY_CARE_PROVIDER_SITE_OTHER): Payer: PPO

## 2017-02-12 DIAGNOSIS — E785 Hyperlipidemia, unspecified: Secondary | ICD-10-CM | POA: Diagnosis not present

## 2017-02-12 DIAGNOSIS — E119 Type 2 diabetes mellitus without complications: Secondary | ICD-10-CM

## 2017-02-12 LAB — LIPID PANEL
CHOL/HDL RATIO: 4
Cholesterol: 180 mg/dL (ref 0–200)
HDL: 44.7 mg/dL (ref >39.00)
LDL CALC: 101 mg/dL — AB (ref 0–99)
NonHDL: 135.44
TRIGLYCERIDES: 171 mg/dL — AB (ref 0.0–149.0)
VLDL: 34.2 mg/dL (ref 0.0–40.0)

## 2017-02-12 LAB — COMPREHENSIVE METABOLIC PANEL
ALT: 30 U/L (ref 0–53)
AST: 25 U/L (ref 0–37)
Albumin: 4.3 g/dL (ref 3.5–5.2)
Alkaline Phosphatase: 64 U/L (ref 39–117)
BUN: 26 mg/dL — ABNORMAL HIGH (ref 6–23)
CALCIUM: 10.4 mg/dL (ref 8.4–10.5)
CHLORIDE: 104 meq/L (ref 96–112)
CO2: 25 meq/L (ref 19–32)
CREATININE: 1.17 mg/dL (ref 0.40–1.50)
GFR: 70.07 mL/min (ref >60.00)
Glucose, Bld: 126 mg/dL — ABNORMAL HIGH (ref 70–99)
POTASSIUM: 4 meq/L (ref 3.5–5.1)
Sodium: 137 mEq/L (ref 135–145)
Total Bilirubin: 0.5 mg/dL (ref 0.2–1.2)
Total Protein: 7.6 g/dL (ref 6.0–8.3)

## 2017-02-12 LAB — HEMOGLOBIN A1C: Hgb A1c MFr Bld: 6.8 % — ABNORMAL HIGH (ref 4.6–6.5)

## 2017-02-13 ENCOUNTER — Telehealth: Payer: Self-pay

## 2017-02-13 MED ORDER — FENOFIBRATE 160 MG PO TABS: 160.0000 mg | ORAL_TABLET | Freq: Every day | ORAL | 1 refills | Status: DC

## 2017-02-13 MED ORDER — ROSUVASTATIN CALCIUM 40 MG PO TABS: 40.0000 mg | ORAL_TABLET | Freq: Every day | ORAL | 1 refills | Status: DC

## 2017-02-13 NOTE — Telephone Encounter (Signed)
Patient aware of labs and YL instructions/I faxed increased dosage of Crestor to 40mg  1qd #90+1 and he will add Fish Oil & Flax Seed Oil as well/he will RTO in 3 months/thx dmf

## 2017-02-13 NOTE — Telephone Encounter (Signed)
-----   Message from Ann Held, DO sent at 02/12/2017  8:52 PM EDT ----- Cholesterol--- LDL goal < 70,  HDL >40,  TG < 150.  Diet and exercise will increase HDL and decrease LDL and TG.  Fish,  Fish Oil, Flaxseed oil will also help increase the HDL and decrease Triglycerides.   Recheck labs in 3 months Inc crestor 40 mg #30  1 po qd, 2 refills  Lipid, cmp.

## 2017-02-14 ENCOUNTER — Encounter (HOSPITAL_COMMUNITY): Payer: Self-pay | Admitting: Psychiatry

## 2017-02-14 ENCOUNTER — Ambulatory Visit (INDEPENDENT_AMBULATORY_CARE_PROVIDER_SITE_OTHER): Payer: PPO | Admitting: Psychiatry

## 2017-02-14 VITALS — BP 124/82 | HR 100 | Ht 69.0 in | Wt 216.0 lb

## 2017-02-14 DIAGNOSIS — M2550 Pain in unspecified joint: Secondary | ICD-10-CM

## 2017-02-14 DIAGNOSIS — F17210 Nicotine dependence, cigarettes, uncomplicated: Secondary | ICD-10-CM | POA: Diagnosis not present

## 2017-02-14 DIAGNOSIS — F3131 Bipolar disorder, current episode depressed, mild: Secondary | ICD-10-CM

## 2017-02-14 DIAGNOSIS — M549 Dorsalgia, unspecified: Secondary | ICD-10-CM | POA: Diagnosis not present

## 2017-02-14 DIAGNOSIS — F1010 Alcohol abuse, uncomplicated: Secondary | ICD-10-CM

## 2017-02-14 DIAGNOSIS — F332 Major depressive disorder, recurrent severe without psychotic features: Secondary | ICD-10-CM

## 2017-02-14 MED ORDER — LAMOTRIGINE 100 MG PO TABS: 100.0000 mg | ORAL_TABLET | Freq: Every day | ORAL | 1 refills | Status: DC

## 2017-02-14 MED ORDER — DULOXETINE HCL 40 MG PO CPEP: 40.0000 mg | ORAL_CAPSULE | Freq: Every day | ORAL | 1 refills | Status: DC

## 2017-02-14 MED ORDER — TRAZODONE HCL 150 MG PO TABS: 150.0000 mg | ORAL_TABLET | Freq: Every evening | ORAL | 1 refills | Status: DC | PRN
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
## 2017-02-18 ENCOUNTER — Ambulatory Visit (HOSPITAL_COMMUNITY): Payer: Self-pay | Admitting: Licensed Clinical Social Worker

## 2017-03-03 DIAGNOSIS — F17200 Nicotine dependence, unspecified, uncomplicated: Secondary | ICD-10-CM | POA: Diagnosis not present

## 2017-03-03 DIAGNOSIS — G4733 Obstructive sleep apnea (adult) (pediatric): Secondary | ICD-10-CM | POA: Diagnosis not present

## 2017-03-08 ENCOUNTER — Ambulatory Visit (HOSPITAL_COMMUNITY): Payer: Self-pay | Admitting: Licensed Clinical Social Worker

## 2017-03-08 ENCOUNTER — Telehealth: Payer: Self-pay | Admitting: *Deleted

## 2017-03-08 NOTE — Telephone Encounter (Signed)
Received request for Medical Records from Legal Aid; forwarded to [place] for email/scana/SLS 10/12

## 2017-03-18 ENCOUNTER — Ambulatory Visit: Payer: PPO | Admitting: Pulmonary Disease

## 2017-03-29 ENCOUNTER — Encounter: Payer: Self-pay | Admitting: Pulmonary Disease

## 2017-04-04 ENCOUNTER — Ambulatory Visit (HOSPITAL_COMMUNITY): Payer: Self-pay | Admitting: Licensed Clinical Social Worker

## 2017-04-08 ENCOUNTER — Other Ambulatory Visit: Payer: Self-pay | Admitting: Family Medicine

## 2017-04-08 DIAGNOSIS — E1165 Type 2 diabetes mellitus with hyperglycemia: Principal | ICD-10-CM

## 2017-04-08 DIAGNOSIS — E1151 Type 2 diabetes mellitus with diabetic peripheral angiopathy without gangrene: Secondary | ICD-10-CM

## 2017-04-08 DIAGNOSIS — IMO0002 Reserved for concepts with insufficient information to code with codable children: Secondary | ICD-10-CM

## 2017-04-12 ENCOUNTER — Ambulatory Visit: Payer: Self-pay | Admitting: Pulmonary Disease

## 2017-04-16 ENCOUNTER — Ambulatory Visit (INDEPENDENT_AMBULATORY_CARE_PROVIDER_SITE_OTHER): Payer: PPO | Admitting: Psychiatry

## 2017-04-16 ENCOUNTER — Encounter (HOSPITAL_COMMUNITY): Payer: Self-pay | Admitting: Psychiatry

## 2017-04-16 DIAGNOSIS — F1010 Alcohol abuse, uncomplicated: Secondary | ICD-10-CM

## 2017-04-16 DIAGNOSIS — F17210 Nicotine dependence, cigarettes, uncomplicated: Secondary | ICD-10-CM

## 2017-04-16 DIAGNOSIS — F3131 Bipolar disorder, current episode depressed, mild: Secondary | ICD-10-CM | POA: Diagnosis not present

## 2017-04-16 DIAGNOSIS — Z79899 Other long term (current) drug therapy: Secondary | ICD-10-CM | POA: Diagnosis not present

## 2017-04-16 MED ORDER — GABAPENTIN 300 MG PO CAPS: 300.0000 mg | ORAL_CAPSULE | Freq: Three times a day (TID) | ORAL | 1 refills | Status: DC

## 2017-04-16 MED ORDER — TRAZODONE HCL 150 MG PO TABS: 150.0000 mg | ORAL_TABLET | Freq: Every evening | ORAL | 0 refills | Status: DC | PRN

## 2017-04-16 MED ORDER — LAMOTRIGINE 100 MG PO TABS: 100.0000 mg | ORAL_TABLET | Freq: Every day | ORAL | 0 refills | Status: DC

## 2017-04-16 MED ORDER — DULOXETINE HCL 40 MG PO CPEP: 40.0000 mg | ORAL_CAPSULE | Freq: Every day | ORAL | 0 refills | Status: DC
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
## 2017-04-26 ENCOUNTER — Other Ambulatory Visit: Payer: Self-pay | Admitting: Family Medicine

## 2017-05-03 ENCOUNTER — Ambulatory Visit: Payer: Self-pay | Admitting: Family Medicine

## 2017-05-03 DIAGNOSIS — G4733 Obstructive sleep apnea (adult) (pediatric): Secondary | ICD-10-CM | POA: Diagnosis not present

## 2017-05-03 DIAGNOSIS — F17200 Nicotine dependence, unspecified, uncomplicated: Secondary | ICD-10-CM | POA: Diagnosis not present

## 2017-05-06 ENCOUNTER — Ambulatory Visit (HOSPITAL_COMMUNITY): Payer: Self-pay | Admitting: Licensed Clinical Social Worker

## 2017-05-07 ENCOUNTER — Ambulatory Visit: Payer: Self-pay | Admitting: Family Medicine

## 2017-05-08 ENCOUNTER — Other Ambulatory Visit: Payer: Self-pay

## 2017-05-08 NOTE — Patient Outreach (Signed)
[facility]) Care Management  05/08/2017  Lucas Small [birth year] [id number]   Subjective: I am sure if it is important you will write me a letter.  Objective: none  Assessment: Per referral 51 year old with history of smoking, chronic pain, DM type II, HLD, Bipolar, depression, insomnia muschle spasms, arthritis, COPD, OSA,  Neuropathy, IBS, class I vision impairment, Diverticulitis.   RNCM called to screen and assess care management needs. Client was very hesitant to talk to [facility] and would not confirm any identifiers, stating if it was important, write a letter, then he hung up.  Plan: send unsuccessful outreach letter.  Thea Silversmith, RN, MSN, [place] Coordinator Cell: [phone]

## 2017-05-09 ENCOUNTER — Ambulatory Visit: Payer: PPO | Admitting: Family Medicine

## 2017-05-09 ENCOUNTER — Ambulatory Visit (HOSPITAL_BASED_OUTPATIENT_CLINIC_OR_DEPARTMENT_OTHER)
Admission: RE | Admit: 2017-05-09 | Discharge: 2017-05-09 | Disposition: A | Discharge status: Home / Self Care | Payer: PPO | Source: Ambulatory Visit | Attending: Family Medicine | Admitting: Family Medicine

## 2017-05-09 ENCOUNTER — Encounter: Payer: Self-pay | Admitting: Family Medicine

## 2017-05-09 VITALS — BP 111/73 | HR 111 | Temp 98.2°F | Ht 69.0 in | Wt 214.0 lb

## 2017-05-09 DIAGNOSIS — E1165 Type 2 diabetes mellitus with hyperglycemia: Secondary | ICD-10-CM | POA: Diagnosis not present

## 2017-05-09 DIAGNOSIS — G8929 Other chronic pain: Secondary | ICD-10-CM

## 2017-05-09 DIAGNOSIS — Z8719 Personal history of other diseases of the digestive system: Secondary | ICD-10-CM | POA: Diagnosis not present

## 2017-05-09 DIAGNOSIS — Z915 Personal history of self-harm: Secondary | ICD-10-CM | POA: Insufficient documentation

## 2017-05-09 DIAGNOSIS — N200 Calculus of kidney: Secondary | ICD-10-CM | POA: Diagnosis not present

## 2017-05-09 DIAGNOSIS — M5441 Lumbago with sciatica, right side: Secondary | ICD-10-CM

## 2017-05-09 DIAGNOSIS — E1151 Type 2 diabetes mellitus with diabetic peripheral angiopathy without gangrene: Secondary | ICD-10-CM | POA: Diagnosis not present

## 2017-05-09 DIAGNOSIS — Z9151 Personal history of suicidal behavior: Secondary | ICD-10-CM | POA: Insufficient documentation

## 2017-05-09 DIAGNOSIS — E785 Hyperlipidemia, unspecified: Secondary | ICD-10-CM

## 2017-05-09 DIAGNOSIS — R140 Abdominal distension (gaseous): Secondary | ICD-10-CM | POA: Diagnosis not present

## 2017-05-09 DIAGNOSIS — IMO0002 Reserved for concepts with insufficient information to code with codable children: Secondary | ICD-10-CM

## 2017-05-09 DIAGNOSIS — M545 Low back pain: Secondary | ICD-10-CM | POA: Diagnosis not present

## 2017-05-09 MED ORDER — CYCLOBENZAPRINE HCL 10 MG PO TABS: 10.0000 mg | ORAL_TABLET | Freq: Three times a day (TID) | ORAL | 0 refills | Status: DC | PRN

## 2017-05-09 MED ORDER — TRAMADOL HCL 50 MG PO TABS: 50.0000 mg | ORAL_TABLET | Freq: Three times a day (TID) | ORAL | 0 refills | Status: DC | PRN

## 2017-05-09 NOTE — Patient Instructions (Signed)

## 2017-05-09 NOTE — Progress Notes (Signed)
Subjective:  I acted as a Education administrator for [employer], [place]   Patient ID: Lucas Small, male    DOB: 11-15-1965, 51 y.o.   MRN: [id number]  Chief Complaint  Patient presents with  . Back Pain  . Referral    Wants referral to GI     HPI  Patient is in today for back pain  He said it feels like a pulled muscle-- no known injury-- he was just started to try to exercise and pain became worse.   He also c/o his "digestive system stopped' and he gets nauseous and has had a lot of gas with it.  He is requesting to see GI-- he was seeing a surgeon in [place] but that is too far for him to go now and since he does not want surgery there was nothing else to do --- pt has hx mult abd surgeries  He denies constipation -- he says his "digestion just stops"  -- this comes and goes and causes discomfort.  He has not tried any otc gas ex or beano etc..  .  Pt is also c/o back pain.    Patient Care Team: Carollee Herter, Alferd Apa, DO as PCP - General (Family Medicine)   Past Medical History:  Diagnosis Date  . Alcoholic ([place])   . CHF (congestive heart failure) ([place])   . Depression   . Diabetes mellitus without complication (HCC)    metformin  . Dyspnea    with exertion .  Marland Kitchen Dysrhythmia    while hospitalized in [place]  . History of kidney stones   . Hypertension   . MRSA (methicillin resistant Staphylococcus aureus)   . Multiple myeloma ([place]) 1990's  . Perforation bowel ([place])   . PNA (pneumonia)   . Renal disorder   . Renal failure   . Renal insufficiency   . Respiratory failure ([place])   . Sickle cell anemia (HCC)   . Sleep apnea    no CPAP machine    Past Surgical History:  Procedure Laterality Date  . ABDOMINAL SURGERY    . CARDIAC SURGERY    . HERNIA REPAIR    . ILEOSTOMY    . PERIPHERALLY INSERTED CENTRAL CATHETER INSERTION      Family History  Problem Relation Age of Onset  . Heart disease Mother   . Heart failure Mother     Social History   Socioeconomic  History  . Marital status: Divorced    Spouse name: Not on file  . Number of children: Not on file  . Years of education: Not on file  . Highest education level: Not on file  Social Needs  . Financial resource strain: Not on file  . Food insecurity - worry: Not on file  . Food insecurity - inability: Not on file  . Transportation needs - medical: Not on file  . Transportation needs - non-medical: Not on file  Occupational History    Comment: disabled  Tobacco Use  . Smoking status: Current Every Day Smoker    Packs/day: 1.50    Years: 28.00    Pack years: 42.00    Types: Cigarettes  . Smokeless tobacco: Never Used  . Tobacco comment: Now using patches and down to 1-2 cigarettes a day  Substance and Sexual Activity  . Alcohol use: Yes    Alcohol/week: 4.2 oz    Types: 7 Cans of beer per week    Comment: occasionally  . Drug use: No  Comment: THC cocaine  . Sexual activity: Not Currently    Comment: Not asked  Other Topics Concern  . Not on file  Social History Narrative   Exercise---  Walking some --  Not enough    Outpatient Medications Prior to Visit  Medication Sig Dispense Refill  . DULoxetine HCl 40 MG CPEP Take 40 mg by mouth daily. 90 capsule 0  . fenofibrate 160 MG tablet Take 1 tablet (160 mg total) by mouth daily. 90 tablet 1  . gabapentin (NEURONTIN) 300 MG capsule Take 1 capsule (300 mg total) by mouth 3 (three) times daily. 90 capsule 1  . lamoTRIgine (LAMICTAL) 100 MG tablet Take 1 tablet (100 mg total) by mouth daily. 90 tablet 0  . metFORMIN (GLUCOPHAGE) 1000 MG tablet Take 1,000 mg by mouth 2 (two) times daily with a meal.    . metFORMIN (GLUCOPHAGE) 500 MG tablet TAKE 1 TABLET(500 MG) BY MOUTH TWICE DAILY WITH A MEAL 180 tablet 0  . nicotine (NICODERM CQ - DOSED IN MG/24 HOURS) 21 mg/24hr patch Place 1 patch (21 mg total) onto the skin daily. 28 patch 0  . ONETOUCH DELICA LANCETS FINE MISC USE AS DIRECTED TO CHECK BLOOD SUGAR TWICE DAILY 100 each 0  .  ONETOUCH VERIO test strip USE TWICE DAILY AS DIRECTED 100 each 1  . pantoprazole (PROTONIX) 40 MG tablet Take 40 mg by mouth daily.     . rosuvastatin (CRESTOR) 40 MG tablet Take 1 tablet (40 mg total) by mouth daily. 90 tablet 1  . traZODone (DESYREL) 150 MG tablet Take 1 tablet (150 mg total) by mouth at bedtime as needed for sleep. 90 tablet 0  . cyclobenzaprine (FLEXERIL) 10 MG tablet Take 1 tablet (10 mg total) by mouth 3 (three) times daily as needed for muscle spasms. 30 tablet 0   No facility-administered medications prior to visit.     Allergies  Allergen Reactions  . Bee Venom Anaphylaxis    Review of Systems  Constitutional: Negative for chills, fever and malaise/fatigue.  HENT: Negative for congestion and hearing loss.   Eyes: Negative for discharge.  Respiratory: Negative for cough, sputum production and shortness of breath.   Cardiovascular: Negative for chest pain, palpitations and leg swelling.  Gastrointestinal: Positive for abdominal pain and nausea. Negative for blood in stool, constipation, diarrhea, heartburn and vomiting.  Genitourinary: Negative for dysuria, frequency, hematuria and urgency.  Musculoskeletal: Positive for back pain. Negative for falls and myalgias.  Skin: Negative for rash.  Neurological: Negative for dizziness, sensory change, loss of consciousness, weakness and headaches.  Endo/Heme/Allergies: Negative for environmental allergies. Does not bruise/bleed easily.  Psychiatric/Behavioral: Negative for depression and suicidal ideas. The patient is not nervous/anxious and does not have insomnia.        Objective:    Physical Exam  Constitutional: He is oriented to person, place, and time. Vital signs are normal. He appears well-developed and well-nourished. He is sleeping.  HENT:  Head: Normocephalic and atraumatic.  Mouth/Throat: Oropharynx is clear and moist.  Eyes: EOM are normal. Pupils are equal, round, and reactive to light.  Neck:  Normal range of motion. Neck supple. No thyromegaly present.  Cardiovascular: Normal rate and regular rhythm.  No murmur heard. Pulmonary/Chest: Effort normal and breath sounds normal. No respiratory distress. He has no wheezes. He has no rales. He exhibits no tenderness.  Abdominal: Soft. He exhibits no distension and no mass. There is no tenderness. There is no rebound and no guarding.  Musculoskeletal: He  exhibits tenderness. He exhibits no edema.       Lumbar back: He exhibits tenderness, pain and spasm.       Back:  Neurological: He is alert and oriented to person, place, and time. He displays normal reflexes.  Painful gait Pain with sitting/ standing + SLR on L Leg   Skin: Skin is warm and dry.  Psychiatric: He has a normal mood and affect. His behavior is normal. Judgment and thought content normal.  Nursing note and vitals reviewed.   BP 111/73   Pulse (!) 111   Temp 98.2 F (36.8 C) (Oral)   Ht _0  (1.753 m)   Wt 214 lb (97.1 kg)   SpO2 98%   BMI 31.60 kg/m  Wt Readings from Last 3 Encounters:  05/09/17 214 lb (97.1 kg)  04/16/17 219 lb (99.3 kg)  02/14/17 216 lb (98 kg)   BP Readings from Last 3 Encounters:  05/09/17 111/73  04/16/17 122/84  02/14/17 124/82     Immunization History  Administered Date(s) Administered  . PPD Test 08/28/2011  . Pneumococcal Polysaccharide-23 02/13/2016    Health Maintenance  Topic Date Due  . TETANUS/TDAP  11/16/1985  . COLONOSCOPY  11/16/2016  . FOOT EXAM  02/12/2017  . URINE MICROALBUMIN  02/12/2017  . OPHTHALMOLOGY EXAM  02/19/2017  . INFLUENZA VACCINE  04/27/2018 (Originally 12/26/2016)  . HEMOGLOBIN A1C  08/12/2017  . PNEUMOCOCCAL POLYSACCHARIDE VACCINE (2) 02/12/2021  . HIV Screening  Completed    Lab Results  Component Value Date   WBC 8.6 12/28/2016   HGB 17.6 (H) 12/28/2016   HCT 48.8 12/28/2016   PLT 278 12/28/2016   GLUCOSE 126 (H) 02/12/2017   CHOL 180 02/12/2017   TRIG 171.0 (H) 02/12/2017   HDL  44.70 02/12/2017   LDLCALC 101 (H) 02/12/2017   ALT 30 02/12/2017   AST 25 02/12/2017   NA 137 02/12/2017   K 4.0 02/12/2017   CL 104 02/12/2017   CREATININE 1.17 02/12/2017   BUN 26 (H) 02/12/2017   CO2 25 02/12/2017   TSH 1.59 02/13/2016   INR 1.11 10/14/2010   HGBA1C 6.8 (H) 02/12/2017   MICROALBUR 2.5 (H) 02/13/2016    Lab Results  Component Value Date   TSH 1.59 02/13/2016   Lab Results  Component Value Date   WBC 8.6 12/28/2016   HGB 17.6 (H) 12/28/2016   HCT 48.8 12/28/2016   MCV 90.0 12/28/2016   PLT 278 12/28/2016   Lab Results  Component Value Date   NA 137 02/12/2017   K 4.0 02/12/2017   CO2 25 02/12/2017   GLUCOSE 126 (H) 02/12/2017   BUN 26 (H) 02/12/2017   CREATININE 1.17 02/12/2017   BILITOT 0.5 02/12/2017   ALKPHOS 64 02/12/2017   AST 25 02/12/2017   ALT 30 02/12/2017   PROT 7.6 02/12/2017   ALBUMIN 4.3 02/12/2017   CALCIUM 10.4 02/12/2017   ANIONGAP 14 12/28/2016   GFR 70.07 02/12/2017   Lab Results  Component Value Date   CHOL 180 02/12/2017   Lab Results  Component Value Date   HDL 44.70 02/12/2017   Lab Results  Component Value Date   LDLCALC 101 (H) 02/12/2017   Lab Results  Component Value Date   TRIG 171.0 (H) 02/12/2017   Lab Results  Component Value Date   CHOLHDL 4 02/12/2017   Lab Results  Component Value Date   HGBA1C 6.8 (H) 02/12/2017         Assessment & Plan:  Problem List Items Addressed This Visit      Unprioritized   Back pain   Relevant Medications   cyclobenzaprine (FLEXERIL) 10 MG tablet   traMADol (ULTRAM) 50 MG tablet   Other Relevant Orders   DG Lumbar Spine Complete   Ambulatory referral to Physical Therapy   DM (diabetes mellitus) type II uncontrolled, periph vascular disorder (HCC)   Relevant Orders   Lipid panel   Hemoglobin A1c   Comprehensive metabolic panel   Hyperlipidemia LDL goal <70   Relevant Orders   Lipid panel   Hemoglobin A1c   Comprehensive metabolic panel      Other Visit Diagnoses    History of diverticulitis    -  Primary   Relevant Orders   Ambulatory referral to Gastroenterology   Abdominal bloating       Relevant Orders   Ambulatory referral to Gastroenterology      I am having Lucas Small start on traMADol. I am also having him maintain his nicotine, pantoprazole, metFORMIN, rosuvastatin, fenofibrate, metFORMIN, ONETOUCH DELICA LANCETS FINE, lamoTRIgine, DULoxetine HCl, gabapentin, traZODone, ONETOUCH VERIO, and cyclobenzaprine.  Meds ordered this encounter  Medications  . cyclobenzaprine (FLEXERIL) 10 MG tablet    Sig: Take 1 tablet (10 mg total) by mouth 3 (three) times daily as needed for muscle spasms.    Dispense:  30 tablet    Refill:  0  . traMADol (ULTRAM) 50 MG tablet    Sig: Take 1 tablet (50 mg total) by mouth every 8 (eight) hours as needed.    Dispense:  30 tablet    Refill:  0    CMA served as scribe during this visit. History, Physical and Plan performed by medical provider. Documentation and orders reviewed and attested to.  Ann Held, DO

## 2017-05-10 LAB — LIPID PANEL
CHOL/HDL RATIO: 5
Cholesterol: 211 mg/dL — ABNORMAL HIGH (ref 0–200)
HDL: 44 mg/dL (ref >39.00)
NONHDL: 166.85
Triglycerides: 313 mg/dL — ABNORMAL HIGH (ref 0.0–149.0)
VLDL: 62.6 mg/dL — AB (ref 0.0–40.0)

## 2017-05-10 LAB — COMPREHENSIVE METABOLIC PANEL
ALT: 39 U/L (ref 0–53)
AST: 26 U/L (ref 0–37)
Albumin: 4.5 g/dL (ref 3.5–5.2)
Alkaline Phosphatase: 71 U/L (ref 39–117)
BUN: 26 mg/dL — AB (ref 6–23)
CALCIUM: 11.1 mg/dL — AB (ref 8.4–10.5)
CHLORIDE: 100 meq/L (ref 96–112)
CO2: 30 meq/L (ref 19–32)
CREATININE: 1.32 mg/dL (ref 0.40–1.50)
GFR: 60.9 mL/min (ref >60.00)
Glucose, Bld: 156 mg/dL — ABNORMAL HIGH (ref 70–99)
POTASSIUM: 4.9 meq/L (ref 3.5–5.1)
SODIUM: 136 meq/L (ref 135–145)
Total Bilirubin: 0.4 mg/dL (ref 0.2–1.2)
Total Protein: 7.9 g/dL (ref 6.0–8.3)

## 2017-05-10 LAB — LDL CHOLESTEROL, DIRECT: LDL DIRECT: 156 mg/dL

## 2017-05-10 LAB — HEMOGLOBIN A1C: Hgb A1c MFr Bld: 7 % — ABNORMAL HIGH (ref 4.6–6.5)

## 2017-05-14 ENCOUNTER — Other Ambulatory Visit: Payer: Self-pay

## 2017-05-14 DIAGNOSIS — IMO0002 Reserved for concepts with insufficient information to code with codable children: Secondary | ICD-10-CM

## 2017-05-14 DIAGNOSIS — E1151 Type 2 diabetes mellitus with diabetic peripheral angiopathy without gangrene: Secondary | ICD-10-CM

## 2017-05-14 DIAGNOSIS — E1165 Type 2 diabetes mellitus with hyperglycemia: Principal | ICD-10-CM

## 2017-05-14 MED ORDER — SITAGLIPTIN PHOSPHATE 100 MG PO TABS: 100.0000 mg | ORAL_TABLET | Freq: Every day | ORAL | 1 refills | Status: DC

## 2017-05-14 MED ORDER — EZETIMIBE 10 MG PO TABS: 10.0000 mg | ORAL_TABLET | Freq: Every day | ORAL | 1 refills | Status: DC

## 2017-05-29 ENCOUNTER — Other Ambulatory Visit: Payer: Self-pay | Admitting: Family Medicine

## 2017-05-29 ENCOUNTER — Encounter: Payer: Self-pay | Admitting: Physical Therapy

## 2017-05-29 ENCOUNTER — Ambulatory Visit: Payer: PPO | Attending: Family Medicine | Admitting: Physical Therapy

## 2017-05-29 DIAGNOSIS — R262 Difficulty in walking, not elsewhere classified: Secondary | ICD-10-CM | POA: Insufficient documentation

## 2017-05-29 DIAGNOSIS — R209 Unspecified disturbances of skin sensation: Secondary | ICD-10-CM | POA: Diagnosis not present

## 2017-05-29 DIAGNOSIS — M6281 Muscle weakness (generalized): Secondary | ICD-10-CM | POA: Insufficient documentation

## 2017-05-29 DIAGNOSIS — M5440 Lumbago with sciatica, unspecified side: Secondary | ICD-10-CM | POA: Insufficient documentation

## 2017-05-29 DIAGNOSIS — G8929 Other chronic pain: Secondary | ICD-10-CM | POA: Insufficient documentation

## 2017-05-29 NOTE — Patient Instructions (Signed)
Lumbar Rotation (Non-Weight Bearing)    Feet on floor, slowly rock knees from side to side in small, pain-free range of motion. Allow lower back to rotate slightly. Repeat __10__ times per set. Do _1___ sets per session. Do __2__ sessions per day.  http://orth.exer.us/160   Copyright  VHI. All rights reserved.     Pelvic Tilt: Posterior - Legs Bent (Supine)    Tighten stomach and flatten back by rolling pelvis down. Hold ___5_ seconds. Relax. Repeat __10__ times per set. Do __1__ sets per session. Do ___2_ sessions per day.  http://orth.exer.us/202   Copyright  VHI. All rights reserved.  Angry Cat, All Fours   Kneel on hands and knees. Tuck chin and tighten stomach. Exhale and round back upward. Inhale and arch back downward. Hold each position ___ seconds. Repeat _5-10__ times per session. Do __2_ sessions per day.  Copyright  VHI. All rights reserved.    Flexion   Sitting on knees, fold body over legs and relax head and arms on floor. Extend arms as far forward as possible. Hold __30__ seconds. Repeat __3__ times. Do _1-2___ sessions per day.  Copyright  VHI. All rights reserved.   Both hands to the Rt. Side and repeat this exercise

## 2017-05-29 NOTE — Therapy (Signed)
[place] [place], [place], [address] Phone: [phone]   Fax:  [phone]  Physical Therapy Evaluation  Patient Details  Name: Lucas Small MRN: [id number] Date of Birth: [birth year] Referring Provider: Roma Schanz, DO    Encounter Date: 05/29/2017  PT End of Session - 05/29/17 1143    Visit Number  1    Number of Visits  12    Date for PT Re-Evaluation  07/10/17    PT Start Time  1100    PT Stop Time  1150    PT Time Calculation (min)  50 min    Activity Tolerance  Patient tolerated treatment well    Behavior During Therapy  [facility] for tasks assessed/performed       Past Medical History:  Diagnosis Date  . Alcoholic ([place])   . CHF (congestive heart failure) ([place])   . Depression   . Diabetes mellitus without complication (HCC)    metformin  . Dyspnea    with exertion .  Marland Kitchen Dysrhythmia    while hospitalized in [place]  . History of kidney stones   . Hypertension   . MRSA (methicillin resistant Staphylococcus aureus)   . Multiple myeloma ([place]) 1990's  . Perforation bowel ([place])   . PNA (pneumonia)   . Renal disorder   . Renal failure   . Renal insufficiency   . Respiratory failure ([place])   . Sickle cell anemia (HCC)   . Sleep apnea    no CPAP machine    Past Surgical History:  Procedure Laterality Date  . ABDOMINAL SURGERY    . CARDIAC SURGERY    . HERNIA REPAIR    . ILEOSTOMY    . PERIPHERALLY INSERTED CENTRAL CATHETER INSERTION      There were no vitals filed for this visit.   Subjective Assessment - 05/29/17 1107    Subjective  Patient presents with acute on chronic back pain which he attributes to fairly recent attempt to increase walking (pulmonologist requested).  He has had this pain for about 6 mos.  He has Rt. leg nerve pain which has been ongoing for 5 yrs, unrelated.   Pain is on L side more than R.   Min weakness noticed and he feels it is just from being inactive.  Rt.  Leg numb/tingling due to chronic Rt LE pain.     Pertinent History  Multiple surgeries (16) in 5 yrs, HTN, DM, substance abuse, GI problems    Limitations  Sitting;Lifting;Standing;Walking;House hold activities    How long can you sit comfortably?  not at all during eval     How long can you stand comfortably?  better than sitting but limited     How long can you walk comfortably?  does not do any longer than he needs to in his apt.     Diagnostic tests  XR done , unremarkable    Patient Stated Goals  Would like less back pain. Try to continue exercising.      Currently in Pain?  Yes    Pain Score  7     Pain Location  Back    Pain Orientation  Right;Left    Pain Descriptors / Indicators  Tightness;Sharp;Cramping    Pain Type  Chronic pain    Pain Radiating Towards  incidental R leg.      Pain Onset  More than a month ago    Pain Frequency  Constant    Aggravating Factors  sitting, standing , walking, bending     Pain Relieving Factors  lying down          [facility] PT Assessment - 05/29/17 0001      Assessment   Medical Diagnosis  low back pain     Referring Provider  Roma Schanz, DO     Onset Date/Surgical Date  -- 6 mos     Prior Therapy  Many yrs ago      Precautions   Precautions  None    Precaution Comments  did have a seizure but not in a long time       Restrictions   Weight Bearing Restrictions  No      Balance Screen   Has the patient fallen in the past 6 months  No    Has the patient had a decrease in activity level because of a fear of falling?   Yes    Is the patient reluctant to leave their home because of a fear of falling?   No      Home Environment   Living Environment  Private residence    Living Arrangements  Non-relatives/Friends    Type of [place]  One level    Additional Comments  has a roommate and has some difficulty lifting, reaching at times, roommate has pain too       Prior Function   Level of Independence   Independent with basic ADLs;Independent with household mobility without device;Independent with community mobility without device    Vocation  On disability    Leisure  sedentary, enjoys gaming, movies , cooking       Cognition   Overall Cognitive Status  Within Functional Limits for tasks assessed      Observation/Other Assessments   Focus on Therapeutic Outcomes (FOTO)   65%      Sensation   Light Touch  Impaired by gross assessment    Additional Comments  subjectively reports Rt. LE numbness and tingling, chronic       Posture/Postural Control   Posture Comments  hyperlordosis       AROM   Lumbar Flexion  50%    Lumbar Extension  75%    Lumbar - Right Side Bend  50%    Lumbar - Left Side Bend  75% pain on L     Lumbar - Right Rotation  25% min pain     Lumbar - Left Rotation  50% pain       PROM   Overall PROM Comments  good hip ER and IR bilaterally       Strength   Right Hip Flexion  2+/5    Left Hip Flexion  3-/5    Left Hip ABduction  3-/5    Right Knee Flexion  4+/5    Right Knee Extension  4+/5    Left Knee Flexion  4+/5    Left Knee Extension  4+/5    Right Ankle Dorsiflexion  5/5    Left Ankle Dorsiflexion  5/5      Flexibility   Hamstrings  45-50 deg     Quadratus Lumborum  painful, tight L side       Palpation   Palpation comment  sore TTP L low lumbar  and into L gluteals , pain L QL and extending up into thoracic on L side       Special Tests    Special Tests  Lumbar  Straight Leg Raise   Findings  Negative    Comment  pain in back with Active SLR , neg SLR bilaterally with PROM       Bed Mobility   Bed Mobility  -- I with good body mechanics and strategies              Objective measurements completed on examination: See above findings.      [facility] Adult PT Treatment/Exercise - 05/29/17 0001      Self-Care   Self-Care  Posture;Heat/Ice Application;Other Self-Care Comments    Other Self-Care Comments   HEP , eval findings        Lumbar Exercises: Stretches   Lower Trunk Rotation  10 seconds    Lower Trunk Rotation Limitations  x 10     Pelvic Tilt  10 seconds    Pelvic Tilt Limitations  x 10       Lumbar Exercises: Quadruped   Madcat/Old Horse  10 reps    Other Quadruped Lumbar Exercises  childs pose FW and lateral       Modalities   Modalities  Moist Heat      Moist Heat Therapy   Number Minutes Moist Heat  15 Minutes    Moist Heat Location  Lumbar Spine             PT Education - 05/29/17 1142    Education provided  Yes    Education Details  PT/POC, HEP, posture and walking     Person(s) Educated  Patient    Methods  Explanation;Demonstration;Handout    Comprehension  Verbalized understanding;Tactile cues required;Need further instruction          PT Long Term Goals - 05/29/17 1143      PT LONG TERM GOAL #1   Title  Pt will be I with HEP for core and hip strength     Time  6    Period  Weeks    Status  New    Target Date  07/10/17      PT LONG TERM GOAL #2   Title  Pt will be able to improve FOTO score to 50% limited or better.     Time  6    Period  Weeks    Status  New    Target Date  07/10/17      PT LONG TERM GOAL #3   Title  Pt will be able to sit comfortably for 30 min in msot types of chairs.     Time  6    Period  Weeks    Status  New    Target Date  07/10/17      PT LONG TERM GOAL #4   Title  Pt will be able to walk 1 mile and report min pain increase in back pain.     Time  6    Period  Weeks    Status  New    Target Date  07/10/17             Plan - 05/29/17 1145    Clinical Impression Statement  Pt presents for low complexity eval of chronic low back pain which has worsened after he began a walking program. He was walking 1 mile and pain began to worsen.  He feels it was muscular.  It is complicated by the fact that he has Rt LE nerve pain from previous injury and is deconditioned from a number of chronic health issues. He has very weak  abdominals,  prior surgery, compensates with hyperlordosis in standing.  He will benefit from PT to improve functional mobility and pain for ADLs, walking.     Clinical Presentation  Stable    Clinical Decision Making  Low    Rehab Potential  Good    PT Frequency  2x / week    PT Duration  6 weeks    PT Treatment/Interventions  ADLs/Self Care Home Management;Cryotherapy;Ultrasound;Traction;Moist Heat;Stair training;Balance training;Dry needling;Passive range of motion;Manual techniques;Patient/family education;Therapeutic exercise;Functional mobility training;Neuromuscular re-education;Therapeutic activities;Electrical Stimulation    PT Next Visit Plan  check HEP, manual in sidelying , stretching into flexion , develop HEP for core , heat     PT Home Exercise Plan  quadruped  and post pelvic tilt     Consulted and Agree with Plan of Care  Patient       Patient will benefit from skilled therapeutic intervention in order to improve the following deficits and impairments:  Abnormal gait, Decreased mobility, Difficulty walking, Increased muscle spasms, Obesity, Pain, Postural dysfunction, Impaired flexibility, Increased fascial restricitons, Decreased strength, Decreased activity tolerance, Decreased range of motion, Improper body mechanics, Impaired sensation, Hypomobility, Decreased endurance, Decreased balance, Dizziness  Visit Diagnosis: Difficulty in walking, not elsewhere classified  Muscle weakness (generalized)  Chronic bilateral low back pain with sciatica, sciatica laterality unspecified  Unspecified disturbances of skin sensation     Problem List Patient Active Problem List   Diagnosis Date Noted  . History of suicide attempt   . Echocardiogram shows left ventricular diastolic dysfunction 27/51/7001  . Alcohol abuse with alcohol-induced mood disorder ([place]) 12/28/2016  . Obesity (BMI 30-39.9) 11/13/2016  . MDD (major depressive disorder), recurrent severe, without psychosis ([place])  10/05/2016  . OSA (obstructive sleep apnea) 04/25/2016  . Back pain 12/15/2015  . GERD (gastroesophageal reflux disease) 12/15/2015  . DM (diabetes mellitus) type II uncontrolled, periph vascular disorder ([place]) 12/15/2015  . Hyperlipidemia LDL goal <70 12/15/2015  . Diverticulitis 03/05/2013  . Tobacco dependency 03/05/2013  . DOE (dyspnea on exertion) 03/05/2013  . Major depression, recurrent ([place]) 11/24/2012  . Snoring disorder 08/27/2011  . Substance induced mood disorder ([place]) 08/23/2011    Class: Acute  . Alcohol use disorder, severe, dependence ([place]) 08/23/2011    Class: Acute    Keily Lepp 05/29/2017, 3:37 PM  [facility] [address] [place], [place], [address] Phone: [phone]   Fax:  [phone]  Name: Lucas Small MRN: [id number] Date of Birth: 1965/08/30   Raeford Razor, PT 05/29/17 3:37 PM Phone: [phone] Fax: [phone]

## 2017-06-03 ENCOUNTER — Ambulatory Visit (INDEPENDENT_AMBULATORY_CARE_PROVIDER_SITE_OTHER): Payer: PPO | Admitting: Licensed Clinical Social Worker

## 2017-06-03 DIAGNOSIS — F3131 Bipolar disorder, current episode depressed, mild: Secondary | ICD-10-CM

## 2017-06-04 ENCOUNTER — Encounter: Payer: Self-pay | Admitting: Physical Therapy

## 2017-06-04 ENCOUNTER — Encounter (HOSPITAL_COMMUNITY): Payer: Self-pay | Admitting: Licensed Clinical Social Worker

## 2017-06-04 ENCOUNTER — Ambulatory Visit: Payer: PPO | Admitting: Physical Therapy

## 2017-06-04 DIAGNOSIS — G8929 Other chronic pain: Secondary | ICD-10-CM

## 2017-06-04 DIAGNOSIS — R262 Difficulty in walking, not elsewhere classified: Secondary | ICD-10-CM | POA: Diagnosis not present

## 2017-06-04 DIAGNOSIS — R209 Unspecified disturbances of skin sensation: Secondary | ICD-10-CM

## 2017-06-04 DIAGNOSIS — M6281 Muscle weakness (generalized): Secondary | ICD-10-CM

## 2017-06-04 DIAGNOSIS — M5440 Lumbago with sciatica, unspecified side: Secondary | ICD-10-CM
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
## 2017-06-04 NOTE — Therapy (Signed)
[place], [place], [address] Phone: [phone]   Fax:  [phone]  Physical Therapy Treatment  Patient Details  Name: Lucas Small MRN: [id number] Date of Birth: 02-24-1966 Referring Provider: Roma Schanz, DO    Encounter Date: 06/04/2017  PT End of Session - 06/04/17 0936    Visit Number  2    Number of Visits  12    Date for PT Re-Evaluation  07/10/17    PT Start Time  0930    PT Stop Time  1030    PT Time Calculation (min)  60 min       Past Medical History:  Diagnosis Date  . Alcoholic ([place])   . CHF (congestive heart failure) ([place])   . Depression   . Diabetes mellitus without complication (HCC)    metformin  . Dyspnea    with exertion .  Marland Kitchen Dysrhythmia    while hospitalized in [place]  . History of kidney stones   . Hypertension   . MRSA (methicillin resistant Staphylococcus aureus)   . Multiple myeloma ([place]) 1990's  . Perforation bowel ([place])   . PNA (pneumonia)   . Renal disorder   . Renal failure   . Renal insufficiency   . Respiratory failure ([place])   . Sickle cell anemia (HCC)   . Sleep apnea    no CPAP machine    Past Surgical History:  Procedure Laterality Date  . ABDOMINAL SURGERY    . CARDIAC SURGERY    . HERNIA REPAIR    . ILEOSTOMY    . PERIPHERALLY INSERTED CENTRAL CATHETER INSERTION      There were no vitals filed for this visit.  Subjective Assessment - 06/04/17 0934    Subjective  I took pain medicine an hour ago. Pain is 5/10 now. Pain meds help stomach cramps and back pain.     Pain Score  5     Pain Orientation  Right;Left    Pain Descriptors / Indicators  Tightness;Cramping;Dull    Aggravating Factors   sitting, standing, walking, bending    Pain Relieving Factors  lying down.                       [facility] Adult PT Treatment/Exercise - 06/04/17 0001      Lumbar Exercises: Stretches   Lower Trunk Rotation  10 seconds    Lower  Trunk Rotation Limitations  x 10       Lumbar Exercises: Aerobic   Stationary Bike   Nustep L 3 UE/LE x 5 minutes       Lumbar Exercises: Supine   Ab Set  10 reps;5 seconds    AB Set Limitations  cues for breathing       Lumbar Exercises: Quadruped   Madcat/Old Horse  10 reps    Other Quadruped Lumbar Exercises  childs pose FW and lateral       Moist Heat Therapy   Number Minutes Moist Heat  15 Minutes    Moist Heat Location  -- left gluteal       Manual Therapy   Manual Therapy  Soft tissue mobilization    Soft tissue mobilization  Tennis ball to left gluteals with intermittent TPR                   PT Long Term Goals - 05/29/17 1143      PT LONG TERM GOAL #1   Title  Pt will be I with HEP for core and hip strength     Time  6    Period  Weeks    Status  New    Target Date  07/10/17      PT LONG TERM GOAL #2   Title  Pt will be able to improve FOTO score to 50% limited or better.     Time  6    Period  Weeks    Status  New    Target Date  07/10/17      PT LONG TERM GOAL #3   Title  Pt will be able to sit comfortably for 30 min in msot types of chairs.     Time  6    Period  Weeks    Status  New    Target Date  07/10/17      PT LONG TERM GOAL #4   Title  Pt will be able to walk 1 mile and report min pain increase in back pain.     Time  6    Period  Weeks    Status  New    Target Date  07/10/17            Plan - 06/04/17 6073    Clinical Impression Statement  Pt reports performing HEP 2 times since evaluations. Began Nustep  with pt reporting discomfort in mid-low back after 4 minutes. Reviewed quadruped stretches and reminded pt of lateral childs pose. Stretches only tolerated for approx 10 seconds. Difficulty with abdominal bracing due to multiple abdominal surgeries. Did well with glut sets. Spent time in left sideylying for manual soft tissue work and gentle trigger point release followed by moist heat.     PT Next Visit Plan  check HEP,  manual in sidelying , stretching into flexion , develop HEP for core , heat     PT Home Exercise Plan  quadruped  and post pelvic tilt     Consulted and Agree with Plan of Care  Patient       Patient will benefit from skilled therapeutic intervention in order to improve the following deficits and impairments:  Abnormal gait, Decreased mobility, Difficulty walking, Increased muscle spasms, Obesity, Pain, Postural dysfunction, Impaired flexibility, Increased fascial restricitons, Decreased strength, Decreased activity tolerance, Decreased range of motion, Improper body mechanics, Impaired sensation, Hypomobility, Decreased endurance, Decreased balance, Dizziness  Visit Diagnosis: Difficulty in walking, not elsewhere classified  Muscle weakness (generalized)  Chronic bilateral low back pain with sciatica, sciatica laterality unspecified  Unspecified disturbances of skin sensation     Problem List Patient Active Problem List   Diagnosis Date Noted  . History of suicide attempt   . Echocardiogram shows left ventricular diastolic dysfunction 71/10/2692  . Alcohol abuse with alcohol-induced mood disorder ([place]) 12/28/2016  . Obesity (BMI 30-39.9) 11/13/2016  . MDD (major depressive disorder), recurrent severe, without psychosis ([place]) 10/05/2016  . OSA (obstructive sleep apnea) 04/25/2016  . Back pain 12/15/2015  . GERD (gastroesophageal reflux disease) 12/15/2015  . DM (diabetes mellitus) type II uncontrolled, periph vascular disorder ([place]) 12/15/2015  . Hyperlipidemia LDL goal <70 12/15/2015  . Diverticulitis 03/05/2013  . Tobacco dependency 03/05/2013  . DOE (dyspnea on exertion) 03/05/2013  . Major depression, recurrent ([place]) 11/24/2012  . Snoring disorder 08/27/2011  . Substance induced mood disorder ([place]) 08/23/2011    Class: Acute  . Alcohol use disorder, severe, dependence ([place]) 08/23/2011    Class: Acute    Dorene Ar, PTA 06/04/2017,  11:30 AM  [facility] [address] [place], [place], [address] Phone: [phone]   Fax:  [phone]  Name: BERYLE ZEITZ MRN: [id number] Date of Birth: 11-May-1966

## 2017-06-07 ENCOUNTER — Ambulatory Visit: Payer: PPO | Admitting: Physical Therapy

## 2017-06-07 ENCOUNTER — Encounter: Payer: Self-pay | Admitting: Physical Therapy

## 2017-06-07 DIAGNOSIS — G8929 Other chronic pain: Secondary | ICD-10-CM

## 2017-06-07 DIAGNOSIS — M5440 Lumbago with sciatica, unspecified side: Secondary | ICD-10-CM

## 2017-06-07 DIAGNOSIS — R209 Unspecified disturbances of skin sensation: Secondary | ICD-10-CM

## 2017-06-07 DIAGNOSIS — M6281 Muscle weakness (generalized): Secondary | ICD-10-CM

## 2017-06-07 DIAGNOSIS — R262 Difficulty in walking, not elsewhere classified: Secondary | ICD-10-CM | POA: Diagnosis not present

## 2017-06-07 NOTE — Therapy (Addendum)
[place], [place], [address] Phone: [phone]   Fax:  [phone]  Physical Therapy Treatment  Patient Details  Name: Lucas Small MRN: [id number] Date of Birth: 10/01/1965 Referring Provider: Roma Schanz, DO    Encounter Date: 06/07/2017  PT End of Session - 06/07/17 0857    Visit Number  3    Number of Visits  12    Date for PT Re-Evaluation  07/10/17    PT Start Time  0845    PT Stop Time  0935    PT Time Calculation (min)  50 min    Activity Tolerance  Patient tolerated treatment well    Behavior During Therapy  [facility] for tasks assessed/performed       Past Medical History:  Diagnosis Date  . Alcoholic ([place])   . CHF (congestive heart failure) ([place])   . Depression   . Diabetes mellitus without complication (HCC)    metformin  . Dyspnea    with exertion .  Marland Kitchen Dysrhythmia    while hospitalized in [place]  . History of kidney stones   . Hypertension   . MRSA (methicillin resistant Staphylococcus aureus)   . Multiple myeloma ([place]) 1990's  . Perforation bowel ([place])   . PNA (pneumonia)   . Renal disorder   . Renal failure   . Renal insufficiency   . Respiratory failure ([place])   . Sickle cell anemia (HCC)   . Sleep apnea    no CPAP machine    Past Surgical History:  Procedure Laterality Date  . ABDOMINAL SURGERY    . CARDIAC SURGERY    . HERNIA REPAIR    . ILEOSTOMY    . PERIPHERALLY INSERTED CENTRAL CATHETER INSERTION      There were no vitals filed for this visit.  Subjective Assessment - 06/07/17 0844    Subjective  7/10 in abs and low back (not as bad).  Donnald Garre been hurting since Tuesday.  Was not able to do much of his HEP.      Currently in Pain?  Yes    Pain Score  7          [facility] Adult PT Treatment/Exercise - 06/07/17 0001      Lumbar Exercises: Stretches   Active Hamstring Stretch  3 reps;30 seconds    Single Knee to Chest Stretch  3 reps;30 seconds    Lower Trunk Rotation  10 seconds    Lower Trunk Rotation Limitations  x 10     Pelvic Tilt  10 seconds    Pelvic Tilt Limitations  x 10 , gentle     Piriformis Stretch  2 reps knee over knee with LTR       Lumbar Exercises: Aerobic   Stationary Bike   Nustep L 3 UE/LE x 5 minutes       Lumbar Exercises: Sidelying   Other Sidelying Lumbar Exercises  upper trunk rotation x 5 each side     Other Sidelying Lumbar Exercises  sidelying L trunk (quadratus lumborum) for about 1 min       Moist Heat Therapy   Number Minutes Moist Heat  15 Minutes      Electrical Stimulation   Electrical Stimulation Location  lumbar     Electrical Stimulation Action  IFC    Electrical Stimulation Parameters  to tol, 20     Electrical Stimulation Goals  Pain      Manual Therapy   Manual therapy  comments  overpressure to L hip for myofascial stretch             PT Education - 06/07/17 0920    Education provided  Yes    Education Details  HEP, stretching, IFC for pain relief, back brace implications    Person(s) Educated  Patient    Methods  Explanation    Comprehension  Verbalized understanding          PT Long Term Goals - 06/07/17 0903      PT LONG TERM GOAL #1   Title  Pt will be I with HEP for core and hip strength     Baseline  needs cues     Status  On-going      PT LONG TERM GOAL #2   Title  Pt will be able to improve FOTO score to 50% limited or better.     Status  Unable to assess      PT LONG TERM GOAL #3   Title  Pt will be able to sit comfortably for 30 min in msot types of chairs.     Status  On-going      PT LONG TERM GOAL #4   Title  Pt will be able to walk 1 mile and report min pain increase in back pain.     Status  On-going            Plan - 06/07/17 0848    Clinical Impression Statement  Patient very sore today.  Had difficulty with simple posterior pelvic tilt.  Cues to go a bit more gently.  Did not take pain meds today, so we did not progress (other  than hip stretching).  Trial of IFC for pain control with heat., walked out with less pain, feeling better.     PT Treatment/Interventions  ADLs/Self Care Home Management;Cryotherapy;Ultrasound;Traction;Moist Heat;Stair training;Balance training;Dry needling;Passive range of motion;Manual techniques;Patient/family education;Therapeutic exercise;Functional mobility training;Neuromuscular re-education;Therapeutic activities;Electrical Stimulation    PT Next Visit Plan  check HEP, manual in sidelying , stretching into flexion , develop HEP for core ,repeat IFC/ heat     PT Home Exercise Plan  quadruped  and post pelvic tilt     Consulted and Agree with Plan of Care  Patient       Patient will benefit from skilled therapeutic intervention in order to improve the following deficits and impairments:  Abnormal gait, Decreased mobility, Difficulty walking, Increased muscle spasms, Obesity, Pain, Postural dysfunction, Impaired flexibility, Increased fascial restricitons, Decreased strength, Decreased activity tolerance, Decreased range of motion, Improper body mechanics, Impaired sensation, Hypomobility, Decreased endurance, Decreased balance, Dizziness  Visit Diagnosis: Difficulty in walking, not elsewhere classified  Muscle weakness (generalized)  Chronic bilateral low back pain with sciatica, sciatica laterality unspecified  Unspecified disturbances of skin sensation     Problem List Patient Active Problem List   Diagnosis Date Noted  . History of suicide attempt   . Echocardiogram shows left ventricular diastolic dysfunction 36/64/4034  . Alcohol abuse with alcohol-induced mood disorder ([place]) 12/28/2016  . Obesity (BMI 30-39.9) 11/13/2016  . MDD (major depressive disorder), recurrent severe, without psychosis ([place]) 10/05/2016  . OSA (obstructive sleep apnea) 04/25/2016  . Back pain 12/15/2015  . GERD (gastroesophageal reflux disease) 12/15/2015  . DM (diabetes mellitus) type II  uncontrolled, periph vascular disorder ([place]) 12/15/2015  . Hyperlipidemia LDL goal <70 12/15/2015  . Diverticulitis 03/05/2013  . Tobacco dependency 03/05/2013  . DOE (dyspnea on exertion) 03/05/2013  . Major depression, recurrent ([place]) 11/24/2012  .  Snoring disorder 08/27/2011  . Substance induced mood disorder ([place]) 08/23/2011    Class: Acute  . Alcohol use disorder, severe, dependence ([place]) 08/23/2011    Class: Acute    PAA,JENNIFER 06/07/2017, 9:42 AM  [facility] [address] [place], [place], [address] Phone: [phone]   Fax:  [phone]  Name: Lucas Small MRN: [id number] Date of Birth: 01/20/1966  Raeford Razor, PT 06/07/17 9:42 AM Phone: [phone] Fax: [phone]

## 2017-06-10 ENCOUNTER — Ambulatory Visit: Payer: PPO | Admitting: Physical Therapy

## 2017-06-11 ENCOUNTER — Ambulatory Visit (HOSPITAL_COMMUNITY): Payer: Self-pay | Admitting: Licensed Clinical Social Worker

## 2017-06-13 ENCOUNTER — Ambulatory Visit: Payer: PPO | Admitting: Physical Therapy

## 2017-06-17 ENCOUNTER — Ambulatory Visit: Payer: PPO | Admitting: Physical Therapy

## 2017-06-17 DIAGNOSIS — M6281 Muscle weakness (generalized): Secondary | ICD-10-CM

## 2017-06-17 DIAGNOSIS — R262 Difficulty in walking, not elsewhere classified: Secondary | ICD-10-CM | POA: Diagnosis not present

## 2017-06-17 DIAGNOSIS — M5441 Lumbago with sciatica, right side: Secondary | ICD-10-CM

## 2017-06-17 DIAGNOSIS — M5440 Lumbago with sciatica, unspecified side: Secondary | ICD-10-CM

## 2017-06-17 DIAGNOSIS — R209 Unspecified disturbances of skin sensation: Secondary | ICD-10-CM

## 2017-06-17 DIAGNOSIS — G8929 Other chronic pain: Secondary | ICD-10-CM

## 2017-06-17 NOTE — Therapy (Signed)
[place], [place], [address] Phone: [phone]   Fax:  [phone]  Physical Therapy Treatment  Patient Details  Name: Lucas Small MRN: [id number] Date of Birth: 01-11-1966 Referring Provider: Roma Schanz, DO    Encounter Date: 06/17/2017  PT End of Session - 06/17/17 1055    Visit Number  4    Number of Visits  12    Date for PT Re-Evaluation  07/10/17    PT Start Time  5102    PT Stop Time  1109    PT Time Calculation (min)  54 min    Activity Tolerance  Patient tolerated treatment well    Behavior During Therapy  [facility] for tasks assessed/performed       Past Medical History:  Diagnosis Date  . Alcoholic ([place])   . CHF (congestive heart failure) ([place])   . Depression   . Diabetes mellitus without complication (HCC)    metformin  . Dyspnea    with exertion .  Marland Kitchen Dysrhythmia    while hospitalized in [place]  . History of kidney stones   . Hypertension   . MRSA (methicillin resistant Staphylococcus aureus)   . Multiple myeloma ([place]) 1990's  . Perforation bowel ([place])   . PNA (pneumonia)   . Renal disorder   . Renal failure   . Renal insufficiency   . Respiratory failure ([place])   . Sickle cell anemia (HCC)   . Sleep apnea    no CPAP machine    Past Surgical History:  Procedure Laterality Date  . ABDOMINAL SURGERY    . CARDIAC SURGERY    . HERNIA REPAIR    . ILEOSTOMY    . PERIPHERALLY INSERTED CENTRAL CATHETER INSERTION      There were no vitals filed for this visit.  Subjective Assessment - 06/17/17 1018    Subjective  Back is hurting.   I fear its getting a bit worse.  Cancelled Friday due to abdominal pain as well.      Currently in Pain?  Yes    Pain Score  8     Pain Location  Back    Pain Orientation  Right;Left;Lower    Pain Descriptors / Indicators  Tightness;Cramping    Pain Type  Chronic pain    Pain Onset  More than a month ago    Pain Frequency  Constant     Aggravating Factors   sitting,standing , acitvity in general     Pain Relieving Factors  meds, lying down, heat , IFC helped a  bit           [facility] Adult PT Treatment/Exercise - 06/17/17 0001      Self-Care   Other Self-Care Comments   core contraction, spinal mobility, ROM, anf technique       Lumbar Exercises: Stretches   Lower Trunk Rotation  10 seconds    Lower Trunk Rotation Limitations  x 10 used ball     Pelvic Tilt  10 seconds    Pelvic Tilt Limitations  x 10 , gentle       Lumbar Exercises: Supine   Clam  10 reps    Heel Slides  10 reps with ball     Bridge  10 reps 2 sets one on ball     Other Supine Lumbar Exercises  supine scapular stabilization green band : narrow grip, horizontal pull x 10  legs resting on large ball for core  Lumbar Exercises: Quadruped   [place]  5 reps    Other Quadruped Lumbar Exercises  childs pose FW       Moist Heat Therapy   Number Minutes Moist Heat  15 Minutes during mat ex     Moist Heat Location  Lumbar Spine      Electrical Stimulation   Electrical Stimulation Location  lumbar     Electrical Stimulation Action  IFC     Electrical Stimulation Parameters  to tol     Electrical Stimulation Goals  Pain             PT Education - 06/17/17 1055    Education provided  Yes    Education Details  self care     Person(s) Educated  Patient    Methods  Explanation;Verbal cues;Demonstration    Comprehension  Verbalized understanding;Returned demonstration;Need further instruction          PT Long Term Goals - 06/17/17 1055      PT LONG TERM GOAL #1   Title  Pt will be I with HEP for core and hip strength     Baseline  needs cues     Status  On-going      PT LONG TERM GOAL #2   Title  Pt will be able to improve FOTO score to 50% limited or better.     Status  On-going      PT LONG TERM GOAL #3   Title  Pt will be able to sit comfortably for 30 min in msot types of chairs.     Status  On-going       PT LONG TERM GOAL #4   Title  Pt will be able to walk 1 mile and report min pain increase in back pain.     Status  On-going            Plan - 06/17/17 1055    Clinical Impression Statement  No notable progress yet.  He needs cues for HEP but can do better than when he began.  Weakness in core, over recruits and needs cues for breathing technique.     PT Treatment/Interventions  ADLs/Self Care Home Management;Cryotherapy;Ultrasound;Traction;Moist Heat;Stair training;Balance training;Dry needling;Passive range of motion;Manual techniques;Patient/family education;Therapeutic exercise;Functional mobility training;Neuromuscular re-education;Therapeutic activities;Electrical Stimulation    PT Next Visit Plan  gentle core, check  PPT, bridging, flexion strethcing , IFC. heat     PT Home Exercise Plan  quadruped  and post pelvic tilt , bridging     Consulted and Agree with Plan of Care  Patient       Patient will benefit from skilled therapeutic intervention in order to improve the following deficits and impairments:  Abnormal gait, Decreased mobility, Difficulty walking, Increased muscle spasms, Obesity, Pain, Postural dysfunction, Impaired flexibility, Increased fascial restricitons, Decreased strength, Decreased activity tolerance, Decreased range of motion, Improper body mechanics, Impaired sensation, Hypomobility, Decreased endurance, Decreased balance, Dizziness  Visit Diagnosis: Difficulty in walking, not elsewhere classified  Muscle weakness (generalized)  Chronic bilateral low back pain with sciatica, sciatica laterality unspecified  Unspecified disturbances of skin sensation     Problem List Patient Active Problem List   Diagnosis Date Noted  . History of suicide attempt   . Echocardiogram shows left ventricular diastolic dysfunction 01/12/2017  . Alcohol abuse with alcohol-induced mood disorder (HCC) 12/28/2016  . Obesity (BMI 30-39.9) 11/13/2016  . MDD (major depressive  disorder), recurrent severe, without psychosis (HCC) 10/05/2016  . OSA (obstructive sleep apnea) 04/25/2016  .   Back pain 12/15/2015  . GERD (gastroesophageal reflux disease) 12/15/2015  . DM (diabetes mellitus) type II uncontrolled, periph vascular disorder ([place]) 12/15/2015  . Hyperlipidemia LDL goal <70 12/15/2015  . Diverticulitis 03/05/2013  . Tobacco dependency 03/05/2013  . DOE (dyspnea on exertion) 03/05/2013  . Major depression, recurrent ([place]) 11/24/2012  . Snoring disorder 08/27/2011  . Substance induced mood disorder ([place]) 08/23/2011    Class: Acute  . Alcohol use disorder, severe, dependence ([place]) 08/23/2011    Class: Acute    PAA,JENNIFER 06/17/2017, 10:57 AM  [place], [place], [address] Phone: [phone]   Fax:  [phone]  Name: Lucas Small MRN: [id number] Date of Birth: 1965-09-05   Raeford Razor, PT 06/17/17 11:01 AM Phone: [phone] Fax: [phone]

## 2017-06-17 NOTE — Patient Instructions (Signed)
Bridging    Slowly raise buttocks from floor, keeping stomach tight. Keep thighs parallel. Draw belly in as you lift.  Repeat ___10_ times per set. Do __1__ sets per session. Do __2__ sessions per day.  http://orth.exer.us/1096   Copyright  VHI. All rights reserved.

## 2017-06-19 ENCOUNTER — Other Ambulatory Visit: Payer: Self-pay | Admitting: Family Medicine

## 2017-06-19 DIAGNOSIS — M5441 Lumbago with sciatica, right side: Principal | ICD-10-CM

## 2017-06-19 DIAGNOSIS — G8929 Other chronic pain: Secondary | ICD-10-CM

## 2017-06-19 NOTE — Telephone Encounter (Signed)
Last filled per database: 05/09/2017 Last written: 05/09/2017 Last ov: 05/09/2017 Next ov: 08/13/2016 Contract: [id number] UDS: None on file

## 2017-06-19 NOTE — Telephone Encounter (Signed)
Ok to refill both x1 Need uds

## 2017-06-20 ENCOUNTER — Ambulatory Visit: Payer: PPO | Admitting: Physical Therapy

## 2017-06-20 ENCOUNTER — Encounter: Payer: Self-pay | Admitting: Physical Therapy

## 2017-06-20 DIAGNOSIS — M5440 Lumbago with sciatica, unspecified side: Secondary | ICD-10-CM

## 2017-06-20 DIAGNOSIS — R209 Unspecified disturbances of skin sensation: Secondary | ICD-10-CM

## 2017-06-20 DIAGNOSIS — M6281 Muscle weakness (generalized): Secondary | ICD-10-CM

## 2017-06-20 DIAGNOSIS — R262 Difficulty in walking, not elsewhere classified: Secondary | ICD-10-CM

## 2017-06-20 DIAGNOSIS — G8929 Other chronic pain: Secondary | ICD-10-CM

## 2017-06-20 NOTE — Therapy (Signed)
[place], [place], [address] Phone: [phone]   Fax:  [phone]  Physical Therapy Treatment  Patient Details  Name: Lucas Small MRN: [id number] Date of Birth: 07/23/1965 Referring Provider: Roma Schanz, DO    Encounter Date: 06/20/2017  PT End of Session - 06/20/17 1059    Visit Number  5    Number of Visits  12    Date for PT Re-Evaluation  07/10/17    PT Start Time  1019    PT Stop Time  1114    PT Time Calculation (min)  55 min    Activity Tolerance  Patient tolerated treatment well    Behavior During Therapy  [facility] for tasks assessed/performed       Past Medical History:  Diagnosis Date  . Alcoholic ([place])   . CHF (congestive heart failure) ([place])   . Depression   . Diabetes mellitus without complication (HCC)    metformin  . Dyspnea    with exertion .  Marland Kitchen Dysrhythmia    while hospitalized in [place]  . History of kidney stones   . Hypertension   . MRSA (methicillin resistant Staphylococcus aureus)   . Multiple myeloma ([place]) 1990's  . Perforation bowel ([place])   . PNA (pneumonia)   . Renal disorder   . Renal failure   . Renal insufficiency   . Respiratory failure ([place])   . Sickle cell anemia (HCC)   . Sleep apnea    no CPAP machine    Past Surgical History:  Procedure Laterality Date  . ABDOMINAL SURGERY    . CARDIAC SURGERY    . HERNIA REPAIR    . ILEOSTOMY    . PERIPHERALLY INSERTED CENTRAL CATHETER INSERTION      There were no vitals filed for this visit.  Subjective Assessment - 06/20/17 1022    Subjective  Right over my kidneys hurt now. I might see my Urologist to see if I have a kidney stone.  My low back is OK hurts a little.     Currently in Pain?  Yes    Pain Score  8     Pain Location  Back    Pain Orientation  Lower;Mid;Left    Pain Descriptors / Indicators  Tightness;Cramping    Pain Onset  More than a month ago    Pain Frequency  Constant    Pain Relieving Factors  IFC, heat            [facility] Adult PT Treatment/Exercise - 06/20/17 0001      Lumbar Exercises: Stretches   Single Knee to Chest Stretch  3 reps;30 seconds    Lower Trunk Rotation  10 seconds    Lower Trunk Rotation Limitations  x 10 used ball     Pelvic Tilt  10 seconds    Pelvic Tilt Limitations  x 10 , gentle       Lumbar Exercises: Seated   Other Seated Lumbar Exercises  seated core with magic circle core contraction , max cues for avoiding lordosis and LESS of an effort.  Overrecruits.        Lumbar Exercises: Supine   Ab Set  10 reps    Clam  10 reps    Bent Knee Raise  10 reps      Lumbar Exercises: Sidelying   Other Sidelying Lumbar Exercises  upper trunk rotationx 5 each side      Moist Heat Therapy   Number  Minutes Moist Heat  15 Minutes    Moist Heat Location  Lumbar Spine      Electrical Stimulation   Electrical Stimulation Location  lumbar     Electrical Stimulation Action  IFC    Electrical Stimulation Parameters  to tol     Electrical Stimulation Goals  Pain             PT Education - 06/20/17 1059    Education provided  Yes    Education Details  stabilization, breathing and over reruitment     Person(s) Educated  Patient    Methods  Explanation;Handout;Verbal cues;Tactile cues;Demonstration    Comprehension  Verbalized understanding;Tactile cues required;Verbal cues required          PT Long Term Goals - 06/17/17 1055      PT LONG TERM GOAL #1   Title  Pt will be I with HEP for core and hip strength     Baseline  needs cues     Status  On-going      PT LONG TERM GOAL #2   Title  Pt will be able to improve FOTO score to 50% limited or better.     Status  On-going      PT LONG TERM GOAL #3   Title  Pt will be able to sit comfortably for 30 min in msot types of chairs.     Status  On-going      PT LONG TERM GOAL #4   Title  Pt will be able to walk 1 mile and report min pain increase in back pain.     Status   On-going            Plan - 06/20/17 1100    Clinical Impression Statement  Pt has not made notable progress as of yet.  He needs max cueing to perform a functional core contraction.  Pain into L lateral trunk today. Would like to finish next week and may refer back to MD.     PT Treatment/Interventions  ADLs/Self Care Home Management;Cryotherapy;Ultrasound;Traction;Moist Heat;Stair training;Balance training;Dry needling;Passive range of motion;Manual techniques;Patient/family education;Therapeutic exercise;Functional mobility training;Neuromuscular re-education;Therapeutic activities;Electrical Stimulation    PT Next Visit Plan  gentle core, check  PPT, bridging, flexion strethcing , IFC. heat     PT Home Exercise Plan  quadruped  and post pelvic tilt , bridging , L stab I     Consulted and Agree with Plan of Care  Patient       Patient will benefit from skilled therapeutic intervention in order to improve the following deficits and impairments:  Abnormal gait, Decreased mobility, Difficulty walking, Increased muscle spasms, Obesity, Pain, Postural dysfunction, Impaired flexibility, Increased fascial restricitons, Decreased strength, Decreased activity tolerance, Decreased range of motion, Improper body mechanics, Impaired sensation, Hypomobility, Decreased endurance, Decreased balance, Dizziness  Visit Diagnosis: Difficulty in walking, not elsewhere classified  Muscle weakness (generalized)  Chronic bilateral low back pain with sciatica, sciatica laterality unspecified  Unspecified disturbances of skin sensation     Problem List Patient Active Problem List   Diagnosis Date Noted  . History of suicide attempt   . Echocardiogram shows left ventricular diastolic dysfunction 91/47/8295  . Alcohol abuse with alcohol-induced mood disorder ([place]) 12/28/2016  . Obesity (BMI 30-39.9) 11/13/2016  . MDD (major depressive disorder), recurrent severe, without psychosis ([place]) 10/05/2016  .  OSA (obstructive sleep apnea) 04/25/2016  . Back pain 12/15/2015  . GERD (gastroesophageal reflux disease) 12/15/2015  . DM (diabetes mellitus) type II uncontrolled,  periph vascular disorder ([place]) 12/15/2015  . Hyperlipidemia LDL goal <70 12/15/2015  . Diverticulitis 03/05/2013  . Tobacco dependency 03/05/2013  . DOE (dyspnea on exertion) 03/05/2013  . Major depression, recurrent ([place]) 11/24/2012  . Snoring disorder 08/27/2011  . Substance induced mood disorder ([place]) 08/23/2011    Class: Acute  . Alcohol use disorder, severe, dependence ([place]) 08/23/2011    Class: Acute    Gadiel John 06/20/2017, 12:54 PM  [facility] [address] [place], [place], [address] Phone: [phone]   Fax:  [phone]  Name: LINKEN MCGLOTHEN MRN: [id number] Date of Birth: 1965/10/15  Raeford Razor, PT 06/20/17 12:54 PM Phone: [phone] Fax: [phone]

## 2017-06-20 NOTE — Patient Instructions (Signed)
L stab I from cabinet

## 2017-06-24 ENCOUNTER — Encounter: Payer: Self-pay | Admitting: Physical Therapy

## 2017-06-24 ENCOUNTER — Ambulatory Visit: Payer: PPO | Admitting: Physical Therapy

## 2017-06-24 DIAGNOSIS — R262 Difficulty in walking, not elsewhere classified: Secondary | ICD-10-CM | POA: Diagnosis not present

## 2017-06-24 DIAGNOSIS — G8929 Other chronic pain: Secondary | ICD-10-CM

## 2017-06-24 DIAGNOSIS — M5440 Lumbago with sciatica, unspecified side: Secondary | ICD-10-CM

## 2017-06-24 DIAGNOSIS — M6281 Muscle weakness (generalized): Secondary | ICD-10-CM

## 2017-06-24 DIAGNOSIS — R209 Unspecified disturbances of skin sensation: Secondary | ICD-10-CM

## 2017-06-24 NOTE — Therapy (Signed)
[place], [place], [address] Phone: [phone]   Fax:  [phone]  Physical Therapy Treatment  Patient Details  Name: Lucas Small MRN: [id number] Date of Birth: 27-Jun-1965 Referring Provider: Roma Schanz, DO    Encounter Date: 06/24/2017  PT End of Session - 06/24/17 1046    Visit Number  6    Number of Visits  12    Date for PT Re-Evaluation  07/10/17    PT Start Time  1029 late, then had to stand in line     PT Stop Time  1120 heat for 15 min     PT Time Calculation (min)  51 min    Activity Tolerance  Patient tolerated treatment well    Behavior During Therapy  [facility] for tasks assessed/performed       Past Medical History:  Diagnosis Date  . Alcoholic ([place])   . CHF (congestive heart failure) ([place])   . Depression   . Diabetes mellitus without complication (HCC)    metformin  . Dyspnea    with exertion .  Marland Kitchen Dysrhythmia    while hospitalized in [place]  . History of kidney stones   . Hypertension   . MRSA (methicillin resistant Staphylococcus aureus)   . Multiple myeloma ([place]) 1990's  . Perforation bowel ([place])   . PNA (pneumonia)   . Renal disorder   . Renal failure   . Renal insufficiency   . Respiratory failure ([place])   . Sickle cell anemia (HCC)   . Sleep apnea    no CPAP machine    Past Surgical History:  Procedure Laterality Date  . ABDOMINAL SURGERY    . CARDIAC SURGERY    . HERNIA REPAIR    . ILEOSTOMY    . PERIPHERALLY INSERTED CENTRAL CATHETER INSERTION      There were no vitals filed for this visit.  Subjective Assessment - 06/24/17 1031    Subjective  Its not too bad today.  Arrived late today had a decent weekend.  5/10.     Currently in Pain?  Yes    Pain Score  5     Pain Location  Back    Pain Orientation  Left;Lower    Pain Descriptors / Indicators  Tightness    Pain Type  Chronic pain    Pain Onset  More than a month ago    Pain Frequency   Constant         [facility] Adult PT Treatment/Exercise - 06/24/17 0001      Lumbar Exercises: Stretches   Active Hamstring Stretch  2 reps;30 seconds    Lower Trunk Rotation  10 seconds    Piriformis Stretch  2 reps;30 seconds knee to opp shoulder       Lumbar Exercises: Aerobic   Stationary Bike  Nustep L3 , UE and LE for 5 min       Lumbar Exercises: Standing   Other Standing Lumbar Exercises  sink stretch for myofascial stretching to T-L spine x 3 , 30 sec cues to bend knees       Lumbar Exercises: Supine   Ab Set  10 reps    Clam  10 reps    Bent Knee Raise  10 reps    Other Supine Lumbar Exercises  SLR x 10 each leg       Lumbar Exercises: Sidelying   Other Sidelying Lumbar Exercises  upper trunk rotationx 5 each side  Moist Heat Therapy   Number Minutes Moist Heat  15 Minutes    Moist Heat Location  Lumbar Spine                  PT Long Term Goals - 06/24/17 1104      PT LONG TERM GOAL #1   Title  Pt will be I with HEP for core and hip strength     Status  Partially Met      PT LONG TERM GOAL #2   Title  Pt will be able to improve FOTO score to 50% limited or better.     Status  Achieved      PT LONG TERM GOAL #3   Title  Pt will be able to sit comfortably for 30 min in msot types of chairs.     Baseline  depends on the chair and the day    Status  Partially Met      PT LONG TERM GOAL #4   Title  Pt will be able to walk 1 mile and report min pain increase in back pain.     Baseline  has not done this     Status  On-going            Plan - 06/24/17 1101    Clinical Impression Statement  Patient plans to try and see his MD this week as he needs more meds and will likely finish PT this week.  He is having a good day but is hesitant to say he is "better:.  Has another appt 1/31 but may cancel it.  FOTO score improved to that which was predicted, 50%.     PT Treatment/Interventions  ADLs/Self Care Home  Management;Cryotherapy;Ultrasound;Traction;Moist Heat;Stair training;Balance training;Dry needling;Passive range of motion;Manual techniques;Patient/family education;Therapeutic exercise;Functional mobility training;Neuromuscular re-education;Therapeutic activities;Electrical Stimulation    PT Next Visit Plan  gentle core, check  PPT, bridging, flexion strethcing , IFC. heat , DC? foto done today in case.     PT Home Exercise Plan  quadruped  and post pelvic tilt , bridging , L stab I     Consulted and Agree with Plan of Care  Patient       Patient will benefit from skilled therapeutic intervention in order to improve the following deficits and impairments:  Abnormal gait, Decreased mobility, Difficulty walking, Increased muscle spasms, Obesity, Pain, Postural dysfunction, Impaired flexibility, Increased fascial restricitons, Decreased strength, Decreased activity tolerance, Decreased range of motion, Improper body mechanics, Impaired sensation, Hypomobility, Decreased endurance, Decreased balance, Dizziness  Visit Diagnosis: Difficulty in walking, not elsewhere classified  Muscle weakness (generalized)  Chronic bilateral low back pain with sciatica, sciatica laterality unspecified  Unspecified disturbances of skin sensation     Problem List Patient Active Problem List   Diagnosis Date Noted  . History of suicide attempt   . Echocardiogram shows left ventricular diastolic dysfunction 18/98/4210  . Alcohol abuse with alcohol-induced mood disorder ([place]) 12/28/2016  . Obesity (BMI 30-39.9) 11/13/2016  . MDD (major depressive disorder), recurrent severe, without psychosis ([place]) 10/05/2016  . OSA (obstructive sleep apnea) 04/25/2016  . Back pain 12/15/2015  . GERD (gastroesophageal reflux disease) 12/15/2015  . DM (diabetes mellitus) type II uncontrolled, periph vascular disorder ([place]) 12/15/2015  . Hyperlipidemia LDL goal <70 12/15/2015  . Diverticulitis 03/05/2013  . Tobacco  dependency 03/05/2013  . DOE (dyspnea on exertion) 03/05/2013  . Major depression, recurrent ([place]) 11/24/2012  . Snoring disorder 08/27/2011  . Substance induced mood disorder ([place]) 08/23/2011  Class: Acute  . Alcohol use disorder, severe, dependence ([place]) 08/23/2011    Class: Acute    Shiara Mcgough 06/24/2017, 11:21 AM  [facility] [address] [place], [place], [address] Phone: [phone]   Fax:  [phone]  Name: Lucas Small MRN: [id number] Date of Birth: 1965-08-14   Raeford Razor, PT 06/24/17 11:22 AM Phone: [phone] Fax: [phone]

## 2017-06-27 ENCOUNTER — Ambulatory Visit (INDEPENDENT_AMBULATORY_CARE_PROVIDER_SITE_OTHER): Payer: PPO | Admitting: Family Medicine

## 2017-06-27 ENCOUNTER — Encounter: Payer: Self-pay | Admitting: Family Medicine

## 2017-06-27 ENCOUNTER — Ambulatory Visit: Payer: PPO | Admitting: Physical Therapy

## 2017-06-27 VITALS — BP 132/90 | HR 121 | Temp 97.8°F | Resp 16 | Ht 69.0 in | Wt 206.8 lb

## 2017-06-27 DIAGNOSIS — R198 Other specified symptoms and signs involving the digestive system and abdomen: Secondary | ICD-10-CM

## 2017-06-27 DIAGNOSIS — R000 Tachycardia, unspecified: Secondary | ICD-10-CM | POA: Diagnosis not present

## 2017-06-27 DIAGNOSIS — R262 Difficulty in walking, not elsewhere classified: Secondary | ICD-10-CM

## 2017-06-27 DIAGNOSIS — M5440 Lumbago with sciatica, unspecified side: Secondary | ICD-10-CM

## 2017-06-27 DIAGNOSIS — R110 Nausea: Secondary | ICD-10-CM | POA: Diagnosis not present

## 2017-06-27 DIAGNOSIS — G8929 Other chronic pain: Secondary | ICD-10-CM

## 2017-06-27 DIAGNOSIS — R079 Chest pain, unspecified: Secondary | ICD-10-CM

## 2017-06-27 DIAGNOSIS — M6281 Muscle weakness (generalized): Secondary | ICD-10-CM

## 2017-06-27 DIAGNOSIS — M5441 Lumbago with sciatica, right side: Secondary | ICD-10-CM

## 2017-06-27 DIAGNOSIS — Z79899 Other long term (current) drug therapy: Secondary | ICD-10-CM | POA: Diagnosis not present

## 2017-06-27 DIAGNOSIS — I10 Essential (primary) hypertension: Secondary | ICD-10-CM | POA: Diagnosis not present

## 2017-06-27 MED ORDER — CYCLOBENZAPRINE HCL 10 MG PO TABS: 10.0000 mg | ORAL_TABLET | Freq: Three times a day (TID) | ORAL | 0 refills | Status: DC | PRN

## 2017-06-27 MED ORDER — METOPROLOL SUCCINATE ER 25 MG PO TB24: 25.0000 mg | ORAL_TABLET | Freq: Every day | ORAL | 3 refills | Status: DC

## 2017-06-27 MED ORDER — TRAMADOL HCL 50 MG PO TABS: 50.0000 mg | ORAL_TABLET | Freq: Three times a day (TID) | ORAL | 0 refills | Status: DC | PRN

## 2017-06-27 MED ORDER — ONDANSETRON HCL 8 MG PO TABS: 8.0000 mg | ORAL_TABLET | Freq: Three times a day (TID) | ORAL | 0 refills | Status: DC | PRN

## 2017-06-27 NOTE — Patient Instructions (Signed)

## 2017-06-27 NOTE — Therapy (Addendum)
[place], [place], [address] Phone: [phone]   Fax:  [phone]  Physical Therapy Treatment and Discharge Patient Details  Name: TAISHAUN LEVELS MRN: [id number] Date of Birth: 07-Nov-1965 Referring Provider: Roma Schanz, DO    Encounter Date: 06/27/2017  PT End of Session - 06/27/17 1025    Visit Number  7    Number of Visits  12    Date for PT Re-Evaluation  07/10/17    PT Start Time  4076    PT Stop Time  1057 request to end early    PT Time Calculation (min)  40 min       Past Medical History:  Diagnosis Date  . Alcoholic ([place])   . CHF (congestive heart failure) ([place])   . Depression   . Diabetes mellitus without complication (HCC)    metformin  . Dyspnea    with exertion .  Marland Kitchen Dysrhythmia    while hospitalized in [place]  . History of kidney stones   . Hypertension   . MRSA (methicillin resistant Staphylococcus aureus)   . Multiple myeloma ([place]) 1990's  . Perforation bowel ([place])   . PNA (pneumonia)   . Renal disorder   . Renal failure   . Renal insufficiency   . Respiratory failure ([place])   . Sickle cell anemia (HCC)   . Sleep apnea    no CPAP machine    Past Surgical History:  Procedure Laterality Date  . ABDOMINAL SURGERY    . CARDIAC SURGERY    . HERNIA REPAIR    . ILEOSTOMY    . PERIPHERALLY INSERTED CENTRAL CATHETER INSERTION      There were no vitals filed for this visit.  Subjective Assessment - 06/27/17 1022    Currently in Pain?  Yes    Pain Score  6     Pain Location  Back    Pain Orientation  Lower;Left    Pain Descriptors / Indicators  Aching;Tightness;Cramping    Aggravating Factors   activity in general    Pain Relieving Factors  IFC, HEAT     Multiple Pain Sites  Yes    Pain Score  7    Pain Location  Abdomen    Pain Orientation  Anterior    Pain Descriptors / Indicators  Spasm    Aggravating Factors   not sure     Pain Relieving Factors  lie  down, pain meds                       [facility] Adult PT Treatment/Exercise - 06/27/17 0001      Lumbar Exercises: Stretches   Single Knee to Chest Stretch  3 reps;30 seconds    Lower Trunk Rotation  10 seconds    Pelvic Tilt  10 seconds    Pelvic Tilt Limitations  x 10 , gentle     Piriformis Stretch  2 reps;30 seconds knee to opp shoulder       Lumbar Exercises: Aerobic   Stationary Bike  Nustep L3 , UE and LE for 4 minutes and 30 seconds       Lumbar Exercises: Supine   Clam  10 reps    Bridge  10 reps    Other Supine Lumbar Exercises  SLR x 10 each leg       Lumbar Exercises: Sidelying   Clam  5 reps;10 reps more reps on right  Moist Heat Therapy   Number Minutes Moist Heat  15 Minutes    Moist Heat Location  Lumbar Spine                  PT Long Term Goals - 06/24/17 1104      PT LONG TERM GOAL #1   Title  Pt will be I with HEP for core and hip strength     Status  Partially Met      PT LONG TERM GOAL #2   Title  Pt will be able to improve FOTO score to 50% limited or better.     Status  Achieved      PT LONG TERM GOAL #3   Title  Pt will be able to sit comfortably for 30 min in msot types of chairs.     Baseline  depends on the chair and the day    Status  Partially Met      PT LONG TERM GOAL #4   Title  Pt will be able to walk 1 mile and report min pain increase in back pain.     Baseline  has not done this     Status  On-going            Plan - 06/27/17 1028    Clinical Impression Statement  Pt reports he almost did not come today due to increased pain in abdominals. Very gentle therex with decreased reps. Pt asked to end treatment early and asked for Moist heat pack. He wiill see MD today for further evaluation. No more appointments scheduled, Probable discharge.     PT Next Visit Plan  probable discharge, sees MD today.     PT Home Exercise Plan  quadruped  and post pelvic tilt , bridging , L stab I     Consulted and  Agree with Plan of Care  Patient       Patient will benefit from skilled therapeutic intervention in order to improve the following deficits and impairments:  Abnormal gait, Decreased mobility, Difficulty walking, Increased muscle spasms, Obesity, Pain, Postural dysfunction, Impaired flexibility, Increased fascial restricitons, Decreased strength, Decreased activity tolerance, Decreased range of motion, Improper body mechanics, Impaired sensation, Hypomobility, Decreased endurance, Decreased balance, Dizziness  Visit Diagnosis: Difficulty in walking, not elsewhere classified  Muscle weakness (generalized)  Chronic bilateral low back pain with sciatica, sciatica laterality unspecified     Problem List Patient Active Problem List   Diagnosis Date Noted  . History of suicide attempt   . Echocardiogram shows left ventricular diastolic dysfunction 24/40/1027  . Alcohol abuse with alcohol-induced mood disorder ([place]) 12/28/2016  . Obesity (BMI 30-39.9) 11/13/2016  . MDD (major depressive disorder), recurrent severe, without psychosis ([place]) 10/05/2016  . OSA (obstructive sleep apnea) 04/25/2016  . Back pain 12/15/2015  . GERD (gastroesophageal reflux disease) 12/15/2015  . DM (diabetes mellitus) type II uncontrolled, periph vascular disorder ([place]) 12/15/2015  . Hyperlipidemia LDL goal <70 12/15/2015  . Diverticulitis 03/05/2013  . Tobacco dependency 03/05/2013  . DOE (dyspnea on exertion) 03/05/2013  . Major depression, recurrent ([place]) 11/24/2012  . Snoring disorder 08/27/2011  . Substance induced mood disorder ([place]) 08/23/2011    Class: Acute  . Alcohol use disorder, severe, dependence ([place]) 08/23/2011    Class: Acute    Dorene Ar , PTA 06/27/2017, 11:00 AM  [facility] [address] [place], [place], [address] Phone: [phone]   Fax:  [phone]  Name: KACHE MCCLURG  MRN: [id number] Date of Birth:  [birth year]  PHYSICAL THERAPY DISCHARGE SUMMARY  Visits from Start of Care: 7  Current functional level related to goals / functional outcomes: See above.  Pain fluctuates.  Abdominal pain.  Very deconditioned.    Remaining deficits: Weakness in abdominals, hips, pain, decr flexibility.    Education / Equipment: HEP, posture, pain, body mechanics   Plan: Patient agrees to discharge.  Patient goals were partially met. Patient is being discharged due to the patient's request.  ?????    Raeford Razor, PT 07/31/17 3:20 PM Phone: [phone] Fax: [phone]

## 2017-06-27 NOTE — Progress Notes (Signed)
Patient ID: [name], male   DOB: [birth year], 52 y.o.   MRN: [id number]    Subjective:  I acted as a Education administrator for Dr. Carollee Herter.  [name], [place]   Patient ID: Lucas Small, male    DOB: [birth year], 52 y.o.   MRN: [id number]  Chief Complaint  Patient presents with  . Back Pain    HPI  Patient is in today for back pain and to refill medications.  He has been in physical therapy twice a week.  He does not feel like it is helping much.    He is having more trouble with his abd and hernias.  He is struggling with nausea and alt consitpation and diarrhea and is requesting a GI referral .    Patient Care Team: Carollee Herter, Alferd Apa, DO as PCP - General (Family Medicine)   Past Medical History:  Diagnosis Date  . Alcoholic ([place])   . CHF (congestive heart failure) ([place])   . Depression   . Diabetes mellitus without complication (HCC)    metformin  . Dyspnea    with exertion .  Marland Kitchen Dysrhythmia    while hospitalized in [place]  . History of kidney stones   . Hypertension   . MRSA (methicillin resistant Staphylococcus aureus)   . Multiple myeloma ([place]) 1990's  . Perforation bowel ([place])   . PNA (pneumonia)   . Renal disorder   . Renal failure   . Renal insufficiency   . Respiratory failure ([place])   . Sickle cell anemia (HCC)   . Sleep apnea    no CPAP machine    Past Surgical History:  Procedure Laterality Date  . ABDOMINAL SURGERY    . CARDIAC SURGERY    . HERNIA REPAIR    . ILEOSTOMY    . PERIPHERALLY INSERTED CENTRAL CATHETER INSERTION      Family History  Problem Relation Age of Onset  . Heart disease Mother   . Heart failure Mother     Social History   Socioeconomic History  . Marital status: Divorced    Spouse name: Not on file  . Number of children: Not on file  . Years of education: Not on file  . Highest education level: Not on file  Social Needs  . Financial resource strain: Not on file  . Food insecurity - worry: Not on file  . Food insecurity  - inability: Not on file  . Transportation needs - medical: Not on file  . Transportation needs - non-medical: Not on file  Occupational History    Comment: disabled  Tobacco Use  . Smoking status: Current Every Day Smoker    Packs/day: 1.50    Years: 28.00    Pack years: 42.00    Types: Cigarettes  . Smokeless tobacco: Never Used  . Tobacco comment: Now using patches and down to 1-2 cigarettes a day  Substance and Sexual Activity  . Alcohol use: Yes    Alcohol/week: 4.2 oz    Types: 7 Cans of beer per week    Comment: occasionally  . Drug use: No    Comment: THC cocaine  . Sexual activity: Not Currently    Comment: Not asked  Other Topics Concern  . Not on file  Social History Narrative   Exercise---  Walking some --  Not enough    Outpatient Medications Prior to Visit  Medication Sig Dispense Refill  . DULoxetine HCl 40 MG CPEP Take 40 mg by mouth daily. 90 capsule 0  .  ezetimibe (ZETIA) 10 MG tablet Take 1 tablet (10 mg total) by mouth daily. 90 tablet 1  . fenofibrate 160 MG tablet Take 1 tablet (160 mg total) by mouth daily. 90 tablet 1  . gabapentin (NEURONTIN) 300 MG capsule Take 1 capsule (300 mg total) by mouth 3 (three) times daily. 90 capsule 1  . lamoTRIgine (LAMICTAL) 100 MG tablet Take 1 tablet (100 mg total) by mouth daily. 90 tablet 0  . metFORMIN (GLUCOPHAGE) 1000 MG tablet Take 1,000 mg by mouth 2 (two) times daily with a meal.    . metFORMIN (GLUCOPHAGE) 500 MG tablet TAKE 1 TABLET(500 MG) BY MOUTH TWICE DAILY WITH A MEAL 180 tablet 0  . nicotine (NICODERM CQ - DOSED IN MG/24 HOURS) 21 mg/24hr patch Place 1 patch (21 mg total) onto the skin daily. 28 patch 0  . ONETOUCH DELICA LANCETS FINE MISC USE AS DIRECTED TO CHECK BLOOD SUGAR TWICE DAILY 100 each 0  . ONETOUCH VERIO test strip USE TWICE DAILY AS DIRECTED 100 each 1  . ONETOUCH VERIO test strip USE TWICE DAILY 100 each 0  . pantoprazole (PROTONIX) 40 MG tablet Take 40 mg by mouth daily.     .  rosuvastatin (CRESTOR) 40 MG tablet Take 1 tablet (40 mg total) by mouth daily. 90 tablet 1  . sitaGLIPtin (JANUVIA) 100 MG tablet Take 1 tablet (100 mg total) by mouth daily. 90 tablet 1  . traZODone (DESYREL) 150 MG tablet Take 1 tablet (150 mg total) by mouth at bedtime as needed for sleep. 90 tablet 0  . cyclobenzaprine (FLEXERIL) 10 MG tablet Take 1 tablet (10 mg total) by mouth 3 (three) times daily as needed for muscle spasms. 30 tablet 0  . traMADol (ULTRAM) 50 MG tablet Take 1 tablet (50 mg total) by mouth every 8 (eight) hours as needed. 30 tablet 0   No facility-administered medications prior to visit.     Allergies  Allergen Reactions  . Bee Venom Anaphylaxis    Review of Systems  Constitutional: Negative for fever and malaise/fatigue.  HENT: Negative for congestion.   Eyes: Negative for blurred vision.  Respiratory: Negative for cough and shortness of breath.   Cardiovascular: Negative for chest pain, palpitations and leg swelling.  Gastrointestinal: Positive for abdominal pain, constipation, diarrhea and nausea. Negative for vomiting.  Musculoskeletal: Positive for back pain.  Skin: Negative for rash.  Neurological: Negative for loss of consciousness and headaches.       Objective:    Physical Exam  Constitutional: He is oriented to person, place, and time. Vital signs are normal. He appears well-developed and well-nourished. He is sleeping.  HENT:  Head: Normocephalic and atraumatic.  Mouth/Throat: Oropharynx is clear and moist.  Eyes: EOM are normal. Pupils are equal, round, and reactive to light.  Neck: Normal range of motion. Neck supple. No thyromegaly present.  Cardiovascular: Normal rate and regular rhythm.  No murmur heard. Pulmonary/Chest: Effort normal and breath sounds normal. No respiratory distress. He has no wheezes. He has no rales. He exhibits no tenderness.  Musculoskeletal: He exhibits no edema or tenderness.  Neurological: He is alert and  oriented to person, place, and time.  Skin: Skin is warm and dry.  Psychiatric: He has a normal mood and affect. His behavior is normal. Judgment and thought content normal.  Nursing note and vitals reviewed.   BP 132/90   Pulse (!) 121   Temp 97.8 F (36.6 C) (Oral)   Resp 16   Ht 5'  9" (1.753 m)   Wt 206 lb 12.8 oz (93.8 kg)   SpO2 96%   BMI 30.54 kg/m  Wt Readings from Last 3 Encounters:  06/27/17 206 lb 12.8 oz (93.8 kg)  05/09/17 214 lb (97.1 kg)  01/11/17 212 lb (96.2 kg)   BP Readings from Last 3 Encounters:  06/27/17 132/90  05/09/17 111/73  01/11/17 110/90     Immunization History  Administered Date(s) Administered  . PPD Test 08/28/2011  . Pneumococcal Polysaccharide-23 02/13/2016    Health Maintenance  Topic Date Due  . TETANUS/TDAP  11/16/1985  . COLONOSCOPY  11/16/2016  . FOOT EXAM  02/12/2017  . URINE MICROALBUMIN  02/12/2017  . OPHTHALMOLOGY EXAM  02/19/2017  . INFLUENZA VACCINE  04/27/2018 (Originally 12/26/2016)  . HEMOGLOBIN A1C  11/07/2017  . PNEUMOCOCCAL POLYSACCHARIDE VACCINE (2) 02/12/2021  . HIV Screening  Completed    Lab Results  Component Value Date   WBC 8.6 12/28/2016   HGB 17.6 (H) 12/28/2016   HCT 48.8 12/28/2016   PLT 278 12/28/2016   GLUCOSE 156 (H) 05/09/2017   CHOL 211 (H) 05/09/2017   TRIG 313.0 (H) 05/09/2017   HDL 44.00 05/09/2017   LDLDIRECT 156.0 05/09/2017   LDLCALC 101 (H) 02/12/2017   ALT 39 05/09/2017   AST 26 05/09/2017   NA 136 05/09/2017   K 4.9 05/09/2017   CL 100 05/09/2017   CREATININE 1.32 05/09/2017   BUN 26 (H) 05/09/2017   CO2 30 05/09/2017   TSH 1.59 02/13/2016   INR 1.11 10/14/2010   HGBA1C 7.0 (H) 05/09/2017   MICROALBUR 2.5 (H) 02/13/2016    Lab Results  Component Value Date   TSH 1.59 02/13/2016   Lab Results  Component Value Date   WBC 8.6 12/28/2016   HGB 17.6 (H) 12/28/2016   HCT 48.8 12/28/2016   MCV 90.0 12/28/2016   PLT 278 12/28/2016   Lab Results  Component Value Date    NA 136 05/09/2017   K 4.9 05/09/2017   CO2 30 05/09/2017   GLUCOSE 156 (H) 05/09/2017   BUN 26 (H) 05/09/2017   CREATININE 1.32 05/09/2017   BILITOT 0.4 05/09/2017   ALKPHOS 71 05/09/2017   AST 26 05/09/2017   ALT 39 05/09/2017   PROT 7.9 05/09/2017   ALBUMIN 4.5 05/09/2017   CALCIUM 11.1 (H) 05/09/2017   ANIONGAP 14 12/28/2016   GFR 60.90 05/09/2017   Lab Results  Component Value Date   CHOL 211 (H) 05/09/2017   Lab Results  Component Value Date   HDL 44.00 05/09/2017   Lab Results  Component Value Date   LDLCALC 101 (H) 02/12/2017   Lab Results  Component Value Date   TRIG 313.0 (H) 05/09/2017   Lab Results  Component Value Date   CHOLHDL 5 05/09/2017   Lab Results  Component Value Date   HGBA1C 7.0 (H) 05/09/2017      ekg -- sinus tachy    Assessment & Plan:   Problem List Items Addressed This Visit      Unprioritized   Back pain   Relevant Medications   cyclobenzaprine (FLEXERIL) 10 MG tablet   traMADol (ULTRAM) 50 MG tablet   Other Relevant Orders   Ambulatory referral to Pain Clinic   Pain Mgmt, Tramadol w/medMATCH, U    Other Visit Diagnoses    High risk medication use    -  Primary   Relevant Orders   Pain Mgmt, Profile 8 w/Conf, U   Pain Mgmt, Tramadol w/medMATCH,  U   Nausea       Relevant Medications   ondansetron (ZOFRAN) 8 MG tablet   Other Relevant Orders   Ambulatory referral to Gastroenterology   Alternating constipation and diarrhea       Relevant Orders   Ambulatory referral to Gastroenterology   Essential hypertension       Relevant Medications   metoprolol succinate (TOPROL-XL) 25 MG 24 hr tablet   Other Relevant Orders   EKG 12-Lead (Completed)   Tachycardia       Relevant Medications   metoprolol succinate (TOPROL-XL) 25 MG 24 hr tablet   Other Relevant Orders   EKG 12-Lead (Completed)   Chest pain, unspecified type       Relevant Orders   EKG 12-Lead (Completed)      I am having Lucas Small start on  metoprolol succinate and ondansetron. I am also having him maintain his nicotine, pantoprazole, metFORMIN, rosuvastatin, fenofibrate, metFORMIN, ONETOUCH DELICA LANCETS FINE, lamoTRIgine, DULoxetine HCl, gabapentin, traZODone, ONETOUCH VERIO, ezetimibe, sitaGLIPtin, ONETOUCH VERIO, cyclobenzaprine, and traMADol.  Meds ordered this encounter  Medications  . cyclobenzaprine (FLEXERIL) 10 MG tablet    Sig: Take 1 tablet (10 mg total) by mouth 3 (three) times daily as needed for muscle spasms.    Dispense:  30 tablet    Refill:  0  . traMADol (ULTRAM) 50 MG tablet    Sig: Take 1 tablet (50 mg total) by mouth every 8 (eight) hours as needed.    Dispense:  30 tablet    Refill:  0  . metoprolol succinate (TOPROL-XL) 25 MG 24 hr tablet    Sig: Take 1 tablet (25 mg total) by mouth daily.    Dispense:  90 tablet    Refill:  3  . ondansetron (ZOFRAN) 8 MG tablet    Sig: Take 1 tablet (8 mg total) by mouth every 8 (eight) hours as needed for nausea or vomiting.    Dispense:  30 tablet    Refill:  0    CMA served as scribe during this visit. History, Physical and Plan performed by medical provider. Documentation and orders reviewed and attested to.  Ann Held, DO

## 2017-06-30 LAB — PAIN MGMT, PROFILE 8 W/CONF, U
6 Acetylmorphine: NEGATIVE ng/mL (ref <10)
Alcohol Metabolites: POSITIVE ng/mL — AB (ref <500)
Amphetamines: NEGATIVE ng/mL (ref <500)
BUPRENORPHINE, URINE: NEGATIVE ng/mL (ref <5)
Benzodiazepines: NEGATIVE ng/mL (ref <100)
Cocaine Metabolite: NEGATIVE ng/mL (ref <150)
Creatinine: 163.3 mg/dL
MARIJUANA METABOLITE: NEGATIVE ng/mL (ref <20)
MDA: NEGATIVE ng/mL (ref <200)
MDMA: NEGATIVE ng/mL (ref <200)
MDMA: NEGATIVE ng/mL (ref <500)
OXYCODONE: NEGATIVE ng/mL (ref <100)
Opiates: NEGATIVE ng/mL (ref <100)
Oxidant: NEGATIVE ug/mL (ref <200)
pH: 6.4 (ref 4.5–9.0)

## 2017-06-30 LAB — PAIN MGMT, TRAMADOL W/MEDMATCH, U
Desmethyltramadol: NEGATIVE ng/mL (ref <100)
TRAMADOL: NEGATIVE ng/mL (ref <100)

## 2017-07-17 ENCOUNTER — Ambulatory Visit (HOSPITAL_COMMUNITY): Payer: PPO | Admitting: Psychiatry

## 2017-07-17 ENCOUNTER — Encounter (HOSPITAL_COMMUNITY): Payer: Self-pay | Admitting: Psychiatry

## 2017-07-17 DIAGNOSIS — M549 Dorsalgia, unspecified: Secondary | ICD-10-CM

## 2017-07-17 DIAGNOSIS — F419 Anxiety disorder, unspecified: Secondary | ICD-10-CM

## 2017-07-17 DIAGNOSIS — F17210 Nicotine dependence, cigarettes, uncomplicated: Secondary | ICD-10-CM

## 2017-07-17 DIAGNOSIS — F1099 Alcohol use, unspecified with unspecified alcohol-induced disorder: Secondary | ICD-10-CM | POA: Diagnosis not present

## 2017-07-17 DIAGNOSIS — R251 Tremor, unspecified: Secondary | ICD-10-CM | POA: Diagnosis not present

## 2017-07-17 DIAGNOSIS — R450 Nervousness: Secondary | ICD-10-CM

## 2017-07-17 DIAGNOSIS — F3131 Bipolar disorder, current episode depressed, mild: Secondary | ICD-10-CM

## 2017-07-17 DIAGNOSIS — M2550 Pain in unspecified joint: Secondary | ICD-10-CM | POA: Diagnosis not present

## 2017-07-17 MED ORDER — LAMOTRIGINE 150 MG PO TABS: 150.0000 mg | ORAL_TABLET | Freq: Every day | ORAL | 0 refills | Status: DC

## 2017-07-17 MED ORDER — DULOXETINE HCL 40 MG PO CPEP: 40.0000 mg | ORAL_CAPSULE | Freq: Every day | ORAL | 0 refills | Status: DC

## 2017-07-17 MED ORDER — TRAZODONE HCL 150 MG PO TABS: 150.0000 mg | ORAL_TABLET | Freq: Every evening | ORAL | 0 refills | Status: DC | PRN
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
## 2017-07-19 ENCOUNTER — Ambulatory Visit: Payer: Self-pay

## 2017-07-22 ENCOUNTER — Ambulatory Visit (INDEPENDENT_AMBULATORY_CARE_PROVIDER_SITE_OTHER): Payer: PPO | Admitting: Family Medicine

## 2017-07-22 ENCOUNTER — Encounter: Payer: Self-pay | Admitting: Family Medicine

## 2017-07-22 DIAGNOSIS — Z23 Encounter for immunization: Secondary | ICD-10-CM | POA: Diagnosis not present

## 2017-07-22 DIAGNOSIS — M5441 Lumbago with sciatica, right side: Secondary | ICD-10-CM | POA: Diagnosis not present

## 2017-07-22 DIAGNOSIS — G8929 Other chronic pain: Secondary | ICD-10-CM | POA: Diagnosis not present

## 2017-07-22 MED ORDER — TETANUS-DIPHTH-ACELL PERTUSSIS 5-2.5-18.5 LF-MCG/0.5 IM SUSP
0.5000 mL | Freq: Once | INTRAMUSCULAR | Status: AC
  Administered 2017-07-22: 0.5 mL via INTRAMUSCULAR

## 2017-07-22 MED ORDER — TRAMADOL HCL 50 MG PO TABS: 50.0000 mg | ORAL_TABLET | Freq: Three times a day (TID) | ORAL | 0 refills | Status: DC | PRN

## 2017-07-22 NOTE — Progress Notes (Signed)
Patient ID: Lucas Small, male   DOB: 09-26-1965, 52 y.o.   MRN: [id number]    Subjective:  I acted as a Education administrator for Dr. Carollee Herter.  Lucas Small, Lucas Small   Patient ID: Lucas Small, male    DOB: [birth year], 52 y.o.   MRN: [id number]  Chief Complaint  Patient presents with  . Back Pain  . Abdominal Pain    HPI  Patient is in today for abdominal pain and back pain.  He would like to get a refill on tramadol.  He states today is good day and he is not hurting that bad.   Patient Care Team: Carollee Herter, Alferd Apa, DO as PCP - General (Family Medicine)   Past Medical History:  Diagnosis Date  . Alcoholic ([place])   . CHF (congestive heart failure) ([place])   . Depression   . Diabetes mellitus without complication (HCC)    metformin  . Dyspnea    with exertion .  Marland Kitchen Dysrhythmia    while hospitalized in [place]  . History of kidney stones   . Hypertension   . MRSA (methicillin resistant Staphylococcus aureus)   . Multiple myeloma ([place]) 1990's  . Perforation bowel ([place])   . PNA (pneumonia)   . Renal disorder   . Renal failure   . Renal insufficiency   . Respiratory failure ([place])   . Sickle cell anemia (HCC)   . Sleep apnea    no CPAP machine    Past Surgical History:  Procedure Laterality Date  . ABDOMINAL SURGERY    . CARDIAC SURGERY    . HERNIA REPAIR    . ILEOSTOMY    . PERIPHERALLY INSERTED CENTRAL CATHETER INSERTION      Family History  Problem Relation Age of Onset  . Heart disease Mother   . Heart failure Mother     Social History   Socioeconomic History  . Marital status: Divorced    Spouse name: Not on file  . Number of children: Not on file  . Years of education: Not on file  . Highest education level: Not on file  Social Needs  . Financial resource strain: Not on file  . Food insecurity - worry: Not on file  . Food insecurity - inability: Not on file  . Transportation needs - medical: Not on file  . Transportation needs - non-medical: Not on file    Occupational History    Comment: disabled  Tobacco Use  . Smoking status: Current Every Day Smoker    Packs/day: 1.50    Years: 28.00    Pack years: 42.00    Types: Cigarettes  . Smokeless tobacco: Never Used  . Tobacco comment: Now using patches and down to 1-2 cigarettes a day  Substance and Sexual Activity  . Alcohol use: Yes    Alcohol/week: 4.2 oz    Types: 7 Cans of beer per week    Comment: occasionally  . Drug use: No    Comment: THC cocaine  . Sexual activity: Not Currently    Comment: Not asked  Other Topics Concern  . Not on file  Social History Narrative   Exercise---  Walking some --  Not enough    Outpatient Medications Prior to Visit  Medication Sig Dispense Refill  . cyclobenzaprine (FLEXERIL) 10 MG tablet Take 1 tablet (10 mg total) by mouth 3 (three) times daily as needed for muscle spasms. 30 tablet 0  . DULoxetine HCl 40 MG CPEP Take 40 mg by mouth daily.  90 capsule 0  . ezetimibe (ZETIA) 10 MG tablet Take 1 tablet (10 mg total) by mouth daily. 90 tablet 1  . fenofibrate 160 MG tablet Take 1 tablet (160 mg total) by mouth daily. 90 tablet 1  . gabapentin (NEURONTIN) 300 MG capsule Take 1 capsule (300 mg total) by mouth 3 (three) times daily. 90 capsule 1  . lamoTRIgine (LAMICTAL) 150 MG tablet Take 1 tablet (150 mg total) by mouth daily. 90 tablet 0  . metFORMIN (GLUCOPHAGE) 1000 MG tablet Take 1,000 mg by mouth 2 (two) times daily with a meal.    . metFORMIN (GLUCOPHAGE) 500 MG tablet TAKE 1 TABLET(500 MG) BY MOUTH TWICE DAILY WITH A MEAL 180 tablet 0  . metoprolol succinate (TOPROL-XL) 25 MG 24 hr tablet Take 1 tablet (25 mg total) by mouth daily. 90 tablet 3  . ondansetron (ZOFRAN) 8 MG tablet Take 1 tablet (8 mg total) by mouth every 8 (eight) hours as needed for nausea or vomiting. 30 tablet 0  . ONETOUCH DELICA LANCETS FINE MISC USE AS DIRECTED TO CHECK BLOOD SUGAR TWICE DAILY 100 each 0  . ONETOUCH VERIO test strip USE TWICE DAILY AS DIRECTED 100  each 1  . ONETOUCH VERIO test strip USE TWICE DAILY 100 each 0  . pantoprazole (PROTONIX) 40 MG tablet Take 40 mg by mouth daily.     . rosuvastatin (CRESTOR) 40 MG tablet Take 1 tablet (40 mg total) by mouth daily. 90 tablet 1  . sitaGLIPtin (JANUVIA) 100 MG tablet Take 1 tablet (100 mg total) by mouth daily. 90 tablet 1  . traZODone (DESYREL) 150 MG tablet Take 1 tablet (150 mg total) by mouth at bedtime as needed for sleep. 90 tablet 0  . traMADol (ULTRAM) 50 MG tablet Take 1 tablet (50 mg total) by mouth every 8 (eight) hours as needed. 30 tablet 0   No facility-administered medications prior to visit.     Allergies  Allergen Reactions  . Bee Venom Anaphylaxis    Review of Systems  Constitutional: Negative for fever and malaise/fatigue.  HENT: Negative for congestion.   Eyes: Negative for blurred vision.  Respiratory: Negative for cough and shortness of breath.   Cardiovascular: Negative for chest pain, palpitations and leg swelling.  Gastrointestinal: Positive for abdominal pain. Negative for vomiting.  Musculoskeletal: Positive for back pain.  Skin: Negative for rash.  Neurological: Negative for loss of consciousness and headaches.       Objective:    Physical Exam  Constitutional: He is oriented to person, place, and time. Vital signs are normal. He appears well-developed and well-nourished. He is sleeping.  HENT:  Head: Normocephalic and atraumatic.  Mouth/Throat: Oropharynx is clear and moist.  Eyes: EOM are normal. Pupils are equal, round, and reactive to light.  Neck: Normal range of motion. Neck supple. No thyromegaly present.  Cardiovascular: Normal rate and regular rhythm.  No murmur heard. Pulmonary/Chest: Effort normal and breath sounds normal. No respiratory distress. He has no wheezes. He has no rales. He exhibits no tenderness.  Musculoskeletal: He exhibits no edema or tenderness.  Neurological: He is alert and oriented to person, place, and time.  Skin:  Skin is warm and dry.  Psychiatric: He has a normal mood and affect. His behavior is normal. Judgment and thought content normal.  Nursing note and vitals reviewed.  Sensory exam of the foot is normal, tested with the monofilament. Good pulses, no lesions or ulcers, good peripheral pulses.   BP 98/68 (BP Location:  Left Arm, Cuff Size: Normal)   Pulse 83   Temp 98 F (36.7 C) (Oral)   Resp 16   Ht '5\' 9"'$  (1.753 m)   Wt 211 lb 6.4 oz (95.9 kg)   SpO2 95%   BMI 31.22 kg/m  Wt Readings from Last 3 Encounters:  07/22/17 211 lb 6.4 oz (95.9 kg)  06/27/17 206 lb 12.8 oz (93.8 kg)  05/09/17 214 lb (97.1 kg)   BP Readings from Last 3 Encounters:  07/22/17 98/68  06/27/17 132/90  05/09/17 111/73     Immunization History  Administered Date(s) Administered  . PPD Test 08/28/2011  . Pneumococcal Polysaccharide-23 02/13/2016  . Tdap 07/22/2017    Health Maintenance  Topic Date Due  . Samul Dada  11/16/1985  . COLONOSCOPY  11/16/2016  . URINE MICROALBUMIN  02/12/2017  . OPHTHALMOLOGY EXAM  07/11/2017  . INFLUENZA VACCINE  04/27/2018 (Originally 12/26/2016)  . HEMOGLOBIN A1C  11/07/2017  . FOOT EXAM  07/22/2018  . PNEUMOCOCCAL POLYSACCHARIDE VACCINE (2) 02/12/2021  . HIV Screening  Completed    Lab Results  Component Value Date   WBC 8.6 12/28/2016   HGB 17.6 (H) 12/28/2016   HCT 48.8 12/28/2016   PLT 278 12/28/2016   GLUCOSE 156 (H) 05/09/2017   CHOL 211 (H) 05/09/2017   TRIG 313.0 (H) 05/09/2017   HDL 44.00 05/09/2017   LDLDIRECT 156.0 05/09/2017   LDLCALC 101 (H) 02/12/2017   ALT 39 05/09/2017   AST 26 05/09/2017   NA 136 05/09/2017   K 4.9 05/09/2017   CL 100 05/09/2017   CREATININE 1.32 05/09/2017   BUN 26 (H) 05/09/2017   CO2 30 05/09/2017   TSH 1.59 02/13/2016   INR 1.11 10/14/2010   HGBA1C 7.0 (H) 05/09/2017   MICROALBUR 2.5 (H) 02/13/2016    Lab Results  Component Value Date   TSH 1.59 02/13/2016   Lab Results  Component Value Date   WBC 8.6  12/28/2016   HGB 17.6 (H) 12/28/2016   HCT 48.8 12/28/2016   MCV 90.0 12/28/2016   PLT 278 12/28/2016   Lab Results  Component Value Date   NA 136 05/09/2017   K 4.9 05/09/2017   CO2 30 05/09/2017   GLUCOSE 156 (H) 05/09/2017   BUN 26 (H) 05/09/2017   CREATININE 1.32 05/09/2017   BILITOT 0.4 05/09/2017   ALKPHOS 71 05/09/2017   AST 26 05/09/2017   ALT 39 05/09/2017   PROT 7.9 05/09/2017   ALBUMIN 4.5 05/09/2017   CALCIUM 11.1 (H) 05/09/2017   ANIONGAP 14 12/28/2016   GFR 60.90 05/09/2017   Lab Results  Component Value Date   CHOL 211 (H) 05/09/2017   Lab Results  Component Value Date   HDL 44.00 05/09/2017   Lab Results  Component Value Date   LDLCALC 101 (H) 02/12/2017   Lab Results  Component Value Date   TRIG 313.0 (H) 05/09/2017   Lab Results  Component Value Date   CHOLHDL 5 05/09/2017   Lab Results  Component Value Date   HGBA1C 7.0 (H) 05/09/2017         Assessment & Plan:   Problem List Items Addressed This Visit      Unprioritized   Back pain    Stable Doing well today Refill ultram      Relevant Medications   traMADol (ULTRAM) 50 MG tablet    Other Visit Diagnoses    Serum calcium elevated    -  Primary   Relevant Orders  PTH, Intact and Calcium   Need for tetanus, diphtheria, and acellular pertussis (Tdap) vaccine       Relevant Medications   Tdap (BOOSTRIX) injection 0.5 mL (Completed)      I am having [name] maintain his pantoprazole, metFORMIN, rosuvastatin, fenofibrate, metFORMIN, ONETOUCH DELICA LANCETS FINE, gabapentin, ONETOUCH VERIO, ezetimibe, sitaGLIPtin, ONETOUCH VERIO, cyclobenzaprine, metoprolol succinate, ondansetron, traZODone, lamoTRIgine, DULoxetine HCl, and traMADol. We administered Tdap.  Meds ordered this encounter  Medications  . traMADol (ULTRAM) 50 MG tablet    Sig: Take 1 tablet (50 mg total) by mouth every 8 (eight) hours as needed.    Dispense:  30 tablet    Refill:  0  . Tdap  (BOOSTRIX) injection 0.5 mL    CMA served as scribe during this visit. History, Physical and Plan performed by medical provider. Documentation and orders reviewed and attested to.  Ann Held, DO

## 2017-07-22 NOTE — Patient Instructions (Signed)

## 2017-07-22 NOTE — Assessment & Plan Note (Signed)
Stable Doing well today Refill ultram

## 2017-07-23 LAB — PTH, INTACT AND CALCIUM
CALCIUM: 9.8 mg/dL (ref 8.6–10.3)
PTH: 25 pg/mL (ref 14–64)

## 2017-07-25 ENCOUNTER — Encounter: Payer: Self-pay | Admitting: Gastroenterology

## 2017-07-26 ENCOUNTER — Encounter: Payer: Self-pay | Admitting: Physical Medicine & Rehabilitation

## 2017-07-31 ENCOUNTER — Other Ambulatory Visit: Payer: Self-pay | Admitting: Family Medicine

## 2017-07-31 DIAGNOSIS — E1151 Type 2 diabetes mellitus with diabetic peripheral angiopathy without gangrene: Secondary | ICD-10-CM

## 2017-07-31 DIAGNOSIS — E1165 Type 2 diabetes mellitus with hyperglycemia: Principal | ICD-10-CM

## 2017-07-31 DIAGNOSIS — IMO0002 Reserved for concepts with insufficient information to code with codable children: Secondary | ICD-10-CM

## 2017-08-13 ENCOUNTER — Other Ambulatory Visit (INDEPENDENT_AMBULATORY_CARE_PROVIDER_SITE_OTHER): Payer: PPO

## 2017-08-13 DIAGNOSIS — E1151 Type 2 diabetes mellitus with diabetic peripheral angiopathy without gangrene: Secondary | ICD-10-CM | POA: Diagnosis not present

## 2017-08-13 DIAGNOSIS — E1165 Type 2 diabetes mellitus with hyperglycemia: Secondary | ICD-10-CM

## 2017-08-13 DIAGNOSIS — IMO0002 Reserved for concepts with insufficient information to code with codable children: Secondary | ICD-10-CM

## 2017-08-13 LAB — LIPID PANEL
CHOLESTEROL: 127 mg/dL (ref 0–200)
HDL: 37.9 mg/dL — AB (ref >39.00)
LDL CALC: 67 mg/dL (ref 0–99)
NonHDL: 88.78
TRIGLYCERIDES: 111 mg/dL (ref 0.0–149.0)
Total CHOL/HDL Ratio: 3
VLDL: 22.2 mg/dL (ref 0.0–40.0)

## 2017-08-13 LAB — HEMOGLOBIN A1C: HEMOGLOBIN A1C: 6.8 % — AB (ref 4.6–6.5)

## 2017-08-15 ENCOUNTER — Encounter: Payer: Self-pay | Admitting: Physical Medicine & Rehabilitation

## 2017-08-15 ENCOUNTER — Ambulatory Visit (HOSPITAL_BASED_OUTPATIENT_CLINIC_OR_DEPARTMENT_OTHER): Payer: PPO | Admitting: Physical Medicine & Rehabilitation

## 2017-08-15 ENCOUNTER — Encounter: Payer: PPO | Attending: Physical Medicine & Rehabilitation

## 2017-08-15 VITALS — BP 139/93 | HR 114 | Resp 14

## 2017-08-15 DIAGNOSIS — M545 Low back pain: Secondary | ICD-10-CM | POA: Insufficient documentation

## 2017-08-15 DIAGNOSIS — G5711 Meralgia paresthetica, right lower limb: Secondary | ICD-10-CM | POA: Diagnosis not present

## 2017-08-15 DIAGNOSIS — G8929 Other chronic pain: Secondary | ICD-10-CM | POA: Diagnosis not present

## 2017-08-15 DIAGNOSIS — M488X6 Other specified spondylopathies, lumbar region: Secondary | ICD-10-CM | POA: Insufficient documentation

## 2017-08-15 DIAGNOSIS — M5459 Other low back pain: Secondary | ICD-10-CM

## 2017-08-15 NOTE — Patient Instructions (Signed)
RECOMMEND [employer] AQUATIC EXERCISE CLASS

## 2017-08-15 NOTE — Progress Notes (Signed)
Subjective:    Patient ID: Lucas Small, male    DOB: 11/27/1965, 52 y.o.   MRN: [id number]  HPI  52 yo male with hx  of  ETOH, Bipolar d/o,multiple psych admissions, diverticulitis,SBO with post op infection abd hernia with placement of mesh in 2016.  Pt has extensive loss of rectus abd muscle  Also had nerve injury after abd surgery in Right lateral thigh as well as Right foot Left sided Low back pain  Does ok with swimming  Worsens with walking exacerbates, laying on back or side  Other issues include hx of ureteral stone Review of Systems     Objective:   Physical Exam  Constitutional: He is oriented to person, place, and time. He appears well-developed and well-nourished.  HENT:  Head: Normocephalic and atraumatic.  Eyes: Pupils are equal, round, and reactive to light. Conjunctivae and EOM are normal.  Neck: Normal range of motion.  Cardiovascular: Normal rate, regular rhythm and normal heart sounds.  No murmur heard. Pulmonary/Chest: Effort normal and breath sounds normal. No respiratory distress. He has no wheezes.  Abdominal: Soft. Bowel sounds are normal. He exhibits no distension. There is no tenderness.  Midline scarring extensive but well healed  Neurological: He is alert and oriented to person, place, and time. He displays tremor. A sensory deficit is present. He exhibits normal muscle tone. Coordination and gait normal.  Reflex Scores:      Patellar reflexes are 2+ on the right side and 2+ on the left side.      Achilles reflexes are 1+ on the right side and 2+ on the left side. Reduced sensation right lateral thigh but not right anterior thigh otherwise sensation normal in the upper and lower limbs Right hand right foot tremor noted  Psychiatric: He has a normal mood and affect.  Nursing note and vitals reviewed.  There is mild tenderness palpation the lumbar paraspinals on the left side.  There is pain with left lateral bending greater than right leg  lateral bending.  There is no pain with forward flexion of the lumbar spine, reduced range of motion with extension but no pain. Negative straight leg raising Motor strength is 5/5 bilateral deltoid bicep tricep grip hip flexion extensor ankle dorsiflexor        Assessment & Plan:  1.  Chronic left-sided low back pain without any radicular symptoms. His x-rays have been reviewed and do not show any signs of spondylolisthesis, disc space narrowing or significant lumbar spondylosis.  As discussed with the patient he still may have facet mediated pain without obvious x-ray abnormalities.    Since he is already trialed physical therapy and he is not a good candidate for stronger opiates, I would recommend lumbar medial branch blocks L3-L4 as well as L5 dorsal ramus injection the left side.  If he has greater than 50% release on 2 occasions on a temporary basis would proceed to radiofrequency neurotomy. He is not on any anticoagulation  Given lack of clear-cut radicular symptoms on the left.  Would hold off on MRI The patient does have reduced Achilles reflex on the right as well as right lateral thigh numbness.  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 Of note is that the patient does get relief with gabapentin.  We also discussed that exercise should be the mainstay of his treatment.  Even though he may not be able to do some of the traditional  core strengthening exercises I do think aquatic exercises should allow him to work on his core in a gentle manner.  [facility] [employer] and doing a group exercise class in the water.

## 2017-08-21 ENCOUNTER — Telehealth: Payer: Self-pay | Admitting: *Deleted

## 2017-08-21 NOTE — Telephone Encounter (Signed)
Copied from [place] [id number]. Topic: General - Other >> Aug 20, 2017  1:09 PM Carolyn Stare wrote:  Pt would like a call back he said he is suppose to be on  2000  metFORMIN (GLUCOPHAGE) 1000 MG tablet there is a 1000 and 500 showing on pt file   [phone]

## 2017-08-23 MED ORDER — METFORMIN HCL 1000 MG PO TABS: 1000.0000 mg | ORAL_TABLET | Freq: Two times a day (BID) | ORAL | 0 refills | Status: DC

## 2017-08-23 NOTE — Telephone Encounter (Signed)
Rx sent to pharmacy  The rx states take 1 tablet bid

## 2017-08-30 ENCOUNTER — Other Ambulatory Visit: Payer: Self-pay | Admitting: Family Medicine

## 2017-08-30 DIAGNOSIS — R1013 Epigastric pain: Secondary | ICD-10-CM

## 2017-09-04 ENCOUNTER — Other Ambulatory Visit (INDEPENDENT_AMBULATORY_CARE_PROVIDER_SITE_OTHER): Payer: PPO

## 2017-09-04 ENCOUNTER — Encounter: Payer: Self-pay | Admitting: Gastroenterology

## 2017-09-04 ENCOUNTER — Telehealth: Payer: Self-pay | Admitting: Gastroenterology

## 2017-09-04 ENCOUNTER — Ambulatory Visit: Payer: PPO | Admitting: Gastroenterology

## 2017-09-04 VITALS — BP 106/70 | HR 88 | Ht 69.0 in | Wt 202.8 lb

## 2017-09-04 DIAGNOSIS — K219 Gastro-esophageal reflux disease without esophagitis: Secondary | ICD-10-CM

## 2017-09-04 LAB — PROTIME-INR
INR: 1 ratio (ref 0.8–1.0)
PROTHROMBIN TIME: 10.8 s (ref 9.6–13.1)

## 2017-09-04 LAB — COMPREHENSIVE METABOLIC PANEL
ALT: 35 U/L (ref 0–53)
AST: 25 U/L (ref 0–37)
Albumin: 4.4 g/dL (ref 3.5–5.2)
Alkaline Phosphatase: 63 U/L (ref 39–117)
BILIRUBIN TOTAL: 0.5 mg/dL (ref 0.2–1.2)
BUN: 14 mg/dL (ref 6–23)
CALCIUM: 9.6 mg/dL (ref 8.4–10.5)
CO2: 30 mEq/L (ref 19–32)
CREATININE: 1.13 mg/dL (ref 0.40–1.50)
Chloride: 104 mEq/L (ref 96–112)
GFR: 72.77 mL/min (ref >60.00)
GLUCOSE: 96 mg/dL (ref 70–99)
Potassium: 4.5 mEq/L (ref 3.5–5.1)
Sodium: 138 mEq/L (ref 135–145)
Total Protein: 7.4 g/dL (ref 6.0–8.3)

## 2017-09-04 LAB — CBC WITH DIFFERENTIAL/PLATELET
BASOS ABS: 0.1 10*3/uL (ref 0.0–0.1)
Basophils Relative: 0.9 % (ref 0.0–3.0)
EOS ABS: 0.5 10*3/uL (ref 0.0–0.7)
Eosinophils Relative: 5.3 % — ABNORMAL HIGH (ref 0.0–5.0)
HCT: 46 % (ref 39.0–52.0)
Hemoglobin: 15.8 g/dL (ref 13.0–17.0)
LYMPHS ABS: 1.8 10*3/uL (ref 0.7–4.0)
Lymphocytes Relative: 20.8 % (ref 12.0–46.0)
MCHC: 34.3 g/dL (ref 30.0–36.0)
MCV: 91.6 fl (ref 78.0–100.0)
MONO ABS: 0.6 10*3/uL (ref 0.1–1.0)
Monocytes Relative: 6.5 % (ref 3.0–12.0)
NEUTROS ABS: 5.8 10*3/uL (ref 1.4–7.7)
NEUTROS PCT: 66.5 % (ref 43.0–77.0)
PLATELETS: 321 10*3/uL (ref 150.0–400.0)
RBC: 5.02 Mil/uL (ref 4.22–5.81)
RDW: 15.3 % (ref 11.5–15.5)
WBC: 8.8 10*3/uL (ref 4.0–10.5)

## 2017-09-04 MED ORDER — PANTOPRAZOLE SODIUM 40 MG PO TBEC: 40.0000 mg | DELAYED_RELEASE_TABLET | Freq: Every day | ORAL | 11 refills | Status: DC

## 2017-09-04 NOTE — Addendum Note (Signed)
Addended by: Grace Bushy A on: 09/04/2017 11:18 AM   Modules accepted: Orders

## 2017-09-04 NOTE — Patient Instructions (Addendum)
You will be set up for a CT scan of abdomen and pelvis with IV and oral contrast (maybe in 2 months per patient) for abnormal pancreas (?mass) noted on 2017 CT scan. Protonix prescription (40mg  pill, one pill once daily, disp 30 pills, 11 refills). You will have labs checked today in the basement lab.  Please head down after you check out with the front desk  (cbc, cmet, inr). We will get records sent from your previous 2013/2014 multiple abdominal surgeries, any colonoscopies, upper endoscopies.  Normal BMI (Body Mass Index- based on height and weight) is between 19 and 25. Your BMI today is Body mass index is 29.95 kg/m. Marland Kitchen Please consider follow up  regarding your BMI with your Primary Care Provider.

## 2017-09-04 NOTE — Progress Notes (Signed)
HPI: This is a 52 year old man who was referred to me by Carollee Herter, Alferd Apa, *  to evaluate abdominal discomfort, previous abdominal surgeries.    Chief complaint is pyrosis, intermittent loose stools, bloating, gas, history of many abdominal surgeries   In 2013 he had acute abominal issues that resulted in surgery  In 2013 he had diveticulitis. Underwent an abdominal surgery ([place]) then devoloped septic shock, renal failure, lung issues. Sounds like a dehiscence.  Underwent other surgeries [facility]); he thinks he had intestinal surgery. Says he was in a coma for 7 weeks.  Had a temporary tracheostomy. Says he had a colostomy after he came out of the coma.  In 2014 he had an ileostomy at a third hospital (in [place]). He had takedown of the two ostomies (in [place], [place]). 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  He sometimes gets pyrosis.  Protonix is on his med list but he says he doesn't take it. When he was taking protonix he thinks this did help his pyrosis.    He has stomach cramps.  He is on flexeril and asked about a stronger medicine for spasms.  Has gas and intermittently loose stool.  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  His last alcoholic beverage was about 2 months ago.  I see encounters in epic system of previous suicidal ideation issues   Old Data Reviewed: CT scan 07/2015: New area of low density within the pancreatic body and tail associated with peripancreatic stranding. The stranding also affects the adrenal gland and left renal pelvis. All of these findings may represent acute pancreatitis within area of necrosis in the body and tail as described above. There are several small adjacent lymph nodes. The focal low-density area measuring 1.9 cm may represent malignancy. Short-term follow-up imaging is recommended to ensure resolution of these findings. Pancreatic enzymes, LFTs 07/2015 were normal.   Review of  systems: Pertinent positive and negative review of systems were noted in the above HPI section. All other review negative.   Past Medical History:  Diagnosis Date  . Alcoholic ([place])   . CHF (congestive heart failure) ([place])   . Depression   . Diabetes mellitus without complication (HCC)    metformin  . Dyspnea    with exertion .  Marland Kitchen Dysrhythmia    while hospitalized in [place]  . History of kidney stones   . Hypertension   . MRSA (methicillin resistant Staphylococcus aureus)   . Multiple myeloma ([place]) 1990's  . Perforation bowel ([place])   . PNA (pneumonia)   . Renal disorder   . Renal failure   . Renal insufficiency   . Respiratory failure ([place])   . Sickle cell anemia (HCC)   . Sleep apnea    no CPAP machine    Past Surgical History:  Procedure Laterality Date  . ABDOMINAL SURGERY    . APPENDECTOMY    . CARDIAC SURGERY    . HERNIA REPAIR    . ILEOSTOMY    . PERIPHERALLY INSERTED CENTRAL CATHETER INSERTION      Current Outpatient Medications  Medication Sig Dispense Refill  . cyclobenzaprine (FLEXERIL) 10 MG tablet Take 1 tablet (10 mg total) by mouth 3 (three) times daily as needed for muscle spasms. 30 tablet 0  . DULoxetine HCl 40 MG CPEP Take 40 mg by mouth daily. 90 capsule 0  . ezetimibe (ZETIA) 10 MG tablet Take 1 tablet (10 mg total) by mouth daily. [place]  tablet 1  . fenofibrate 160 MG tablet Take 1 tablet (160 mg total) by mouth daily. 90 tablet 1  . gabapentin (NEURONTIN) 300 MG capsule Take 1 capsule (300 mg total) by mouth 3 (three) times daily. 90 capsule 1  . lamoTRIgine (LAMICTAL) 150 MG tablet Take 1 tablet (150 mg total) by mouth daily. 90 tablet 0  . metFORMIN (GLUCOPHAGE) 1000 MG tablet Take 1 tablet (1,000 mg total) by mouth 2 (two) times daily with a meal. 180 tablet 0  . metoprolol succinate (TOPROL-XL) 25 MG 24 hr tablet Take 1 tablet (25 mg total) by mouth daily. 90 tablet 3  . ondansetron (ZOFRAN) 8 MG tablet Take 1 tablet (8 mg total) by mouth  every 8 (eight) hours as needed for nausea or vomiting. 30 tablet 0  . ONETOUCH DELICA LANCETS FINE MISC USE AS DIRECTED TO CHECK BLOOD SUGAR TWICE DAILY 100 each 0  . ONETOUCH VERIO test strip USE TWICE DAILY AS DIRECTED 100 each 1  . pantoprazole (PROTONIX) 40 MG tablet TAKE 1 TABLET BY MOUTH TWICE DAILY 180 tablet 0  . rosuvastatin (CRESTOR) 40 MG tablet Take 1 tablet (40 mg total) by mouth daily. 90 tablet 1  . sitaGLIPtin (JANUVIA) 100 MG tablet Take 1 tablet (100 mg total) by mouth daily. 90 tablet 1  . traMADol (ULTRAM) 50 MG tablet Take 1 tablet (50 mg total) by mouth every 8 (eight) hours as needed. 30 tablet 0  . traZODone (DESYREL) 150 MG tablet Take 1 tablet (150 mg total) by mouth at bedtime as needed for sleep. 90 tablet 0   No current facility-administered medications for this visit.     Allergies as of 09/04/2017 - Review Complete 09/04/2017  Allergen Reaction Noted  . Bee venom Anaphylaxis 09/26/2012    Family History  Problem Relation Age of Onset  . Heart disease Mother   . Heart failure Mother     Social History   Socioeconomic History  . Marital status: Divorced    Spouse name: Not on file  . Number of children: Not on file  . Years of education: Not on file  . Highest education level: Not on file  Occupational History    Comment: disabled  Social Needs  . Financial resource strain: Not on file  . Food insecurity:    Worry: Not on file    Inability: Not on file  . Transportation needs:    Medical: Not on file    Non-medical: Not on file  Tobacco Use  . Smoking status: Current Every Day Smoker    Packs/day: 1.50    Years: 28.00    Pack years: 42.00    Types: Cigarettes  . Smokeless tobacco: Never Used  . Tobacco comment: Now using patches and down to 1-2 cigarettes a day  Substance and Sexual Activity  . Alcohol use: Yes    Alcohol/week: 4.2 oz    Types: 7 Cans of beer per week    Comment: occasionally  . Drug use: No    Comment: THC cocaine   . Sexual activity: Not Currently    Comment: Not asked  Lifestyle  . Physical activity:    Days per week: Not on file    Minutes per session: Not on file  . Stress: Not on file  Relationships  . Social connections:    Talks on phone: Not on file    Gets together: Not on file    Attends religious service: Not on file  Active member of club or organization: Not on file    Attends meetings of clubs or organizations: Not on file    Relationship status: Not on file  . Intimate partner violence:    Fear of current or ex partner: Not on file    Emotionally abused: Not on file    Physically abused: Not on file    Forced sexual activity: Not on file  Other Topics Concern  . Not on file  Social History Narrative   Exercise---  Walking some --  Not enough     Physical Exam: Ht '5\' 9"'$  (1.753 m)   Wt 202 lb 12.8 oz (92 kg)   BMI 29.95 kg/m  Constitutional: generally well-appearing Psychiatric: alert and oriented x3 Eyes: extraocular movements intact Mouth: oral pharynx moist, no lesions;  Neck: supple no lymphadenopathy;  evidence of previous tracheostomy Cardiovascular: heart regular rate and rhythm Lungs: clear to auscultation bilaterally Abdomen: soft, mildly tender in the epigastrium, wide midline scar with sites of previous retention suture scars lax abdominal musculature or 0 abdominal musculature no obvious ascites, no peritoneal signs, normal bowel sounds Extremities: no lower extremity edema bilaterally Skin: no lesions on visible extremities   Assessment and plan: 52 y.o. male with multiple previous abdominal surgeries, previous alcoholism  First he has had 16 surgeries that he can recall.  Many of them abdominal surgeries.  On exam he has clearly had significant abdominal surgeries before.  He has a wide well-healed midline scar with evidence of retention sutures and laparoscopy scars as well.  He tells me he is missing several feet of intestine and that may be true.   He has had a colostomy, he has had an ileostomy.  He tells me he has had previous bouts of pancreatitis.  I have extremely limited records at the time of this visit and we are going to reach out to get operative reports, colonoscopy reports, upper endoscopy reports from all of the various hospitals which he can remember that he was admitted at around 2013, 2014.  He did have a CT scan here in 2017.  There was concern about an area of his pancreas that was not normal.  Possibly that was previous pancreatitis or ongoing pancreatitis.  The radiologist noted several lymph nodes in the area.  I do not think that has ever been followed up on.  I do note that around that same time his pancreatic enzymes were all normal.  It seems unlikely that he had pancreatic cancer, neoplasm back then because he is still alive now.  I recommended that he have repeat CT scan abdomen and pelvis and he declined for now.  He will think about it and talk with his insurance company and may be in 2 or 3 months he will be willing.  Biggest issues, symptoms are spasms in his back and he asked for antispastic, muscle relaxing medicines which I am not willing to give him.  He also has pyrosis.  Protonix is on his medicine list but he says he does not have it anymore and some going to give him a prescription for 1 pill once daily.  He also asked about bloating and intermittent loose stools.  That could be from multiple possible etiologies given his vast history and I told he would need some testing for that.  Possibly a colonoscopy.  Possibly an upper endoscopy.  Certainly imaging testing.  He is not willing to undergo any of those right now.  He might be willing  in 2 or 3 months.  For now we will get a basic set of labs including a CBC and complete metabolic profile and coags.   Please see the "Patient Instructions" section for addition details about the plan.   Owens Loffler, MD [facility] 09/04/2017, 10:02 AM  Cc:  Carollee Herter, Alferd Apa, *

## 2017-09-04 NOTE — Addendum Note (Signed)
Addended by: Grace Bushy A on: 09/04/2017 10:56 AM   Modules accepted: Orders

## 2017-09-05 ENCOUNTER — Encounter: Payer: Self-pay | Admitting: Family Medicine

## 2017-09-05 ENCOUNTER — Encounter: Payer: Self-pay | Admitting: Physical Medicine & Rehabilitation

## 2017-09-05 ENCOUNTER — Ambulatory Visit (INDEPENDENT_AMBULATORY_CARE_PROVIDER_SITE_OTHER): Payer: PPO | Admitting: Family Medicine

## 2017-09-05 ENCOUNTER — Ambulatory Visit (HOSPITAL_BASED_OUTPATIENT_CLINIC_OR_DEPARTMENT_OTHER): Payer: PPO | Admitting: Physical Medicine & Rehabilitation

## 2017-09-05 ENCOUNTER — Encounter: Payer: PPO | Attending: Physical Medicine & Rehabilitation

## 2017-09-05 VITALS — BP 122/78 | HR 75 | Resp 16 | Ht 68.5 in | Wt 202.8 lb

## 2017-09-05 VITALS — BP 126/73 | HR 91 | Ht 68.5 in | Wt 203.0 lb

## 2017-09-05 DIAGNOSIS — M545 Low back pain: Secondary | ICD-10-CM

## 2017-09-05 DIAGNOSIS — G8929 Other chronic pain: Secondary | ICD-10-CM | POA: Diagnosis not present

## 2017-09-05 DIAGNOSIS — F332 Major depressive disorder, recurrent severe without psychotic features: Secondary | ICD-10-CM | POA: Diagnosis not present

## 2017-09-05 DIAGNOSIS — M488X6 Other specified spondylopathies, lumbar region: Secondary | ICD-10-CM | POA: Diagnosis not present

## 2017-09-05 DIAGNOSIS — M5459 Other low back pain: Secondary | ICD-10-CM

## 2017-09-05 DIAGNOSIS — F17210 Nicotine dependence, cigarettes, uncomplicated: Secondary | ICD-10-CM

## 2017-09-05 LAB — ETHANOL: Ethanol: NEGATIVE %

## 2017-09-05 MED ORDER — VARENICLINE TARTRATE 0.5 MG X 11 & 1 MG X 42 PO MISC: ORAL | 0 refills | Status: DC

## 2017-09-05 NOTE — Progress Notes (Signed)
  PROCEDURE RECORD [facility]   Name: Lucas Small DOB:1965-10-04 MRN: [id number]  Date:09/05/2017  Physician: Alysia Penna, MD    Nurse/CMA: Bright CMA  Allergies:  Allergies  Allergen Reactions  . Bee Venom Anaphylaxis    Consent Signed: Yes.    Is patient diabetic? Yes.    CBG today? 105  Pregnant: No. LMP: No LMP for male patient. (age 51-55)  Anticoagulants: no Anti-inflammatory: no Antibiotics: no  Procedure: Left L3-5 MBB  Position: Prone   Start Time: 921am  End Time: 927am  Fluoro Time: 34s   RN/CMA Bright CMA Bright CMA    Time 858am 932am    BP 126/73 133/91    Pulse 91 91    Respirations 16 16    O2 Sat 95 95    S/S 6 6    Pain Level 7/10 2/10     D/C home with X-wife, patient A & O X 3, D/C instructions reviewed, and sits independently.

## 2017-09-05 NOTE — Assessment & Plan Note (Signed)
con't with counseling  And meds

## 2017-09-05 NOTE — Patient Instructions (Signed)

## 2017-09-05 NOTE — Assessment & Plan Note (Signed)
Per pain management.  

## 2017-09-05 NOTE — Progress Notes (Signed)
  Left Lumbar L3, L4  medial branch blocks and L 5 dorsal ramus injection under fluoroscopic guidance   Indication: Left Lumbar pain which is not relieved by medication management or other conservative care and interfering with self-care and mobility.  Informed consent was obtained after describing risks and benefits of the procedure with the patient, this includes bleeding, bruising, infection, paralysis and medication side effects.  The patient wishes to proceed and has given written consent.  The patient was placed in a prone position.  The lumbar area was marked and prepped with Betadine.  One mL of 1% lidocaine was injected into each of 3 areas into the skin and subcutaneous tissue.  Then a 22-gauge 5in spinal needle was inserted targeting the junction of the left S1 superior articular process and sacral ala junction.  Needle was advanced under fluoroscopic guidance.  Bone contact was made. Isovue 200 was injected x 0.5 mL demonstrating no intravascular uptake.  Then a solution containing  2% MPF lidocaine was injected x 0.5 mL.  Then the left L5 superior articular process in transverse process junction was targeted.  Bone contact was made. Isovue 200 was injected x 0.5 mL demonstrating no intravascular uptake.  Then a solution containing 2% MPF lidocaine was injected x 0.5 mL.  Then the left L4 superior articular process in transverse process junction was targeted.  Bone contact was made.  Isovue 200 was injected x 0.5 mL demonstrating no intravascular uptake.  Then a solution containing  2% MPF lidocaine was injected x 0.5 mL.  Patient tolerated procedure well.  Post procedure instructions were given.  Pre inj pain 7/10 Post inj pain 2/10

## 2017-09-05 NOTE — Patient Instructions (Signed)
Steps to Quit Smoking Smoking tobacco can be bad for your health. It can also affect almost every organ in your body. Smoking puts you and people around you at risk for many serious long-lasting (chronic) diseases. Quitting smoking is hard, but it is one of the best things that you can do for your health. It is never too late to quit. What are the benefits of quitting smoking? When you quit smoking, you lower your risk for getting serious diseases and conditions. They can include:  Lung cancer or lung disease.  Heart disease.  Stroke.  Heart attack.  Not being able to have children (infertility).  Weak bones (osteoporosis) and broken bones (fractures).  If you have coughing, wheezing, and shortness of breath, those symptoms may get better when you quit. You may also get sick less often. If you are pregnant, quitting smoking can help to lower your chances of having a baby of low birth weight. What can I do to help me quit smoking? Talk with your doctor about what can help you quit smoking. Some things you can do (strategies) include:  Quitting smoking totally, instead of slowly cutting back how much you smoke over a period of time.  Going to in-person counseling. You are more likely to quit if you go to many counseling sessions.  Using resources and support systems, such as: ? Online chats with a counselor. ? Phone quitlines. ? Printed self-help materials. ? Support groups or group counseling. ? Text messaging programs. ? Mobile phone apps or applications.  Taking medicines. Some of these medicines may have nicotine in them. If you are pregnant or breastfeeding, do not take any medicines to quit smoking unless your doctor says it is okay. Talk with your doctor about counseling or other things that can help you.  Talk with your doctor about using more than one strategy at the same time, such as taking medicines while you are also going to in-person counseling. This can help make  quitting easier. What things can I do to make it easier to quit? Quitting smoking might feel very hard at first, but there is a lot that you can do to make it easier. Take these steps:  Talk to your family and friends. Ask them to support and encourage you.  Call phone quitlines, reach out to support groups, or work with a counselor.  Ask people who smoke to not smoke around you.  Avoid places that make you want (trigger) to smoke, such as: ? Bars. ? Parties. ? Smoke-break areas at work.  Spend time with people who do not smoke.  Lower the stress in your life. Stress can make you want to smoke. Try these things to help your stress: ? Getting regular exercise. ? Deep-breathing exercises. ? Yoga. ? Meditating. ? Doing a body scan. To do this, close your eyes, focus on one area of your body at a time from head to toe, and notice which parts of your body are tense. Try to relax the muscles in those areas.  Download or buy apps on your mobile phone or tablet that can help you stick to your quit plan. There are many free apps, such as QuitGuide from the CDC (Centers for Disease Control and Prevention). You can find more support from smokefree.gov and other websites.  This information is not intended to replace advice given to you by your health care provider. Make sure you discuss any questions you have with your health care provider. Document Released: 03/10/2009 Document   Revised: 01/10/2016 Document Reviewed: 09/28/2014 Elsevier Interactive Patient Education  2018 Elsevier Inc.  

## 2017-09-05 NOTE — Progress Notes (Signed)
Patient ID: Lucas Small, male    DOB: 25-Apr-1966  Age: 52 y.o. MRN: [id number]    Subjective:  Subjective  HPI Lucas Small presents for f/u pain and smoking  He would like to quit smoking and is interested in trying chantix.   He had an injection this am with Dr Ella Bodo for pain and states he is feeling better but wanted refills on ultram and flexeril and wondered about inc dose on flexeril.  He did not d/w with pain management.   Review of Systems  Constitutional: Negative for fatigue and unexpected weight change.  Respiratory: Negative for cough and shortness of breath.   Cardiovascular: Negative for chest pain and palpitations.    History Past Medical History:  Diagnosis Date  . Alcoholic ([place])   . CHF (congestive heart failure) ([place])   . Depression   . Diabetes mellitus without complication (HCC)    metformin  . Dyspnea    with exertion .  Marland Kitchen Dysrhythmia    while hospitalized in [place]  . Gastric rupture   . History of kidney stones   . Hypertension   . MRSA (methicillin resistant Staphylococcus aureus)   . Multiple myeloma ([place]) 1990's  . Perforation bowel ([place])   . PNA (pneumonia)   . Renal disorder   . Renal failure   . Renal insufficiency   . Respiratory failure ([place])   . Sickle cell anemia (HCC)   . Sleep apnea    no CPAP machine    He has a past surgical history that includes Abdominal surgery; Cardiac surgery; Peripherally inserted central catheter insertion; Ileostomy; Hernia repair; and Appendectomy.   His family history includes Heart disease in his mother; Heart failure in his mother.He reports that he has been smoking cigarettes.  He has a 42.00 pack-year smoking history. He has never used smokeless tobacco. He reports that he drinks about 4.2 oz of alcohol per week. He reports that he does not use drugs.  Current Outpatient Medications on File Prior to Visit  Medication Sig Dispense Refill  . cyclobenzaprine (FLEXERIL) 10 MG tablet Take 1  tablet (10 mg total) by mouth 3 (three) times daily as needed for muscle spasms. 30 tablet 0  . DULoxetine HCl 40 MG CPEP Take 40 mg by mouth daily. 90 capsule 0  . ezetimibe (ZETIA) 10 MG tablet Take 1 tablet (10 mg total) by mouth daily. 90 tablet 1  . fenofibrate 160 MG tablet Take 1 tablet (160 mg total) by mouth daily. 90 tablet 1  . gabapentin (NEURONTIN) 300 MG capsule Take 1 capsule (300 mg total) by mouth 3 (three) times daily. 90 capsule 1  . lamoTRIgine (LAMICTAL) 150 MG tablet Take 1 tablet (150 mg total) by mouth daily. 90 tablet 0  . metFORMIN (GLUCOPHAGE) 1000 MG tablet Take 1 tablet (1,000 mg total) by mouth 2 (two) times daily with a meal. 180 tablet 0  . metoprolol succinate (TOPROL-XL) 25 MG 24 hr tablet Take 1 tablet (25 mg total) by mouth daily. 90 tablet 3  . ondansetron (ZOFRAN) 8 MG tablet Take 1 tablet (8 mg total) by mouth every 8 (eight) hours as needed for nausea or vomiting. 30 tablet 0  . ONETOUCH DELICA LANCETS FINE MISC USE AS DIRECTED TO CHECK BLOOD SUGAR TWICE DAILY 100 each 0  . ONETOUCH VERIO test strip USE TWICE DAILY AS DIRECTED 100 each 1  . pantoprazole (PROTONIX) 40 MG tablet Take 1 tablet (40 mg total) by mouth daily. 30 tablet  11  . rosuvastatin (CRESTOR) 40 MG tablet Take 1 tablet (40 mg total) by mouth daily. 90 tablet 1  . sitaGLIPtin (JANUVIA) 100 MG tablet Take 1 tablet (100 mg total) by mouth daily. 90 tablet 1  . traMADol (ULTRAM) 50 MG tablet Take 1 tablet (50 mg total) by mouth every 8 (eight) hours as needed. 30 tablet 0  . traZODone (DESYREL) 150 MG tablet Take 1 tablet (150 mg total) by mouth at bedtime as needed for sleep. 90 tablet 0   No current facility-administered medications on file prior to visit.      Objective:  Objective  Physical Exam  Constitutional: He is oriented to person, place, and time. Vital signs are normal. He appears well-developed and well-nourished. He is sleeping.  HENT:  Head: Normocephalic and atraumatic.    Mouth/Throat: Oropharynx is clear and moist.  Eyes: Pupils are equal, round, and reactive to light. EOM are normal.  Neck: Normal range of motion. Neck supple. No thyromegaly present.  Cardiovascular: Normal rate and regular rhythm.  No murmur heard. Pulmonary/Chest: Effort normal and breath sounds normal. No respiratory distress. He has no wheezes. He has no rales. He exhibits no tenderness.  Musculoskeletal: He exhibits no edema or tenderness.  Neurological: He is alert and oriented to person, place, and time.  Skin: Skin is warm and dry.  Psychiatric: He has a normal mood and affect. His behavior is normal. Judgment and thought content normal.  Nursing note and vitals reviewed.  BP 122/78 (BP Location: Left Arm, Patient Position: Sitting, Cuff Size: Normal)   Pulse 75   Resp 16   Ht 5' 8.5" (1.74 m)   Wt 202 lb 12.8 oz (92 kg)   SpO2 98%   BMI 30.39 kg/m  Wt Readings from Last 3 Encounters:  09/05/17 202 lb 12.8 oz (92 kg)  09/05/17 203 lb (92.1 kg)  09/04/17 202 lb 12.8 oz (92 kg)     Lab Results  Component Value Date   WBC 8.8 09/04/2017   HGB 15.8 09/04/2017   HCT 46.0 09/04/2017   PLT 321.0 09/04/2017   GLUCOSE 96 09/04/2017   CHOL 127 08/13/2017   TRIG 111.0 08/13/2017   HDL 37.90 (L) 08/13/2017   LDLDIRECT 156.0 05/09/2017   LDLCALC 67 08/13/2017   ALT 35 09/04/2017   AST 25 09/04/2017   NA 138 09/04/2017   K 4.5 09/04/2017   CL 104 09/04/2017   CREATININE 1.13 09/04/2017   BUN 14 09/04/2017   CO2 30 09/04/2017   TSH 1.59 02/13/2016   INR 1.0 09/04/2017   HGBA1C 6.8 (H) 08/13/2017   MICROALBUR 2.5 (H) 02/13/2016    Dg Lumbar Spine Complete  Result Date: 05/10/2017 CLINICAL DATA:  Chronic right-sided back pain radiating into the right leg, no known injury, initial encounter EXAM: LUMBAR SPINE - COMPLETE 4+ VIEW COMPARISON:  10/15/2016 FINDINGS: Five lumbar type vertebral bodies are well visualized. Vertebral body height is well maintained. No pars  defects are noted. No anterolisthesis is seen. Mild aortic calcifications are noted. Stable calcification is noted over the lower pole of the left kidney measuring approximately 8 mm consistent with a nonobstructing renal stone. No other focal abnormality is seen. IMPRESSION: No acute abnormality in the lumbar spine. Left renal stone in the lower pole. Electronically Signed   By: Inez Catalina M.D.   On: 05/10/2017 08:32     Assessment & Plan:  Plan  I am having [name] start on varenicline. I am also  having him maintain his rosuvastatin, fenofibrate, ONETOUCH DELICA LANCETS FINE, gabapentin, ONETOUCH VERIO, ezetimibe, sitaGLIPtin, cyclobenzaprine, metoprolol succinate, ondansetron, traZODone, lamoTRIgine, DULoxetine HCl, traMADol, metFORMIN, and pantoprazole.  Meds ordered this encounter  Medications  . varenicline (CHANTIX STARTING MONTH PAK) 0.5 MG X 11 & 1 MG X 42 tablet    Sig: Take one 0.5 mg tablet by mouth once daily for 3 days, then increase to one 0.5 mg tablet twice daily for 4 days, then increase to one 1 mg tablet twice daily.    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  Ann Held, DO

## 2017-09-06 ENCOUNTER — Telehealth: Payer: Self-pay | Admitting: *Deleted

## 2017-09-06 ENCOUNTER — Telehealth: Payer: Self-pay

## 2017-09-06 MED ORDER — PANTOPRAZOLE SODIUM 40 MG PO TBEC: 40.0000 mg | DELAYED_RELEASE_TABLET | Freq: Every day | ORAL | 3 refills | Status: DC

## 2017-09-06 NOTE — Telephone Encounter (Signed)
Error

## 2017-09-06 NOTE — Telephone Encounter (Signed)
No problem--- just wanted to make sure before we refilled them

## 2017-09-06 NOTE — Telephone Encounter (Signed)
AKLTYVD, CMA called from pt PCP. PCP wanted to know if she needs to continue prescribing Tramadol for pt or will you take over prescribing Tramadol for him. [phone]

## 2017-09-06 NOTE — Telephone Encounter (Signed)
[facility] and informed her of Dr. Letta Pate response. She will relay message to PCP.

## 2017-09-06 NOTE — Telephone Encounter (Signed)
I thought the PCP would cont, I would try some interventional procedures

## 2017-09-17 NOTE — Telephone Encounter (Signed)
Tier exception denied.

## 2017-10-03 ENCOUNTER — Ambulatory Visit: Payer: PPO | Admitting: Physical Medicine & Rehabilitation

## 2017-10-03 ENCOUNTER — Other Ambulatory Visit: Payer: Self-pay

## 2017-10-03 ENCOUNTER — Telehealth: Payer: Self-pay | Admitting: Family Medicine

## 2017-10-03 ENCOUNTER — Encounter: Payer: Self-pay | Admitting: Physical Medicine & Rehabilitation

## 2017-10-03 ENCOUNTER — Encounter: Payer: PPO | Attending: Physical Medicine & Rehabilitation

## 2017-10-03 VITALS — BP 123/78 | HR 92 | Ht 68.0 in | Wt 196.4 lb

## 2017-10-03 DIAGNOSIS — M488X6 Other specified spondylopathies, lumbar region: Secondary | ICD-10-CM | POA: Diagnosis not present

## 2017-10-03 DIAGNOSIS — M47816 Spondylosis without myelopathy or radiculopathy, lumbar region: Secondary | ICD-10-CM | POA: Diagnosis not present

## 2017-10-03 DIAGNOSIS — G8929 Other chronic pain: Secondary | ICD-10-CM | POA: Diagnosis not present

## 2017-10-03 DIAGNOSIS — M5441 Lumbago with sciatica, right side: Secondary | ICD-10-CM

## 2017-10-03 DIAGNOSIS — Z5181 Encounter for therapeutic drug level monitoring: Secondary | ICD-10-CM | POA: Diagnosis not present

## 2017-10-03 DIAGNOSIS — M545 Low back pain: Secondary | ICD-10-CM | POA: Insufficient documentation

## 2017-10-03 DIAGNOSIS — R110 Nausea: Secondary | ICD-10-CM

## 2017-10-03 MED ORDER — CYCLOBENZAPRINE HCL 10 MG PO TABS: 10.0000 mg | ORAL_TABLET | Freq: Every day | ORAL | 5 refills | Status: AC

## 2017-10-03 MED ORDER — TRAMADOL HCL 50 MG PO TABS: 50.0000 mg | ORAL_TABLET | Freq: Every evening | ORAL | 5 refills | Status: DC

## 2017-10-03 NOTE — Telephone Encounter (Signed)
Copied from [place]. Topic: Quick Communication - Rx Refill/Question >> Oct 03, 2017 10:40 AM Synthia Innocent wrote: Medication: ondansetron (ZOFRAN) 8 MG tablet  Has the patient contacted their pharmacy? Yes.   (Agent: If no, request that the patient contact the pharmacy for the refill.) Preferred Pharmacy (with phone number or street name): [facility] on [address]: Please be advised that RX refills may take up to 3 business days. We ask that you follow-up with your pharmacy.

## 2017-10-03 NOTE — Progress Notes (Signed)
  Left Lumbar L3, L4  medial branch blocks and L 5 dorsal ramus injection under fluoroscopic guidance   Indication: Left Lumbar pain which is not relieved by medication management or other conservative care and interfering with self-care and mobility.  Informed consent was obtained after describing risks and benefits of the procedure with the patient, this includes bleeding, bruising, infection, paralysis and medication side effects.  The patient wishes to proceed and has given written consent.  The patient was placed in a prone position.  The lumbar area was marked and prepped with Betadine.  One mL of 1% lidocaine was injected into each of 3 areas into the skin and subcutaneous tissue.  Then a 22-gauge 5in spinal needle was inserted targeting the junction of the left S1 superior articular process and sacral ala junction.  Needle was advanced under fluoroscopic guidance.  Bone contact was made. Isovue 200 was injected x 0.5 mL demonstrating no intravascular uptake.  Then a solution containing  2% MPF lidocaine was injected x 0.5 mL.  Then the left L5 superior articular process in transverse process junction was targeted.  Bone contact was made. Isovue 200 was injected x 0.5 mL demonstrating no intravascular uptake.  Then a solution containing 2% MPF lidocaine was injected x 0.5 mL.  Then the left L4 superior articular process in transverse process junction was targeted.  Bone contact was made.  Isovue 200 was injected x 0.5 mL demonstrating no intravascular uptake.  Then a solution containing  2% MPF lidocaine was injected x 0.5 mL.  Patient tolerated procedure well.  Post procedure instructions were given.  Pre inj pain 6/10 Post inj pain 3/10  Schedule for Left L3-4-5 RF next month  CSA and UDS- PCP request we take over Rx for Tramadol and cyclobenzaprine- see orders

## 2017-10-03 NOTE — Progress Notes (Signed)
  PROCEDURE RECORD [facility]   Name: DEION SWIFT DOB:12-21-1965 MRN: [id number]  Date:10/03/2017  Physician: Alysia Penna, MD    Nurse/CMA: Bright CMA  Allergies:  Allergies  Allergen Reactions  . Bee Venom Anaphylaxis    Consent Signed: Yes.    Is patient diabetic? Yes.    CBG today? 115  Pregnant: No. LMP: No LMP for male patient. (age 51-55)  Anticoagulants: no Anti-inflammatory: no Antibiotics: no  Procedure: Left L3-5 MBB Position: Prone   Start Time: 958am End Time: 1002am Fluoro Time: 27s  RN/CMA shumaker RN Bright CMA    Time 932am     BP 123/78     Pulse 92     Respirations 16 16    O2 Sat 96     S/S 6 6    Pain Level 6/10 3/10     D/C home with self, patient A & O X 3, D/C instructions reviewed, and sits independently.

## 2017-10-03 NOTE — Patient Instructions (Signed)

## 2017-10-04 MED ORDER — ONDANSETRON HCL 8 MG PO TABS
8.0000 mg | ORAL_TABLET | Freq: Three times a day (TID) | ORAL | 0 refills | Status: DC | PRN
Start: 2017-10-04 — End: 2020-07-29

## 2017-10-10 ENCOUNTER — Telehealth: Payer: Self-pay | Admitting: *Deleted

## 2017-10-10 NOTE — Telephone Encounter (Addendum)
He has appt with you on 10/31/17 for procedure. Formal warning letter sent though MyChart.

## 2017-10-10 NOTE — Telephone Encounter (Signed)
Urine drug screen is positive for ETOH.

## 2017-10-10 NOTE — Telephone Encounter (Signed)
Please inform patient that any further positive ethanol on urine screen will result in discontinuation of tramadol prescription. Have patient see Zella Ball in 1 month and check repeat UDS at that time

## 2017-10-14 LAB — TOXASSURE SELECT,+ANTIDEPR,UR

## 2017-10-15 ENCOUNTER — Ambulatory Visit (HOSPITAL_COMMUNITY): Payer: PPO | Admitting: Psychiatry

## 2017-10-15 ENCOUNTER — Encounter (HOSPITAL_COMMUNITY): Payer: Self-pay | Admitting: Psychiatry

## 2017-10-15 DIAGNOSIS — M549 Dorsalgia, unspecified: Secondary | ICD-10-CM | POA: Diagnosis not present

## 2017-10-15 DIAGNOSIS — M2550 Pain in unspecified joint: Secondary | ICD-10-CM | POA: Diagnosis not present

## 2017-10-15 DIAGNOSIS — F3131 Bipolar disorder, current episode depressed, mild: Secondary | ICD-10-CM | POA: Diagnosis not present

## 2017-10-15 DIAGNOSIS — Z62810 Personal history of physical and sexual abuse in childhood: Secondary | ICD-10-CM | POA: Diagnosis not present

## 2017-10-15 DIAGNOSIS — Z62811 Personal history of psychological abuse in childhood: Secondary | ICD-10-CM

## 2017-10-15 DIAGNOSIS — F1010 Alcohol abuse, uncomplicated: Secondary | ICD-10-CM | POA: Diagnosis not present

## 2017-10-15 DIAGNOSIS — F17210 Nicotine dependence, cigarettes, uncomplicated: Secondary | ICD-10-CM | POA: Diagnosis not present

## 2017-10-15 MED ORDER — DULOXETINE HCL 60 MG PO CPEP
60.0000 mg | ORAL_CAPSULE | Freq: Every day | ORAL | 0 refills | Status: DC
Start: 2017-10-15 — End: 2018-03-06

## 2017-10-15 MED ORDER — TRAZODONE HCL 150 MG PO TABS: 150.0000 mg | ORAL_TABLET | Freq: Every evening | ORAL | 0 refills | Status: DC | PRN

## 2017-10-15 MED ORDER — LAMOTRIGINE 150 MG PO TABS: 150.0000 mg | ORAL_TABLET | Freq: Every day | ORAL | 0 refills | Status: DC
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
## 2017-10-31 ENCOUNTER — Ambulatory Visit: Payer: Self-pay | Admitting: Physical Medicine & Rehabilitation

## 2017-11-07 ENCOUNTER — Ambulatory Visit (HOSPITAL_BASED_OUTPATIENT_CLINIC_OR_DEPARTMENT_OTHER): Payer: PPO | Admitting: Physical Medicine & Rehabilitation

## 2017-11-07 ENCOUNTER — Encounter: Payer: Self-pay | Admitting: Physical Medicine & Rehabilitation

## 2017-11-07 ENCOUNTER — Encounter: Payer: PPO | Attending: Physical Medicine & Rehabilitation

## 2017-11-07 VITALS — BP 116/75 | HR 82 | Resp 14 | Ht 68.0 in | Wt 190.0 lb

## 2017-11-07 DIAGNOSIS — M545 Low back pain: Secondary | ICD-10-CM | POA: Insufficient documentation

## 2017-11-07 DIAGNOSIS — G8929 Other chronic pain: Secondary | ICD-10-CM | POA: Diagnosis not present

## 2017-11-07 DIAGNOSIS — M488X6 Other specified spondylopathies, lumbar region: Secondary | ICD-10-CM | POA: Insufficient documentation

## 2017-11-07 DIAGNOSIS — G894 Chronic pain syndrome: Secondary | ICD-10-CM

## 2017-11-07 DIAGNOSIS — Z79891 Long term (current) use of opiate analgesic: Secondary | ICD-10-CM

## 2017-11-07 DIAGNOSIS — M5441 Lumbago with sciatica, right side: Secondary | ICD-10-CM

## 2017-11-07 DIAGNOSIS — Z5181 Encounter for therapeutic drug level monitoring: Secondary | ICD-10-CM | POA: Diagnosis not present

## 2017-11-07 MED ORDER — TRAMADOL HCL 50 MG PO TABS: 50.0000 mg | ORAL_TABLET | Freq: Two times a day (BID) | ORAL | 5 refills | Status: DC

## 2017-11-07 NOTE — Progress Notes (Signed)
Subjective:    Patient ID: Lucas Small, male    DOB: 01-09-1966, 52 y.o.   MRN: [id number]  HPI   Pain Inventory Average Pain 7 Pain Right Now 7 My pain is sharp, dull, stabbing and aching  In the last 24 hours, has pain interfered with the following? General activity 7 Relation with others 4 Enjoyment of life 7 What TIME of day is your pain at its worst? evening, night  Sleep (in general) Fair  Pain is worse with: walking, bending, sitting, standing and some activites Pain improves with: pacing activities Relief from Meds: 5  Mobility walk without assistance how many minutes can you walk? 15 ability to climb steps?  yes do you drive?  no transfers alone Do you have any goals in this area?  yes  Function disabled: date disabled . I need assistance with the following:  household duties and shopping  Neuro/Psych bladder control problems bowel control problems weakness numbness tingling trouble walking spasms dizziness depression anxiety  Prior Studies Any changes since last visit?  no  Physicians involved in your care Any changes since last visit?  no   Family History  Problem Relation Age of Onset  . Heart disease Mother   . Heart failure Mother   . Colon cancer Neg Hx   . Esophageal cancer Neg Hx   . Pancreatic cancer Neg Hx   . Stomach cancer Neg Hx   . Liver cancer Neg Hx    Social History   Socioeconomic History  . Marital status: Divorced    Spouse name: Not on file  . Number of children: Not on file  . Years of education: Not on file  . Highest education level: Not on file  Occupational History    Comment: disabled  Social Needs  . Financial resource strain: Not on file  . Food insecurity:    Worry: Not on file    Inability: Not on file  . Transportation needs:    Medical: Not on file    Non-medical: Not on file  Tobacco Use  . Smoking status: Current Every Day Smoker    Packs/day: 1.50    Years: 28.00    Pack years:  42.00    Types: Cigarettes  . Smokeless tobacco: Never Used  . Tobacco comment: Reports his quit date is tomorrow, 10/16/17 and has cut back to 5 a day.  Substance and Sexual Activity  . Alcohol use: Not Currently    Comment: Reports has not drunk anything in 2 months  . Drug use: No    Comment: THC cocaine  . Sexual activity: Not on file    Comment: Not asked  Lifestyle  . Physical activity:    Days per week: Not on file    Minutes per session: Not on file  . Stress: Not on file  Relationships  . Social connections:    Talks on phone: Not on file    Gets together: Not on file    Attends religious service: Not on file    Active member of club or organization: Not on file    Attends meetings of clubs or organizations: Not on file    Relationship status: Not on file  Other Topics Concern  . Not on file  Social History Narrative   Exercise---  Walking some --  Not enough   Past Surgical History:  Procedure Laterality Date  . ABDOMINAL SURGERY    . APPENDECTOMY    . CARDIAC SURGERY    .  HERNIA REPAIR    . ILEOSTOMY    . PERIPHERALLY INSERTED CENTRAL CATHETER INSERTION     Past Medical History:  Diagnosis Date  . Alcoholic ([place])   . CHF (congestive heart failure) ([place])   . Depression   . Diabetes mellitus without complication (HCC)    metformin  . Dyspnea    with exertion .  Marland Kitchen Dysrhythmia    while hospitalized in [place]  . Gastric rupture   . History of kidney stones   . Hypertension   . MRSA (methicillin resistant Staphylococcus aureus)   . Multiple myeloma ([place]) 1990's  . Perforation bowel ([place])   . PNA (pneumonia)   . Renal disorder   . Renal failure   . Renal insufficiency   . Respiratory failure ([place])   . Sickle cell anemia (HCC)   . Sleep apnea    no CPAP machine   BP 116/75   Pulse 82   Resp 14   Ht '5\' 8"'$  (1.727 m)   Wt 190 lb (86.2 kg)   SpO2 94%   BMI 28.89 kg/m   Opioid Risk Score:   Fall Risk Score:  `1  Depression screen PHQ  2/9  Depression screen [facility] 2/9 10/03/2017 08/15/2017 02/13/2016 02/13/2016 07/11/2015 11/24/2014  Decreased Interest 2 2 0 0 0 0  Down, Depressed, Hopeless 2 2 0 0 1 0  PHQ - 2 Score 4 4 0 0 1 0  Altered sleeping - 3 - - - -  Tired, decreased energy - 2 - - - -  Change in appetite - 1 - - - -  Feeling bad or failure about yourself  - 1 - - - -  Trouble concentrating - 2 - - - -  Moving slowly or fidgety/restless - 3 - - - -  Suicidal thoughts - 0 - - - -  PHQ-9 Score - 16 - - - -  Difficult doing work/chores - Somewhat difficult - - - -    Review of Systems  Constitutional: Positive for diaphoresis and unexpected weight change.  HENT: Negative.   Eyes: Negative.   Respiratory: Positive for apnea.   Cardiovascular: Negative.   Gastrointestinal: Positive for abdominal pain, constipation and diarrhea.  Endocrine: Negative.        High blood sugar   Musculoskeletal: Positive for back pain, gait problem and myalgias.       Spasms   Allergic/Immunologic: Negative.   Neurological: Positive for dizziness, weakness and numbness.       Tingling   Psychiatric/Behavioral: Positive for dysphoric mood. The patient is nervous/anxious.   All other systems reviewed and are negative.      Objective:   Physical Exam        Assessment & Plan:

## 2017-11-07 NOTE — Patient Instructions (Signed)

## 2017-11-07 NOTE — Progress Notes (Signed)
Please see CMA note and documentation for review of systems as well as past history.  Patient originally scheduled for radiofrequency procedure today.  He has undergone left-sided L3-L4 medial branch and left L5 dorsal ramus injection under fluoroscopic guidance.  His first set of injections was performed on 09/05/2017 and his pre-injection pain was 8 out of 10 postinjection pain was 2 out of 10.  His pain relief lasted about a week Patient had a repeat left-sided L3-L4 medial branch and left L5 dorsal ramus injection under fluoroscopic guidance performed on 09/03/2017 preinjection pain level 6/10 postinjection pain level 3/10.  Patient stated that by the time he got in his car his pain had returned. We discussed the significance of these findings, we discussed that if the injection was not at the nerve that was producing his usual pain he should not get any relief.  The duration of the second injection was shorter than the anesthetic phase and therefore is in question.  We discussed that is still reasonable to proceed with radiofrequency neurotomy however the patient would not like to proceed with this.  Patient's functional status remains independent  He continues to follow-up with psychiatry for his bipolar disorder. He had urine toxicology performed 10/03/2017 demonstrating positive ETS and ETG.    His examination today has normal motor strength bilateral hip flexor knee extensor ankle dorsiflexors Negative straight leg raise Ambulates without assistive device no evidence of toe drag or knee instability. His lumbar spine has mild tenderness palpation in the left lumbar paraspinals from L 4 through S1 His lumbar range of motion is limited approximately 50% flexion extension lateral bending and rotation.  Impression 1.  Lumbar spondylosis without myelopathy, his rehabilitation is hindered by history of partial resection of abdominal muscles after abdominal infection. He is a candidate for  radiofrequency neurotomy but he declines to proceed at this time.  He like to try medication management. Because of his history of bipolar disorder as well as history of alcohol abuse with keep dose to 50 mg of tramadol twice daily maximum.  He has been counseled in terms of cessation of alcohol if he is taking this medication.  We will repeat urine drug screen today.  He just signed his controlled substance agreement so that will be in effect during today.  We will recheck his urine toxicology in 6 months and if still showing signs of alcohol use would discontinue tramadol

## 2017-11-09 DIAGNOSIS — R0789 Other chest pain: Secondary | ICD-10-CM | POA: Diagnosis not present

## 2017-11-09 DIAGNOSIS — R402441 Other coma, without documented Glasgow coma scale score, or with partial score reported, in the field [EMT or ambulance]: Secondary | ICD-10-CM | POA: Diagnosis not present

## 2017-11-09 DIAGNOSIS — R1084 Generalized abdominal pain: Secondary | ICD-10-CM | POA: Diagnosis not present

## 2017-11-09 DIAGNOSIS — R079 Chest pain, unspecified: Secondary | ICD-10-CM | POA: Diagnosis not present

## 2017-11-09 DIAGNOSIS — R569 Unspecified convulsions: Secondary | ICD-10-CM | POA: Diagnosis not present

## 2017-11-10 ENCOUNTER — Emergency Department (HOSPITAL_COMMUNITY)
Admission: EM | Admit: 2017-11-10 | Discharge: 2017-11-10 | Disposition: A | Discharge status: Home / Self Care | Payer: PPO | Attending: Emergency Medicine | Admitting: Emergency Medicine

## 2017-11-10 ENCOUNTER — Encounter (HOSPITAL_COMMUNITY): Payer: Self-pay

## 2017-11-10 ENCOUNTER — Emergency Department (HOSPITAL_COMMUNITY): Payer: PPO

## 2017-11-10 DIAGNOSIS — N189 Chronic kidney disease, unspecified: Secondary | ICD-10-CM | POA: Diagnosis not present

## 2017-11-10 DIAGNOSIS — I130 Hypertensive heart and chronic kidney disease with heart failure and stage 1 through stage 4 chronic kidney disease, or unspecified chronic kidney disease: Secondary | ICD-10-CM | POA: Insufficient documentation

## 2017-11-10 DIAGNOSIS — R109 Unspecified abdominal pain: Secondary | ICD-10-CM | POA: Insufficient documentation

## 2017-11-10 DIAGNOSIS — R1031 Right lower quadrant pain: Secondary | ICD-10-CM | POA: Diagnosis not present

## 2017-11-10 DIAGNOSIS — I509 Heart failure, unspecified: Secondary | ICD-10-CM | POA: Diagnosis not present

## 2017-11-10 DIAGNOSIS — F17210 Nicotine dependence, cigarettes, uncomplicated: Secondary | ICD-10-CM | POA: Diagnosis not present

## 2017-11-10 DIAGNOSIS — R079 Chest pain, unspecified: Secondary | ICD-10-CM | POA: Diagnosis not present

## 2017-11-10 DIAGNOSIS — E1151 Type 2 diabetes mellitus with diabetic peripheral angiopathy without gangrene: Secondary | ICD-10-CM | POA: Diagnosis not present

## 2017-11-10 DIAGNOSIS — R000 Tachycardia, unspecified: Secondary | ICD-10-CM | POA: Diagnosis not present

## 2017-11-10 DIAGNOSIS — R1011 Right upper quadrant pain: Secondary | ICD-10-CM | POA: Diagnosis not present

## 2017-11-10 HISTORY — DX: Malingerer (conscious simulation): Z76.5

## 2017-11-10 LAB — COMPREHENSIVE METABOLIC PANEL
ALK PHOS: 52 U/L (ref 38–126)
ALT: 34 U/L (ref 17–63)
AST: 61 U/L — AB (ref 15–41)
Albumin: 4.1 g/dL (ref 3.5–5.0)
Anion gap: 14 (ref 5–15)
BUN: 7 mg/dL (ref 6–20)
CHLORIDE: 109 mmol/L (ref 101–111)
CO2: 21 mmol/L — AB (ref 22–32)
Calcium: 9.3 mg/dL (ref 8.9–10.3)
Creatinine, Ser: 1.15 mg/dL (ref 0.61–1.24)
GFR calc Af Amer: >60 mL/min (ref >60)
Glucose, Bld: 119 mg/dL — ABNORMAL HIGH (ref 65–99)
Potassium: 3.4 mmol/L — ABNORMAL LOW (ref 3.5–5.1)
Sodium: 144 mmol/L (ref 135–145)
Total Bilirubin: 0.6 mg/dL (ref 0.3–1.2)
Total Protein: 7.2 g/dL (ref 6.5–8.1)

## 2017-11-10 LAB — CBC WITH DIFFERENTIAL/PLATELET
ABS IMMATURE GRANULOCYTES: 0 10*3/uL (ref 0.0–0.1)
Basophils Absolute: 0.1 10*3/uL (ref 0.0–0.1)
Basophils Relative: 1 %
Eosinophils Absolute: 0.2 10*3/uL (ref 0.0–0.7)
Eosinophils Relative: 2 %
HCT: 50.9 % (ref 39.0–52.0)
HEMOGLOBIN: 17.2 g/dL — AB (ref 13.0–17.0)
IMMATURE GRANULOCYTES: 0 %
LYMPHS ABS: 2.5 10*3/uL (ref 0.7–4.0)
LYMPHS PCT: 25 %
MCH: 31.4 pg (ref 26.0–34.0)
MCHC: 33.8 g/dL (ref 30.0–36.0)
MCV: 92.9 fL (ref 78.0–100.0)
MONOS PCT: 9 %
Monocytes Absolute: 0.9 10*3/uL (ref 0.1–1.0)
NEUTROS ABS: 6.4 10*3/uL (ref 1.7–7.7)
NEUTROS PCT: 63 %
PLATELETS: 275 10*3/uL (ref 150–400)
RBC: 5.48 MIL/uL (ref 4.22–5.81)
RDW: 13.4 % (ref 11.5–15.5)
WBC: 10.1 10*3/uL (ref 4.0–10.5)

## 2017-11-10 LAB — LIPASE, BLOOD: Lipase: 33 U/L (ref 11–51)

## 2017-11-10 MED ORDER — ACETAMINOPHEN 325 MG PO TABS
650.0000 mg | ORAL_TABLET | Freq: Once | ORAL | Status: AC
  Administered 2017-11-10: 650 mg via ORAL
  Filled 2017-11-10: qty 2

## 2017-11-10 MED ORDER — LACTATED RINGERS IV BOLUS
1000.0000 mL | Freq: Once | INTRAVENOUS | Status: AC
  Administered 2017-11-10: 1000 mL via INTRAVENOUS
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
## 2017-11-10 NOTE — ED Notes (Signed)
Pt with drug seeking behavior states he wants klonopin, morphine or dilaudid

## 2017-11-10 NOTE — ED Triage Notes (Signed)
Pt comes via [facility] with [employer] after being arrested, c/o of abd pain RUQ and RLQ with tenderness on palpation, pt stated that he had CP earlier today. PTA 2.5 versed, ETOH on board. Pt states he has been constipated and the pain has been going on for three days

## 2017-11-10 NOTE — ED Notes (Signed)
Pt continues to ask for morphine.

## 2017-11-18 ENCOUNTER — Telehealth: Payer: Self-pay | Admitting: *Deleted

## 2017-11-18 LAB — TOXASSURE SELECT,+ANTIDEPR,UR

## 2017-11-18 NOTE — Telephone Encounter (Signed)
Urine drug screen is once again positive for alcohol,  and metabolite of cocaine (Benzoylecgonine).

## 2017-11-25 NOTE — Telephone Encounter (Signed)
Please inform patient we will not be prescribing tramadol

## 2017-11-26 NOTE — Telephone Encounter (Signed)
I tried to call but mailbox full and not accepting messages.  A MyChart message was sent. Do we leave the refills on the tramadol or do I need to cancel them?

## 2017-11-26 NOTE — Telephone Encounter (Signed)
[facility] pharmacy notified to cancel Rx sent over 11/07/17 for Tramadol w/5 refills.  Last fill per pmp was 10/03/17

## 2017-11-26 NOTE — Telephone Encounter (Signed)
Would cancel remaining tramadol RX

## 2017-12-05 ENCOUNTER — Other Ambulatory Visit: Payer: Self-pay | Admitting: Family Medicine
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
## 2017-12-06 DIAGNOSIS — E119 Type 2 diabetes mellitus without complications: Secondary | ICD-10-CM | POA: Diagnosis not present

## 2017-12-06 LAB — HM DIABETES EYE EXAM

## 2017-12-06 MED ORDER — GLUCOSE BLOOD VI STRP: ORAL_STRIP | 1 refills | Status: DC

## 2017-12-06 MED ORDER — ONETOUCH DELICA LANCETS FINE MISC: 0 refills | Status: DC
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
## 2017-12-10 ENCOUNTER — Ambulatory Visit (INDEPENDENT_AMBULATORY_CARE_PROVIDER_SITE_OTHER): Payer: PPO | Admitting: Family Medicine

## 2017-12-10 ENCOUNTER — Encounter: Payer: Self-pay | Admitting: Family Medicine

## 2017-12-10 ENCOUNTER — Other Ambulatory Visit: Payer: Self-pay

## 2017-12-10 VITALS — BP 138/88 | HR 104 | Temp 97.9°F | Ht 68.0 in | Wt 183.8 lb

## 2017-12-10 DIAGNOSIS — R000 Tachycardia, unspecified: Secondary | ICD-10-CM | POA: Diagnosis not present

## 2017-12-10 DIAGNOSIS — E669 Obesity, unspecified: Secondary | ICD-10-CM | POA: Diagnosis not present

## 2017-12-10 DIAGNOSIS — E1151 Type 2 diabetes mellitus with diabetic peripheral angiopathy without gangrene: Secondary | ICD-10-CM

## 2017-12-10 DIAGNOSIS — E785 Hyperlipidemia, unspecified: Secondary | ICD-10-CM

## 2017-12-10 DIAGNOSIS — I10 Essential (primary) hypertension: Secondary | ICD-10-CM | POA: Diagnosis not present

## 2017-12-10 DIAGNOSIS — F1014 Alcohol abuse with alcohol-induced mood disorder: Secondary | ICD-10-CM | POA: Diagnosis not present

## 2017-12-10 LAB — LIPID PANEL
CHOL/HDL RATIO: 3
Cholesterol: 138 mg/dL (ref 0–200)
HDL: 51 mg/dL (ref >39.00)
LDL Cholesterol: 56 mg/dL (ref 0–99)
NONHDL: 87.21
Triglycerides: 154 mg/dL — ABNORMAL HIGH (ref 0.0–149.0)
VLDL: 30.8 mg/dL (ref 0.0–40.0)

## 2017-12-10 LAB — COMPREHENSIVE METABOLIC PANEL
ALT: 19 U/L (ref 0–53)
AST: 14 U/L (ref 0–37)
Albumin: 4.2 g/dL (ref 3.5–5.2)
Alkaline Phosphatase: 71 U/L (ref 39–117)
BILIRUBIN TOTAL: 0.6 mg/dL (ref 0.2–1.2)
BUN: 12 mg/dL (ref 6–23)
CHLORIDE: 100 meq/L (ref 96–112)
CO2: 32 meq/L (ref 19–32)
Calcium: 9.7 mg/dL (ref 8.4–10.5)
Creatinine, Ser: 0.83 mg/dL (ref 0.40–1.50)
GFR: 103.79 mL/min (ref >60.00)
GLUCOSE: 170 mg/dL — AB (ref 70–99)
Potassium: 4.7 mEq/L (ref 3.5–5.1)
Sodium: 136 mEq/L (ref 135–145)
Total Protein: 6.8 g/dL (ref 6.0–8.3)

## 2017-12-10 LAB — HEMOGLOBIN A1C: HEMOGLOBIN A1C: 6.6 % — AB (ref 4.6–6.5)

## 2017-12-10 MED ORDER — METOPROLOL SUCCINATE ER 25 MG PO TB24: 25.0000 mg | ORAL_TABLET | Freq: Every day | ORAL | 3 refills | Status: DC

## 2017-12-10 NOTE — Assessment & Plan Note (Signed)
Cont with diet and exercise

## 2017-12-10 NOTE — Progress Notes (Signed)
Patient ID: Lucas Small, male    DOB: 08-07-1965  Age: 52 y.o. MRN: [id number]    Subjective:  Subjective  HPI Lucas Small presents for f/u dm, chol and bp.    HYPERTENSION   Blood pressure range-not checking   Chest pain- no      Dyspnea- no Lightheadedness- no   Edema- no  Other side effects - no   Medication compliance: poor Low salt diet- yes    DIABETES    Blood Sugar ranges-not checking   Polyuria- no New Visual problems- no  Hypoglycemic symptoms- no  Other side effects-no Medication compliance - poor Last eye exam- last friday Foot exam- today   HYPERLIPIDEMIA  Medication compliance- poor RUQ pain- no  Muscle aches- no Other side effects-no  Review of Systems  Constitutional: Negative for appetite change, chills, diaphoresis, fatigue, fever and unexpected weight change.  HENT: Negative for congestion and hearing loss.   Eyes: Negative for pain, discharge, redness and visual disturbance.  Respiratory: Negative for cough, chest tightness, shortness of breath and wheezing.   Cardiovascular: Negative for chest pain, palpitations and leg swelling.  Gastrointestinal: Negative for abdominal pain, blood in stool, constipation, diarrhea, nausea and vomiting.  Endocrine: Negative for cold intolerance, heat intolerance, polydipsia, polyphagia and polyuria.  Genitourinary: Negative for difficulty urinating, dysuria, frequency, hematuria and urgency.  Musculoskeletal: Negative for back pain and myalgias.  Skin: Negative for rash.  Allergic/Immunologic: Negative for environmental allergies.  Neurological: Negative for dizziness, weakness, light-headedness, numbness and headaches.  Hematological: Does not bruise/bleed easily.  Psychiatric/Behavioral: Negative for suicidal ideas. The patient is not nervous/anxious.     History Past Medical History:  Diagnosis Date  . Alcoholic ([place])   . CHF (congestive heart failure) ([place])   . Depression   . Diabetes mellitus  without complication (HCC)    metformin  . Drug-seeking behavior   . Dyspnea    with exertion .  Marland Kitchen Dysrhythmia    while hospitalized in [place]  . Gastric rupture   . History of kidney stones   . Hypertension   . MRSA (methicillin resistant Staphylococcus aureus)   . Multiple myeloma ([place]) 1990's  . Perforation bowel ([place])   . PNA (pneumonia)   . Renal disorder   . Renal failure   . Renal insufficiency   . Respiratory failure ([place])   . Sickle cell anemia (HCC)   . Sleep apnea    no CPAP machine    He has a past surgical history that includes Abdominal surgery; Cardiac surgery; Peripherally inserted central catheter insertion; Ileostomy; Hernia repair; and Appendectomy.   His family history includes Heart disease in his mother; Heart failure in his mother.He reports that he has been smoking cigarettes.  He has a 42.00 pack-year smoking history. He has never used smokeless tobacco. He reports that he drank alcohol. He reports that he does not use drugs.  Current Outpatient Medications on File Prior to Visit  Medication Sig Dispense Refill  . cyclobenzaprine (FLEXERIL) 10 MG tablet Take 1 tablet (10 mg total) by mouth at bedtime. 30 tablet 5  . DULoxetine (CYMBALTA) 60 MG capsule Take 1 capsule (60 mg total) by mouth daily. 90 capsule 0  . ezetimibe (ZETIA) 10 MG tablet Take 1 tablet (10 mg total) by mouth daily. 90 tablet 1  . fenofibrate 160 MG tablet Take 1 tablet (160 mg total) by mouth daily. 90 tablet 1  . gabapentin (NEURONTIN) 300 MG capsule Take 1 capsule (300 mg total) by  mouth 3 (three) times daily. 90 capsule 1  . glucose blood (ONETOUCH VERIO) test strip USE TWICE DAILY AS DIRECTED 100 each 1  . lamoTRIgine (LAMICTAL) 150 MG tablet Take 1 tablet (150 mg total) by mouth daily. 90 tablet 0  . metFORMIN (GLUCOPHAGE) 1000 MG tablet Take 1 tablet (1,000 mg total) by mouth 2 (two) times daily with a meal. 180 tablet 0  . metoprolol succinate (TOPROL-XL) 25 MG 24 hr tablet  Take 1 tablet (25 mg total) by mouth daily. 90 tablet 3  . ondansetron (ZOFRAN) 8 MG tablet Take 1 tablet (8 mg total) by mouth every 8 (eight) hours as needed for nausea or vomiting. 30 tablet 0  . ONETOUCH DELICA LANCETS FINE MISC USE AS DIRECTED TO CHECK BLOOD SUGAR TWICE DAILY 100 each 0  . pantoprazole (PROTONIX) 40 MG tablet Take 1 tablet (40 mg total) by mouth daily. 90 tablet 3  . rosuvastatin (CRESTOR) 40 MG tablet Take 1 tablet (40 mg total) by mouth daily. 90 tablet 1  . traMADol (ULTRAM) 50 MG tablet Take 1 tablet (50 mg total) by mouth 2 (two) times daily. 60 tablet 5  . traZODone (DESYREL) 150 MG tablet Take 1 tablet (150 mg total) by mouth at bedtime as needed for sleep. 90 tablet 0  . sitaGLIPtin (JANUVIA) 100 MG tablet Take 1 tablet (100 mg total) by mouth daily. (Patient not taking: Reported on 12/10/2017) 90 tablet 1  . varenicline (CHANTIX STARTING MONTH PAK) 0.5 MG X 11 & 1 MG X 42 tablet Take one 0.5 mg tablet by mouth once daily for 3 days, then increase to one 0.5 mg tablet twice daily for 4 days, then increase to one 1 mg tablet twice daily. (Patient not taking: Reported on 12/10/2017) 53 tablet 0   No current facility-administered medications on file prior to visit.      Objective:  Objective  Physical Exam  Constitutional: He is oriented to person, place, and time. Vital signs are normal. He appears well-developed and well-nourished. He is sleeping.  HENT:  Head: Normocephalic and atraumatic.  Mouth/Throat: Oropharynx is clear and moist.  Eyes: Pupils are equal, round, and reactive to light. EOM are normal.  Neck: Normal range of motion. Neck supple. No thyromegaly present.  Cardiovascular: Normal rate and regular rhythm.  No murmur heard. Pulmonary/Chest: Effort normal and breath sounds normal. No respiratory distress. He has no wheezes. He has no rales. He exhibits no tenderness.  Musculoskeletal: He exhibits no edema or tenderness.  Neurological: He is alert and  oriented to person, place, and time.  Skin: Skin is warm and dry.  Psychiatric: He has a normal mood and affect. His behavior is normal. Judgment and thought content normal.  Nursing note and vitals reviewed.  Diabetic Foot Exam - Simple   No data filed      BP 138/88 (BP Location: Left Arm, Patient Position: Sitting, Cuff Size: Normal)   Pulse (!) 104   Temp 97.9 F (36.6 C) (Oral)   Ht _0  (1.727 m)   Wt 183 lb 12.8 oz (83.4 kg)   SpO2 97%   BMI 27.95 kg/m  Wt Readings from Last 3 Encounters:  12/10/17 183 lb 12.8 oz (83.4 kg)  11/07/17 190 lb (86.2 kg)  10/03/17 196 lb 6.4 oz (89.1 kg)     Lab Results  Component Value Date   WBC 10.1 11/10/2017   HGB 17.2 (H) 11/10/2017   HCT 50.9 11/10/2017   PLT 275 11/10/2017  GLUCOSE 119 (H) 11/10/2017   CHOL 127 08/13/2017   TRIG 111.0 08/13/2017   HDL 37.90 (L) 08/13/2017   LDLDIRECT 156.0 05/09/2017   LDLCALC 67 08/13/2017   ALT 34 11/10/2017   AST 61 (H) 11/10/2017   NA 144 11/10/2017   K 3.4 (L) 11/10/2017   CL 109 11/10/2017   CREATININE 1.15 11/10/2017   BUN 7 11/10/2017   CO2 21 (L) 11/10/2017   TSH 1.59 02/13/2016   INR 1.0 09/04/2017   HGBA1C 6.8 (H) 08/13/2017   MICROALBUR 2.5 (H) 02/13/2016    Dg Abd Acute W/chest  Result Date: 11/10/2017 CLINICAL DATA:  Right upper quadrant and lower quadrant abdominal pain EXAM: DG ABDOMEN ACUTE W/ 1V CHEST COMPARISON:  10/15/2016 FINDINGS: Single-view chest demonstrates no acute opacity or pleural effusion. Normal heart size. No pneumothorax. Aortic atherosclerosis. Supine and upright views of the abdomen demonstrate no free air beneath the diaphragm. Mild diffuse increased bowel gas with gas present in the colon and rectum. Postsurgical changes in the pelvis. No radiopaque calculi. IMPRESSION: 1. No radiographic evidence for acute cardiopulmonary abnormality. 2. Mild diffuse increased bowel gas without obstructive pattern, question mild ileus Electronically Signed   By:  Donavan Foil M.D.   On: 11/10/2017 01:39     Assessment & Plan:  Plan  I am having [name] maintain his rosuvastatin, fenofibrate, gabapentin, ezetimibe, sitaGLIPtin, metoprolol succinate, metFORMIN, varenicline, pantoprazole, cyclobenzaprine, ondansetron, lamoTRIgine, DULoxetine, traZODone, traMADol, ONETOUCH DELICA LANCETS FINE, and glucose blood.  No orders of the defined types were placed in this encounter.   Problem List Items Addressed This Visit      Unprioritized   DM (diabetes mellitus) type II, controlled, with peripheral vascular disorder ([place]) - Primary    Check labs today hgba1c to be checked, minimize simple carbs. Increase exercise as tolerated. Continue current meds       Relevant Orders   Hemoglobin A1c   Comprehensive metabolic panel   Essential hypertension    Well controlled, no changes to meds. Encouraged heart healthy diet such as the DASH diet and exercise as tolerated.       Relevant Orders   Comprehensive metabolic panel   Hyperlipidemia LDL goal <70    Encouraged heart healthy diet, increase exercise, avoid trans fats, consider a krill oil cap daily Pt off meds-- check labs today      Relevant Orders   Lipid panel   Comprehensive metabolic panel   Obesity (BMI 30-39.9)    Cont with diet and exercise         Follow-up: Return in about 6 months (around 06/12/2018), or if symptoms worsen or fail to improve, for annual exam, fasting.  Ann Held, DO

## 2017-12-10 NOTE — Assessment & Plan Note (Signed)
Well controlled, no changes to meds. Encouraged heart healthy diet such as the DASH diet and exercise as tolerated.  °

## 2017-12-10 NOTE — Assessment & Plan Note (Signed)
Encouraged heart healthy diet, increase exercise, avoid trans fats, consider a krill oil cap daily Pt off meds-- check labs today

## 2017-12-10 NOTE — Patient Instructions (Signed)

## 2017-12-10 NOTE — Assessment & Plan Note (Signed)
uds + alcohol and cocaine  Pain man will not write tramadol

## 2017-12-10 NOTE — Assessment & Plan Note (Signed)
Check labs today hgba1c to be checked , minimize simple carbs. Increase exercise as tolerated. Continue current meds  
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
## 2017-12-11 ENCOUNTER — Other Ambulatory Visit: Payer: Self-pay | Admitting: *Deleted

## 2017-12-11 ENCOUNTER — Telehealth: Payer: Self-pay | Admitting: Pharmacist

## 2017-12-11 ENCOUNTER — Encounter: Payer: Self-pay | Admitting: *Deleted

## 2017-12-11 ENCOUNTER — Other Ambulatory Visit: Payer: Self-pay | Admitting: Family Medicine

## 2017-12-11 DIAGNOSIS — F3131 Bipolar disorder, current episode depressed, mild: Secondary | ICD-10-CM
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
## 2017-12-12 ENCOUNTER — Ambulatory Visit: Payer: Self-pay | Admitting: *Deleted

## 2017-12-12 MED ORDER — FENOFIBRATE 160 MG PO TABS: 160.0000 mg | ORAL_TABLET | Freq: Every day | ORAL | 1 refills | Status: DC

## 2017-12-12 MED ORDER — METFORMIN HCL 1000 MG PO TABS: 1000.0000 mg | ORAL_TABLET | Freq: Two times a day (BID) | ORAL | 0 refills | Status: DC

## 2017-12-12 MED ORDER — GABAPENTIN 300 MG PO CAPS: 300.0000 mg | ORAL_CAPSULE | Freq: Three times a day (TID) | ORAL | 1 refills | Status: DC

## 2017-12-13 ENCOUNTER — Encounter: Payer: Self-pay | Admitting: *Deleted

## 2017-12-13 NOTE — Patient Outreach (Signed)
[facility]) Care Management  12/13/2017  Lucas Small 06/04/1965 [id number]   CSW was able to make contact with patient today to follow-up regarding needed durable medical equipment (i.e CPAP Machine and Glucometer).  CSW inquired as to whether or not patient has contacted any of the agencies provided to him that may be able to assist with low cost CPAP Machines.  Patient reported that he has not, but that he plans to do so in the near future.  CSW explained to patient that; unfortunately, CSW will be unable to provide patient with a new Glucometer, as [facility] no longer orders them or has any in stock.  CSW encouraged patient to obtain a written order from his Primary Care Physician, [name] to purchase a new Glucometer.  Patient was then instructed to take the order to his pharmacist to have them provide him with the correct meter and strips.  Patient is aware that the pharmacist can submit the receipt to the insurance company so that patient will only have to pay the co-pay amount.  Patient knows that he will need to pay for the Glucometer out-of-pocket and then wait for partial reimbursement.  Patient reported that he may be able to borrow the money from a friend to purchase the meter.  CSW has cancelled her home visit with patient, scheduled for Wednesday, December 18, 2017, as CSW does not have a Glucometer to deliver and patient was unable to identify any additional social work needs at present. CSW will perform a case closure on patient, as all goals of treatment have been met from social work standpoint.  CSW will notify patient's Pharmacist with [facility], Katina Boyd of CSW's plans to close patient's case.  CSW will fax an update to patient's Primary Care Physician, [name] to ensure that they are aware of CSW's involvement with patient's plan of care.    , BSW, MSW, LCSW  Licensed  Clinical Social Worker  [facility]  Mailing Address-[address], [place], [place] [address] Physical Address-[address], [place], [place] [address] Toll Free Main # [phone] Fax # [phone] Cell # [phone]  Office # [phone] [email or web address]        

## 2017-12-16 NOTE — Addendum Note (Signed)
Addended byDamita Dunnings D on: 12/16/2017 11:54 AM   Modules accepted: Orders

## 2017-12-17 ENCOUNTER — Other Ambulatory Visit: Payer: Self-pay | Admitting: Pharmacist

## 2017-12-18 ENCOUNTER — Encounter: Payer: Self-pay | Admitting: *Deleted

## 2017-12-18 ENCOUNTER — Ambulatory Visit: Payer: Self-pay | Admitting: *Deleted
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
## 2018-01-09 NOTE — Addendum Note (Signed)
Addended by: Caro Hight on: 01/09/2018 03:27 PM   Modules accepted: Orders

## 2018-01-14 ENCOUNTER — Other Ambulatory Visit: Payer: Self-pay

## 2018-01-14 ENCOUNTER — Ambulatory Visit: Payer: PPO | Admitting: Physical Medicine & Rehabilitation

## 2018-01-14 ENCOUNTER — Encounter: Payer: PPO | Attending: Physical Medicine & Rehabilitation

## 2018-01-14 ENCOUNTER — Encounter: Payer: Self-pay | Admitting: Physical Medicine & Rehabilitation

## 2018-01-14 VITALS — BP 128/84 | HR 78 | Ht 69.0 in | Wt 183.6 lb

## 2018-01-14 DIAGNOSIS — M488X6 Other specified spondylopathies, lumbar region: Secondary | ICD-10-CM | POA: Diagnosis not present

## 2018-01-14 DIAGNOSIS — M545 Low back pain: Secondary | ICD-10-CM | POA: Diagnosis not present

## 2018-01-14 DIAGNOSIS — M47816 Spondylosis without myelopathy or radiculopathy, lumbar region: Secondary | ICD-10-CM

## 2018-01-14 DIAGNOSIS — G8929 Other chronic pain: Secondary | ICD-10-CM | POA: Insufficient documentation

## 2018-01-14 NOTE — Progress Notes (Signed)
Subjective:    Patient ID: Lucas Small, male    DOB: [birth year], 52 y.o.   MRN: [id number]  HPI  + ETG,ETS, Cocaine last UDS 11/07/2017 Patient states that cocaine use was associated with his former girlfriend and that he does not have any long-term issues with this.  He does admit to history of alcohol abuse.  In addition he states that he is no longer using any substances, "on the wagon". We discussed from a procedural standpoint that he may benefit from left L3-L4 medial branch and left L5 dorsal ramus radiofrequency neurotomy. He states that he was advised by his primary care physician not to undergo this at this clinic since they are looking at perhaps switching to another clinic. Reviewed patient out reach telephone call 12/11/2017, at that time he declined referral to substance abuse counseling center. No new medical issues in the interval time. Social patient states he was incarcerated for 1 month  Pain Inventory Average Pain 7 Pain Right Now 7 My pain is sharp, dull and stabbing  In the last 24 hours, has pain interfered with the following? General activity 5 Relation with others 5 Enjoyment of life 4 What TIME of day is your pain at its worst? daytime and evening Sleep (in general) Fair  Pain is worse with: walking, bending, sitting, standing and some activites Pain improves with: rest, heat/ice, medication and injections Relief from Meds: 7  Mobility how many minutes can you walk? 15 ability to climb steps?  yes do you drive?  yes  Function disabled: date disabled n/a  Neuro/Psych weakness numbness depression anxiety  Prior Studies Any changes since last visit?  no  Physicians involved in your care Any changes since last visit?  yes   Family History  Problem Relation Age of Onset  . Heart disease Mother   . Heart failure Mother   . Colon cancer Neg Hx   . Esophageal cancer Neg Hx   . Pancreatic cancer Neg Hx   . Stomach cancer Neg Hx   . Liver  cancer Neg Hx    Social History   Socioeconomic History  . Marital status: Divorced    Spouse name: Not on file  . Number of children: Not on file  . Years of education: Not on file  . Highest education level: Not on file  Occupational History    Comment: disabled  Social Needs  . Financial resource strain: Not on file  . Food insecurity:    Worry: Not on file    Inability: Not on file  . Transportation needs:    Medical: Not on file    Non-medical: Not on file  Tobacco Use  . Smoking status: Current Every Day Smoker    Packs/day: 1.50    Years: 28.00    Pack years: 42.00    Types: Cigarettes  . Smokeless tobacco: Never Used  . Tobacco comment: Reports his quit date is tomorrow, 10/16/17 and has cut back to 5 a day.  Substance and Sexual Activity  . Alcohol use: Not Currently    Comment: Reports has not drunk anything in 2 months  . Drug use: No    Comment: THC cocaine  . Sexual activity: Not on file    Comment: Not asked  Lifestyle  . Physical activity:    Days per week: Not on file    Minutes per session: Not on file  . Stress: Not on file  Relationships  . Social connections:  Talks on phone: Not on file    Gets together: Not on file    Attends religious service: Not on file    Active member of club or organization: Not on file    Attends meetings of clubs or organizations: Not on file    Relationship status: Not on file  Other Topics Concern  . Not on file  Social History Narrative   Exercise---  Walking some --  Not enough   Past Surgical History:  Procedure Laterality Date  . ABDOMINAL SURGERY    . APPENDECTOMY    . CARDIAC SURGERY    . HERNIA REPAIR    . ILEOSTOMY    . PERIPHERALLY INSERTED CENTRAL CATHETER INSERTION     Past Medical History:  Diagnosis Date  . Alcoholic ([place])   . CHF (congestive heart failure) ([place])   . Depression   . Diabetes mellitus without complication (HCC)    metformin  . Drug-seeking behavior   . Dyspnea    with  exertion .  Marland Kitchen Dysrhythmia    while hospitalized in [place]  . Gastric rupture   . History of kidney stones   . Hypertension   . MRSA (methicillin resistant Staphylococcus aureus)   . Multiple myeloma ([place]) 1990's  . Perforation bowel ([place])   . PNA (pneumonia)   . Renal disorder   . Renal failure   . Renal insufficiency   . Respiratory failure ([place])   . Sickle cell anemia (HCC)   . Sleep apnea    no CPAP machine   BP 128/84   Pulse 78   Ht '5\' 9"'$  (1.753 m)   Wt 183 lb 9.6 oz (83.3 kg)   SpO2 95%   BMI 27.11 kg/m   Opioid Risk Score:   Fall Risk Score:  `1  Depression screen PHQ 2/9  Depression screen [facility] 2/9 01/14/2018 12/11/2017 12/10/2017 10/03/2017 08/15/2017 02/13/2016 02/13/2016  Decreased Interest 1 0 0 2 2 0 0  Down, Depressed, Hopeless '1 1 1 2 2 '$ 0 0  PHQ - 2 Score '2 1 1 4 4 '$ 0 0  Altered sleeping - - - - 3 - -  Tired, decreased energy - - - - 2 - -  Change in appetite - - - - 1 - -  Feeling bad or failure about yourself  - - - - 1 - -  Trouble concentrating - - - - 2 - -  Moving slowly or fidgety/restless - - - - 3 - -  Suicidal thoughts - - - - 0 - -  PHQ-9 Score - - - - 16 - -  Difficult doing work/chores - - - - Somewhat difficult - -     Review of Systems  Constitutional: Positive for diaphoresis and unexpected weight change.  HENT: Negative.   Eyes: Negative.   Respiratory: Positive for apnea and shortness of breath.   Cardiovascular: Negative.   Gastrointestinal: Positive for abdominal pain, constipation, diarrhea and nausea.  Endocrine: Negative.   Genitourinary: Negative.   Musculoskeletal: Negative.   Skin: Negative.   Allergic/Immunologic: Negative.   Neurological: Negative.   Hematological: Negative.   Psychiatric/Behavioral: Negative.   All other systems reviewed and are negative.      Objective:   Physical Exam  Constitutional: He is oriented to person, place, and time. He appears well-developed and well-nourished.  HENT:  Head:  Normocephalic and atraumatic.  Eyes: Pupils are equal, round, and reactive to light. EOM are normal.  Musculoskeletal:  Mild tenderness to  palpation left lumbar paraspinals. Lumbar range of motion is 75% flexion extension 50 front lateral bending and rotation.  Neurological: He is alert and oriented to person, place, and time. Coordination and gait normal.  Motor strength is 5/5 bilateral hip flexor knee extensor ankle dorsiflexor Negative straight leg raising  Nursing note and vitals reviewed.  Sensation reported as equal to light touch bilateral lower limbs  Diminished Right achilles reflex     Assessment & Plan:  #1.  Lumbar spondylosis without myelopathy in addition patient has a history of chronic right S1 radiculopathy but no significant right lower extremity pain, mainly loss of reflex  We discussed treatment options this include lumbar radiofrequency left L3-L4 medial branches and L5 dorsal ramus  We discussed the medication management.  He has a history of substance abuse and using [facility] guidlines for high risk patient would not exceed tramadol dose of 50 mg twice daily We discussed that even to get a prescription for tramadol he would need a urine drug screen that shows no evidence of current alcohol use or current substance abuse. Patient will follow-up in 6 months with nurse practitioner unless he wishes to undergo radiofrequency neurotomy in which case he can be scheduled to see me at next available appointment

## 2018-01-14 NOTE — Patient Instructions (Signed)
Will recheck urine screen today , if no substances detected could start low dose tramadol 50mg  BID  Other treatment option if urine screen is positive for substances would be lumbar radiofrequency neurotomy

## 2018-01-15 ENCOUNTER — Ambulatory Visit (HOSPITAL_COMMUNITY): Payer: Self-pay | Admitting: Psychiatry

## 2018-01-20 LAB — TOXASSURE SELECT,+ANTIDEPR,UR

## 2018-01-21 ENCOUNTER — Telehealth: Payer: Self-pay | Admitting: *Deleted

## 2018-01-21 NOTE — Telephone Encounter (Signed)
Inform pt of non narcotic treatment  He can return to clinic once he completes an alcohol treatment program

## 2018-01-21 NOTE — Telephone Encounter (Signed)
Urine drug screen is positive for alcohol a second time.

## 2018-01-22 NOTE — Telephone Encounter (Signed)
No VM set up on Lucas Small's phone. I will send letter with information through My Chart which is active and mail letter with a [place] pamphlet.

## 2018-01-22 NOTE — Telephone Encounter (Signed)
Mr Schoeneck called back.  I have given him the information from Dr Letta Pate and informed him I sent a letter through [facility] that has the ph # for the [place] and we will mail letter with panmphlet. He may return to the clinic after completing treatment if he chooses. His appt with eunice in February has been cancelled.

## 2018-02-04 ENCOUNTER — Telehealth: Payer: Self-pay | Admitting: Family Medicine
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
## 2018-02-04 NOTE — Telephone Encounter (Signed)
Meter on [employer].  Patient has appt on Friday and he will pickup then.

## 2018-02-05 ENCOUNTER — Encounter (HOSPITAL_COMMUNITY): Payer: Self-pay | Admitting: Licensed Clinical Social Worker

## 2018-02-05 ENCOUNTER — Ambulatory Visit (HOSPITAL_COMMUNITY): Payer: PPO | Admitting: Licensed Clinical Social Worker

## 2018-02-05 DIAGNOSIS — F3131 Bipolar disorder, current episode depressed, mild: Secondary | ICD-10-CM

## 2018-02-05 DIAGNOSIS — F1020 Alcohol dependence, uncomplicated: Secondary | ICD-10-CM
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
## 2018-02-07 ENCOUNTER — Encounter: Payer: Self-pay | Admitting: Family Medicine

## 2018-02-07 ENCOUNTER — Ambulatory Visit (INDEPENDENT_AMBULATORY_CARE_PROVIDER_SITE_OTHER): Payer: PPO | Admitting: Family Medicine

## 2018-02-07 VITALS — BP 117/84 | HR 85 | Temp 98.7°F | Resp 16 | Ht 69.0 in | Wt 177.8 lb

## 2018-02-07 DIAGNOSIS — M545 Low back pain: Secondary | ICD-10-CM | POA: Diagnosis not present

## 2018-02-07 DIAGNOSIS — F1010 Alcohol abuse, uncomplicated: Secondary | ICD-10-CM | POA: Diagnosis not present

## 2018-02-07 DIAGNOSIS — G894 Chronic pain syndrome: Secondary | ICD-10-CM | POA: Diagnosis not present

## 2018-02-07 DIAGNOSIS — E1151 Type 2 diabetes mellitus with diabetic peripheral angiopathy without gangrene: Secondary | ICD-10-CM

## 2018-02-07 DIAGNOSIS — F1014 Alcohol abuse with alcohol-induced mood disorder: Secondary | ICD-10-CM | POA: Diagnosis not present

## 2018-02-07 DIAGNOSIS — G8929 Other chronic pain: Secondary | ICD-10-CM

## 2018-02-07 MED ORDER — GLUCOSE BLOOD VI STRP: ORAL_STRIP | 1 refills | Status: DC

## 2018-02-07 MED ORDER — ONETOUCH DELICA LANCETS FINE MISC: 0 refills | Status: DC

## 2018-02-07 NOTE — Progress Notes (Signed)
Patient ID: Lucas Small, male    DOB: [birth year]  Age: 52 y.o. MRN: [id number]    Subjective:  Subjective  HPI Lucas Small presents for f/u pain clinic.  He was d/c from clinic due to + alcohol on uds.  He was told he could go through a [facility] and come back but he does not want to go back there.    Review of Systems  Constitutional: Negative for chills and fever.  HENT: Negative for congestion and hearing loss.   Eyes: Negative for discharge.  Respiratory: Negative for cough and shortness of breath.   Cardiovascular: Negative for chest pain, palpitations and leg swelling.  Gastrointestinal: Negative for abdominal pain, blood in stool, constipation, diarrhea, nausea and vomiting.  Genitourinary: Negative for dysuria, frequency, hematuria and urgency.  Musculoskeletal: Positive for back pain. Negative for myalgias.  Skin: Negative for rash.  Allergic/Immunologic: Negative for environmental allergies.  Neurological: Negative for dizziness, weakness and headaches.  Hematological: Does not bruise/bleed easily.  Psychiatric/Behavioral: Negative for suicidal ideas. The patient is not nervous/anxious.     History Past Medical History:  Diagnosis Date  . Alcoholic ([place])   . CHF (congestive heart failure) ([place])   . Depression   . Diabetes mellitus without complication (HCC)    metformin  . Drug-seeking behavior   . Dyspnea    with exertion .  Marland Kitchen Dysrhythmia    while hospitalized in [place]  . Gastric rupture   . History of kidney stones   . Hypertension   . MRSA (methicillin resistant Staphylococcus aureus)   . Multiple myeloma ([place]) 1990's  . Perforation bowel ([place])   . PNA (pneumonia)   . Renal disorder   . Renal failure   . Renal insufficiency   . Respiratory failure ([place])   . Sickle cell anemia (HCC)   . Sleep apnea    no CPAP machine    He has a past surgical history that includes Abdominal surgery; Cardiac surgery; Peripherally inserted  central catheter insertion; Ileostomy; Hernia repair; and Appendectomy.   His family history includes Heart disease in his mother; Heart failure in his mother.He reports that he has been smoking cigarettes. He has a 42.00 pack-year smoking history. He has never used smokeless tobacco. He reports that he drank alcohol. He reports that he does not use drugs.  Current Outpatient Medications on File Prior to Visit  Medication Sig Dispense Refill  . ARIPiprazole (ABILIFY) 15 MG tablet Take 15 mg by mouth every evening.  0  . cyclobenzaprine (FLEXERIL) 10 MG tablet Take 1 tablet (10 mg total) by mouth at bedtime. 30 tablet 5  . DULoxetine (CYMBALTA) 60 MG capsule Take 1 capsule (60 mg total) by mouth daily. 90 capsule 0  . ezetimibe (ZETIA) 10 MG tablet Take 1 tablet (10 mg total) by mouth daily. 90 tablet 1  . fenofibrate 160 MG tablet Take 1 tablet (160 mg total) by mouth daily. 90 tablet 1  . gabapentin (NEURONTIN) 300 MG capsule Take 1 capsule (300 mg total) by mouth 3 (three) times daily. 90 capsule 1  . ibuprofen (ADVIL,MOTRIN) 400 MG tablet Take 400 mg by mouth 2 (two) times daily.  0  . lamoTRIgine (LAMICTAL) 150 MG tablet Take 1 tablet (150 mg total) by mouth daily. 90 tablet 0  . metFORMIN (GLUCOPHAGE) 1000 MG tablet Take 1 tablet (1,000 mg total) by mouth 2 (two) times daily with a meal. 180 tablet 0  . metoprolol succinate (TOPROL-XL) 25 MG  24 hr tablet Take 1 tablet (25 mg total) by mouth daily. 90 tablet 3  . mirtazapine (REMERON) 15 MG tablet Take 0.5 tablets by mouth at bedtime.  0  . ondansetron (ZOFRAN) 8 MG tablet Take 1 tablet (8 mg total) by mouth every 8 (eight) hours as needed for nausea or vomiting. 30 tablet 0  . pantoprazole (PROTONIX) 40 MG tablet Take 1 tablet (40 mg total) by mouth daily. 90 tablet 3  . rosuvastatin (CRESTOR) 40 MG tablet Take 1 tablet (40 mg total) by mouth daily. 90 tablet 1  . sitaGLIPtin (JANUVIA) 100 MG tablet Take 1 tablet (100 mg total) by mouth  daily. 90 tablet 1  . traZODone (DESYREL) 150 MG tablet Take 1 tablet (150 mg total) by mouth at bedtime as needed for sleep. 90 tablet 0  . varenicline (CHANTIX STARTING MONTH PAK) 0.5 MG X 11 & 1 MG X 42 tablet Take one 0.5 mg tablet by mouth once daily for 3 days, then increase to one 0.5 mg tablet twice daily for 4 days, then increase to one 1 mg tablet twice daily. 53 tablet 0   No current facility-administered medications on file prior to visit.      Objective:  Objective  Physical Exam  Constitutional: He is oriented to person, place, and time. Vital signs are normal. He appears well-developed and well-nourished. He is sleeping.  HENT:  Head: Normocephalic and atraumatic.  Mouth/Throat: Oropharynx is clear and moist.  Eyes: Pupils are equal, round, and reactive to light. EOM are normal.  Neck: Normal range of motion. Neck supple. No thyromegaly present.  Cardiovascular: Normal rate and regular rhythm.  No murmur heard. Pulmonary/Chest: Effort normal and breath sounds normal. No respiratory distress. He has no wheezes. He has no rales. He exhibits no tenderness.  Musculoskeletal: He exhibits tenderness. He exhibits no edema.  Neurological: He is alert and oriented to person, place, and time.  Skin: Skin is warm and dry.  Psychiatric: He has a normal mood and affect. His behavior is normal. Judgment and thought content normal.  Nursing note and vitals reviewed.  BP 117/84 (BP Location: Left Arm, Patient Position: Sitting, Cuff Size: Small)   Pulse 85   Temp 98.7 F (37.1 C) (Oral)   Resp 16   Ht _0  (1.753 m)   Wt 177 lb 12.8 oz (80.6 kg)   SpO2 99%   BMI 26.26 kg/m  Wt Readings from Last 3 Encounters:  02/07/18 177 lb 12.8 oz (80.6 kg)  01/14/18 183 lb 9.6 oz (83.3 kg)  12/10/17 183 lb 12.8 oz (83.4 kg)     Lab Results  Component Value Date   WBC 10.1 11/10/2017   HGB 17.2 (H) 11/10/2017   HCT 50.9 11/10/2017   PLT 275 11/10/2017   GLUCOSE 170 (H) 12/10/2017    CHOL 138 12/10/2017   TRIG 154.0 (H) 12/10/2017   HDL 51.00 12/10/2017   LDLDIRECT 156.0 05/09/2017   LDLCALC 56 12/10/2017   ALT 19 12/10/2017   AST 14 12/10/2017   NA 136 12/10/2017   K 4.7 12/10/2017   CL 100 12/10/2017   CREATININE 0.83 12/10/2017   BUN 12 12/10/2017   CO2 32 12/10/2017   TSH 1.59 02/13/2016   INR 1.0 09/04/2017   HGBA1C 6.6 (H) 12/10/2017   MICROALBUR 2.5 (H) 02/13/2016    Dg Abd Acute W/chest  Result Date: 11/10/2017 CLINICAL DATA:  Right upper quadrant and lower quadrant abdominal pain EXAM: DG ABDOMEN ACUTE W/ 1V  CHEST COMPARISON:  10/15/2016 FINDINGS: Single-view chest demonstrates no acute opacity or pleural effusion. Normal heart size. No pneumothorax. Aortic atherosclerosis. Supine and upright views of the abdomen demonstrate no free air beneath the diaphragm. Mild diffuse increased bowel gas with gas present in the colon and rectum. Postsurgical changes in the pelvis. No radiopaque calculi. IMPRESSION: 1. No radiographic evidence for acute cardiopulmonary abnormality. 2. Mild diffuse increased bowel gas without obstructive pattern, question mild ileus Electronically Signed   By: Donavan Foil M.D.   On: 11/10/2017 01:39     Assessment & Plan:  Plan  I am having [name] maintain his rosuvastatin, ezetimibe, sitaGLIPtin, varenicline, pantoprazole, cyclobenzaprine, ondansetron, lamoTRIgine, DULoxetine, traZODone, metoprolol succinate, mirtazapine, ibuprofen, ARIPiprazole, fenofibrate, gabapentin, metFORMIN, ONETOUCH DELICA LANCETS FINE, and glucose blood.  Meds ordered this encounter  Medications  . ONETOUCH DELICA LANCETS FINE MISC    Sig: USE AS DIRECTED TO CHECK BLOOD SUGAR TWICE DAILY    Dispense:  100 each    Refill:  0  . glucose blood (ONETOUCH VERIO) test strip    Sig: USE TWICE DAILY AS DIRECTED    Dispense:  100 each    Refill:  1    Problem List Items Addressed This Visit      Unprioritized   Alcohol abuse with alcohol-induced  mood disorder ([place])    D/w pt importance of quitting drinking + Alc on breath Also recommended he go to [facility] for dwi class      Back pain    Chronic pain New referral put in      DM (diabetes mellitus) type II, controlled, with peripheral vascular disorder ([place])    Pt given new glucometer       Other Visit Diagnoses    Chronic pain syndrome    -  Primary   Relevant Orders   Ambulatory referral to Pain Clinic   Alcohol abuse          Follow-up: Return in about 3 months (around 05/09/2018) for hypertension, hyperlipidemia, diabetes II.  Ann Held, DO

## 2018-02-07 NOTE — Patient Instructions (Signed)

## 2018-02-08 NOTE — Assessment & Plan Note (Signed)
Chronic pain New referral put in

## 2018-02-08 NOTE — Assessment & Plan Note (Signed)
D/w pt importance of quitting drinking + Alc on breath Also recommended he go to [facility] for dwi class

## 2018-02-08 NOTE — Assessment & Plan Note (Signed)
Pt given new glucometer

## 2018-02-10 ENCOUNTER — Other Ambulatory Visit (HOSPITAL_COMMUNITY): Payer: Self-pay | Admitting: Psychiatry

## 2018-02-18 DIAGNOSIS — M129 Arthropathy, unspecified: Secondary | ICD-10-CM | POA: Diagnosis not present

## 2018-02-18 DIAGNOSIS — M545 Low back pain: Secondary | ICD-10-CM | POA: Diagnosis not present

## 2018-02-18 DIAGNOSIS — F17200 Nicotine dependence, unspecified, uncomplicated: Secondary | ICD-10-CM | POA: Diagnosis not present

## 2018-02-18 DIAGNOSIS — Z79899 Other long term (current) drug therapy: Secondary | ICD-10-CM | POA: Diagnosis not present

## 2018-02-18 DIAGNOSIS — R109 Unspecified abdominal pain: Secondary | ICD-10-CM | POA: Diagnosis not present

## 2018-02-25 DIAGNOSIS — R209 Unspecified disturbances of skin sensation: Secondary | ICD-10-CM | POA: Diagnosis not present

## 2018-02-25 DIAGNOSIS — M545 Low back pain: Secondary | ICD-10-CM | POA: Diagnosis not present

## 2018-02-25 DIAGNOSIS — R1084 Generalized abdominal pain: Secondary | ICD-10-CM | POA: Diagnosis not present

## 2018-02-25 DIAGNOSIS — Z79899 Other long term (current) drug therapy: Secondary | ICD-10-CM | POA: Diagnosis not present

## 2018-03-06 ENCOUNTER — Encounter (HOSPITAL_COMMUNITY): Payer: Self-pay | Admitting: Psychiatry

## 2018-03-06 ENCOUNTER — Ambulatory Visit (INDEPENDENT_AMBULATORY_CARE_PROVIDER_SITE_OTHER): Payer: PPO | Admitting: Psychiatry

## 2018-03-06 VITALS — BP 122/68 | Ht 69.0 in | Wt 181.0 lb

## 2018-03-06 DIAGNOSIS — F3131 Bipolar disorder, current episode depressed, mild: Secondary | ICD-10-CM | POA: Diagnosis not present

## 2018-03-06 DIAGNOSIS — F1010 Alcohol abuse, uncomplicated: Secondary | ICD-10-CM | POA: Diagnosis not present

## 2018-03-06 DIAGNOSIS — F1490 Cocaine use, unspecified, uncomplicated: Secondary | ICD-10-CM | POA: Diagnosis not present

## 2018-03-06 DIAGNOSIS — R209 Unspecified disturbances of skin sensation: Secondary | ICD-10-CM | POA: Diagnosis not present

## 2018-03-06 DIAGNOSIS — R1084 Generalized abdominal pain: Secondary | ICD-10-CM | POA: Diagnosis not present

## 2018-03-06 MED ORDER — TRAZODONE HCL 150 MG PO TABS: 150.0000 mg | ORAL_TABLET | Freq: Every evening | ORAL | 0 refills | Status: DC | PRN

## 2018-03-06 MED ORDER — LAMOTRIGINE 150 MG PO TABS: 150.0000 mg | ORAL_TABLET | Freq: Every day | ORAL | 0 refills | Status: DC

## 2018-03-06 MED ORDER — DULOXETINE HCL 60 MG PO CPEP: 60.0000 mg | ORAL_CAPSULE | Freq: Every day | ORAL | 0 refills | Status: DC
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
## 2018-03-20 DIAGNOSIS — M545 Low back pain: Secondary | ICD-10-CM | POA: Diagnosis not present

## 2018-03-20 DIAGNOSIS — F17200 Nicotine dependence, unspecified, uncomplicated: Secondary | ICD-10-CM | POA: Diagnosis not present

## 2018-03-20 DIAGNOSIS — Z87891 Personal history of nicotine dependence: Secondary | ICD-10-CM | POA: Diagnosis not present

## 2018-03-20 DIAGNOSIS — Z79899 Other long term (current) drug therapy: Secondary | ICD-10-CM | POA: Diagnosis not present

## 2018-03-20 DIAGNOSIS — M47817 Spondylosis without myelopathy or radiculopathy, lumbosacral region: Secondary | ICD-10-CM | POA: Diagnosis not present

## 2018-03-20 DIAGNOSIS — R209 Unspecified disturbances of skin sensation: Secondary | ICD-10-CM | POA: Diagnosis not present

## 2018-03-26 ENCOUNTER — Other Ambulatory Visit: Payer: Self-pay | Admitting: Family Medicine

## 2018-04-02 DIAGNOSIS — N200 Calculus of kidney: Secondary | ICD-10-CM | POA: Diagnosis not present

## 2018-04-15 ENCOUNTER — Ambulatory Visit (INDEPENDENT_AMBULATORY_CARE_PROVIDER_SITE_OTHER): Payer: PPO | Admitting: Licensed Clinical Social Worker

## 2018-04-15 ENCOUNTER — Encounter (HOSPITAL_COMMUNITY): Payer: Self-pay | Admitting: Licensed Clinical Social Worker

## 2018-04-15 DIAGNOSIS — F1010 Alcohol abuse, uncomplicated: Secondary | ICD-10-CM | POA: Diagnosis not present

## 2018-04-15 DIAGNOSIS — F3131 Bipolar disorder, current episode depressed, mild: Secondary | ICD-10-CM | POA: Diagnosis not present

## 2018-04-15 NOTE — Progress Notes (Signed)
   THERAPIST PROGRESS NOTE  Session Time: 11-12  Participation Level: Active  Behavioral Response: Casual and Fairly GroomedAlertDepressed  Type of Therapy: Individual Therapy  Treatment Goals addressed: Coping  Interventions: CBT  Summary: Lucas Small is a 52 y.o. male who presents with Mood disorder and hx of Alcohol Use Disorder.  Pt is quiet and engaged in session. He is less agitated though he complains of pain in his back that causes him to "need to move around some". Pt reports he has taken his medications as px "about 2-3 days" in the past month. He complains that the medication makes him less energetic and he can not accomplish as much. Pt discuses his ongoing medical issues including "onset COPD and kidney stones". Pt wants to discuss how he can get over his girlfriend leaving him and wants to focus on taking his medications more regularly. Pt reports he is worried about an upcoming court date in which his disability will be re-evaluated. He states if he gets denied disability he may become suicidal. Counselor spent time asking about pt's social connections and his friendships that are supportive of him.   Suicidal/Homicidal: Yeswithout intent/plan  Therapist Response: Pt appeared despondent and slightly tearful, while more relaxed than previous sessions. He displays strong ambivalence about taking his medication and attending therapy though he admits both are "good for him". Pt discuss his longing for a romantic relationship and how he "just wants to be loved". Counselor spent time asking pt how he shows love to himself.   Plan: Return again in 4 weeks.  Diagnosis:    ICD-10-CM   1. Bipolar affective disorder, currently depressed, mild ([place]) F31.31   2. ETOH abuse F10.10       Archie Balboa, LCAS-A 04/15/2018

## 2018-04-17 ENCOUNTER — Encounter (HOSPITAL_COMMUNITY): Payer: Self-pay | Admitting: Psychiatry

## 2018-04-17 ENCOUNTER — Ambulatory Visit (INDEPENDENT_AMBULATORY_CARE_PROVIDER_SITE_OTHER): Payer: PPO

## 2018-04-17 ENCOUNTER — Ambulatory Visit (HOSPITAL_COMMUNITY): Payer: PPO | Admitting: Psychiatry

## 2018-04-17 VITALS — BP 128/74 | Ht 68.0 in | Wt 182.0 lb

## 2018-04-17 DIAGNOSIS — F332 Major depressive disorder, recurrent severe without psychotic features: Secondary | ICD-10-CM

## 2018-04-17 DIAGNOSIS — F1490 Cocaine use, unspecified, uncomplicated: Secondary | ICD-10-CM

## 2018-04-17 DIAGNOSIS — F1010 Alcohol abuse, uncomplicated: Secondary | ICD-10-CM | POA: Diagnosis not present

## 2018-04-17 DIAGNOSIS — F3131 Bipolar disorder, current episode depressed, mild: Secondary | ICD-10-CM | POA: Diagnosis not present

## 2018-04-17 MED ORDER — LAMOTRIGINE 200 MG PO TABS: 200.0000 mg | ORAL_TABLET | Freq: Every day | ORAL | 0 refills | Status: DC

## 2018-04-17 MED ORDER — ARIPIPRAZOLE ER 400 MG IM PRSY
400.0000 mg | PREFILLED_SYRINGE | INTRAMUSCULAR | Status: DC
  Administered 2018-04-17: 400 mg via INTRAMUSCULAR
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
## 2018-04-17 NOTE — Progress Notes (Signed)
Patient was here to see Dr. Adele Schilder today and was started on Abilify Maintena for compliance. This Probation officer explained the medication, how it works and side effects. Patient had no questions at this time. Abilify Maintena 400 mg injection was prepared as ordered and administered in patients left upper outer gluteal region. Patient tolerated well and will be back in 28 days for his next injection. Patient agrees to call me with any questions or concerns.

## 2018-04-21 DIAGNOSIS — Z79899 Other long term (current) drug therapy: Secondary | ICD-10-CM | POA: Diagnosis not present

## 2018-04-21 DIAGNOSIS — Z87891 Personal history of nicotine dependence: Secondary | ICD-10-CM | POA: Diagnosis not present

## 2018-04-21 DIAGNOSIS — M47817 Spondylosis without myelopathy or radiculopathy, lumbosacral region: Secondary | ICD-10-CM | POA: Diagnosis not present

## 2018-05-01 ENCOUNTER — Telehealth: Payer: Self-pay | Admitting: *Deleted

## 2018-05-01 NOTE — Telephone Encounter (Signed)
Received request for Medical Records from [facility] ; forwarded to Medical Records via email/scan/SLS 12/05

## 2018-05-05 ENCOUNTER — Ambulatory Visit: Payer: Self-pay | Admitting: Physical Medicine & Rehabilitation

## 2018-05-07 ENCOUNTER — Encounter: Payer: Self-pay | Admitting: Gastroenterology

## 2018-05-07 ENCOUNTER — Ambulatory Visit (INDEPENDENT_AMBULATORY_CARE_PROVIDER_SITE_OTHER): Payer: PPO | Admitting: Gastroenterology

## 2018-05-07 ENCOUNTER — Other Ambulatory Visit (INDEPENDENT_AMBULATORY_CARE_PROVIDER_SITE_OTHER): Payer: PPO

## 2018-05-07 VITALS — BP 134/82 | HR 72 | Ht 69.0 in | Wt 181.5 lb

## 2018-05-07 DIAGNOSIS — R109 Unspecified abdominal pain: Secondary | ICD-10-CM

## 2018-05-07 LAB — CBC WITH DIFFERENTIAL/PLATELET
Basophils Absolute: 0.1 10*3/uL (ref 0.0–0.1)
Basophils Relative: 1 % (ref 0.0–3.0)
EOS ABS: 0.2 10*3/uL (ref 0.0–0.7)
EOS PCT: 2.5 % (ref 0.0–5.0)
HEMATOCRIT: 50.3 % (ref 39.0–52.0)
Hemoglobin: 17.8 g/dL — ABNORMAL HIGH (ref 13.0–17.0)
Lymphocytes Relative: 16.2 % (ref 12.0–46.0)
Lymphs Abs: 1.4 10*3/uL (ref 0.7–4.0)
MCHC: 35.4 g/dL (ref 30.0–36.0)
MCV: 93.4 fl (ref 78.0–100.0)
MONO ABS: 0.8 10*3/uL (ref 0.1–1.0)
MONOS PCT: 9.5 % (ref 3.0–12.0)
NEUTROS ABS: 6.3 10*3/uL (ref 1.4–7.7)
NEUTROS PCT: 70.8 % (ref 43.0–77.0)
Platelets: 244 10*3/uL (ref 150.0–400.0)
RBC: 5.38 Mil/uL (ref 4.22–5.81)
RDW: 13.3 % (ref 11.5–15.5)
WBC: 8.8 10*3/uL (ref 4.0–10.5)

## 2018-05-07 LAB — COMPREHENSIVE METABOLIC PANEL
ALT: 16 U/L (ref 0–53)
AST: 17 U/L (ref 0–37)
Albumin: 4.5 g/dL (ref 3.5–5.2)
Alkaline Phosphatase: 81 U/L (ref 39–117)
BUN: 15 mg/dL (ref 6–23)
CO2: 26 meq/L (ref 19–32)
CREATININE: 1.02 mg/dL (ref 0.40–1.50)
Calcium: 9.8 mg/dL (ref 8.4–10.5)
Chloride: 103 mEq/L (ref 96–112)
GFR: 81.68 mL/min (ref >60.00)
GLUCOSE: 144 mg/dL — AB (ref 70–99)
Potassium: 4.1 mEq/L (ref 3.5–5.1)
Sodium: 136 mEq/L (ref 135–145)
Total Bilirubin: 0.7 mg/dL (ref 0.2–1.2)
Total Protein: 7.6 g/dL (ref 6.0–8.3)

## 2018-05-07 NOTE — Patient Instructions (Addendum)
You will be set up for a CT scan of abdomen and pelvis with IV and oral contrast. You will have labs checked today in the basement lab.  Please head down after you check out with the front desk  (cbc, cmet) You may need further testing (UGI with SBFT, colonoscopy possibly an EGD).  You have been scheduled for a CT scan of the abdomen and pelvis at [facility] ([id number]---this is in the same building as Press [facility]).   You are scheduled on 05/23/18 at 9. You should arrive 15 minutes prior to your appointment time for registration. Please follow the written instructions below on the day of your exam:  WARNING: IF YOU ARE ALLERGIC TO IODINE/X-RAY DYE, PLEASE NOTIFY RADIOLOGY IMMEDIATELY AT [id number]! YOU WILL BE GIVEN A 13 HOUR PREMEDICATION PREP.  1) Do not eat or drink anything after 5am (4 hours prior to your test) 2) You have been given 2 bottles of oral contrast to drink. The solution may taste better if refrigerated, but do NOT add ice or any other liquid to this solution. Shake well before drinking.    Drink 1 bottle of contrast @ 7am (2 hours prior to your exam)  Drink 1 bottle of contrast @ 8am (1 hour prior to your exam)  You may take any medications as prescribed with a small amount of water, if necessary. If you take any of the following medications: METFORMIN, GLUCOPHAGE, GLUCOVANCE, AVANDAMET, RIOMET, FORTAMET, Fabrica MET, JANUMET, GLUMETZA or METAGLIP, you MAY be asked to HOLD this medication 48 hours AFTER the exam.  The purpose of you drinking the oral contrast is to aid in the visualization of your intestinal tract. The contrast solution may cause some diarrhea. Depending on your individual set of symptoms, you may also receive an intravenous injection of x-ray contrast/dye. Plan on being at [facility] for 30 minutes or longer, depending on the type of exam you are having performed.  This test typically takes 30-45 minutes to complete.  If  you have any questions regarding your exam or if you need to reschedule, you may call the CT department at [phone] between the hours of 8:00 am and 5:00 pm, Monday-Friday.  Thank you for entrusting me with your care and choosing [facility].  Dr Ardis Hughs  ________________________________________________________________________

## 2018-05-07 NOTE — Progress Notes (Signed)
Review of pertinent gastrointestinal problems: 1. Complicated diverticulitis (elsewhere) resulting in numerous abdominal surgeries: In 2013 he had diveticulitis. Underwent an abdominal surgery ([place]) then devoloped septic shock, renal failure, lung issues. Sounds like a dehiscence.  Underwent other surgeries ([place]); he thinks he had intestinal surgery. Says he was in a coma for 7 weeks.  Had a temporary tracheostomy. Says he had a colostomy after he came out of the coma.  In 2014 he had an ileostomy at a third hospital (in [place]). He had takedown of the two ostomies (in [place], [place]). 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 [place] surgery in 2016 abdominal as well. Records requested however never received (as of 04/2018) 2. Abnormal pancreas (eleswhere): CT scan 07/2015: New area of low density within the pancreatic body and tail associated with peripancreatic stranding. The stranding also affects the adrenal gland and left renal pelvis. All of these findings may represent acute pancreatitis within area of necrosis in the body and tail as described above. There are several small adjacent lymph nodes. The focal low-density area measuring 1.9 cm may represent malignancy. Short-term follow-up imaging is recommended to ensure resolution of these findings. Pancreatic enzymes, LFTs 07/2015 were normal.   HPI: This is a pleasant 52-year-old man whom I last saw 8 months ago at the time of his initial office evaluation this past April.  He is following up now because he tells me he had a CT scan finally at a different hospital.  Has BMs, sometimes a bit constipated  Has some right sided groin pains.    Drinks at least 12 pack per week.  Completely quite cocaine "almost 2 months" ago.    Was in jail for assault for 2 months over the summer.  He tells me that he had a colonoscopy at [facility] within the past 2 or 3 years.  He does not recall who did it or why it was done.  I cannot  find any records of any such procedure in the past 5 to 10 years in [place]  I last saw him April 2019 which was his initial office visit.  His biggest issues then were spasms of his back and he asked for antispasmodic medicines which I declined.  Pyrosis was also a big issue.  I gave him a prescription for a proton pump inhibitor 1 pill once daily.  He was having bloating and intermittent loose stools I felt these could be from multiple etiologies given his vast history and I thought he would probably need testing for that.  I recommended starting with a CT scan abdomen pelvis to update especially since he had an abnormal pancreas on a 2017 CT scan.  He never had that done.  I am seeing him now for the first time since that visit.  Labs at the time of his April 2019 were essentially normal.  Normal CBC, normal complete metabolic profile, negative alcohol level.  He is here in follow-up today.  He ended up having a CT urogram done October 2019 at [facility].  I was able to view the report through epic.  No mention was made of abnormalities of his pancreas.  No significant findings essentially.  There was no IV or oral contrast from what I can tell.   Chief complaint is chronic abdominal pain  ROS: complete GI ROS as described in HPI, all other review negative.  Constitutional: He has lost about 20 pounds since his initial visit here 8 months ago.       Past Medical History:  Diagnosis Date  . Alcoholic (HCC)   . CHF (congestive heart failure) (HCC)   . Depression   . Diabetes mellitus without complication (HCC)    metformin  . Drug-seeking behavior   . Dyspnea    with exertion .  . Dysrhythmia    while hospitalized in [place]  . Gastric rupture   . History of kidney stones   . Hypertension   . MRSA (methicillin resistant Staphylococcus aureus)   . Multiple myeloma (HCC) 1990's  . Perforation bowel (HCC)   . PNA (pneumonia)   . Renal disorder   . Renal failure   . Renal  insufficiency   . Respiratory failure (HCC)   . Sickle cell anemia (HCC)   . Sleep apnea    no CPAP machine    Past Surgical History:  Procedure Laterality Date  . ABDOMINAL SURGERY    . APPENDECTOMY    . CARDIAC SURGERY    . HERNIA REPAIR    . ILEOSTOMY    . PERIPHERALLY INSERTED CENTRAL CATHETER INSERTION      Current Outpatient Medications  Medication Sig Dispense Refill  . ARIPiprazole (ABILIFY) 15 MG tablet Take 15 mg by mouth every evening.  0  . cyclobenzaprine (FLEXERIL) 10 MG tablet Take 1 tablet (10 mg total) by mouth at bedtime. 30 tablet 5  . DULoxetine (CYMBALTA) 60 MG capsule Take 1 capsule (60 mg total) by mouth daily. 90 capsule 0  . ezetimibe (ZETIA) 10 MG tablet Take 1 tablet (10 mg total) by mouth daily. 90 tablet 1  . fenofibrate 160 MG tablet Take 1 tablet (160 mg total) by mouth daily. 90 tablet 1  . gabapentin (NEURONTIN) 300 MG capsule Take 1 capsule (300 mg total) by mouth 3 (three) times daily. 90 capsule 1  . glucose blood (ONETOUCH VERIO) test strip USE TWICE DAILY AS DIRECTED 100 each 1  . ibuprofen (ADVIL,MOTRIN) 400 MG tablet Take 400 mg by mouth 2 (two) times daily.  0  . lamoTRIgine (LAMICTAL) 200 MG tablet Take 1 tablet (200 mg total) by mouth daily. 30 tablet 0  . Lancets (ONETOUCH DELICA PLUS LANCET30G) MISC USE AS DIRECTED TO CHECK BLOOD SUGAR TWICE DAILY 100 each 0  . metFORMIN (GLUCOPHAGE) 1000 MG tablet Take 1 tablet (1,000 mg total) by mouth 2 (two) times daily with a meal. 180 tablet 0  . metoprolol succinate (TOPROL-XL) 25 MG 24 hr tablet Take 1 tablet (25 mg total) by mouth daily. 90 tablet 3  . mirtazapine (REMERON) 15 MG tablet Take 0.5 tablets by mouth at bedtime.  0  . ondansetron (ZOFRAN) 8 MG tablet Take 1 tablet (8 mg total) by mouth every 8 (eight) hours as needed for nausea or vomiting. 30 tablet 0  . pantoprazole (PROTONIX) 40 MG tablet Take 1 tablet (40 mg total) by mouth daily. 90 tablet 3  . rosuvastatin (CRESTOR) 40 MG tablet  Take 1 tablet (40 mg total) by mouth daily. 90 tablet 1  . sitaGLIPtin (JANUVIA) 100 MG tablet Take 1 tablet (100 mg total) by mouth daily. 90 tablet 1  . traZODone (DESYREL) 150 MG tablet Take 1 tablet (150 mg total) by mouth at bedtime as needed for sleep. 90 tablet 0  . varenicline (CHANTIX STARTING MONTH PAK) 0.5 MG X 11 & 1 MG X 42 tablet Take one 0.5 mg tablet by mouth once daily for 3 days, then increase to one 0.5 mg tablet twice daily for 4 days, then increase to   one 1 mg tablet twice daily. 53 tablet 0   Current Facility-Administered Medications  Medication Dose Route Frequency Provider Last Rate Last Dose  . ARIPiprazole ER (ABILIFY MAINTENA) 400 MG prefilled syringe 400 mg  400 mg Intramuscular Q28 days Kathlee Nations, MD   400 mg at 04/17/18 1521    Allergies as of 05/07/2018 - Review Complete 05/07/2018  Allergen Reaction Noted  . Bee venom Anaphylaxis 09/26/2012    Family History  Problem Relation Age of Onset  . Heart disease Mother   . Heart failure Mother   . Colon cancer Neg Hx   . Esophageal cancer Neg Hx   . Pancreatic cancer Neg Hx   . Stomach cancer Neg Hx   . Liver cancer Neg Hx     Social History   Socioeconomic History  . Marital status: Divorced    Spouse name: Not on file  . Number of children: Not on file  . Years of education: Not on file  . Highest education level: Not on file  Occupational History    Comment: disabled  Social Needs  . Financial resource strain: Not on file  . Food insecurity:    Worry: Not on file    Inability: Not on file  . Transportation needs:    Medical: Not on file    Non-medical: Not on file  Tobacco Use  . Smoking status: Current Every Day Smoker    Packs/day: 1.50    Years: 28.00    Pack years: 42.00    Types: Cigarettes  . Smokeless tobacco: Never Used  . Tobacco comment: Reports his quit date is tomorrow, 10/16/17 and has cut back to 5 a day.  Substance and Sexual Activity  . Alcohol use: Not Currently     Comment: Reports has not drunk anything in 2 months  . Drug use: No    Comment: THC cocaine  . Sexual activity: Not on file    Comment: Not asked  Lifestyle  . Physical activity:    Days per week: Not on file    Minutes per session: Not on file  . Stress: Not on file  Relationships  . Social connections:    Talks on phone: Not on file    Gets together: Not on file    Attends religious service: Not on file    Active member of club or organization: Not on file    Attends meetings of clubs or organizations: Not on file    Relationship status: Not on file  . Intimate partner violence:    Fear of current or ex partner: Not on file    Emotionally abused: Not on file    Physically abused: Not on file    Forced sexual activity: Not on file  Other Topics Concern  . Not on file  Social History Narrative   Exercise---  Walking some --  Not enough     Physical Exam: BP 134/82   Pulse 72   Ht 5' 9" (1.753 m)   Wt 181 lb 8 oz (82.3 kg)   BMI 26.80 kg/m  Constitutional: generally well-appearing Psychiatric: alert and oriented x3 Abdomen: soft, nontender, nondistended, no obvious ascites, no peritoneal signs, normal bowel sounds No peripheral edema noted in lower extremities  Assessment and plan: 52 y.o. 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  Adhesive disease can cause a  variety of different pains.  He is not having anything that sounds like overt bowel obstruction fortunately.  He goes to a pain clinic already.  He admits to drinking about a 12 pack of beer weekly, I have suspicions that it is quite a bit more than that from the way he carries on.  He has been smoking cocaine up until about 2 months ago.  Once again I think it is important to get a good quality abdomen pelvic CT scan with IV and oral contrast.  He had abnormal area in his pancreas in 2017.  I told  him unfortunately the scan that he had at an outside hospital 2 months ago was a urologic CT scan and so no IV or oral contrast was given and so it gives very limited information about the gastrointestinal tract.  I would like to start with that again as the work-up hopefully he will do at this time.  He will get a basic set of labs again today including a CBC and complete metabolic profile.  Please see the "Patient Instructions" section for addition details about the plan.   , MD [facility] 05/07/2018, 9:36 AM    

## 2018-05-10 IMAGING — DX DG ABDOMEN 1V
1 series · 1 of 1 positions shown · non-contrast
Comparison: CT 03/10/2016.

CLINICAL DATA: Stone disease.  Preoperative study.

EXAM:
ABDOMEN - 1 VIEW

[abdomen kub]
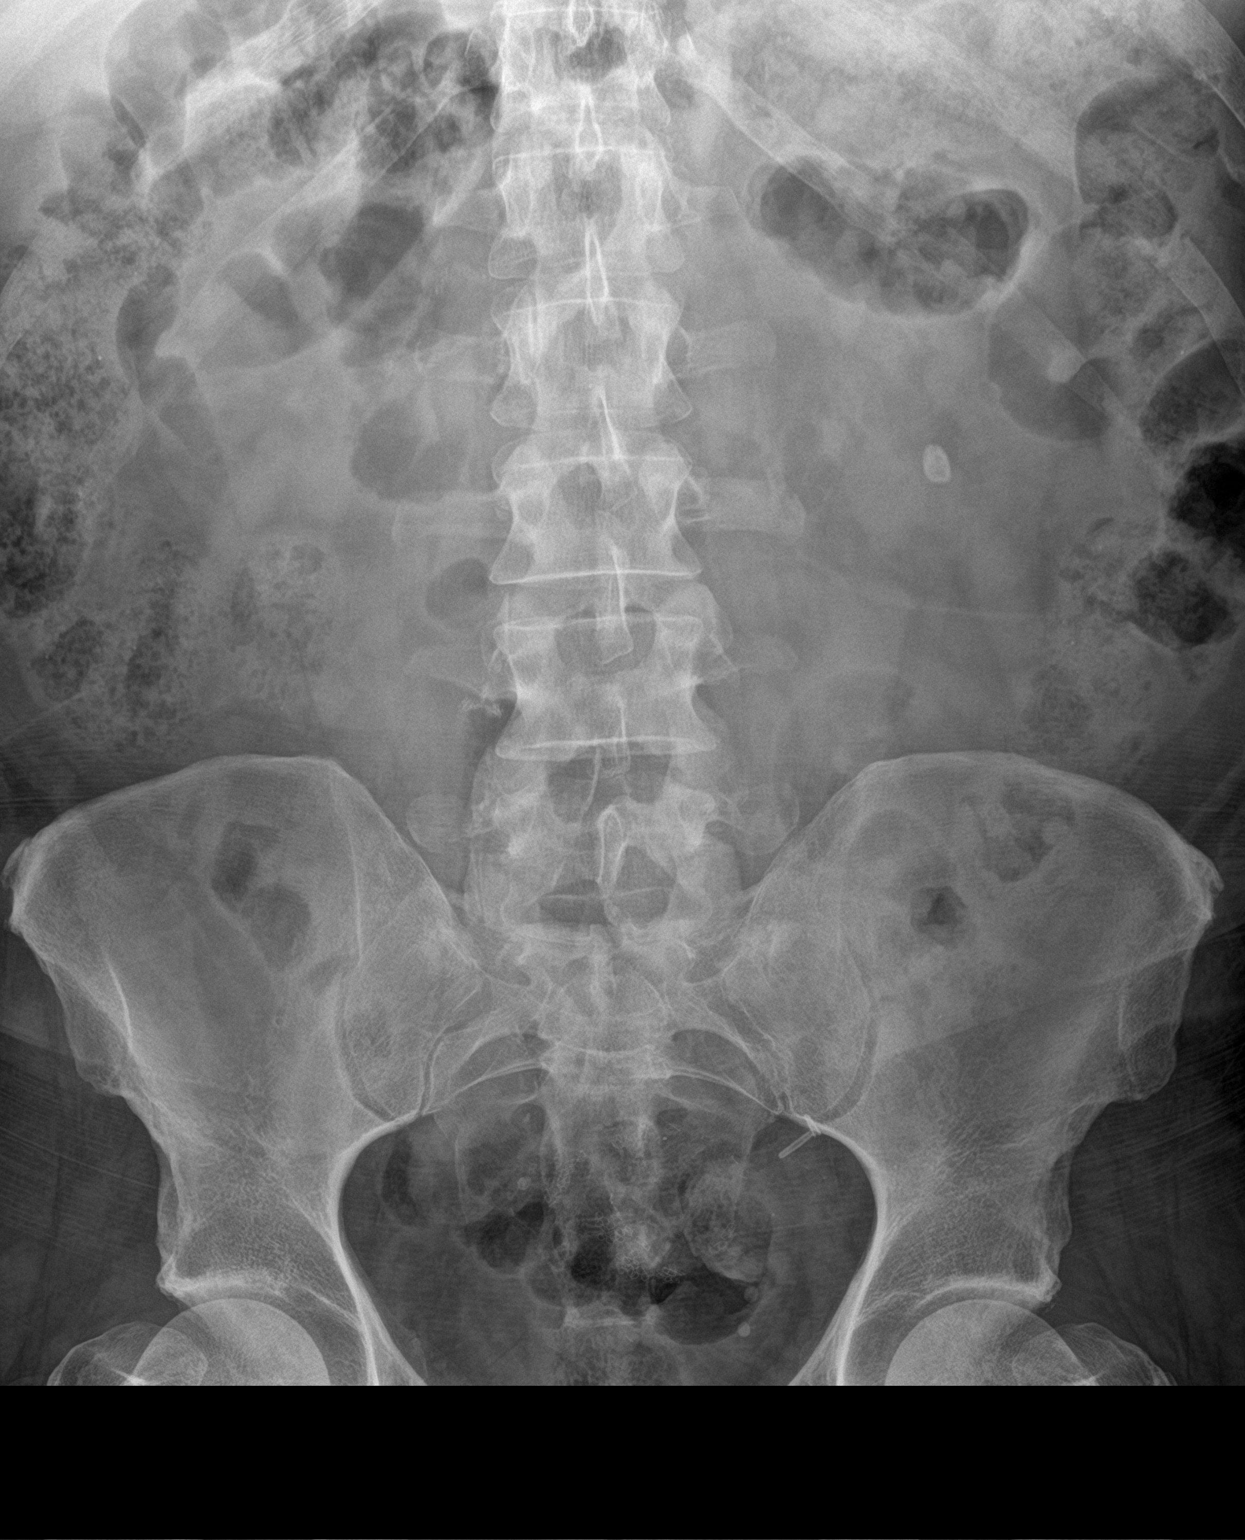

[1 of 1 positions shown; findings below may reference images not displayed]

FINDINGS: Surgical clips and sutures are noted in the abdomen. Soft tissue
structures are unremarkable. Previously identified left upper
ureteral stone appears to be projected over the left lower kidney on
today's exam. Stable pelvic calcifications consistent phleboliths.
Surgical clips sutures noted over the abdomen. No acute bony
abnormality .
IMPRESSION: Previously identified proximal left ureteral stone now appears to be
over the left lower renal pole. Exam otherwise unremarkable .

## 2018-05-13 ENCOUNTER — Ambulatory Visit (HOSPITAL_COMMUNITY): Payer: PPO | Admitting: Licensed Clinical Social Worker

## 2018-05-13 ENCOUNTER — Other Ambulatory Visit (HOSPITAL_COMMUNITY): Payer: Self-pay

## 2018-05-13 ENCOUNTER — Ambulatory Visit (HOSPITAL_COMMUNITY): Payer: PPO

## 2018-05-13 ENCOUNTER — Telehealth (HOSPITAL_COMMUNITY): Payer: Self-pay

## 2018-05-13 ENCOUNTER — Encounter (HOSPITAL_COMMUNITY): Payer: Self-pay

## 2018-05-13 MED ORDER — ARIPIPRAZOLE ER 400 MG IM PRSY: 400.0000 mg | PREFILLED_SYRINGE | INTRAMUSCULAR | 2 refills | Status: DC

## 2018-05-13 NOTE — Telephone Encounter (Signed)
Patient came in today to see Wes, I talked to him about his injection and patient states he has been doing really well taking his oral medication and would rather continue on the oral and not take the injection anymore.

## 2018-05-22 DIAGNOSIS — M47817 Spondylosis without myelopathy or radiculopathy, lumbosacral region: Secondary | ICD-10-CM | POA: Diagnosis not present

## 2018-05-22 DIAGNOSIS — Z87891 Personal history of nicotine dependence: Secondary | ICD-10-CM | POA: Diagnosis not present

## 2018-05-22 DIAGNOSIS — Z79899 Other long term (current) drug therapy: Secondary | ICD-10-CM | POA: Diagnosis not present

## 2018-05-23 ENCOUNTER — Ambulatory Visit (INDEPENDENT_AMBULATORY_CARE_PROVIDER_SITE_OTHER)
Admission: RE | Admit: 2018-05-23 | Discharge: 2018-05-23 | Disposition: A | Discharge status: Home / Self Care | Payer: PPO | Source: Ambulatory Visit | Attending: Gastroenterology | Admitting: Gastroenterology

## 2018-05-23 DIAGNOSIS — N132 Hydronephrosis with renal and ureteral calculous obstruction: Secondary | ICD-10-CM | POA: Diagnosis not present

## 2018-05-23 DIAGNOSIS — R109 Unspecified abdominal pain: Secondary | ICD-10-CM

## 2018-05-23 DIAGNOSIS — K869 Disease of pancreas, unspecified: Secondary | ICD-10-CM | POA: Diagnosis not present

## 2018-05-23 MED ORDER — IOPAMIDOL (ISOVUE-300) INJECTION 61%
100.0000 mL | Freq: Once | INTRAVENOUS | Status: AC | PRN
  Administered 2018-05-23: 100 mL via INTRAVENOUS

## 2018-05-30 ENCOUNTER — Telehealth: Payer: Self-pay | Admitting: *Deleted

## 2018-05-30 NOTE — Telephone Encounter (Signed)
Received request for Medical Records from [facility]; forwarded to Medical Records via email/scan/SLS

## 2018-06-03 ENCOUNTER — Ambulatory Visit (INDEPENDENT_AMBULATORY_CARE_PROVIDER_SITE_OTHER): Payer: PPO | Admitting: Licensed Clinical Social Worker

## 2018-06-03 ENCOUNTER — Encounter (HOSPITAL_COMMUNITY): Payer: Self-pay | Admitting: Licensed Clinical Social Worker

## 2018-06-03 DIAGNOSIS — F332 Major depressive disorder, recurrent severe without psychotic features: Secondary | ICD-10-CM | POA: Diagnosis not present

## 2018-06-03 DIAGNOSIS — F3131 Bipolar disorder, current episode depressed, mild: Secondary | ICD-10-CM

## 2018-06-03 NOTE — Progress Notes (Signed)
   THERAPIST PROGRESS NOTE  Session Time: 9-10  Participation Level: Active  Behavioral Response: Fairly GroomedAlertAnxious  Type of Therapy: Individual Therapy  Treatment Goals addressed: Anxiety  Interventions: CBT and Supportive  Summary: Lucas Small is a 53 y.o. male who presents with hx of Bipolar Depression and ongoing Substance abuse problems.  Pt is agitated and nervous in session. He makes good eye contact. He admits he is anxious about his upcoming court date on Jan 31. He states his mother has had a stroke and was in the hospital for some time during the Christmas holiday. Pt states this has increased his stress but also helped him stop focusing on himself and his own substance use. Pt denies any cocaine use in past 2 months. He states his drinking is "not a problem" currently. Pt is living w/ his ex wife so he can be close to his mother who lives in the same apartment complex.    Suicidal/Homicidal: Nowithout intent/plan  Therapist Response: Counselor used open questions, active listening, and goal-setting for 2020. Pt hopes to get disability approved and get his own apartment again. He also hopes to get a new girlfriend. Pt states he enjoys living alone more than anything else.   Plan: Return again in 4 weeks.  Diagnosis:    ICD-10-CM   1. 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 Bipolar affective disorder, currently depressed, mild ([place]) F31.31       Archie Balboa, LCAS-A 06/03/2018

## 2018-06-10 ENCOUNTER — Encounter

## 2018-06-10 ENCOUNTER — Ambulatory Visit (INDEPENDENT_AMBULATORY_CARE_PROVIDER_SITE_OTHER): Payer: PPO | Admitting: Psychiatry

## 2018-06-10 VITALS — BP 122/70 | Ht 69.0 in | Wt 181.8 lb

## 2018-06-10 DIAGNOSIS — F3131 Bipolar disorder, current episode depressed, mild: Secondary | ICD-10-CM

## 2018-06-10 DIAGNOSIS — F1020 Alcohol dependence, uncomplicated: Secondary | ICD-10-CM

## 2018-06-10 MED ORDER — LAMOTRIGINE 200 MG PO TABS: 200.0000 mg | ORAL_TABLET | Freq: Every day | ORAL | 0 refills | Status: DC

## 2018-06-10 MED ORDER — TRAZODONE HCL 150 MG PO TABS: 150.0000 mg | ORAL_TABLET | Freq: Every evening | ORAL | 0 refills | Status: DC | PRN

## 2018-06-10 MED ORDER — ARIPIPRAZOLE 15 MG PO TABS: 15.0000 mg | ORAL_TABLET | Freq: Every evening | ORAL | 0 refills | Status: DC
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
## 2018-06-20 DIAGNOSIS — M47817 Spondylosis without myelopathy or radiculopathy, lumbosacral region: Secondary | ICD-10-CM | POA: Diagnosis not present

## 2018-06-20 DIAGNOSIS — Z87891 Personal history of nicotine dependence: Secondary | ICD-10-CM | POA: Diagnosis not present

## 2018-06-20 DIAGNOSIS — Z79899 Other long term (current) drug therapy: Secondary | ICD-10-CM | POA: Diagnosis not present

## 2018-06-30 ENCOUNTER — Ambulatory Visit (INDEPENDENT_AMBULATORY_CARE_PROVIDER_SITE_OTHER): Payer: PPO | Admitting: Licensed Clinical Social Worker

## 2018-06-30 DIAGNOSIS — F3131 Bipolar disorder, current episode depressed, mild: Secondary | ICD-10-CM | POA: Diagnosis not present

## 2018-06-30 DIAGNOSIS — F1020 Alcohol dependence, uncomplicated: Secondary | ICD-10-CM | POA: Diagnosis not present

## 2018-07-01 ENCOUNTER — Encounter (HOSPITAL_COMMUNITY): Payer: Self-pay | Admitting: Licensed Clinical Social Worker

## 2018-07-01 NOTE — Progress Notes (Signed)
   THERAPIST PROGRESS NOTE  Session Time: 11-12  Participation Level: Active  Behavioral Response: Fairly GroomedAlertDepressed  Type of Therapy: Individual Therapy  Treatment Goals addressed: Coping  Interventions: CBT and Supportive  Summary: Lucas Small is a 53 y.o. male who presents with hx of Substance Use Disorder, Severe and Bipolar 1.  PT attended court on Jan 31 and the outcome is pending for 6 more weeks. PT is worried his disability may be taken away.  He states his mother's health continues to go down following her recent stroke. PT feels she "may not make it through the week." Counselor and PT discuss self care, grief, PT's alcohol consumption recently, and his belief that he "does not have a problem w/ it anymore".   Suicidal/Homicidal: Nowithout intent/plan  Therapist Response: Counselor used open questions, active listening, staying w/ emotion when PT became tearful, and challenging PT on his drinking bxs.   Plan: Return again in 4 weeks.  Diagnosis:    ICD-10-CM   1. Bipolar affective disorder, currently depressed, mild ([place]) F31.31   2. Alcohol use disorder, severe, dependence ([place]) F10.20        Archie Balboa, LCAS-A 07/01/2018

## 2018-07-17 ENCOUNTER — Ambulatory Visit: Payer: Self-pay | Admitting: Registered Nurse

## 2018-07-18 DIAGNOSIS — Z87891 Personal history of nicotine dependence: Secondary | ICD-10-CM | POA: Diagnosis not present

## 2018-07-18 DIAGNOSIS — M47817 Spondylosis without myelopathy or radiculopathy, lumbosacral region: Secondary | ICD-10-CM | POA: Diagnosis not present

## 2018-07-18 DIAGNOSIS — F17200 Nicotine dependence, unspecified, uncomplicated: Secondary | ICD-10-CM | POA: Diagnosis not present

## 2018-07-18 DIAGNOSIS — Z79899 Other long term (current) drug therapy: Secondary | ICD-10-CM | POA: Diagnosis not present

## 2018-07-24 ENCOUNTER — Ambulatory Visit: Payer: Self-pay | Admitting: *Deleted

## 2018-07-24 NOTE — Telephone Encounter (Signed)
Pt calling with complaints of right arm numbness and discomfort for the past couple of days. Pt states that he has noticed that his right arm does not move as quickly as his left arm. Pt states he has also experienced difficulty grasping objects. No other symptoms voiced at this time. Pt scheduled for appt with Dr. Carollee Herter and advised to return call to office with worsening symptoms before scheduled appt. Understanding verbalized.  Reason for Disposition . Numbness (i.e., loss of sensation) in hand or fingers  Answer Assessment - Initial Assessment Questions 1. ONSET: "When did the pain start?"     Past couple of days 2. LOCATION: "Where is the pain located?"     Right arm 3. PAIN: "How bad is the pain?" (Scale 1-10; or mild, moderate, severe)   - MILD (1-3): doesn't interfere with normal activities   - MODERATE (4-7): interferes with normal activities (e.g., work or school) or awakens from sleep   - SEVERE (8-10): excruciating pain, unable to do any normal activities, unable to hold a cup of water     A slight discomfort 4. WORK OR EXERCISE: "Has there been any recent work or exercise that involved this part of the body?"     No 5. CAUSE: "What do you think is causing the arm pain?"     unknown 6. OTHER SYMPTOMS: "Do you have any other symptoms?" (e.g., neck pain, swelling, rash, fever, numbness, weakness)     More numb than anything has difficult with dexterity and grasping  Protocols used: ARM PAIN-A-AH

## 2018-07-25 ENCOUNTER — Ambulatory Visit (INDEPENDENT_AMBULATORY_CARE_PROVIDER_SITE_OTHER): Payer: PPO | Admitting: Family Medicine

## 2018-07-25 ENCOUNTER — Encounter: Payer: Self-pay | Admitting: Family Medicine

## 2018-07-25 VITALS — BP 106/72 | HR 101 | Resp 12 | Ht 69.0 in | Wt 175.0 lb

## 2018-07-25 DIAGNOSIS — R200 Anesthesia of skin: Secondary | ICD-10-CM | POA: Diagnosis not present

## 2018-07-25 DIAGNOSIS — R202 Paresthesia of skin: Secondary | ICD-10-CM

## 2018-07-25 NOTE — Progress Notes (Signed)
Patient ID: Lucas Small, male    DOB: [birth year]  Age: 53 y.o. MRN: [id number]    Subjective:  Subjective  HPI Lucas Small presents for numbness in R hand x 6 days  He feels like it is not reacting fast enough and his whole hand is numb.  No known injury  He did mention his mother died 2 weeks ago.    Review of Systems  Constitutional: Negative for appetite change, chills, diaphoresis, fatigue, fever and unexpected weight change.  HENT: Negative for congestion and hearing loss.   Eyes: Negative for pain, discharge, redness and visual disturbance.  Respiratory: Negative for cough, chest tightness, shortness of breath and wheezing.   Cardiovascular: Negative for chest pain, palpitations and leg swelling.  Gastrointestinal: Negative for abdominal pain, blood in stool, constipation, diarrhea, nausea and vomiting.  Endocrine: Negative for cold intolerance, heat intolerance, polydipsia, polyphagia and polyuria.  Genitourinary: Negative for difficulty urinating, dysuria, frequency, hematuria and urgency.  Musculoskeletal: Negative for back pain and myalgias.  Skin: Negative for rash.  Allergic/Immunologic: Negative for environmental allergies.  Neurological: Negative for dizziness, weakness, light-headedness, numbness and headaches.  Hematological: Does not bruise/bleed easily.  Psychiatric/Behavioral: Negative for suicidal ideas. The patient is not nervous/anxious.     History Past Medical History:  Diagnosis Date  . Alcoholic ([place])   . CHF (congestive heart failure) ([place])   . Depression   . Diabetes mellitus without complication (HCC)    metformin  . Drug-seeking behavior   . Dyspnea    with exertion .  Marland Kitchen Dysrhythmia    while hospitalized in [place]  . Gastric rupture   . History of kidney stones   . Hypertension   . MRSA (methicillin resistant Staphylococcus aureus)   . Multiple myeloma ([place]) 1990's  . Perforation bowel ([place])   . PNA (pneumonia)   . Renal disorder     . Renal failure   . Renal insufficiency   . Respiratory failure ([place])   . Sickle cell anemia (HCC)   . Sleep apnea    no CPAP machine    He has a past surgical history that includes Abdominal surgery; Cardiac surgery; Peripherally inserted central catheter insertion; Ileostomy; Hernia repair; and Appendectomy.   His family history includes Heart disease in his mother; Heart failure in his mother.He reports that he has been smoking cigarettes. He has a 42.00 pack-year smoking history. He has never used smokeless tobacco. He reports previous alcohol use. He reports that he does not use drugs.  Current Outpatient Medications on File Prior to Visit  Medication Sig Dispense Refill  . ARIPiprazole (ABILIFY) 15 MG tablet Take 1 tablet (15 mg total) by mouth every evening. 90 tablet 0  . cyclobenzaprine (FLEXERIL) 10 MG tablet Take 1 tablet (10 mg total) by mouth at bedtime. 30 tablet 5  . ezetimibe (ZETIA) 10 MG tablet Take 1 tablet (10 mg total) by mouth daily. 90 tablet 1  . fenofibrate 160 MG tablet Take 1 tablet (160 mg total) by mouth daily. 90 tablet 1  . gabapentin (NEURONTIN) 300 MG capsule Take 1 capsule (300 mg total) by mouth 3 (three) times daily. 90 capsule 1  . glucose blood (ONETOUCH VERIO) test strip USE TWICE DAILY AS DIRECTED 100 each 1  . ibuprofen (ADVIL,MOTRIN) 400 MG tablet Take 400 mg by mouth 2 (two) times daily.  0  . lamoTRIgine (LAMICTAL) 200 MG tablet Take 1 tablet (200 mg total) by mouth daily. 90 tablet 0  . Lancets [facility]  DELICA PLUS [id number]) MISC USE AS DIRECTED TO CHECK BLOOD SUGAR TWICE DAILY 100 each 0  . metFORMIN (GLUCOPHAGE) 1000 MG tablet Take 1 tablet (1,000 mg total) by mouth 2 (two) times daily with a meal. 180 tablet 0  . metoprolol succinate (TOPROL-XL) 25 MG 24 hr tablet Take 1 tablet (25 mg total) by mouth daily. 90 tablet 3  . ondansetron (ZOFRAN) 8 MG tablet Take 1 tablet (8 mg total) by mouth every 8 (eight) hours as needed for nausea or  vomiting. 30 tablet 0  . pantoprazole (PROTONIX) 40 MG tablet Take 1 tablet (40 mg total) by mouth daily. 90 tablet 3  . rosuvastatin (CRESTOR) 40 MG tablet Take 1 tablet (40 mg total) by mouth daily. 90 tablet 1  . sitaGLIPtin (JANUVIA) 100 MG tablet Take 1 tablet (100 mg total) by mouth daily. 90 tablet 1  . traZODone (DESYREL) 150 MG tablet Take 1 tablet (150 mg total) by mouth at bedtime as needed for sleep. 90 tablet 0  . varenicline (CHANTIX STARTING MONTH PAK) 0.5 MG X 11 & 1 MG X 42 tablet Take one 0.5 mg tablet by mouth once daily for 3 days, then increase to one 0.5 mg tablet twice daily for 4 days, then increase to one 1 mg tablet twice daily. 53 tablet 0   No current facility-administered medications on file prior to visit.      Objective:  Objective  Physical Exam Vitals signs and nursing note reviewed.  Constitutional:      General: He is sleeping.     Appearance: He is well-developed.  HENT:     Head: Normocephalic and atraumatic.  Eyes:     Pupils: Pupils are equal, round, and reactive to light.  Neck:     Musculoskeletal: Normal range of motion and neck supple.     Thyroid: No thyromegaly.  Cardiovascular:     Rate and Rhythm: Normal rate and regular rhythm.     Heart sounds: No murmur.  Pulmonary:     Effort: Pulmonary effort is normal. No respiratory distress.     Breath sounds: Normal breath sounds. No wheezing or rales.  Chest:     Chest wall: No tenderness.  Musculoskeletal:        General: Tenderness present.     Right hand: He exhibits tenderness. He exhibits no bony tenderness, no deformity and no swelling. Decreased strength noted.       Hands:  Skin:    General: Skin is warm and dry.  Neurological:     Mental Status: He is oriented to person, place, and time.  Psychiatric:        Behavior: Behavior normal.        Thought Content: Thought content normal.        Judgment: Judgment normal.    BP 106/72   Pulse (!) 101   Resp 12   Ht '5\' 9"'$   (1.753 m)   Wt 175 lb (79.4 kg)   SpO2 96%   BMI 25.84 kg/m  Wt Readings from Last 3 Encounters:  07/25/18 175 lb (79.4 kg)  05/07/18 181 lb 8 oz (82.3 kg)  02/07/18 177 lb 12.8 oz (80.6 kg)     Lab Results  Component Value Date   WBC 8.8 05/07/2018   HGB 17.8 (H) 05/07/2018   HCT 50.3 05/07/2018   PLT 244.0 05/07/2018   GLUCOSE 144 (H) 05/07/2018   CHOL 138 12/10/2017   TRIG 154.0 (H) 12/10/2017   HDL 51.00 12/10/2017  LDLDIRECT 156.0 05/09/2017   LDLCALC 56 12/10/2017   ALT 16 05/07/2018   AST 17 05/07/2018   NA 136 05/07/2018   K 4.1 05/07/2018   CL 103 05/07/2018   CREATININE 1.02 05/07/2018   BUN 15 05/07/2018   CO2 26 05/07/2018   TSH 1.59 02/13/2016   INR 1.0 09/04/2017   HGBA1C 6.6 (H) 12/10/2017   MICROALBUR 2.5 (H) 02/13/2016    Ct Abdomen Pelvis W Contrast  Result Date: 05/23/2018 CLINICAL DATA:  Follow-up spot on pancreas EXAM: CT ABDOMEN AND PELVIS WITH CONTRAST TECHNIQUE: Multidetector CT imaging of the abdomen and pelvis was performed using the standard protocol following bolus administration of intravenous contrast. CONTRAST:  129m ISOVUE-300 IOPAMIDOL (ISOVUE-300) INJECTION 61% [facility] FINDINGS: Lower chest: Minimal dependent atelectasis in the bilateral lower lobes. Hepatobiliary: Liver is within normal limits. Gallbladder is unremarkable. No intrahepatic or extrahepatic ductal dilatation. Pancreas: Within normal limits. No pancreatic mass, atrophy, or ductal dilatation. Spleen: Within normal limits. Adrenals/Urinary Tract: Adrenal glands are within normal limits. Right kidney is within normal limits. Left kidney is within normal limits. Moderate left hydronephrosis. Associated 7 mm proximal left ureteral calculus at the L3-4 level (coronal image 51), previously intrarenal. Bladder is within normal limits. Stomach/Bowel: Stomach is within normal limits. No evidence of bowel obstruction. Prior  appendectomy. Postsurgical changes related to partial left hemicolectomy with suture line in the lower pelvis (series 2/image 35). Additional scattered left colonic diverticulosis, without evidence of diverticulitis. Vascular/Lymphatic: No evidence of abdominal aortic aneurysm. Atherosclerotic calcifications of the abdominal aorta and branch vessels. No suspicious abdominopelvic lymphadenopathy. Reproductive: Prostate is grossly unremarkable. Other: No abdominopelvic ascites. Tiny fat containing left inguinal hernia (series 2/image 83). Musculoskeletal: Visualized osseous structures are within normal limits. IMPRESSION: Pancreas is within normal limits on CT. 7 mm proximal left ureteral calculus at the L3-4 level. Associated moderate left hydronephrosis, new. Additional stable ancillary findings as above. Electronically Signed   By: SJulian HyM.D.   On: 05/23/2018 10:10     Assessment & Plan:  Plan  I am having MTomasa Hostellermaintain his rosuvastatin, ezetimibe, sitaGLIPtin, varenicline, pantoprazole, cyclobenzaprine, ondansetron, metoprolol succinate, ibuprofen, fenofibrate, gabapentin, metFORMIN, glucose blood, ONETOUCH DELICA PLUS [id number] ARIPiprazole, lamoTRIgine, and traZODone.  No orders of the defined types were placed in this encounter.   Problem List Items Addressed This Visit    None    Visit Diagnoses    Numbness and tingling of right hand    -  Primary   Relevant Orders   Ambulatory referral to Hand Surgery    wrist splint given  Ice prn Refer to hand specialist  Follow-up: Return if symptoms worsen or fail to improve.  YAnn Held DO

## 2018-07-25 NOTE — Patient Instructions (Signed)
Carpal Tunnel Syndrome    Carpal tunnel syndrome is a condition that causes pain in your hand and arm. The carpal tunnel is a narrow area that is on the palm side of your wrist. Repeated wrist motion or certain diseases may cause swelling in the tunnel. This swelling can pinch the main nerve in the wrist (median nerve).  What are the causes?  This condition may be caused by:   Repeated wrist motions.   Wrist injuries.   Arthritis.   A sac of fluid (cyst) or abnormal growth (tumor) in the carpal tunnel.   Fluid buildup during pregnancy.  Sometimes the cause is not known.  What increases the risk?  The following factors may make you more likely to develop this condition:   Having a job in which you move your wrist in the same way many times. This includes jobs like being a butcher or a cashier.   Being a woman.   Having other health conditions, such as:  ? Diabetes.  ? Obesity.  ? A thyroid gland that is not active enough (hypothyroidism).  ? Kidney failure.  What are the signs or symptoms?  Symptoms of this condition include:   A tingling feeling in your fingers.   Tingling or a loss of feeling (numbness) in your hand.   Pain in your entire arm. This pain may get worse when you bend your wrist and elbow for a long time.   Pain in your wrist that goes up your arm to your shoulder.   Pain that goes down into your palm or fingers.   A weak feeling in your hands. You may find it hard to grab and hold items.  You may feel worse at night.  How is this diagnosed?  This condition is diagnosed with a medical history and physical exam. You may also have tests, such as:   Electromyogram (EMG). This test checks the signals that the nerves send to the muscles.   Nerve conduction study. This test checks how well signals pass through your nerves.   Imaging tests, such as X-rays, ultrasound, and MRI. These tests check for what might be the cause of your condition.  How is this treated?  This condition may be treated  with:   Lifestyle changes. You will be asked to stop or change the activity that caused your problem.   Doing exercise and activities that make bones and muscles stronger (physical therapy).   Learning how to use your hand again (occupational therapy).   Medicines for pain and swelling (inflammation). You may have injections in your wrist.   A wrist splint.   Surgery.  Follow these instructions at home:  If you have a splint:   Wear the splint as told by your doctor. Remove it only as told by your doctor.   Loosen the splint if your fingers:  ? Tingle.  ? Lose feeling (become numb).  ? Turn cold and blue.   Keep the splint clean.   If the splint is not waterproof:  ? Do not let it get wet.  ? Cover it with a watertight covering when you take a bath or a shower.  Managing pain, stiffness, and swelling     If told, put ice on the painful area:  ? If you have a removable splint, remove it as told by your doctor.  ? Put ice in a plastic bag.  ? Place a towel between your skin and the bag.  ? Leave the   ice on for 20 minutes, 2-3 times per day.  General instructions   Take over-the-counter and prescription medicines only as told by your doctor.   Rest your wrist from any activity that may cause pain. If needed, talk with your boss at work about changes that can help your wrist heal.   Do any exercises as told by your doctor, physical therapist, or occupational therapist.   Keep all follow-up visits as told by your doctor. This is important.  Contact a doctor if:   You have new symptoms.   Medicine does not help your pain.   Your symptoms get worse.  Get help right away if:   You have very bad numbness or tingling in your wrist or hand.  Summary   Carpal tunnel syndrome is a condition that causes pain in your hand and arm.   It is often caused by repeated wrist motions.   Lifestyle changes and medicines are used to treat this problem. Surgery may help in very bad cases.   Follow your doctor's  instructions about wearing a splint, resting your wrist, keeping follow-up visits, and calling for help.  This information is not intended to replace advice given to you by your health care provider. Make sure you discuss any questions you have with your health care provider.  Document Released: 05/03/2011 Document Revised: 09/20/2017 Document Reviewed: 09/20/2017  Elsevier Interactive Patient Education  2019 [employer]

## 2018-08-14 DIAGNOSIS — Z79899 Other long term (current) drug therapy: Secondary | ICD-10-CM | POA: Diagnosis not present

## 2018-08-14 DIAGNOSIS — M47817 Spondylosis without myelopathy or radiculopathy, lumbosacral region: Secondary | ICD-10-CM | POA: Diagnosis not present

## 2018-08-26 ENCOUNTER — Telehealth: Payer: Self-pay | Admitting: Family Medicine

## 2018-08-26 NOTE — Telephone Encounter (Signed)
Pt left vm stating that he is allergic to Bees and is requesting a new rx for a epi pen. If someone could advise on this request

## 2018-08-27 ENCOUNTER — Other Ambulatory Visit: Payer: Self-pay | Admitting: Family Medicine

## 2018-08-27 DIAGNOSIS — Z91030 Bee allergy status: Secondary | ICD-10-CM

## 2018-08-27 MED ORDER — EPINEPHRINE 0.3 MG/0.3ML IJ SOAJ: 0.3000 mg | INTRAMUSCULAR | 1 refills | Status: DC | PRN

## 2018-08-27 NOTE — Telephone Encounter (Signed)
Please advise 

## 2018-08-27 NOTE — Telephone Encounter (Signed)
Sent in

## 2018-08-27 NOTE — Telephone Encounter (Signed)
Pt informed

## 2018-09-09 ENCOUNTER — Ambulatory Visit (HOSPITAL_COMMUNITY): Payer: PPO | Admitting: Psychiatry

## 2018-09-09 DIAGNOSIS — M62830 Muscle spasm of back: Secondary | ICD-10-CM | POA: Diagnosis not present

## 2018-09-09 DIAGNOSIS — F17210 Nicotine dependence, cigarettes, uncomplicated: Secondary | ICD-10-CM | POA: Diagnosis not present

## 2018-09-09 DIAGNOSIS — M47817 Spondylosis without myelopathy or radiculopathy, lumbosacral region: Secondary | ICD-10-CM | POA: Diagnosis not present

## 2018-09-09 DIAGNOSIS — F17200 Nicotine dependence, unspecified, uncomplicated: Secondary | ICD-10-CM | POA: Diagnosis not present

## 2018-09-09 DIAGNOSIS — L570 Actinic keratosis: Secondary | ICD-10-CM | POA: Diagnosis not present

## 2018-09-09 DIAGNOSIS — R42 Dizziness and giddiness: Secondary | ICD-10-CM | POA: Diagnosis not present

## 2018-09-09 DIAGNOSIS — C3411 Malignant neoplasm of upper lobe, right bronchus or lung: Secondary | ICD-10-CM | POA: Diagnosis not present

## 2018-09-09 DIAGNOSIS — R531 Weakness: Secondary | ICD-10-CM | POA: Diagnosis not present

## 2018-09-09 DIAGNOSIS — L821 Other seborrheic keratosis: Secondary | ICD-10-CM | POA: Diagnosis not present

## 2018-09-09 DIAGNOSIS — L814 Other melanin hyperpigmentation: Secondary | ICD-10-CM | POA: Diagnosis not present

## 2018-09-09 DIAGNOSIS — Z23 Encounter for immunization: Secondary | ICD-10-CM | POA: Diagnosis not present

## 2018-09-09 DIAGNOSIS — M129 Arthropathy, unspecified: Secondary | ICD-10-CM | POA: Diagnosis not present

## 2018-09-09 DIAGNOSIS — R5383 Other fatigue: Secondary | ICD-10-CM | POA: Diagnosis not present

## 2018-09-09 DIAGNOSIS — Z85828 Personal history of other malignant neoplasm of skin: Secondary | ICD-10-CM | POA: Diagnosis not present

## 2018-09-09 DIAGNOSIS — D1801 Hemangioma of skin and subcutaneous tissue: Secondary | ICD-10-CM | POA: Diagnosis not present

## 2018-09-09 DIAGNOSIS — I10 Essential (primary) hypertension: Secondary | ICD-10-CM | POA: Diagnosis not present

## 2018-09-09 DIAGNOSIS — K5669 Other partial intestinal obstruction: Secondary | ICD-10-CM | POA: Diagnosis not present

## 2018-09-09 DIAGNOSIS — L723 Sebaceous cyst: Secondary | ICD-10-CM | POA: Diagnosis not present

## 2018-09-09 DIAGNOSIS — I2510 Atherosclerotic heart disease of native coronary artery without angina pectoris: Secondary | ICD-10-CM | POA: Diagnosis not present

## 2018-09-09 DIAGNOSIS — R4020 Unspecified coma: Secondary | ICD-10-CM | POA: Diagnosis not present

## 2018-09-09 DIAGNOSIS — E559 Vitamin D deficiency, unspecified: Secondary | ICD-10-CM | POA: Diagnosis not present

## 2018-09-09 DIAGNOSIS — Z79899 Other long term (current) drug therapy: Secondary | ICD-10-CM | POA: Diagnosis not present

## 2018-09-09 DIAGNOSIS — E1165 Type 2 diabetes mellitus with hyperglycemia: Secondary | ICD-10-CM | POA: Diagnosis not present

## 2018-09-09 DIAGNOSIS — I4891 Unspecified atrial fibrillation: Secondary | ICD-10-CM | POA: Diagnosis not present

## 2018-09-17 ENCOUNTER — Other Ambulatory Visit: Payer: Self-pay

## 2018-09-17 ENCOUNTER — Encounter (HOSPITAL_COMMUNITY): Payer: Self-pay | Admitting: Psychiatry

## 2018-09-17 ENCOUNTER — Ambulatory Visit (INDEPENDENT_AMBULATORY_CARE_PROVIDER_SITE_OTHER): Payer: PPO | Admitting: Psychiatry

## 2018-09-17 DIAGNOSIS — F1020 Alcohol dependence, uncomplicated: Secondary | ICD-10-CM | POA: Diagnosis not present

## 2018-09-17 DIAGNOSIS — F3131 Bipolar disorder, current episode depressed, mild: Secondary | ICD-10-CM

## 2018-09-17 MED ORDER — TRAZODONE HCL 150 MG PO TABS: 150.0000 mg | ORAL_TABLET | Freq: Every evening | ORAL | 0 refills | Status: DC | PRN

## 2018-09-17 MED ORDER — ARIPIPRAZOLE 15 MG PO TABS: 15.0000 mg | ORAL_TABLET | Freq: Every evening | ORAL | 0 refills | Status: DC
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
## 2018-10-08 DIAGNOSIS — M62830 Muscle spasm of back: Secondary | ICD-10-CM | POA: Diagnosis not present

## 2018-10-08 DIAGNOSIS — F17210 Nicotine dependence, cigarettes, uncomplicated: Secondary | ICD-10-CM | POA: Diagnosis not present

## 2018-10-08 DIAGNOSIS — Z79891 Long term (current) use of opiate analgesic: Secondary | ICD-10-CM | POA: Diagnosis not present

## 2018-10-08 DIAGNOSIS — Z79899 Other long term (current) drug therapy: Secondary | ICD-10-CM | POA: Diagnosis not present

## 2018-10-08 DIAGNOSIS — M47817 Spondylosis without myelopathy or radiculopathy, lumbosacral region: Secondary | ICD-10-CM | POA: Diagnosis not present

## 2018-10-29 DIAGNOSIS — Z1159 Encounter for screening for other viral diseases: Secondary | ICD-10-CM | POA: Diagnosis not present

## 2018-10-29 DIAGNOSIS — Z79891 Long term (current) use of opiate analgesic: Secondary | ICD-10-CM | POA: Diagnosis not present

## 2018-10-29 DIAGNOSIS — M62830 Muscle spasm of back: Secondary | ICD-10-CM | POA: Diagnosis not present

## 2018-10-29 DIAGNOSIS — F17210 Nicotine dependence, cigarettes, uncomplicated: Secondary | ICD-10-CM | POA: Diagnosis not present

## 2018-10-29 DIAGNOSIS — Z79899 Other long term (current) drug therapy: Secondary | ICD-10-CM | POA: Diagnosis not present

## 2018-10-29 DIAGNOSIS — M47817 Spondylosis without myelopathy or radiculopathy, lumbosacral region: Secondary | ICD-10-CM | POA: Diagnosis not present

## 2018-11-17 ENCOUNTER — Encounter (HOSPITAL_COMMUNITY): Payer: Self-pay | Admitting: Psychiatry

## 2018-11-17 ENCOUNTER — Other Ambulatory Visit: Payer: Self-pay

## 2018-11-17 ENCOUNTER — Ambulatory Visit (INDEPENDENT_AMBULATORY_CARE_PROVIDER_SITE_OTHER): Payer: PPO | Admitting: Psychiatry

## 2018-11-17 DIAGNOSIS — F1020 Alcohol dependence, uncomplicated: Secondary | ICD-10-CM

## 2018-11-17 DIAGNOSIS — F3131 Bipolar disorder, current episode depressed, mild: Secondary | ICD-10-CM | POA: Diagnosis not present

## 2018-11-17 MED ORDER — ARIPIPRAZOLE 15 MG PO TABS: 15.0000 mg | ORAL_TABLET | Freq: Every evening | ORAL | 2 refills | Status: DC

## 2018-11-17 MED ORDER — LAMOTRIGINE 200 MG PO TABS: 200.0000 mg | ORAL_TABLET | Freq: Every day | ORAL | 0 refills | Status: DC

## 2018-11-17 MED ORDER — TRAZODONE HCL 150 MG PO TABS: 150.0000 mg | ORAL_TABLET | Freq: Every evening | ORAL | 0 refills | Status: DC | PRN
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
## 2018-11-26 DIAGNOSIS — M62830 Muscle spasm of back: Secondary | ICD-10-CM | POA: Diagnosis not present

## 2018-11-26 DIAGNOSIS — F17210 Nicotine dependence, cigarettes, uncomplicated: Secondary | ICD-10-CM | POA: Diagnosis not present

## 2018-11-26 DIAGNOSIS — Z79891 Long term (current) use of opiate analgesic: Secondary | ICD-10-CM | POA: Diagnosis not present

## 2018-11-26 DIAGNOSIS — M47817 Spondylosis without myelopathy or radiculopathy, lumbosacral region: Secondary | ICD-10-CM | POA: Diagnosis not present

## 2018-11-26 DIAGNOSIS — Z79899 Other long term (current) drug therapy: Secondary | ICD-10-CM | POA: Diagnosis not present

## 2018-12-11 DIAGNOSIS — N5201 Erectile dysfunction due to arterial insufficiency: Secondary | ICD-10-CM | POA: Diagnosis not present

## 2018-12-11 DIAGNOSIS — N201 Calculus of ureter: Secondary | ICD-10-CM | POA: Diagnosis not present

## 2018-12-11 DIAGNOSIS — N132 Hydronephrosis with renal and ureteral calculous obstruction: Secondary | ICD-10-CM | POA: Diagnosis not present

## 2018-12-30 ENCOUNTER — Encounter: Payer: Self-pay | Admitting: Gastroenterology

## 2019-01-05 DIAGNOSIS — Z79899 Other long term (current) drug therapy: Secondary | ICD-10-CM | POA: Diagnosis not present

## 2019-01-05 DIAGNOSIS — Z79891 Long term (current) use of opiate analgesic: Secondary | ICD-10-CM | POA: Diagnosis not present

## 2019-01-05 DIAGNOSIS — M47817 Spondylosis without myelopathy or radiculopathy, lumbosacral region: Secondary | ICD-10-CM | POA: Diagnosis not present

## 2019-01-05 DIAGNOSIS — M62830 Muscle spasm of back: Secondary | ICD-10-CM | POA: Diagnosis not present

## 2019-01-05 DIAGNOSIS — F17210 Nicotine dependence, cigarettes, uncomplicated: Secondary | ICD-10-CM | POA: Diagnosis not present

## 2019-01-29 DIAGNOSIS — I10 Essential (primary) hypertension: Secondary | ICD-10-CM | POA: Diagnosis not present

## 2019-01-29 DIAGNOSIS — M47817 Spondylosis without myelopathy or radiculopathy, lumbosacral region: Secondary | ICD-10-CM | POA: Diagnosis not present

## 2019-01-29 DIAGNOSIS — E559 Vitamin D deficiency, unspecified: Secondary | ICD-10-CM | POA: Diagnosis not present

## 2019-01-29 DIAGNOSIS — Z1159 Encounter for screening for other viral diseases: Secondary | ICD-10-CM | POA: Diagnosis not present

## 2019-01-29 DIAGNOSIS — Z79899 Other long term (current) drug therapy: Secondary | ICD-10-CM | POA: Diagnosis not present

## 2019-01-29 DIAGNOSIS — F17210 Nicotine dependence, cigarettes, uncomplicated: Secondary | ICD-10-CM | POA: Diagnosis not present

## 2019-01-29 DIAGNOSIS — M129 Arthropathy, unspecified: Secondary | ICD-10-CM | POA: Diagnosis not present

## 2019-01-29 DIAGNOSIS — Z79891 Long term (current) use of opiate analgesic: Secondary | ICD-10-CM | POA: Diagnosis not present

## 2019-01-29 DIAGNOSIS — M62830 Muscle spasm of back: Secondary | ICD-10-CM | POA: Diagnosis not present

## 2019-02-16 DIAGNOSIS — R52 Pain, unspecified: Secondary | ICD-10-CM | POA: Diagnosis not present

## 2019-02-16 DIAGNOSIS — F17210 Nicotine dependence, cigarettes, uncomplicated: Secondary | ICD-10-CM | POA: Diagnosis not present

## 2019-02-16 DIAGNOSIS — J029 Acute pharyngitis, unspecified: Secondary | ICD-10-CM | POA: Diagnosis not present

## 2019-02-16 DIAGNOSIS — L02224 Furuncle of groin: Secondary | ICD-10-CM | POA: Diagnosis not present

## 2019-02-16 DIAGNOSIS — R05 Cough: Secondary | ICD-10-CM | POA: Diagnosis not present

## 2019-02-16 DIAGNOSIS — J209 Acute bronchitis, unspecified: Secondary | ICD-10-CM | POA: Diagnosis not present

## 2019-02-17 ENCOUNTER — Encounter (HOSPITAL_COMMUNITY): Payer: Self-pay | Admitting: Psychiatry

## 2019-02-17 ENCOUNTER — Ambulatory Visit (INDEPENDENT_AMBULATORY_CARE_PROVIDER_SITE_OTHER): Payer: PPO | Admitting: Psychiatry

## 2019-02-17 ENCOUNTER — Other Ambulatory Visit: Payer: Self-pay

## 2019-02-17 DIAGNOSIS — F17200 Nicotine dependence, unspecified, uncomplicated: Secondary | ICD-10-CM | POA: Diagnosis not present

## 2019-02-17 DIAGNOSIS — F3131 Bipolar disorder, current episode depressed, mild: Secondary | ICD-10-CM

## 2019-02-17 DIAGNOSIS — F1020 Alcohol dependence, uncomplicated: Secondary | ICD-10-CM

## 2019-02-17 MED ORDER — TRAZODONE HCL 150 MG PO TABS: 150.0000 mg | ORAL_TABLET | Freq: Every evening | ORAL | 0 refills | Status: DC | PRN

## 2019-02-17 MED ORDER — ARIPIPRAZOLE 15 MG PO TABS: 15.0000 mg | ORAL_TABLET | Freq: Every evening | ORAL | 2 refills | Status: DC

## 2019-02-17 MED ORDER — LAMOTRIGINE 200 MG PO TABS: 200.0000 mg | ORAL_TABLET | Freq: Every day | ORAL | 0 refills | Status: DC
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
## 2019-02-24 DIAGNOSIS — F17210 Nicotine dependence, cigarettes, uncomplicated: Secondary | ICD-10-CM | POA: Diagnosis not present

## 2019-02-24 DIAGNOSIS — R05 Cough: Secondary | ICD-10-CM | POA: Diagnosis not present

## 2019-02-24 DIAGNOSIS — J209 Acute bronchitis, unspecified: Secondary | ICD-10-CM | POA: Diagnosis not present

## 2019-02-24 DIAGNOSIS — L02224 Furuncle of groin: Secondary | ICD-10-CM | POA: Diagnosis not present

## 2019-03-02 DIAGNOSIS — Z79891 Long term (current) use of opiate analgesic: Secondary | ICD-10-CM | POA: Diagnosis not present

## 2019-03-02 DIAGNOSIS — F17210 Nicotine dependence, cigarettes, uncomplicated: Secondary | ICD-10-CM | POA: Diagnosis not present

## 2019-03-02 DIAGNOSIS — F1290 Cannabis use, unspecified, uncomplicated: Secondary | ICD-10-CM | POA: Diagnosis not present

## 2019-03-02 DIAGNOSIS — Z79899 Other long term (current) drug therapy: Secondary | ICD-10-CM | POA: Diagnosis not present

## 2019-03-02 DIAGNOSIS — M62830 Muscle spasm of back: Secondary | ICD-10-CM | POA: Diagnosis not present

## 2019-03-02 DIAGNOSIS — M47817 Spondylosis without myelopathy or radiculopathy, lumbosacral region: Secondary | ICD-10-CM | POA: Diagnosis not present

## 2019-03-31 DIAGNOSIS — Z79899 Other long term (current) drug therapy: Secondary | ICD-10-CM | POA: Diagnosis not present

## 2019-03-31 DIAGNOSIS — Z79891 Long term (current) use of opiate analgesic: Secondary | ICD-10-CM | POA: Diagnosis not present

## 2019-03-31 DIAGNOSIS — Z23 Encounter for immunization: Secondary | ICD-10-CM | POA: Diagnosis not present

## 2019-03-31 DIAGNOSIS — F1290 Cannabis use, unspecified, uncomplicated: Secondary | ICD-10-CM | POA: Diagnosis not present

## 2019-03-31 DIAGNOSIS — M47817 Spondylosis without myelopathy or radiculopathy, lumbosacral region: Secondary | ICD-10-CM | POA: Diagnosis not present

## 2019-03-31 DIAGNOSIS — F17210 Nicotine dependence, cigarettes, uncomplicated: Secondary | ICD-10-CM | POA: Diagnosis not present

## 2019-03-31 DIAGNOSIS — M62830 Muscle spasm of back: Secondary | ICD-10-CM | POA: Diagnosis not present

## 2019-04-28 DIAGNOSIS — M62830 Muscle spasm of back: Secondary | ICD-10-CM | POA: Diagnosis not present

## 2019-04-28 DIAGNOSIS — F17210 Nicotine dependence, cigarettes, uncomplicated: Secondary | ICD-10-CM | POA: Diagnosis not present

## 2019-04-28 DIAGNOSIS — Z79891 Long term (current) use of opiate analgesic: Secondary | ICD-10-CM | POA: Diagnosis not present

## 2019-04-28 DIAGNOSIS — F1290 Cannabis use, unspecified, uncomplicated: Secondary | ICD-10-CM | POA: Diagnosis not present

## 2019-04-28 DIAGNOSIS — Z79899 Other long term (current) drug therapy: Secondary | ICD-10-CM | POA: Diagnosis not present

## 2019-04-28 DIAGNOSIS — M47817 Spondylosis without myelopathy or radiculopathy, lumbosacral region: Secondary | ICD-10-CM | POA: Diagnosis not present

## 2019-05-18 ENCOUNTER — Ambulatory Visit (HOSPITAL_COMMUNITY): Payer: PPO | Admitting: Psychiatry

## 2019-05-25 ENCOUNTER — Other Ambulatory Visit: Payer: Self-pay

## 2019-05-25 ENCOUNTER — Ambulatory Visit (INDEPENDENT_AMBULATORY_CARE_PROVIDER_SITE_OTHER): Payer: PPO | Admitting: Psychiatry

## 2019-05-25 ENCOUNTER — Encounter (HOSPITAL_COMMUNITY): Payer: Self-pay | Admitting: Psychiatry

## 2019-05-25 DIAGNOSIS — F3131 Bipolar disorder, current episode depressed, mild: Secondary | ICD-10-CM | POA: Diagnosis not present

## 2019-05-25 DIAGNOSIS — F1020 Alcohol dependence, uncomplicated: Secondary | ICD-10-CM | POA: Diagnosis not present

## 2019-05-25 MED ORDER — MIRTAZAPINE 15 MG PO TABS: 15.0000 mg | ORAL_TABLET | Freq: Every day | ORAL | 2 refills | Status: DC

## 2019-05-25 MED ORDER — LAMOTRIGINE 200 MG PO TABS: 200.0000 mg | ORAL_TABLET | Freq: Every day | ORAL | 0 refills | Status: DC

## 2019-05-25 MED ORDER — ARIPIPRAZOLE 15 MG PO TABS: 15.0000 mg | ORAL_TABLET | Freq: Every evening | ORAL | 2 refills | Status: DC
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
## 2019-05-28 DIAGNOSIS — R8290 Unspecified abnormal findings in urine: Secondary | ICD-10-CM | POA: Diagnosis not present

## 2019-05-28 DIAGNOSIS — Z79899 Other long term (current) drug therapy: Secondary | ICD-10-CM | POA: Diagnosis not present

## 2019-05-28 DIAGNOSIS — Z1331 Encounter for screening for depression: Secondary | ICD-10-CM | POA: Diagnosis not present

## 2019-05-28 DIAGNOSIS — Z131 Encounter for screening for diabetes mellitus: Secondary | ICD-10-CM | POA: Diagnosis not present

## 2019-05-28 DIAGNOSIS — N529 Male erectile dysfunction, unspecified: Secondary | ICD-10-CM | POA: Diagnosis not present

## 2019-05-28 DIAGNOSIS — R0602 Shortness of breath: Secondary | ICD-10-CM | POA: Diagnosis not present

## 2019-05-28 DIAGNOSIS — Z1339 Encounter for screening examination for other mental health and behavioral disorders: Secondary | ICD-10-CM | POA: Diagnosis not present

## 2019-05-28 DIAGNOSIS — Z125 Encounter for screening for malignant neoplasm of prostate: Secondary | ICD-10-CM | POA: Diagnosis not present

## 2019-05-28 DIAGNOSIS — Z1159 Encounter for screening for other viral diseases: Secondary | ICD-10-CM | POA: Diagnosis not present

## 2019-05-28 DIAGNOSIS — E559 Vitamin D deficiency, unspecified: Secondary | ICD-10-CM | POA: Diagnosis not present

## 2019-05-28 DIAGNOSIS — R350 Frequency of micturition: Secondary | ICD-10-CM | POA: Diagnosis not present

## 2019-05-28 DIAGNOSIS — J309 Allergic rhinitis, unspecified: Secondary | ICD-10-CM | POA: Diagnosis not present

## 2019-05-28 DIAGNOSIS — M129 Arthropathy, unspecified: Secondary | ICD-10-CM | POA: Diagnosis not present

## 2019-05-28 DIAGNOSIS — Z114 Encounter for screening for human immunodeficiency virus [HIV]: Secondary | ICD-10-CM | POA: Diagnosis not present

## 2019-05-28 DIAGNOSIS — E7800 Pure hypercholesterolemia, unspecified: Secondary | ICD-10-CM | POA: Diagnosis not present

## 2019-05-28 DIAGNOSIS — F17210 Nicotine dependence, cigarettes, uncomplicated: Secondary | ICD-10-CM | POA: Diagnosis not present

## 2019-05-28 DIAGNOSIS — Z0000 Encounter for general adult medical examination without abnormal findings: Secondary | ICD-10-CM | POA: Diagnosis not present

## 2019-05-28 DIAGNOSIS — R5383 Other fatigue: Secondary | ICD-10-CM | POA: Diagnosis not present

## 2019-06-25 DIAGNOSIS — F17210 Nicotine dependence, cigarettes, uncomplicated: Secondary | ICD-10-CM | POA: Diagnosis not present

## 2019-06-25 DIAGNOSIS — Z79899 Other long term (current) drug therapy: Secondary | ICD-10-CM | POA: Diagnosis not present

## 2019-06-25 DIAGNOSIS — F1290 Cannabis use, unspecified, uncomplicated: Secondary | ICD-10-CM | POA: Diagnosis not present

## 2019-06-25 DIAGNOSIS — M47817 Spondylosis without myelopathy or radiculopathy, lumbosacral region: Secondary | ICD-10-CM | POA: Diagnosis not present

## 2019-06-25 DIAGNOSIS — M62830 Muscle spasm of back: Secondary | ICD-10-CM | POA: Diagnosis not present

## 2019-06-25 DIAGNOSIS — Z79891 Long term (current) use of opiate analgesic: Secondary | ICD-10-CM | POA: Diagnosis not present

## 2019-07-01 DIAGNOSIS — R9431 Abnormal electrocardiogram [ECG] [EKG]: Secondary | ICD-10-CM | POA: Diagnosis not present

## 2019-07-08 DIAGNOSIS — R0989 Other specified symptoms and signs involving the circulatory and respiratory systems: Secondary | ICD-10-CM | POA: Diagnosis not present

## 2019-07-08 DIAGNOSIS — M79661 Pain in right lower leg: Secondary | ICD-10-CM | POA: Diagnosis not present

## 2019-07-08 DIAGNOSIS — M79662 Pain in left lower leg: Secondary | ICD-10-CM | POA: Diagnosis not present

## 2019-07-08 DIAGNOSIS — F17210 Nicotine dependence, cigarettes, uncomplicated: Secondary | ICD-10-CM | POA: Diagnosis not present

## 2019-07-08 DIAGNOSIS — R9431 Abnormal electrocardiogram [ECG] [EKG]: Secondary | ICD-10-CM | POA: Diagnosis not present

## 2019-07-08 DIAGNOSIS — Z720 Tobacco use: Secondary | ICD-10-CM | POA: Diagnosis not present

## 2019-07-24 DIAGNOSIS — F17210 Nicotine dependence, cigarettes, uncomplicated: Secondary | ICD-10-CM | POA: Diagnosis not present

## 2019-07-24 DIAGNOSIS — G629 Polyneuropathy, unspecified: Secondary | ICD-10-CM | POA: Diagnosis not present

## 2019-07-24 DIAGNOSIS — Z79899 Other long term (current) drug therapy: Secondary | ICD-10-CM | POA: Diagnosis not present

## 2019-07-24 DIAGNOSIS — Z79891 Long term (current) use of opiate analgesic: Secondary | ICD-10-CM | POA: Diagnosis not present

## 2019-07-24 DIAGNOSIS — M62830 Muscle spasm of back: Secondary | ICD-10-CM | POA: Diagnosis not present

## 2019-07-24 DIAGNOSIS — M47817 Spondylosis without myelopathy or radiculopathy, lumbosacral region: Secondary | ICD-10-CM | POA: Diagnosis not present

## 2019-07-28 ENCOUNTER — Telehealth: Payer: Self-pay | Admitting: Family Medicine

## 2019-07-28 NOTE — Progress Notes (Signed)
°  Chronic Care Management   Outreach Note  07/28/2019 Name: Lucas Small MRN: [id number] DOB: 1965-06-12  Referred by: Ann Held, DO Reason for referral : No chief complaint on file.   An unsuccessful telephone outreach was attempted today. The patient was referred to the pharmacist for assistance with care management and care coordination.   Follow Up Plan:   Raynicia Dukes [employer] Scheduler

## 2019-08-20 DIAGNOSIS — Z79899 Other long term (current) drug therapy: Secondary | ICD-10-CM | POA: Diagnosis not present

## 2019-08-20 DIAGNOSIS — M47817 Spondylosis without myelopathy or radiculopathy, lumbosacral region: Secondary | ICD-10-CM | POA: Diagnosis not present

## 2019-08-20 DIAGNOSIS — Z79891 Long term (current) use of opiate analgesic: Secondary | ICD-10-CM | POA: Diagnosis not present

## 2019-08-20 DIAGNOSIS — G629 Polyneuropathy, unspecified: Secondary | ICD-10-CM | POA: Diagnosis not present

## 2019-08-20 DIAGNOSIS — M62830 Muscle spasm of back: Secondary | ICD-10-CM | POA: Diagnosis not present

## 2019-08-24 ENCOUNTER — Ambulatory Visit (INDEPENDENT_AMBULATORY_CARE_PROVIDER_SITE_OTHER): Payer: PPO | Admitting: Psychiatry

## 2019-08-24 ENCOUNTER — Other Ambulatory Visit: Payer: Self-pay

## 2019-08-24 ENCOUNTER — Encounter (HOSPITAL_COMMUNITY): Payer: Self-pay | Admitting: Psychiatry

## 2019-08-24 DIAGNOSIS — F1010 Alcohol abuse, uncomplicated: Secondary | ICD-10-CM | POA: Diagnosis not present

## 2019-08-24 DIAGNOSIS — F3131 Bipolar disorder, current episode depressed, mild: Secondary | ICD-10-CM

## 2019-08-24 MED ORDER — LAMOTRIGINE 200 MG PO TABS: 200.0000 mg | ORAL_TABLET | Freq: Every day | ORAL | 0 refills | Status: DC

## 2019-08-24 MED ORDER — ARIPIPRAZOLE 15 MG PO TABS: 15.0000 mg | ORAL_TABLET | Freq: Every evening | ORAL | 0 refills | Status: DC

## 2019-08-24 MED ORDER — MIRTAZAPINE 15 MG PO TABS: 15.0000 mg | ORAL_TABLET | Freq: Every day | ORAL | 0 refills | Status: DC
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
## 2019-09-21 DIAGNOSIS — F17210 Nicotine dependence, cigarettes, uncomplicated: Secondary | ICD-10-CM | POA: Diagnosis not present

## 2019-09-21 DIAGNOSIS — M47817 Spondylosis without myelopathy or radiculopathy, lumbosacral region: Secondary | ICD-10-CM | POA: Diagnosis not present

## 2019-09-21 DIAGNOSIS — M62830 Muscle spasm of back: Secondary | ICD-10-CM | POA: Diagnosis not present

## 2019-09-21 DIAGNOSIS — Z79899 Other long term (current) drug therapy: Secondary | ICD-10-CM | POA: Diagnosis not present

## 2019-09-21 DIAGNOSIS — Z79891 Long term (current) use of opiate analgesic: Secondary | ICD-10-CM | POA: Diagnosis not present

## 2019-09-21 DIAGNOSIS — G629 Polyneuropathy, unspecified: Secondary | ICD-10-CM | POA: Diagnosis not present

## 2019-09-24 DIAGNOSIS — R769 Abnormal immunological finding in serum, unspecified: Secondary | ICD-10-CM | POA: Diagnosis not present

## 2019-10-02 DIAGNOSIS — I10 Essential (primary) hypertension: Secondary | ICD-10-CM | POA: Diagnosis not present

## 2019-10-02 DIAGNOSIS — R5383 Other fatigue: Secondary | ICD-10-CM | POA: Diagnosis not present

## 2019-10-02 DIAGNOSIS — E7800 Pure hypercholesterolemia, unspecified: Secondary | ICD-10-CM | POA: Diagnosis not present

## 2019-10-02 DIAGNOSIS — M129 Arthropathy, unspecified: Secondary | ICD-10-CM | POA: Diagnosis not present

## 2019-10-02 DIAGNOSIS — E559 Vitamin D deficiency, unspecified: Secondary | ICD-10-CM | POA: Diagnosis not present

## 2019-10-02 DIAGNOSIS — Z79899 Other long term (current) drug therapy: Secondary | ICD-10-CM | POA: Diagnosis not present

## 2019-10-02 DIAGNOSIS — N529 Male erectile dysfunction, unspecified: Secondary | ICD-10-CM | POA: Diagnosis not present

## 2019-10-02 DIAGNOSIS — F17210 Nicotine dependence, cigarettes, uncomplicated: Secondary | ICD-10-CM | POA: Diagnosis not present

## 2019-10-02 DIAGNOSIS — R05 Cough: Secondary | ICD-10-CM | POA: Diagnosis not present

## 2019-10-02 DIAGNOSIS — Z1159 Encounter for screening for other viral diseases: Secondary | ICD-10-CM | POA: Diagnosis not present

## 2019-10-02 DIAGNOSIS — E119 Type 2 diabetes mellitus without complications: Secondary | ICD-10-CM | POA: Diagnosis not present

## 2019-10-02 DIAGNOSIS — D539 Nutritional anemia, unspecified: Secondary | ICD-10-CM | POA: Diagnosis not present

## 2019-10-02 DIAGNOSIS — J309 Allergic rhinitis, unspecified: Secondary | ICD-10-CM | POA: Diagnosis not present

## 2019-10-08 DIAGNOSIS — J309 Allergic rhinitis, unspecified: Secondary | ICD-10-CM | POA: Diagnosis not present

## 2019-10-08 DIAGNOSIS — R769 Abnormal immunological finding in serum, unspecified: Secondary | ICD-10-CM | POA: Diagnosis not present

## 2019-10-20 DIAGNOSIS — F17210 Nicotine dependence, cigarettes, uncomplicated: Secondary | ICD-10-CM | POA: Diagnosis not present

## 2019-10-20 DIAGNOSIS — Z79891 Long term (current) use of opiate analgesic: Secondary | ICD-10-CM | POA: Diagnosis not present

## 2019-10-20 DIAGNOSIS — M62830 Muscle spasm of back: Secondary | ICD-10-CM | POA: Diagnosis not present

## 2019-10-20 DIAGNOSIS — M47817 Spondylosis without myelopathy or radiculopathy, lumbosacral region: Secondary | ICD-10-CM | POA: Diagnosis not present

## 2019-10-20 DIAGNOSIS — Z79899 Other long term (current) drug therapy: Secondary | ICD-10-CM | POA: Diagnosis not present

## 2019-10-20 DIAGNOSIS — G629 Polyneuropathy, unspecified: Secondary | ICD-10-CM | POA: Diagnosis not present

## 2019-11-11 DIAGNOSIS — F17210 Nicotine dependence, cigarettes, uncomplicated: Secondary | ICD-10-CM | POA: Diagnosis not present

## 2019-11-11 DIAGNOSIS — M62830 Muscle spasm of back: Secondary | ICD-10-CM | POA: Diagnosis not present

## 2019-11-11 DIAGNOSIS — Z79899 Other long term (current) drug therapy: Secondary | ICD-10-CM | POA: Diagnosis not present

## 2019-11-11 DIAGNOSIS — G629 Polyneuropathy, unspecified: Secondary | ICD-10-CM | POA: Diagnosis not present

## 2019-11-11 DIAGNOSIS — Z79891 Long term (current) use of opiate analgesic: Secondary | ICD-10-CM | POA: Diagnosis not present

## 2019-11-11 DIAGNOSIS — M47817 Spondylosis without myelopathy or radiculopathy, lumbosacral region: Secondary | ICD-10-CM | POA: Diagnosis not present

## 2019-11-24 ENCOUNTER — Telehealth (INDEPENDENT_AMBULATORY_CARE_PROVIDER_SITE_OTHER): Payer: PPO | Admitting: Psychiatry

## 2019-11-24 ENCOUNTER — Encounter (HOSPITAL_COMMUNITY): Payer: Self-pay | Admitting: Psychiatry

## 2019-11-24 ENCOUNTER — Other Ambulatory Visit: Payer: Self-pay

## 2019-11-24 VITALS — Wt 165.0 lb

## 2019-11-24 DIAGNOSIS — F1011 Alcohol abuse, in remission: Secondary | ICD-10-CM

## 2019-11-24 DIAGNOSIS — F3131 Bipolar disorder, current episode depressed, mild: Secondary | ICD-10-CM

## 2019-11-24 MED ORDER — MIRTAZAPINE 15 MG PO TABS: 15.0000 mg | ORAL_TABLET | Freq: Every day | ORAL | 0 refills | Status: DC

## 2019-11-24 MED ORDER — LAMOTRIGINE 200 MG PO TABS: 200.0000 mg | ORAL_TABLET | Freq: Every day | ORAL | 0 refills | Status: DC

## 2019-11-24 MED ORDER — ARIPIPRAZOLE 15 MG PO TABS: 15.0000 mg | ORAL_TABLET | Freq: Every evening | ORAL | 0 refills | Status: DC
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
## 2019-12-16 DIAGNOSIS — F17210 Nicotine dependence, cigarettes, uncomplicated: Secondary | ICD-10-CM | POA: Diagnosis not present

## 2019-12-16 DIAGNOSIS — Z79899 Other long term (current) drug therapy: Secondary | ICD-10-CM | POA: Diagnosis not present

## 2019-12-16 DIAGNOSIS — G629 Polyneuropathy, unspecified: Secondary | ICD-10-CM | POA: Diagnosis not present

## 2019-12-16 DIAGNOSIS — M47817 Spondylosis without myelopathy or radiculopathy, lumbosacral region: Secondary | ICD-10-CM | POA: Diagnosis not present

## 2019-12-16 DIAGNOSIS — F1290 Cannabis use, unspecified, uncomplicated: Secondary | ICD-10-CM | POA: Diagnosis not present

## 2019-12-16 DIAGNOSIS — M62830 Muscle spasm of back: Secondary | ICD-10-CM | POA: Diagnosis not present

## 2020-01-01 IMAGING — DX DG ABDOMEN ACUTE W/ 1V CHEST
4 series · 4 of 4 positions shown · non-contrast
Comparison: 10/15/2016

CLINICAL DATA: Right upper quadrant and lower quadrant abdominal
pain

EXAM:
DG ABDOMEN ACUTE W/ 1V CHEST

[abdomen erect]
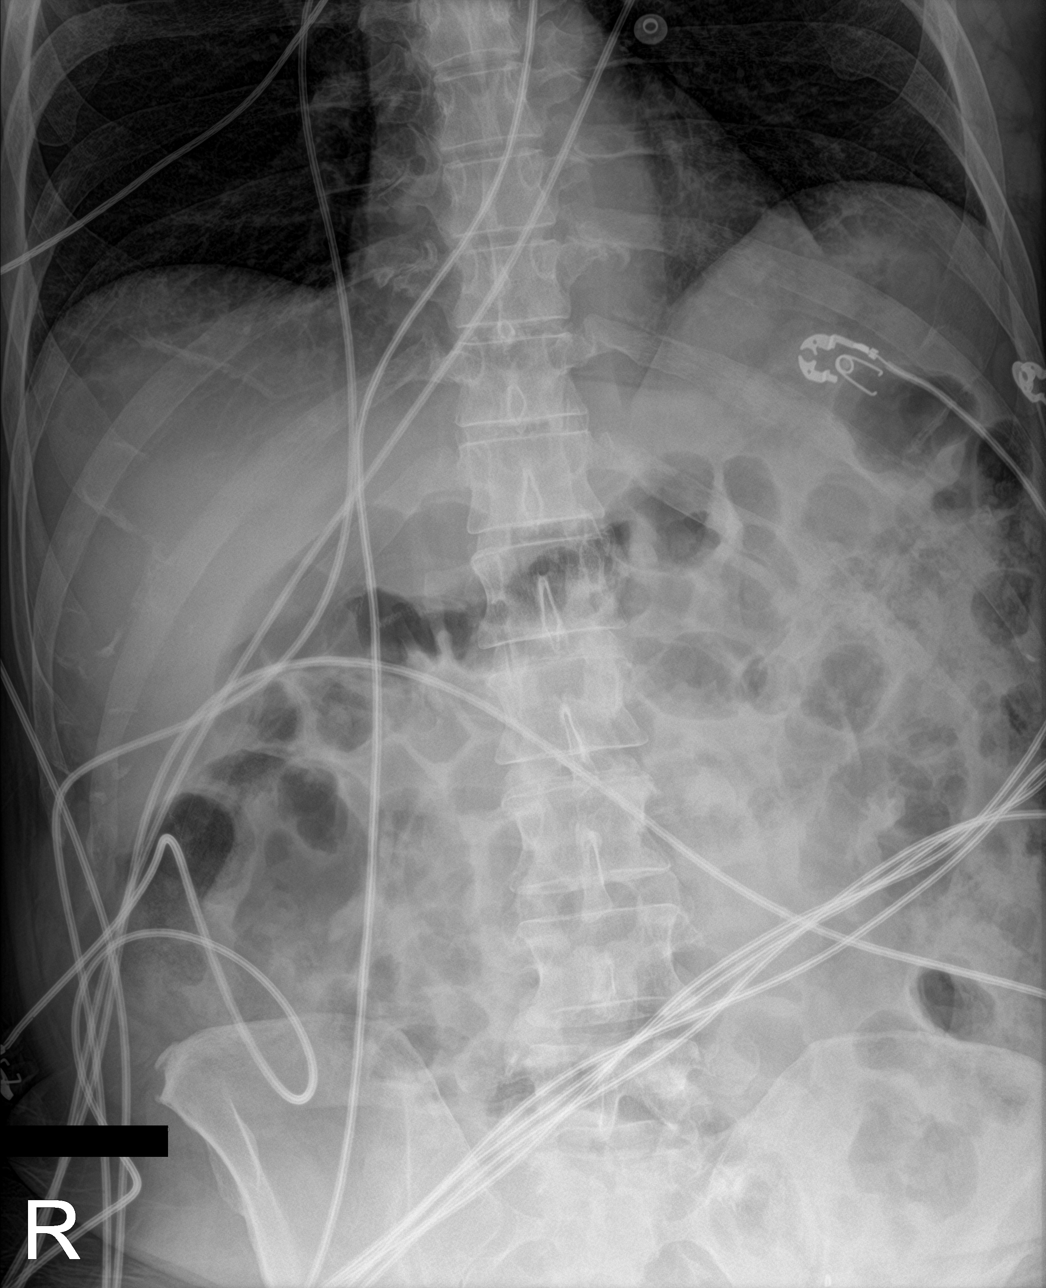

[abdomen supine (1 of 2)]
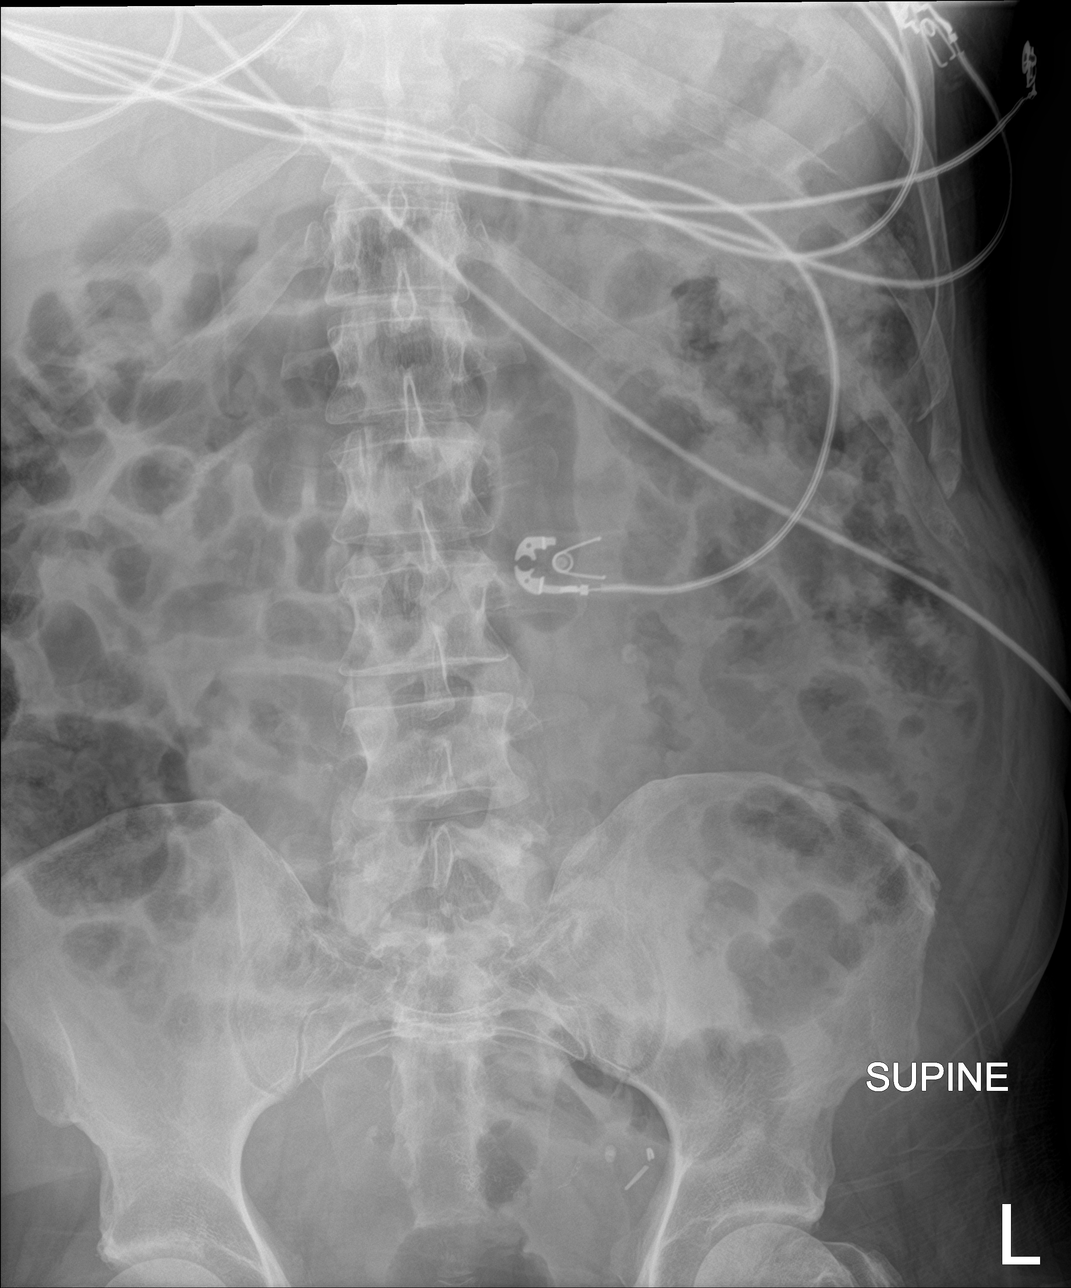

[abdomen supine (2 of 2)]
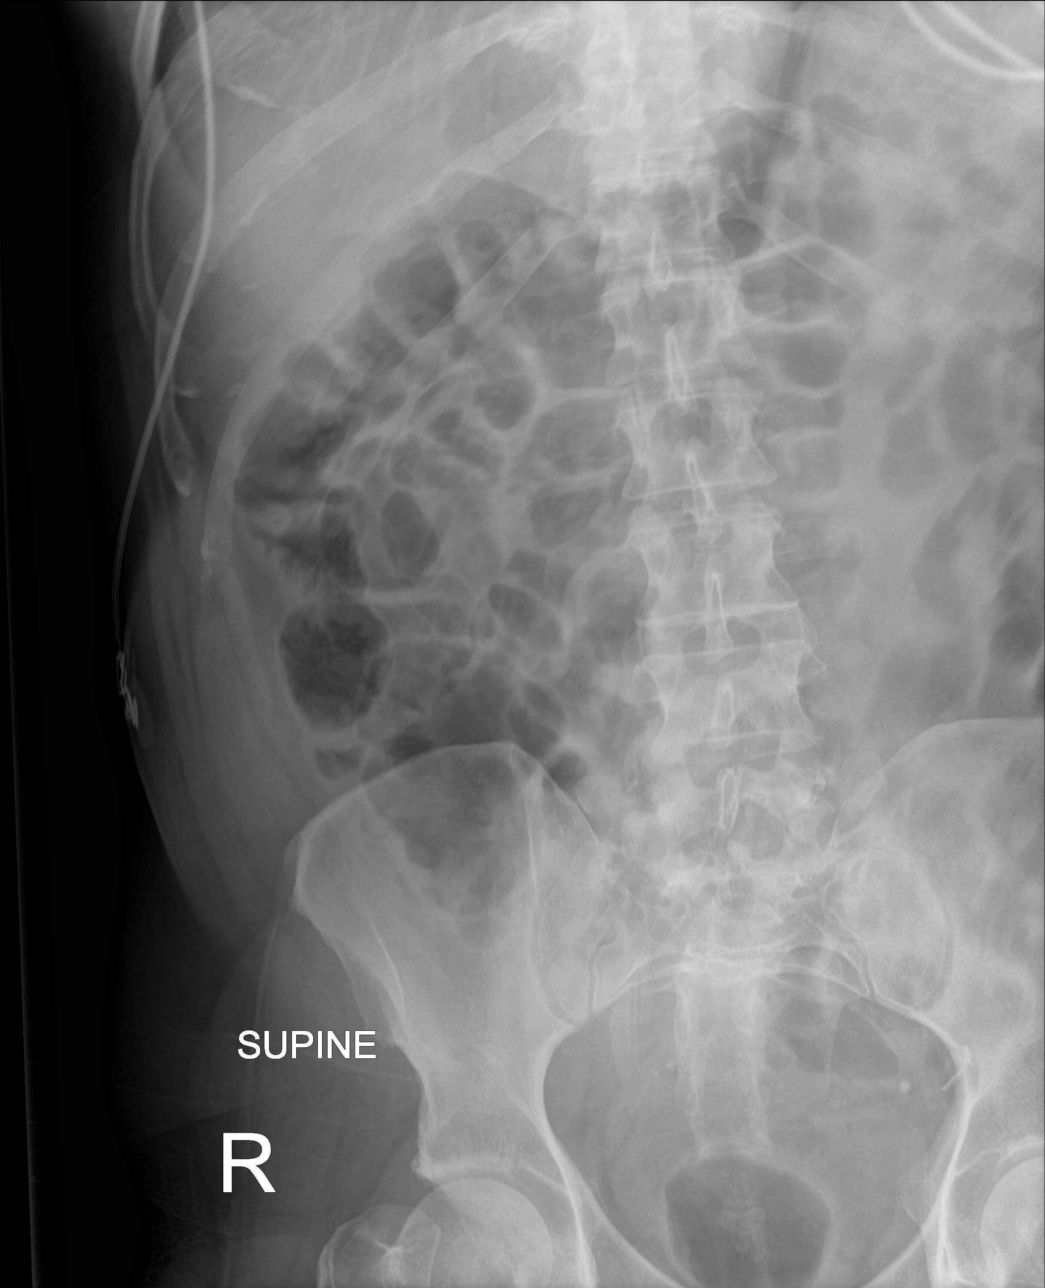

[chest ap]
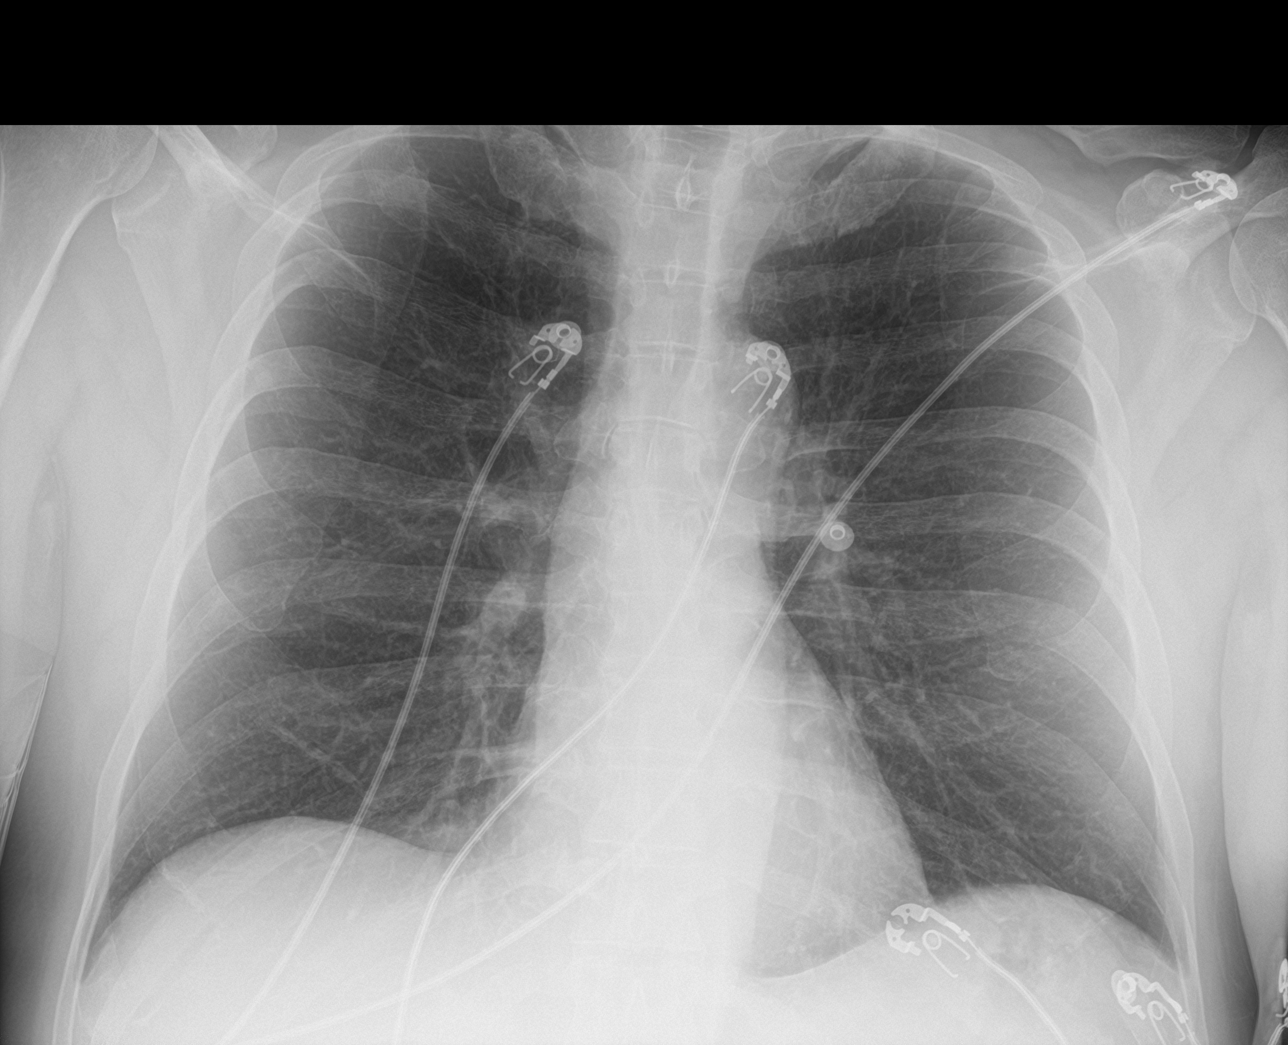

[4 of 4 positions shown; findings below may reference images not displayed]

FINDINGS: Single-view chest demonstrates no acute opacity or pleural effusion.
Normal heart size. No pneumothorax. Aortic atherosclerosis.

Supine and upright views of the abdomen demonstrate no free air
beneath the diaphragm. Mild diffuse increased bowel gas with gas
present in the colon and rectum. Postsurgical changes in the pelvis.
No radiopaque calculi.
IMPRESSION: 1. No radiographic evidence for acute cardiopulmonary abnormality.
2. Mild diffuse increased bowel gas without obstructive pattern,
question mild ileus

## 2020-01-05 DIAGNOSIS — F17210 Nicotine dependence, cigarettes, uncomplicated: Secondary | ICD-10-CM | POA: Diagnosis not present

## 2020-01-05 DIAGNOSIS — E559 Vitamin D deficiency, unspecified: Secondary | ICD-10-CM | POA: Diagnosis not present

## 2020-01-05 DIAGNOSIS — M129 Arthropathy, unspecified: Secondary | ICD-10-CM | POA: Diagnosis not present

## 2020-01-05 DIAGNOSIS — E119 Type 2 diabetes mellitus without complications: Secondary | ICD-10-CM | POA: Diagnosis not present

## 2020-01-05 DIAGNOSIS — I10 Essential (primary) hypertension: Secondary | ICD-10-CM | POA: Diagnosis not present

## 2020-01-05 DIAGNOSIS — E7800 Pure hypercholesterolemia, unspecified: Secondary | ICD-10-CM | POA: Diagnosis not present

## 2020-01-05 DIAGNOSIS — Z1159 Encounter for screening for other viral diseases: Secondary | ICD-10-CM | POA: Diagnosis not present

## 2020-01-05 DIAGNOSIS — D539 Nutritional anemia, unspecified: Secondary | ICD-10-CM | POA: Diagnosis not present

## 2020-01-05 DIAGNOSIS — Z79899 Other long term (current) drug therapy: Secondary | ICD-10-CM | POA: Diagnosis not present

## 2020-01-05 DIAGNOSIS — R5383 Other fatigue: Secondary | ICD-10-CM | POA: Diagnosis not present

## 2020-01-05 DIAGNOSIS — Z7689 Persons encountering health services in other specified circumstances: Secondary | ICD-10-CM | POA: Diagnosis not present

## 2020-01-05 DIAGNOSIS — N529 Male erectile dysfunction, unspecified: Secondary | ICD-10-CM | POA: Diagnosis not present

## 2020-01-05 DIAGNOSIS — R05 Cough: Secondary | ICD-10-CM | POA: Diagnosis not present

## 2020-01-05 DIAGNOSIS — Z0000 Encounter for general adult medical examination without abnormal findings: Secondary | ICD-10-CM | POA: Diagnosis not present

## 2020-01-14 DIAGNOSIS — M62830 Muscle spasm of back: Secondary | ICD-10-CM | POA: Diagnosis not present

## 2020-01-14 DIAGNOSIS — F17210 Nicotine dependence, cigarettes, uncomplicated: Secondary | ICD-10-CM | POA: Diagnosis not present

## 2020-01-14 DIAGNOSIS — M47817 Spondylosis without myelopathy or radiculopathy, lumbosacral region: Secondary | ICD-10-CM | POA: Diagnosis not present

## 2020-01-14 DIAGNOSIS — G629 Polyneuropathy, unspecified: Secondary | ICD-10-CM | POA: Diagnosis not present

## 2020-01-14 DIAGNOSIS — Z79891 Long term (current) use of opiate analgesic: Secondary | ICD-10-CM | POA: Diagnosis not present

## 2020-01-14 DIAGNOSIS — Z79899 Other long term (current) drug therapy: Secondary | ICD-10-CM | POA: Diagnosis not present

## 2020-02-15 DIAGNOSIS — F17210 Nicotine dependence, cigarettes, uncomplicated: Secondary | ICD-10-CM | POA: Diagnosis not present

## 2020-02-15 DIAGNOSIS — G629 Polyneuropathy, unspecified: Secondary | ICD-10-CM | POA: Diagnosis not present

## 2020-02-15 DIAGNOSIS — Z79899 Other long term (current) drug therapy: Secondary | ICD-10-CM | POA: Diagnosis not present

## 2020-02-15 DIAGNOSIS — Z79891 Long term (current) use of opiate analgesic: Secondary | ICD-10-CM | POA: Diagnosis not present

## 2020-02-15 DIAGNOSIS — M47817 Spondylosis without myelopathy or radiculopathy, lumbosacral region: Secondary | ICD-10-CM | POA: Diagnosis not present

## 2020-02-15 DIAGNOSIS — M62830 Muscle spasm of back: Secondary | ICD-10-CM | POA: Diagnosis not present

## 2020-03-16 DIAGNOSIS — Z79899 Other long term (current) drug therapy: Secondary | ICD-10-CM | POA: Diagnosis not present

## 2020-03-16 DIAGNOSIS — F17210 Nicotine dependence, cigarettes, uncomplicated: Secondary | ICD-10-CM | POA: Diagnosis not present

## 2020-03-16 DIAGNOSIS — M62830 Muscle spasm of back: Secondary | ICD-10-CM | POA: Diagnosis not present

## 2020-03-16 DIAGNOSIS — M47817 Spondylosis without myelopathy or radiculopathy, lumbosacral region: Secondary | ICD-10-CM | POA: Diagnosis not present

## 2020-03-16 DIAGNOSIS — G629 Polyneuropathy, unspecified: Secondary | ICD-10-CM | POA: Diagnosis not present

## 2020-03-16 DIAGNOSIS — Z79891 Long term (current) use of opiate analgesic: Secondary | ICD-10-CM | POA: Diagnosis not present

## 2020-03-16 DIAGNOSIS — Z23 Encounter for immunization: Secondary | ICD-10-CM | POA: Diagnosis not present

## 2020-04-13 DIAGNOSIS — M47817 Spondylosis without myelopathy or radiculopathy, lumbosacral region: Secondary | ICD-10-CM | POA: Diagnosis not present

## 2020-04-13 DIAGNOSIS — M62830 Muscle spasm of back: Secondary | ICD-10-CM | POA: Diagnosis not present

## 2020-04-13 DIAGNOSIS — Z79891 Long term (current) use of opiate analgesic: Secondary | ICD-10-CM | POA: Diagnosis not present

## 2020-04-13 DIAGNOSIS — Z79899 Other long term (current) drug therapy: Secondary | ICD-10-CM | POA: Diagnosis not present

## 2020-04-13 DIAGNOSIS — G629 Polyneuropathy, unspecified: Secondary | ICD-10-CM | POA: Diagnosis not present

## 2020-04-13 DIAGNOSIS — F17210 Nicotine dependence, cigarettes, uncomplicated: Secondary | ICD-10-CM | POA: Diagnosis not present

## 2020-05-13 DIAGNOSIS — M62830 Muscle spasm of back: Secondary | ICD-10-CM | POA: Diagnosis not present

## 2020-05-13 DIAGNOSIS — G629 Polyneuropathy, unspecified: Secondary | ICD-10-CM | POA: Diagnosis not present

## 2020-05-13 DIAGNOSIS — F17210 Nicotine dependence, cigarettes, uncomplicated: Secondary | ICD-10-CM | POA: Diagnosis not present

## 2020-05-13 DIAGNOSIS — Z79899 Other long term (current) drug therapy: Secondary | ICD-10-CM | POA: Diagnosis not present

## 2020-05-13 DIAGNOSIS — Z79891 Long term (current) use of opiate analgesic: Secondary | ICD-10-CM | POA: Diagnosis not present

## 2020-05-13 DIAGNOSIS — M47817 Spondylosis without myelopathy or radiculopathy, lumbosacral region: Secondary | ICD-10-CM | POA: Diagnosis not present

## 2020-05-23 DIAGNOSIS — R5383 Other fatigue: Secondary | ICD-10-CM | POA: Diagnosis not present

## 2020-05-23 DIAGNOSIS — Z20822 Contact with and (suspected) exposure to covid-19: Secondary | ICD-10-CM | POA: Diagnosis not present

## 2020-05-23 DIAGNOSIS — I10 Essential (primary) hypertension: Secondary | ICD-10-CM | POA: Diagnosis not present

## 2020-05-23 DIAGNOSIS — J309 Allergic rhinitis, unspecified: Secondary | ICD-10-CM | POA: Diagnosis not present

## 2020-05-23 DIAGNOSIS — F17210 Nicotine dependence, cigarettes, uncomplicated: Secondary | ICD-10-CM | POA: Diagnosis not present

## 2020-05-23 DIAGNOSIS — D539 Nutritional anemia, unspecified: Secondary | ICD-10-CM | POA: Diagnosis not present

## 2020-05-23 DIAGNOSIS — E119 Type 2 diabetes mellitus without complications: Secondary | ICD-10-CM | POA: Diagnosis not present

## 2020-05-23 DIAGNOSIS — N529 Male erectile dysfunction, unspecified: Secondary | ICD-10-CM | POA: Diagnosis not present

## 2020-05-23 DIAGNOSIS — R059 Cough, unspecified: Secondary | ICD-10-CM | POA: Diagnosis not present

## 2020-05-23 DIAGNOSIS — K219 Gastro-esophageal reflux disease without esophagitis: Secondary | ICD-10-CM | POA: Diagnosis not present

## 2020-05-23 DIAGNOSIS — M129 Arthropathy, unspecified: Secondary | ICD-10-CM | POA: Diagnosis not present

## 2020-05-23 DIAGNOSIS — E7800 Pure hypercholesterolemia, unspecified: Secondary | ICD-10-CM | POA: Diagnosis not present

## 2020-05-23 DIAGNOSIS — E559 Vitamin D deficiency, unspecified: Secondary | ICD-10-CM | POA: Diagnosis not present

## 2020-05-23 DIAGNOSIS — Z79899 Other long term (current) drug therapy: Secondary | ICD-10-CM | POA: Diagnosis not present

## 2020-06-10 DIAGNOSIS — G629 Polyneuropathy, unspecified: Secondary | ICD-10-CM | POA: Diagnosis not present

## 2020-06-10 DIAGNOSIS — M62830 Muscle spasm of back: Secondary | ICD-10-CM | POA: Diagnosis not present

## 2020-06-10 DIAGNOSIS — M47817 Spondylosis without myelopathy or radiculopathy, lumbosacral region: Secondary | ICD-10-CM | POA: Diagnosis not present

## 2020-06-10 DIAGNOSIS — Z79899 Other long term (current) drug therapy: Secondary | ICD-10-CM | POA: Diagnosis not present

## 2020-06-10 DIAGNOSIS — F17210 Nicotine dependence, cigarettes, uncomplicated: Secondary | ICD-10-CM | POA: Diagnosis not present

## 2020-06-10 DIAGNOSIS — Z79891 Long term (current) use of opiate analgesic: Secondary | ICD-10-CM | POA: Diagnosis not present

## 2020-07-08 DIAGNOSIS — F17210 Nicotine dependence, cigarettes, uncomplicated: Secondary | ICD-10-CM | POA: Diagnosis not present

## 2020-07-08 DIAGNOSIS — Z79899 Other long term (current) drug therapy: Secondary | ICD-10-CM | POA: Diagnosis not present

## 2020-07-08 DIAGNOSIS — M62830 Muscle spasm of back: Secondary | ICD-10-CM | POA: Diagnosis not present

## 2020-07-08 DIAGNOSIS — Z79891 Long term (current) use of opiate analgesic: Secondary | ICD-10-CM | POA: Diagnosis not present

## 2020-07-08 DIAGNOSIS — R1084 Generalized abdominal pain: Secondary | ICD-10-CM | POA: Diagnosis not present

## 2020-07-08 DIAGNOSIS — M47817 Spondylosis without myelopathy or radiculopathy, lumbosacral region: Secondary | ICD-10-CM | POA: Diagnosis not present

## 2020-07-08 DIAGNOSIS — G629 Polyneuropathy, unspecified: Secondary | ICD-10-CM | POA: Diagnosis not present

## 2020-07-13 DIAGNOSIS — Z01818 Encounter for other preprocedural examination: Secondary | ICD-10-CM | POA: Diagnosis not present

## 2020-07-13 DIAGNOSIS — K219 Gastro-esophageal reflux disease without esophagitis: Secondary | ICD-10-CM | POA: Diagnosis not present

## 2020-07-13 DIAGNOSIS — Z9229 Personal history of other drug therapy: Secondary | ICD-10-CM | POA: Diagnosis not present

## 2020-07-13 DIAGNOSIS — R1319 Other dysphagia: Secondary | ICD-10-CM | POA: Diagnosis not present

## 2020-07-18 DIAGNOSIS — Z01818 Encounter for other preprocedural examination: Secondary | ICD-10-CM | POA: Diagnosis not present

## 2020-07-18 DIAGNOSIS — E119 Type 2 diabetes mellitus without complications: Secondary | ICD-10-CM | POA: Diagnosis not present

## 2020-07-18 DIAGNOSIS — Z1211 Encounter for screening for malignant neoplasm of colon: Secondary | ICD-10-CM | POA: Diagnosis not present

## 2020-07-18 DIAGNOSIS — K219 Gastro-esophageal reflux disease without esophagitis: Secondary | ICD-10-CM | POA: Diagnosis not present

## 2020-07-18 DIAGNOSIS — R1319 Other dysphagia: Secondary | ICD-10-CM | POA: Diagnosis not present

## 2020-07-22 DIAGNOSIS — K2970 Gastritis, unspecified, without bleeding: Secondary | ICD-10-CM | POA: Diagnosis not present

## 2020-07-22 DIAGNOSIS — K900 Celiac disease: Secondary | ICD-10-CM | POA: Diagnosis not present

## 2020-07-22 DIAGNOSIS — A048 Other specified bacterial intestinal infections: Secondary | ICD-10-CM | POA: Diagnosis not present

## 2020-07-25 DIAGNOSIS — Q398 Other congenital malformations of esophagus: Secondary | ICD-10-CM | POA: Diagnosis not present

## 2020-07-25 DIAGNOSIS — K200 Eosinophilic esophagitis: Secondary | ICD-10-CM | POA: Diagnosis not present

## 2020-07-25 DIAGNOSIS — B3781 Candidal esophagitis: Secondary | ICD-10-CM | POA: Diagnosis not present

## 2020-07-29 ENCOUNTER — Telehealth (INDEPENDENT_AMBULATORY_CARE_PROVIDER_SITE_OTHER): Payer: PPO | Admitting: Psychiatry

## 2020-07-29 ENCOUNTER — Encounter (HOSPITAL_COMMUNITY): Payer: Self-pay | Admitting: Psychiatry

## 2020-07-29 ENCOUNTER — Other Ambulatory Visit: Payer: Self-pay

## 2020-07-29 VITALS — Wt 160.0 lb

## 2020-07-29 DIAGNOSIS — F3131 Bipolar disorder, current episode depressed, mild: Secondary | ICD-10-CM

## 2020-07-29 DIAGNOSIS — F419 Anxiety disorder, unspecified: Secondary | ICD-10-CM | POA: Diagnosis not present

## 2020-07-29 MED ORDER — MIRTAZAPINE 15 MG PO TABS: 15.0000 mg | ORAL_TABLET | Freq: Every day | ORAL | 0 refills | Status: DC

## 2020-07-29 MED ORDER — RISPERIDONE 1 MG PO TABS: ORAL_TABLET | ORAL | 0 refills | Status: DC
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
## 2020-08-05 DIAGNOSIS — Z79899 Other long term (current) drug therapy: Secondary | ICD-10-CM | POA: Diagnosis not present

## 2020-08-05 DIAGNOSIS — Z79891 Long term (current) use of opiate analgesic: Secondary | ICD-10-CM | POA: Diagnosis not present

## 2020-08-05 DIAGNOSIS — G629 Polyneuropathy, unspecified: Secondary | ICD-10-CM | POA: Diagnosis not present

## 2020-08-05 DIAGNOSIS — M47817 Spondylosis without myelopathy or radiculopathy, lumbosacral region: Secondary | ICD-10-CM | POA: Diagnosis not present

## 2020-08-05 DIAGNOSIS — M62830 Muscle spasm of back: Secondary | ICD-10-CM | POA: Diagnosis not present

## 2020-08-05 DIAGNOSIS — Z87891 Personal history of nicotine dependence: Secondary | ICD-10-CM | POA: Diagnosis not present

## 2020-08-05 DIAGNOSIS — F17210 Nicotine dependence, cigarettes, uncomplicated: Secondary | ICD-10-CM | POA: Diagnosis not present

## 2020-08-18 ENCOUNTER — Other Ambulatory Visit: Payer: Self-pay

## 2020-08-18 ENCOUNTER — Encounter (HOSPITAL_COMMUNITY): Payer: Self-pay | Admitting: Psychiatry

## 2020-08-18 ENCOUNTER — Telehealth (INDEPENDENT_AMBULATORY_CARE_PROVIDER_SITE_OTHER): Payer: PPO | Admitting: Psychiatry

## 2020-08-18 DIAGNOSIS — F419 Anxiety disorder, unspecified: Secondary | ICD-10-CM

## 2020-08-18 DIAGNOSIS — F3131 Bipolar disorder, current episode depressed, mild: Secondary | ICD-10-CM | POA: Diagnosis not present

## 2020-08-18 MED ORDER — MIRTAZAPINE 15 MG PO TABS: 15.0000 mg | ORAL_TABLET | Freq: Every day | ORAL | 0 refills | Status: DC

## 2020-08-18 MED ORDER — RISPERIDONE 1 MG PO TABS: ORAL_TABLET | ORAL | 0 refills | Status: DC
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
## 2020-08-22 DIAGNOSIS — I10 Essential (primary) hypertension: Secondary | ICD-10-CM | POA: Diagnosis not present

## 2020-08-22 DIAGNOSIS — Z20822 Contact with and (suspected) exposure to covid-19: Secondary | ICD-10-CM | POA: Diagnosis not present

## 2020-08-22 DIAGNOSIS — N529 Male erectile dysfunction, unspecified: Secondary | ICD-10-CM | POA: Diagnosis not present

## 2020-08-22 DIAGNOSIS — E7800 Pure hypercholesterolemia, unspecified: Secondary | ICD-10-CM | POA: Diagnosis not present

## 2020-08-22 DIAGNOSIS — E559 Vitamin D deficiency, unspecified: Secondary | ICD-10-CM | POA: Diagnosis not present

## 2020-08-22 DIAGNOSIS — R5383 Other fatigue: Secondary | ICD-10-CM | POA: Diagnosis not present

## 2020-08-22 DIAGNOSIS — F17210 Nicotine dependence, cigarettes, uncomplicated: Secondary | ICD-10-CM | POA: Diagnosis not present

## 2020-08-22 DIAGNOSIS — M129 Arthropathy, unspecified: Secondary | ICD-10-CM | POA: Diagnosis not present

## 2020-08-22 DIAGNOSIS — Z1159 Encounter for screening for other viral diseases: Secondary | ICD-10-CM | POA: Diagnosis not present

## 2020-08-22 DIAGNOSIS — R059 Cough, unspecified: Secondary | ICD-10-CM | POA: Diagnosis not present

## 2020-08-22 DIAGNOSIS — R8290 Unspecified abnormal findings in urine: Secondary | ICD-10-CM | POA: Diagnosis not present

## 2020-08-22 DIAGNOSIS — Z79899 Other long term (current) drug therapy: Secondary | ICD-10-CM | POA: Diagnosis not present

## 2020-08-22 DIAGNOSIS — R0602 Shortness of breath: Secondary | ICD-10-CM | POA: Diagnosis not present

## 2020-08-22 DIAGNOSIS — D539 Nutritional anemia, unspecified: Secondary | ICD-10-CM | POA: Diagnosis not present

## 2020-08-22 DIAGNOSIS — E119 Type 2 diabetes mellitus without complications: Secondary | ICD-10-CM | POA: Diagnosis not present

## 2020-08-22 DIAGNOSIS — J309 Allergic rhinitis, unspecified: Secondary | ICD-10-CM | POA: Diagnosis not present

## 2020-08-22 DIAGNOSIS — K219 Gastro-esophageal reflux disease without esophagitis: Secondary | ICD-10-CM | POA: Diagnosis not present

## 2020-08-24 DIAGNOSIS — R1110 Vomiting, unspecified: Secondary | ICD-10-CM | POA: Diagnosis not present

## 2020-08-24 DIAGNOSIS — K5909 Other constipation: Secondary | ICD-10-CM | POA: Diagnosis not present

## 2020-08-24 DIAGNOSIS — B3781 Candidal esophagitis: Secondary | ICD-10-CM | POA: Diagnosis not present

## 2020-08-24 DIAGNOSIS — R1084 Generalized abdominal pain: Secondary | ICD-10-CM | POA: Diagnosis not present

## 2020-08-30 DIAGNOSIS — Z87891 Personal history of nicotine dependence: Secondary | ICD-10-CM | POA: Diagnosis not present

## 2020-09-05 DIAGNOSIS — Z79899 Other long term (current) drug therapy: Secondary | ICD-10-CM | POA: Diagnosis not present

## 2020-09-05 DIAGNOSIS — Z79891 Long term (current) use of opiate analgesic: Secondary | ICD-10-CM | POA: Diagnosis not present

## 2020-09-05 DIAGNOSIS — G629 Polyneuropathy, unspecified: Secondary | ICD-10-CM | POA: Diagnosis not present

## 2020-09-05 DIAGNOSIS — M62830 Muscle spasm of back: Secondary | ICD-10-CM | POA: Diagnosis not present

## 2020-09-05 DIAGNOSIS — M47817 Spondylosis without myelopathy or radiculopathy, lumbosacral region: Secondary | ICD-10-CM | POA: Diagnosis not present

## 2020-09-05 DIAGNOSIS — F17210 Nicotine dependence, cigarettes, uncomplicated: Secondary | ICD-10-CM | POA: Diagnosis not present

## 2020-09-15 ENCOUNTER — Telehealth (INDEPENDENT_AMBULATORY_CARE_PROVIDER_SITE_OTHER): Payer: PPO | Admitting: Psychiatry

## 2020-09-15 ENCOUNTER — Encounter (HOSPITAL_COMMUNITY): Payer: Self-pay | Admitting: Psychiatry

## 2020-09-15 ENCOUNTER — Other Ambulatory Visit: Payer: Self-pay

## 2020-09-15 VITALS — Wt 170.0 lb

## 2020-09-15 DIAGNOSIS — F1210 Cannabis abuse, uncomplicated: Secondary | ICD-10-CM | POA: Diagnosis not present

## 2020-09-15 DIAGNOSIS — F419 Anxiety disorder, unspecified: Secondary | ICD-10-CM | POA: Diagnosis not present

## 2020-09-15 DIAGNOSIS — F3131 Bipolar disorder, current episode depressed, mild: Secondary | ICD-10-CM | POA: Diagnosis not present

## 2020-09-15 MED ORDER — MIRTAZAPINE 15 MG PO TABS: 15.0000 mg | ORAL_TABLET | Freq: Every day | ORAL | 1 refills | Status: DC

## 2020-09-15 MED ORDER — RISPERIDONE 1 MG PO TABS
ORAL_TABLET | ORAL | 1 refills | Status: DC
Start: 2020-09-15 — End: 2020-11-15
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
## 2020-10-05 DIAGNOSIS — M47817 Spondylosis without myelopathy or radiculopathy, lumbosacral region: Secondary | ICD-10-CM | POA: Diagnosis not present

## 2020-10-05 DIAGNOSIS — Z79899 Other long term (current) drug therapy: Secondary | ICD-10-CM | POA: Diagnosis not present

## 2020-10-05 DIAGNOSIS — G629 Polyneuropathy, unspecified: Secondary | ICD-10-CM | POA: Diagnosis not present

## 2020-10-05 DIAGNOSIS — F17210 Nicotine dependence, cigarettes, uncomplicated: Secondary | ICD-10-CM | POA: Diagnosis not present

## 2020-10-05 DIAGNOSIS — F1290 Cannabis use, unspecified, uncomplicated: Secondary | ICD-10-CM | POA: Diagnosis not present

## 2020-10-05 DIAGNOSIS — M62830 Muscle spasm of back: Secondary | ICD-10-CM | POA: Diagnosis not present

## 2020-10-14 DIAGNOSIS — R1110 Vomiting, unspecified: Secondary | ICD-10-CM | POA: Diagnosis not present

## 2020-11-02 DIAGNOSIS — R140 Abdominal distension (gaseous): Secondary | ICD-10-CM | POA: Diagnosis not present

## 2020-11-02 DIAGNOSIS — K5909 Other constipation: Secondary | ICD-10-CM | POA: Diagnosis not present

## 2020-11-02 DIAGNOSIS — R1110 Vomiting, unspecified: Secondary | ICD-10-CM | POA: Diagnosis not present

## 2020-11-07 DIAGNOSIS — M47817 Spondylosis without myelopathy or radiculopathy, lumbosacral region: Secondary | ICD-10-CM | POA: Diagnosis not present

## 2020-11-07 DIAGNOSIS — Z79899 Other long term (current) drug therapy: Secondary | ICD-10-CM | POA: Diagnosis not present

## 2020-11-07 DIAGNOSIS — M62830 Muscle spasm of back: Secondary | ICD-10-CM | POA: Diagnosis not present

## 2020-11-07 DIAGNOSIS — Z79891 Long term (current) use of opiate analgesic: Secondary | ICD-10-CM | POA: Diagnosis not present

## 2020-11-07 DIAGNOSIS — E663 Overweight: Secondary | ICD-10-CM | POA: Diagnosis not present

## 2020-11-07 DIAGNOSIS — G629 Polyneuropathy, unspecified: Secondary | ICD-10-CM | POA: Diagnosis not present

## 2020-11-07 DIAGNOSIS — F17210 Nicotine dependence, cigarettes, uncomplicated: Secondary | ICD-10-CM | POA: Diagnosis not present

## 2020-11-10 DIAGNOSIS — Z79899 Other long term (current) drug therapy: Secondary | ICD-10-CM | POA: Diagnosis not present

## 2020-11-15 ENCOUNTER — Telehealth (INDEPENDENT_AMBULATORY_CARE_PROVIDER_SITE_OTHER): Payer: PPO | Admitting: Psychiatry

## 2020-11-15 ENCOUNTER — Other Ambulatory Visit: Payer: Self-pay

## 2020-11-15 ENCOUNTER — Encounter (HOSPITAL_COMMUNITY): Payer: Self-pay | Admitting: Psychiatry

## 2020-11-15 VITALS — Wt 175.0 lb

## 2020-11-15 DIAGNOSIS — F1210 Cannabis abuse, uncomplicated: Secondary | ICD-10-CM

## 2020-11-15 DIAGNOSIS — F419 Anxiety disorder, unspecified: Secondary | ICD-10-CM | POA: Diagnosis not present

## 2020-11-15 DIAGNOSIS — F3131 Bipolar disorder, current episode depressed, mild: Secondary | ICD-10-CM | POA: Diagnosis not present

## 2020-11-15 MED ORDER — RISPERIDONE 1 MG PO TABS: ORAL_TABLET | ORAL | 2 refills | Status: DC

## 2020-11-15 MED ORDER — MIRTAZAPINE 15 MG PO TABS
15.0000 mg | ORAL_TABLET | Freq: Every day | ORAL | 2 refills | Status: DC
Start: 2020-11-15 — End: 2021-02-15
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
## 2020-12-07 DIAGNOSIS — E663 Overweight: Secondary | ICD-10-CM | POA: Diagnosis not present

## 2020-12-07 DIAGNOSIS — Z79899 Other long term (current) drug therapy: Secondary | ICD-10-CM | POA: Diagnosis not present

## 2020-12-07 DIAGNOSIS — G629 Polyneuropathy, unspecified: Secondary | ICD-10-CM | POA: Diagnosis not present

## 2020-12-07 DIAGNOSIS — M47817 Spondylosis without myelopathy or radiculopathy, lumbosacral region: Secondary | ICD-10-CM | POA: Diagnosis not present

## 2020-12-07 DIAGNOSIS — M62830 Muscle spasm of back: Secondary | ICD-10-CM | POA: Diagnosis not present

## 2020-12-07 DIAGNOSIS — Z79891 Long term (current) use of opiate analgesic: Secondary | ICD-10-CM | POA: Diagnosis not present

## 2020-12-07 DIAGNOSIS — F17210 Nicotine dependence, cigarettes, uncomplicated: Secondary | ICD-10-CM | POA: Diagnosis not present

## 2020-12-11 DIAGNOSIS — Z79899 Other long term (current) drug therapy: Secondary | ICD-10-CM | POA: Diagnosis not present

## 2021-01-09 DIAGNOSIS — F17210 Nicotine dependence, cigarettes, uncomplicated: Secondary | ICD-10-CM | POA: Diagnosis not present

## 2021-01-09 DIAGNOSIS — Z79899 Other long term (current) drug therapy: Secondary | ICD-10-CM | POA: Diagnosis not present

## 2021-01-09 DIAGNOSIS — E663 Overweight: Secondary | ICD-10-CM | POA: Diagnosis not present

## 2021-01-09 DIAGNOSIS — R1084 Generalized abdominal pain: Secondary | ICD-10-CM | POA: Diagnosis not present

## 2021-01-09 DIAGNOSIS — M47817 Spondylosis without myelopathy or radiculopathy, lumbosacral region: Secondary | ICD-10-CM | POA: Diagnosis not present

## 2021-01-09 DIAGNOSIS — G629 Polyneuropathy, unspecified: Secondary | ICD-10-CM | POA: Diagnosis not present

## 2021-01-09 DIAGNOSIS — M62830 Muscle spasm of back: Secondary | ICD-10-CM | POA: Diagnosis not present

## 2021-01-11 DIAGNOSIS — Z79899 Other long term (current) drug therapy: Secondary | ICD-10-CM | POA: Diagnosis not present

## 2021-02-08 DIAGNOSIS — E663 Overweight: Secondary | ICD-10-CM | POA: Diagnosis not present

## 2021-02-08 DIAGNOSIS — M47817 Spondylosis without myelopathy or radiculopathy, lumbosacral region: Secondary | ICD-10-CM | POA: Diagnosis not present

## 2021-02-08 DIAGNOSIS — G629 Polyneuropathy, unspecified: Secondary | ICD-10-CM | POA: Diagnosis not present

## 2021-02-08 DIAGNOSIS — Z79891 Long term (current) use of opiate analgesic: Secondary | ICD-10-CM | POA: Diagnosis not present

## 2021-02-08 DIAGNOSIS — M62830 Muscle spasm of back: Secondary | ICD-10-CM | POA: Diagnosis not present

## 2021-02-08 DIAGNOSIS — Z79899 Other long term (current) drug therapy: Secondary | ICD-10-CM | POA: Diagnosis not present

## 2021-02-13 DIAGNOSIS — Z79899 Other long term (current) drug therapy: Secondary | ICD-10-CM | POA: Diagnosis not present

## 2021-02-15 ENCOUNTER — Other Ambulatory Visit: Payer: Self-pay

## 2021-02-15 ENCOUNTER — Encounter (HOSPITAL_COMMUNITY): Payer: Self-pay | Admitting: Psychiatry

## 2021-02-15 ENCOUNTER — Telehealth (INDEPENDENT_AMBULATORY_CARE_PROVIDER_SITE_OTHER): Payer: PPO | Admitting: Psychiatry

## 2021-02-15 VITALS — Wt 179.0 lb

## 2021-02-15 DIAGNOSIS — F3131 Bipolar disorder, current episode depressed, mild: Secondary | ICD-10-CM | POA: Diagnosis not present

## 2021-02-15 DIAGNOSIS — F419 Anxiety disorder, unspecified: Secondary | ICD-10-CM | POA: Diagnosis not present

## 2021-02-15 DIAGNOSIS — F1210 Cannabis abuse, uncomplicated: Secondary | ICD-10-CM | POA: Diagnosis not present

## 2021-02-15 MED ORDER — MIRTAZAPINE 15 MG PO TABS: 15.0000 mg | ORAL_TABLET | Freq: Every day | ORAL | 2 refills | Status: DC

## 2021-02-15 MED ORDER — RISPERIDONE 1 MG PO TABS: ORAL_TABLET | ORAL | 2 refills | Status: DC
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
## 2021-03-07 ENCOUNTER — Telehealth (HOSPITAL_COMMUNITY): Payer: Self-pay

## 2021-03-07 NOTE — Telephone Encounter (Signed)
Yes thanks 

## 2021-03-07 NOTE — Telephone Encounter (Signed)
Patient called requesting a letter to be excused from jury duty. Is it ok to do letter? Please advise. Thank you

## 2021-03-08 ENCOUNTER — Encounter (HOSPITAL_COMMUNITY): Payer: Self-pay

## 2021-03-08 DIAGNOSIS — R109 Unspecified abdominal pain: Secondary | ICD-10-CM | POA: Diagnosis not present

## 2021-03-08 DIAGNOSIS — R1110 Vomiting, unspecified: Secondary | ICD-10-CM | POA: Diagnosis not present

## 2021-03-08 DIAGNOSIS — K5909 Other constipation: Secondary | ICD-10-CM | POA: Diagnosis not present

## 2021-03-08 NOTE — Telephone Encounter (Signed)
Letter completed. Patient stated that he's coming to the office to pick it up

## 2021-03-10 DIAGNOSIS — E663 Overweight: Secondary | ICD-10-CM | POA: Diagnosis not present

## 2021-03-10 DIAGNOSIS — M62830 Muscle spasm of back: Secondary | ICD-10-CM | POA: Diagnosis not present

## 2021-03-10 DIAGNOSIS — Z79891 Long term (current) use of opiate analgesic: Secondary | ICD-10-CM | POA: Diagnosis not present

## 2021-03-10 DIAGNOSIS — Z79899 Other long term (current) drug therapy: Secondary | ICD-10-CM | POA: Diagnosis not present

## 2021-03-10 DIAGNOSIS — M47817 Spondylosis without myelopathy or radiculopathy, lumbosacral region: Secondary | ICD-10-CM | POA: Diagnosis not present

## 2021-03-10 DIAGNOSIS — G629 Polyneuropathy, unspecified: Secondary | ICD-10-CM | POA: Diagnosis not present

## 2021-03-10 DIAGNOSIS — Z0000 Encounter for general adult medical examination without abnormal findings: Secondary | ICD-10-CM | POA: Diagnosis not present

## 2021-03-10 DIAGNOSIS — F17210 Nicotine dependence, cigarettes, uncomplicated: Secondary | ICD-10-CM | POA: Diagnosis not present

## 2021-03-14 DIAGNOSIS — Z79899 Other long term (current) drug therapy: Secondary | ICD-10-CM | POA: Diagnosis not present

## 2021-04-04 DIAGNOSIS — K219 Gastro-esophageal reflux disease without esophagitis: Secondary | ICD-10-CM | POA: Diagnosis not present

## 2021-04-04 DIAGNOSIS — M129 Arthropathy, unspecified: Secondary | ICD-10-CM | POA: Diagnosis not present

## 2021-04-04 DIAGNOSIS — Z1159 Encounter for screening for other viral diseases: Secondary | ICD-10-CM | POA: Diagnosis not present

## 2021-04-04 DIAGNOSIS — F32A Depression, unspecified: Secondary | ICD-10-CM | POA: Diagnosis not present

## 2021-04-04 DIAGNOSIS — Z125 Encounter for screening for malignant neoplasm of prostate: Secondary | ICD-10-CM | POA: Diagnosis not present

## 2021-04-04 DIAGNOSIS — J309 Allergic rhinitis, unspecified: Secondary | ICD-10-CM | POA: Diagnosis not present

## 2021-04-04 DIAGNOSIS — E7800 Pure hypercholesterolemia, unspecified: Secondary | ICD-10-CM | POA: Diagnosis not present

## 2021-04-04 DIAGNOSIS — Z79899 Other long term (current) drug therapy: Secondary | ICD-10-CM | POA: Diagnosis not present

## 2021-04-04 DIAGNOSIS — R5383 Other fatigue: Secondary | ICD-10-CM | POA: Diagnosis not present

## 2021-04-04 DIAGNOSIS — N529 Male erectile dysfunction, unspecified: Secondary | ICD-10-CM | POA: Diagnosis not present

## 2021-04-04 DIAGNOSIS — F17210 Nicotine dependence, cigarettes, uncomplicated: Secondary | ICD-10-CM | POA: Diagnosis not present

## 2021-04-04 DIAGNOSIS — D539 Nutritional anemia, unspecified: Secondary | ICD-10-CM | POA: Diagnosis not present

## 2021-04-04 DIAGNOSIS — E559 Vitamin D deficiency, unspecified: Secondary | ICD-10-CM | POA: Diagnosis not present

## 2021-04-04 DIAGNOSIS — E119 Type 2 diabetes mellitus without complications: Secondary | ICD-10-CM | POA: Diagnosis not present

## 2021-04-04 DIAGNOSIS — I10 Essential (primary) hypertension: Secondary | ICD-10-CM | POA: Diagnosis not present

## 2021-04-04 DIAGNOSIS — J449 Chronic obstructive pulmonary disease, unspecified: Secondary | ICD-10-CM | POA: Diagnosis not present

## 2021-04-11 DIAGNOSIS — Z79899 Other long term (current) drug therapy: Secondary | ICD-10-CM | POA: Diagnosis not present

## 2021-04-11 DIAGNOSIS — Z79891 Long term (current) use of opiate analgesic: Secondary | ICD-10-CM | POA: Diagnosis not present

## 2021-04-11 DIAGNOSIS — M47817 Spondylosis without myelopathy or radiculopathy, lumbosacral region: Secondary | ICD-10-CM | POA: Diagnosis not present

## 2021-04-11 DIAGNOSIS — E663 Overweight: Secondary | ICD-10-CM | POA: Diagnosis not present

## 2021-04-11 DIAGNOSIS — M62830 Muscle spasm of back: Secondary | ICD-10-CM | POA: Diagnosis not present

## 2021-04-11 DIAGNOSIS — F17210 Nicotine dependence, cigarettes, uncomplicated: Secondary | ICD-10-CM | POA: Diagnosis not present

## 2021-04-11 DIAGNOSIS — G629 Polyneuropathy, unspecified: Secondary | ICD-10-CM | POA: Diagnosis not present

## 2021-04-14 DIAGNOSIS — Z79899 Other long term (current) drug therapy: Secondary | ICD-10-CM | POA: Diagnosis not present

## 2021-05-05 ENCOUNTER — Other Ambulatory Visit: Payer: Self-pay

## 2021-05-05 ENCOUNTER — Encounter (HOSPITAL_COMMUNITY): Payer: Self-pay | Admitting: Psychiatry

## 2021-05-05 ENCOUNTER — Telehealth (HOSPITAL_BASED_OUTPATIENT_CLINIC_OR_DEPARTMENT_OTHER): Payer: PPO | Admitting: Psychiatry

## 2021-05-05 VITALS — Wt 175.0 lb

## 2021-05-05 DIAGNOSIS — F3131 Bipolar disorder, current episode depressed, mild: Secondary | ICD-10-CM

## 2021-05-05 DIAGNOSIS — F1210 Cannabis abuse, uncomplicated: Secondary | ICD-10-CM | POA: Diagnosis not present

## 2021-05-05 DIAGNOSIS — F419 Anxiety disorder, unspecified: Secondary | ICD-10-CM | POA: Diagnosis not present

## 2021-05-05 MED ORDER — MIRTAZAPINE 15 MG PO TABS: 15.0000 mg | ORAL_TABLET | Freq: Every day | ORAL | 2 refills | Status: DC

## 2021-05-05 MED ORDER — RISPERIDONE 1 MG PO TABS: ORAL_TABLET | ORAL | 2 refills | Status: DC
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
## 2021-05-11 ENCOUNTER — Telehealth (HOSPITAL_COMMUNITY): Payer: PPO | Admitting: Psychiatry

## 2021-05-11 DIAGNOSIS — Z79899 Other long term (current) drug therapy: Secondary | ICD-10-CM | POA: Diagnosis not present

## 2021-05-11 DIAGNOSIS — F1290 Cannabis use, unspecified, uncomplicated: Secondary | ICD-10-CM | POA: Diagnosis not present

## 2021-05-11 DIAGNOSIS — R109 Unspecified abdominal pain: Secondary | ICD-10-CM | POA: Diagnosis not present

## 2021-05-11 DIAGNOSIS — G629 Polyneuropathy, unspecified: Secondary | ICD-10-CM | POA: Diagnosis not present

## 2021-05-11 DIAGNOSIS — M47817 Spondylosis without myelopathy or radiculopathy, lumbosacral region: Secondary | ICD-10-CM | POA: Diagnosis not present

## 2021-05-11 DIAGNOSIS — E663 Overweight: Secondary | ICD-10-CM | POA: Diagnosis not present

## 2021-05-11 DIAGNOSIS — M62830 Muscle spasm of back: Secondary | ICD-10-CM | POA: Diagnosis not present

## 2021-05-11 DIAGNOSIS — F17210 Nicotine dependence, cigarettes, uncomplicated: Secondary | ICD-10-CM | POA: Diagnosis not present

## 2021-05-15 DIAGNOSIS — Z79899 Other long term (current) drug therapy: Secondary | ICD-10-CM | POA: Diagnosis not present

## 2021-08-11 ENCOUNTER — Encounter (HOSPITAL_COMMUNITY): Payer: Self-pay | Admitting: Psychiatry

## 2021-08-11 ENCOUNTER — Other Ambulatory Visit: Payer: Self-pay

## 2021-08-11 ENCOUNTER — Telehealth (HOSPITAL_BASED_OUTPATIENT_CLINIC_OR_DEPARTMENT_OTHER): Payer: PPO | Admitting: Psychiatry

## 2021-08-11 VITALS — Wt 172.0 lb

## 2021-08-11 DIAGNOSIS — F3131 Bipolar disorder, current episode depressed, mild: Secondary | ICD-10-CM | POA: Diagnosis not present

## 2021-08-11 DIAGNOSIS — F1210 Cannabis abuse, uncomplicated: Secondary | ICD-10-CM

## 2021-08-11 DIAGNOSIS — F419 Anxiety disorder, unspecified: Secondary | ICD-10-CM

## 2021-08-11 MED ORDER — RISPERIDONE 1 MG PO TABS: ORAL_TABLET | ORAL | 2 refills | Status: DC

## 2021-08-11 MED ORDER — MIRTAZAPINE 15 MG PO TABS: 15.0000 mg | ORAL_TABLET | Freq: Every day | ORAL | 2 refills | Status: DC
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
## 2021-11-14 ENCOUNTER — Encounter (HOSPITAL_COMMUNITY): Payer: Self-pay | Admitting: Psychiatry

## 2021-11-14 ENCOUNTER — Telehealth (HOSPITAL_BASED_OUTPATIENT_CLINIC_OR_DEPARTMENT_OTHER): Payer: PPO | Admitting: Psychiatry

## 2021-11-14 VITALS — Wt 180.0 lb

## 2021-11-14 DIAGNOSIS — F3131 Bipolar disorder, current episode depressed, mild: Secondary | ICD-10-CM | POA: Diagnosis not present

## 2021-11-14 DIAGNOSIS — F1210 Cannabis abuse, uncomplicated: Secondary | ICD-10-CM | POA: Diagnosis not present

## 2021-11-14 DIAGNOSIS — F419 Anxiety disorder, unspecified: Secondary | ICD-10-CM | POA: Diagnosis not present

## 2021-11-14 MED ORDER — RISPERIDONE 1 MG PO TABS: ORAL_TABLET | ORAL | 2 refills | Status: DC

## 2021-11-14 MED ORDER — MIRTAZAPINE 15 MG PO TABS: 15.0000 mg | ORAL_TABLET | Freq: Every day | ORAL | 2 refills | Status: DC
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
## 2022-02-05 ENCOUNTER — Encounter (HOSPITAL_COMMUNITY): Payer: Self-pay | Admitting: Psychiatry

## 2022-02-05 ENCOUNTER — Telehealth (HOSPITAL_BASED_OUTPATIENT_CLINIC_OR_DEPARTMENT_OTHER): Payer: PPO | Admitting: Psychiatry

## 2022-02-05 VITALS — Wt 175.0 lb

## 2022-02-05 DIAGNOSIS — F3131 Bipolar disorder, current episode depressed, mild: Secondary | ICD-10-CM | POA: Diagnosis not present

## 2022-02-05 DIAGNOSIS — F419 Anxiety disorder, unspecified: Secondary | ICD-10-CM

## 2022-02-05 DIAGNOSIS — F1210 Cannabis abuse, uncomplicated: Secondary | ICD-10-CM | POA: Diagnosis not present

## 2022-02-05 MED ORDER — RISPERIDONE 1 MG PO TABS: ORAL_TABLET | ORAL | 2 refills | Status: DC

## 2022-02-05 MED ORDER — MIRTAZAPINE 15 MG PO TABS: 15.0000 mg | ORAL_TABLET | Freq: Every day | ORAL | 2 refills | Status: DC
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
## 2022-05-14 ENCOUNTER — Telehealth (HOSPITAL_BASED_OUTPATIENT_CLINIC_OR_DEPARTMENT_OTHER): Payer: PPO | Admitting: Psychiatry

## 2022-05-14 ENCOUNTER — Encounter (HOSPITAL_COMMUNITY): Payer: Self-pay | Admitting: Psychiatry

## 2022-05-14 VITALS — Wt 190.0 lb

## 2022-05-14 DIAGNOSIS — F419 Anxiety disorder, unspecified: Secondary | ICD-10-CM

## 2022-05-14 DIAGNOSIS — F1210 Cannabis abuse, uncomplicated: Secondary | ICD-10-CM

## 2022-05-14 DIAGNOSIS — F3131 Bipolar disorder, current episode depressed, mild: Secondary | ICD-10-CM

## 2022-05-14 MED ORDER — MIRTAZAPINE 15 MG PO TABS: 15.0000 mg | ORAL_TABLET | Freq: Every day | ORAL | 2 refills | Status: DC

## 2022-05-14 MED ORDER — RISPERIDONE 1 MG PO TABS: ORAL_TABLET | ORAL | 2 refills | Status: DC
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
## 2022-08-13 ENCOUNTER — Telehealth (HOSPITAL_BASED_OUTPATIENT_CLINIC_OR_DEPARTMENT_OTHER): Payer: PPO | Admitting: Psychiatry

## 2022-08-13 ENCOUNTER — Encounter (HOSPITAL_COMMUNITY): Payer: Self-pay | Admitting: Psychiatry

## 2022-08-13 VITALS — Wt 185.0 lb

## 2022-08-13 DIAGNOSIS — F3131 Bipolar disorder, current episode depressed, mild: Secondary | ICD-10-CM | POA: Diagnosis not present

## 2022-08-13 DIAGNOSIS — F1210 Cannabis abuse, uncomplicated: Secondary | ICD-10-CM | POA: Diagnosis not present

## 2022-08-13 DIAGNOSIS — F419 Anxiety disorder, unspecified: Secondary | ICD-10-CM | POA: Diagnosis not present

## 2022-08-13 MED ORDER — MIRTAZAPINE 15 MG PO TABS: 15.0000 mg | ORAL_TABLET | Freq: Every day | ORAL | 2 refills | Status: DC

## 2022-08-13 MED ORDER — RISPERIDONE 1 MG PO TABS: ORAL_TABLET | ORAL | 2 refills | Status: DC
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
## 2022-11-12 ENCOUNTER — Encounter (HOSPITAL_COMMUNITY): Payer: Self-pay | Admitting: Psychiatry

## 2022-11-12 ENCOUNTER — Telehealth (HOSPITAL_BASED_OUTPATIENT_CLINIC_OR_DEPARTMENT_OTHER): Payer: PPO | Admitting: Psychiatry

## 2022-11-12 VITALS — Wt 183.0 lb

## 2022-11-12 DIAGNOSIS — F3131 Bipolar disorder, current episode depressed, mild: Secondary | ICD-10-CM

## 2022-11-12 DIAGNOSIS — F419 Anxiety disorder, unspecified: Secondary | ICD-10-CM | POA: Diagnosis not present

## 2022-11-12 DIAGNOSIS — F1210 Cannabis abuse, uncomplicated: Secondary | ICD-10-CM

## 2022-11-12 MED ORDER — MIRTAZAPINE 15 MG PO TABS
15.0000 mg | ORAL_TABLET | Freq: Every day | ORAL | 2 refills | Status: DC
Start: 2022-11-12 — End: 2023-02-11

## 2022-11-12 MED ORDER — RISPERIDONE 1 MG PO TABS
ORAL_TABLET | ORAL | 2 refills | Status: DC
Start: 2022-11-12 — End: 2023-02-11
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
## 2023-02-11 ENCOUNTER — Telehealth (HOSPITAL_BASED_OUTPATIENT_CLINIC_OR_DEPARTMENT_OTHER): Payer: PPO | Admitting: Psychiatry

## 2023-02-11 ENCOUNTER — Encounter (HOSPITAL_COMMUNITY): Payer: Self-pay | Admitting: Psychiatry

## 2023-02-11 VITALS — Wt 183.0 lb

## 2023-02-11 DIAGNOSIS — F3131 Bipolar disorder, current episode depressed, mild: Secondary | ICD-10-CM | POA: Diagnosis not present

## 2023-02-11 DIAGNOSIS — F419 Anxiety disorder, unspecified: Secondary | ICD-10-CM

## 2023-02-11 MED ORDER — RISPERIDONE 1 MG PO TABS
ORAL_TABLET | ORAL | 2 refills | Status: DC
Start: 2023-02-11 — End: 2023-05-16

## 2023-02-11 MED ORDER — MIRTAZAPINE 15 MG PO TABS
15.0000 mg | ORAL_TABLET | Freq: Every day | ORAL | 2 refills | Status: DC
Start: 2023-02-11 — End: 2023-05-16
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
## 2023-03-23 ENCOUNTER — Other Ambulatory Visit: Payer: Self-pay

## 2023-03-23 ENCOUNTER — Inpatient Hospital Stay (HOSPITAL_COMMUNITY)
Admission: EM | Admit: 2023-03-23 | Discharge: 2023-03-26 | DRG: 309 | Disposition: A | Discharge status: Home / Self Care | Payer: PPO | Attending: Family Medicine | Admitting: Family Medicine

## 2023-03-23 ENCOUNTER — Encounter (HOSPITAL_COMMUNITY): Payer: Self-pay

## 2023-03-23 ENCOUNTER — Emergency Department (HOSPITAL_COMMUNITY): Payer: PPO

## 2023-03-23 DIAGNOSIS — I4892 Unspecified atrial flutter: Secondary | ICD-10-CM | POA: Diagnosis present

## 2023-03-23 DIAGNOSIS — I5032 Chronic diastolic (congestive) heart failure: Secondary | ICD-10-CM | POA: Diagnosis present

## 2023-03-23 DIAGNOSIS — I4891 Unspecified atrial fibrillation: Secondary | ICD-10-CM | POA: Diagnosis not present

## 2023-03-23 DIAGNOSIS — Z7984 Long term (current) use of oral hypoglycemic drugs: Secondary | ICD-10-CM

## 2023-03-23 DIAGNOSIS — F319 Bipolar disorder, unspecified: Secondary | ICD-10-CM | POA: Diagnosis present

## 2023-03-23 DIAGNOSIS — Z79899 Other long term (current) drug therapy: Secondary | ICD-10-CM

## 2023-03-23 DIAGNOSIS — F1010 Alcohol abuse, uncomplicated: Secondary | ICD-10-CM | POA: Diagnosis not present

## 2023-03-23 DIAGNOSIS — K219 Gastro-esophageal reflux disease without esophagitis: Secondary | ICD-10-CM | POA: Diagnosis present

## 2023-03-23 DIAGNOSIS — I110 Hypertensive heart disease with heart failure: Secondary | ICD-10-CM | POA: Diagnosis present

## 2023-03-23 DIAGNOSIS — F17210 Nicotine dependence, cigarettes, uncomplicated: Secondary | ICD-10-CM | POA: Diagnosis present

## 2023-03-23 DIAGNOSIS — R110 Nausea: Principal | ICD-10-CM

## 2023-03-23 DIAGNOSIS — G4733 Obstructive sleep apnea (adult) (pediatric): Secondary | ICD-10-CM | POA: Diagnosis present

## 2023-03-23 DIAGNOSIS — E871 Hypo-osmolality and hyponatremia: Secondary | ICD-10-CM | POA: Diagnosis not present

## 2023-03-23 DIAGNOSIS — E785 Hyperlipidemia, unspecified: Secondary | ICD-10-CM | POA: Diagnosis present

## 2023-03-23 DIAGNOSIS — E1151 Type 2 diabetes mellitus with diabetic peripheral angiopathy without gangrene: Secondary | ICD-10-CM | POA: Diagnosis present

## 2023-03-23 DIAGNOSIS — G8929 Other chronic pain: Secondary | ICD-10-CM | POA: Diagnosis present

## 2023-03-23 DIAGNOSIS — I2510 Atherosclerotic heart disease of native coronary artery without angina pectoris: Secondary | ICD-10-CM | POA: Diagnosis present

## 2023-03-23 DIAGNOSIS — Z7901 Long term (current) use of anticoagulants: Secondary | ICD-10-CM | POA: Diagnosis not present

## 2023-03-23 DIAGNOSIS — R7401 Elevation of levels of liver transaminase levels: Secondary | ICD-10-CM | POA: Diagnosis not present

## 2023-03-23 DIAGNOSIS — Z8249 Family history of ischemic heart disease and other diseases of the circulatory system: Secondary | ICD-10-CM

## 2023-03-23 DIAGNOSIS — Z23 Encounter for immunization: Secondary | ICD-10-CM | POA: Diagnosis present

## 2023-03-23 DIAGNOSIS — Z91030 Bee allergy status: Secondary | ICD-10-CM | POA: Diagnosis not present

## 2023-03-23 DIAGNOSIS — I10 Essential (primary) hypertension: Secondary | ICD-10-CM | POA: Diagnosis present

## 2023-03-23 DIAGNOSIS — I480 Paroxysmal atrial fibrillation: Secondary | ICD-10-CM | POA: Diagnosis not present

## 2023-03-23 DIAGNOSIS — F1020 Alcohol dependence, uncomplicated: Secondary | ICD-10-CM | POA: Diagnosis present

## 2023-03-23 DIAGNOSIS — R112 Nausea with vomiting, unspecified: Secondary | ICD-10-CM

## 2023-03-23 LAB — COMPREHENSIVE METABOLIC PANEL
ALT: 80 U/L — ABNORMAL HIGH (ref 0–44)
AST: 87 U/L — ABNORMAL HIGH (ref 15–41)
Albumin: 4.2 g/dL (ref 3.5–5.0)
Alkaline Phosphatase: 77 U/L (ref 38–126)
Anion gap: 12 (ref 5–15)
BUN: 29 mg/dL — ABNORMAL HIGH (ref 6–20)
CO2: 25 mmol/L (ref 22–32)
Calcium: 9.4 mg/dL (ref 8.9–10.3)
Chloride: 95 mmol/L — ABNORMAL LOW (ref 98–111)
Creatinine, Ser: 1.24 mg/dL (ref 0.61–1.24)
GFR, Estimated: >60 mL/min (ref >60)
Glucose, Bld: 169 mg/dL — ABNORMAL HIGH (ref 70–99)
Potassium: 3.6 mmol/L (ref 3.5–5.1)
Sodium: 132 mmol/L — ABNORMAL LOW (ref 135–145)
Total Bilirubin: 1.6 mg/dL — ABNORMAL HIGH (ref 0.3–1.2)
Total Protein: 7.6 g/dL (ref 6.5–8.1)

## 2023-03-23 LAB — CBC WITH DIFFERENTIAL/PLATELET
Abs Immature Granulocytes: 0.03 10*3/uL (ref 0.00–0.07)
Basophils Absolute: 0.1 10*3/uL (ref 0.0–0.1)
Basophils Relative: 1 %
Eosinophils Absolute: 0.2 10*3/uL (ref 0.0–0.5)
Eosinophils Relative: 2 %
HCT: 44.5 % (ref 39.0–52.0)
Hemoglobin: 15.7 g/dL (ref 13.0–17.0)
Immature Granulocytes: 0 %
Lymphocytes Relative: 27 %
Lymphs Abs: 2.5 10*3/uL (ref 0.7–4.0)
MCH: 35.1 pg — ABNORMAL HIGH (ref 26.0–34.0)
MCHC: 35.3 g/dL (ref 30.0–36.0)
MCV: 99.6 fL (ref 80.0–100.0)
Monocytes Absolute: 0.5 10*3/uL (ref 0.1–1.0)
Monocytes Relative: 6 %
Neutro Abs: 5.8 10*3/uL (ref 1.7–7.7)
Neutrophils Relative %: 64 %
Platelets: 205 10*3/uL (ref 150–400)
RBC: 4.47 MIL/uL (ref 4.22–5.81)
RDW: 13.2 % (ref 11.5–15.5)
WBC: 9.1 10*3/uL (ref 4.0–10.5)
nRBC: 0 % (ref 0.0–0.2)

## 2023-03-23 LAB — MAGNESIUM: Magnesium: 1.8 mg/dL (ref 1.7–2.4)

## 2023-03-23 LAB — URINALYSIS, W/ REFLEX TO CULTURE (INFECTION SUSPECTED)
Bacteria, UA: NONE SEEN
Bilirubin Urine: NEGATIVE
Glucose, UA: NEGATIVE mg/dL
Hgb urine dipstick: NEGATIVE
Ketones, ur: NEGATIVE mg/dL
Leukocytes,Ua: NEGATIVE
Nitrite: NEGATIVE
Protein, ur: NEGATIVE mg/dL
Specific Gravity, Urine: 1.044 — ABNORMAL HIGH (ref 1.005–1.030)
pH: 5 (ref 5.0–8.0)

## 2023-03-23 LAB — TSH: TSH: 1.034 u[IU]/mL (ref 0.350–4.500)

## 2023-03-23 LAB — LIPASE, BLOOD: Lipase: 26 U/L (ref 11–51)

## 2023-03-23 LAB — HEMOGLOBIN A1C
Hgb A1c MFr Bld: 6 % — ABNORMAL HIGH (ref 4.8–5.6)
Mean Plasma Glucose: 125.5 mg/dL

## 2023-03-23 LAB — HIV ANTIBODY (ROUTINE TESTING W REFLEX): HIV Screen 4th Generation wRfx: NONREACTIVE

## 2023-03-23 LAB — GLUCOSE, CAPILLARY
Glucose-Capillary: 112 mg/dL — ABNORMAL HIGH (ref 70–99)
Glucose-Capillary: 106 mg/dL — ABNORMAL HIGH (ref 70–99)

## 2023-03-23 MED ORDER — ONDANSETRON HCL 4 MG/2ML IJ SOLN
4.0000 mg | Freq: Once | INTRAMUSCULAR | Status: AC
  Administered 2023-03-23: 4 mg via INTRAVENOUS
  Filled 2023-03-23: qty 2

## 2023-03-23 MED ORDER — DILTIAZEM HCL-DEXTROSE 125-5 MG/125ML-% IV SOLN (PREMIX)
5.0000 mg/h | INTRAVENOUS | Status: DC
  Administered 2023-03-23 – 2023-03-24 (×2): 5 mg/h via INTRAVENOUS
  Filled 2023-03-23 (×2): qty 125

## 2023-03-23 MED ORDER — PANTOPRAZOLE SODIUM 40 MG PO TBEC
40.0000 mg | DELAYED_RELEASE_TABLET | Freq: Every day | ORAL | Status: DC
  Administered 2023-03-23 – 2023-03-26 (×4): 40 mg via ORAL
  Filled 2023-03-23 (×4): qty 1

## 2023-03-23 MED ORDER — INSULIN ASPART 100 UNIT/ML IJ SOLN
0.0000 [IU] | Freq: Every day | INTRAMUSCULAR | Status: DC
  Filled 2023-03-23: qty 0.05

## 2023-03-23 MED ORDER — RISPERIDONE 0.5 MG PO TABS
1.0000 mg | ORAL_TABLET | Freq: Every day | ORAL | Status: DC
  Administered 2023-03-23 – 2023-03-25 (×3): 1 mg via ORAL
  Filled 2023-03-23 (×3): qty 2

## 2023-03-23 MED ORDER — APIXABAN 5 MG PO TABS
5.0000 mg | ORAL_TABLET | Freq: Two times a day (BID) | ORAL | Status: DC
  Administered 2023-03-23 – 2023-03-26 (×6): 5 mg via ORAL
  Filled 2023-03-23 (×7): qty 1

## 2023-03-23 MED ORDER — MIRTAZAPINE 15 MG PO TABS
15.0000 mg | ORAL_TABLET | Freq: Every day | ORAL | Status: DC
  Administered 2023-03-23 – 2023-03-25 (×3): 15 mg via ORAL
  Filled 2023-03-23: qty 1
  Filled 2023-03-23 (×2): qty 2
  Filled 2023-03-23: qty 1

## 2023-03-23 MED ORDER — ACETAMINOPHEN 650 MG RE SUPP: 650.0000 mg | Freq: Four times a day (QID) | RECTAL | Status: DC | PRN

## 2023-03-23 MED ORDER — ONDANSETRON HCL 4 MG/2ML IJ SOLN: 4.0000 mg | Freq: Four times a day (QID) | INTRAMUSCULAR | Status: DC | PRN

## 2023-03-23 MED ORDER — ACETAMINOPHEN 325 MG PO TABS: 650.0000 mg | ORAL_TABLET | Freq: Four times a day (QID) | ORAL | Status: DC | PRN

## 2023-03-23 MED ORDER — IOHEXOL 300 MG/ML  SOLN
100.0000 mL | Freq: Once | INTRAMUSCULAR | Status: AC | PRN
  Administered 2023-03-23: 100 mL via INTRAVENOUS

## 2023-03-23 MED ORDER — SODIUM CHLORIDE 0.9 % IV BOLUS
500.0000 mL | Freq: Once | INTRAVENOUS | Status: AC
  Administered 2023-03-23: 500 mL via INTRAVENOUS

## 2023-03-23 MED ORDER — DILTIAZEM HCL 25 MG/5ML IV SOLN
10.0000 mg | Freq: Once | INTRAVENOUS | Status: AC
  Administered 2023-03-23: 10 mg via INTRAVENOUS
  Filled 2023-03-23: qty 5

## 2023-03-23 MED ORDER — POTASSIUM CHLORIDE CRYS ER 20 MEQ PO TBCR
40.0000 meq | EXTENDED_RELEASE_TABLET | Freq: Once | ORAL | Status: AC
  Administered 2023-03-23: 40 meq via ORAL
  Filled 2023-03-23: qty 2

## 2023-03-23 MED ORDER — INSULIN ASPART 100 UNIT/ML IJ SOLN
0.0000 [IU] | Freq: Three times a day (TID) | INTRAMUSCULAR | Status: DC
  Administered 2023-03-24: 2 [IU] via SUBCUTANEOUS
  Administered 2023-03-24: 3 [IU] via SUBCUTANEOUS
  Administered 2023-03-25: 2 [IU] via SUBCUTANEOUS
  Filled 2023-03-23: qty 0.15

## 2023-03-23 MED ORDER — ONDANSETRON HCL 4 MG PO TABS: 4.0000 mg | ORAL_TABLET | Freq: Four times a day (QID) | ORAL | Status: DC | PRN

## 2023-03-23 MED ORDER — ALBUTEROL SULFATE (2.5 MG/3ML) 0.083% IN NEBU: 2.5000 mg | INHALATION_SOLUTION | RESPIRATORY_TRACT | Status: DC | PRN

## 2023-03-23 MED ORDER — SODIUM CHLORIDE 0.9 % IV BOLUS
1000.0000 mL | Freq: Once | INTRAVENOUS | Status: AC
  Administered 2023-03-23: 1000 mL via INTRAVENOUS

## 2023-03-23 NOTE — ED Provider Notes (Signed)
[place] EMERGENCY DEPARTMENT AT [facility] Provider Note   CSN: [id number] Arrival date & time: 03/23/23  7829     History  Chief Complaint  Patient presents with   Nausea    Lucas Small is a 57 y.o. male.  57 year old male with prior medical history as detailed below presents for evaluation.  Patient presents with primary complaint of persistent nausea and vomiting.  Symptoms been ongoing for several days.  He reports increased nausea and difficulty with p.o. intake over the last 3 days.  Patient reports multiple prior abdominal surgeries.  He denies associated fever, constipation, diarrhea.  He denies chest pain or shortness of breath.  He denies palpitations.  The history is provided by the patient and medical records.       Home Medications Prior to Admission medications   Medication Sig Start Date End Date Taking? Authorizing Provider  Buprenorphine HCl-Naloxone HCl 8-2 MG FILM Place under the tongue 4 (four) times daily. 10/26/22   [name], NP  cetirizine (ZYRTEC) 10 MG tablet Take 10 mg by mouth at bedtime. 09/18/22   [name], NP  cyclobenzaprine (FLEXERIL) 10 MG tablet Take 1 tablet (10 mg total) by mouth at bedtime. 10/03/17   Kirsteins, Victorino Sparrow, MD  fenofibrate micronized (LOFIBRA) 200 MG capsule Take 200 mg by mouth daily. 11/08/22   [name], NP  gabapentin (NEURONTIN) 300 MG capsule Take 1 capsule (300 mg total) by mouth 3 (three) times daily. Patient taking differently: Take 300 mg by mouth at bedtime as needed. 12/12/17   Donato Schultz, DO  mirtazapine (REMERON) 15 MG tablet Take 1 tablet (15 mg total) by mouth at bedtime. 02/11/23 05/12/23  Arfeen, Phillips Grout, MD  pantoprazole (PROTONIX) 40 MG tablet Take 1 tablet (40 mg total) by mouth daily. 09/06/17   Rachael Fee, MD  risperiDONE (RISPERDAL) 1 MG tablet Take one tab at bed time. 02/11/23   Arfeen, Phillips Grout, MD  Vitamin D, Ergocalciferol, (DRISDOL) 1.25 MG (50000 UNIT)  CAPS capsule Take 50,000 Units by mouth 2 (two) times a week. 04/11/21   [name], NP      Allergies    Bee venom    Review of Systems   Review of Systems  All other systems reviewed and are negative.   Physical Exam Updated Vital Signs BP 128/89   Pulse 92   Temp (!) 97.5 F (36.4 C) (Oral)   Resp 20   Ht 5\' 8"  (1.727 m)   Wt 83 kg   SpO2 96%   BMI 27.82 kg/m  Physical Exam Vitals and nursing note reviewed.  Constitutional:      General: He is not in acute distress.    Appearance: Normal appearance. He is well-developed.  HENT:     Head: Normocephalic and atraumatic.  Eyes:     Conjunctiva/sclera: Conjunctivae normal.     Pupils: Pupils are equal, round, and reactive to light.  Cardiovascular:     Rate and Rhythm: Regular rhythm. Tachycardia present.     Heart sounds: Normal heart sounds.  Pulmonary:     Effort: Pulmonary effort is normal. No respiratory distress.     Breath sounds: Normal breath sounds.  Abdominal:     General: There is no distension.     Palpations: Abdomen is soft.     Tenderness: There is no abdominal tenderness.  Musculoskeletal:        General: No deformity. Normal range of motion.  Cervical back: Normal range of motion and neck supple.  Skin:    General: Skin is warm and dry.  Neurological:     General: No focal deficit present.     Mental Status: He is alert and oriented to person, place, and time.     ED Results / Procedures / Treatments   Labs (all labs ordered are listed, but only abnormal results are displayed) Labs Reviewed  COMPREHENSIVE METABOLIC PANEL - Abnormal; Notable for the following components:      Result Value   Sodium 132 (*)    Chloride 95 (*)    Glucose, Bld 169 (*)    BUN 29 (*)    AST 87 (*)    ALT 80 (*)    Total Bilirubin 1.6 (*)    All other components within normal limits  CBC WITH DIFFERENTIAL/PLATELET - Abnormal; Notable for the following components:   MCH 35.1 (*)    All other  components within normal limits  LIPASE, BLOOD  URINALYSIS, W/ REFLEX TO CULTURE (INFECTION SUSPECTED)    EKG EKG Interpretation Date/Time:  Saturday March 23 2023 09:02:27 EDT Ventricular Rate:  111 PR Interval:  137 QRS Duration:  88 QT Interval:  328 QTC Calculation: 446 R Axis:   95  Text Interpretation: Sinus tachycardia Borderline right axis deviation Confirmed by Kristine Royal [phone]) on 03/23/2023 10:14:31 AM  Radiology CT ABDOMEN PELVIS W CONTRAST  Result Date: 03/23/2023 CLINICAL DATA:  LEFT LEFT-SIDED ABDOMINAL PAIN, NAUSEA AND VOMITING. EXAM: CT ABDOMEN AND PELVIS WITH CONTRAST TECHNIQUE: Multidetector CT imaging of the abdomen and pelvis was performed using the standard protocol following bolus administration of intravenous contrast. RADIATION DOSE REDUCTION: This exam was performed according to the departmental dose-optimization program which includes automated exposure control, adjustment of the mA and/or kV according to patient size and/or use of iterative reconstruction technique. CONTRAST:  OMNIPAQUE IOHEXOL 300 MG/ML  SOLN COMPARISON:  CT OF THE ABDOMEN AND PELVIS WITH CONTRAST 05/23/2018 FINDINGS: Lower chest: The lung bases are clear without focal nodule, mass, or airspace disease. The heart size is normal. No significant pleural or pericardial effusion is present. Hepatobiliary: Diffuse fatty infiltration of the liver is present. No discrete lesions are present. The common bile duct and gallbladder are normal. Pancreas: Unremarkable. No pancreatic ductal dilatation or surrounding inflammatory changes. Spleen: Normal in size without focal abnormality. Adrenals/Urinary Tract: Adrenal glands are unremarkable. Kidneys are normal, without renal calculi, focal lesion, or hydronephrosis. Bladder is unremarkable. Stomach/Bowel: Stomach is within normal limits. Appendix appears normal. No evidence of bowel wall thickening, distention, or inflammatory changes. The distal  colonic anastomosis is intact. Presacral soft tissue stranding or scarring and calcification is stable. Vascular/Lymphatic: Atherosclerotic calcifications are present at the aorta branch vessels. No aneurysm is present. No significant adenopathy is present. Reproductive: Choose 3 seminal vesicles are within normal limits. A prominent left-sided hydrocele is present. Other: Fat herniates into the left inguinal canal without associated bowel. No other significant ventral hernias are present. No free fluid or free air is present. Musculoskeletal: The vertebral body heights and alignment are normal. Straightening of the normal lumbar lordosis is present. No focal osseous lesions are present. Pelvis is within normal limits. The hips are located. Mild degenerative changes are present both hips. IMPRESSION: 1. No acute or focal lesion to explain the patient's symptoms. 2. Hepatic steatosis. 3. Prominent left-sided hydrocele. 4. Fat herniates into the left inguinal canal without associated bowel. 5. Mild degenerative changes of both hips. 6.  Aortic  Atherosclerosis (ICD10-I70.0). Electronically Signed   By: Marin Roberts M.D.   On: 03/23/2023 11:27    Procedures Procedures    Medications Ordered in ED Medications  sodium chloride 0.9 % bolus 500 mL (0 mLs Intravenous Stopped 03/23/23 0938)  ondansetron (ZOFRAN) injection 4 mg (4 mg Intravenous Given 03/23/23 0904)  sodium chloride 0.9 % bolus 1,000 mL (0 mLs Intravenous Stopped 03/23/23 1128)  iohexol (OMNIPAQUE) 300 MG/ML solution 100 mL (100 mLs Intravenous Contrast Given 03/23/23 1017)    ED Course/ Medical Decision Making/ A&P                                 Medical Decision Making Amount and/or Complexity of Data Reviewed Labs: ordered. Radiology: ordered.  Risk Prescription drug management.    Medical Screen Complete  This patient presented to the ED with complaint of nausea, vomiting, abdominal discomfort.  This complaint  involves an extensive number of treatment options. The initial differential diagnosis includes, but is not limited to, bowel obstruction, metabolic abnormality, etc.  This presentation is: Acute, Chronic, Self-Limited, Previously Undiagnosed, Uncertain Prognosis, Complicated, Systemic Symptoms, and Threat to Life/Bodily Function  Patient is presenting with primary complaint of nausea, vomiting, abdominal discomfort.  Symptoms improved with use of antiemetics and IV fluids.  Screening labs and CT imaging did not reveal significant intra-abdominal pathology.  Incidentally during workup patient was noted to be tachycardic.  After IV fluids tachycardia increased and patient was in A-fib with RVR.  Patient without prior history of A-fib with RVR.  Patient is not currently anticoagulated.  Patient with minimal to no symptoms despite heart rate into the 180s.  Blood pressure is well-preserved.  No conversion into normal sinus rhythm with push dose Cardizem.   Mild improvement of rate into the 130s-140s. Cardizem drip initiated for rate control.  Patient would benefit from admission for new onset A-fib with RVR.  Hospitalist service made aware of case and will evaluate for admission.  Additional history obtained: External records from outside sources obtained and reviewed including prior ED visits and prior Inpatient records.    Lab Tests:  I ordered and personally interpreted labs.  The pertinent results include:  CBC CMP Lipase UA   Imaging Studies ordered:  I ordered imaging studies including CT AP  I independently visualized and interpreted obtained imaging which showed NAD I agree with the radiologist interpretation.   Cardiac Monitoring:  The patient was maintained on a cardiac monitor.  I personally viewed and interpreted the cardiac monitor which showed an underlying rhythm of: Sinus tach, AFIB with RVR   Medicines ordered:  I ordered medication including Zofran, IV fluids,  Cardizem for nausea, A-fib Reevaluation of the patient after these medicines showed that the patient: improved    Problem List / ED Course:  Nausea, vomiting, A-fib with RVR   Reevaluation:  After the interventions noted above, I reevaluated the patient and found that they have: improved  Disposition:  After consideration of the diagnostic results and the patients response to treatment, I feel that the patent would benefit from admission.          Final Clinical Impression(s) / ED Diagnoses Final diagnoses:  Nausea  Nausea and vomiting, unspecified vomiting type  Atrial fibrillation, unspecified type [facility])    Rx / DC Orders ED Discharge Orders     None         Wynetta [name], MD 03/23/23 1309

## 2023-03-23 NOTE — Progress Notes (Signed)
1710 Pt asked  " Did the previous nurse mention I need Norco for bedtime for sleep" I told pt I would check with her and his med rec. FUP on med rec and RN, Awanda Mink is not listed. Asked pt more regarding Norco use. He said "I use it for sleep and body parts missing like my stomach" Educated pt that he has Remeron to help with sleep and Norco is not listed on your home medication rec. No C/O pain at this time

## 2023-03-23 NOTE — Discharge Instructions (Signed)

## 2023-03-23 NOTE — ED Triage Notes (Signed)
Patient reports nausea and vomiting x 2 weeks, but has worsened over the last 3 days. Patient has extensive abdominal surgery hx. Also has hx of "needing his throat stretched." Denies fevers, constipation, and diarrhea.

## 2023-03-23 NOTE — H&P (Signed)
History and Physical  [name]:[id number] DOB: 19-Apr-1966 DOA: 03/23/2023  PCP: [facility], Lucas Small   Chief Complaint: Nausea vomiting  HPI: Lucas Small is a 57 y.o. male with Small history significant for congestive heart failure, non-insulin-dependent diabetes, depression, hypertension being admitted to the hospital service with new onset atrial fibrillation with RVR.  Patient presented to the emergency department with several days of nausea and vomiting.  He denied any palpitations, chest pain, dizziness, fevers, chills, diarrhea.  Evaluation in the emergency department as below showed no acute abdominal process, he felt better and was able to tolerate an oral diet.  However, while in the emergency department he was noted to become more tachycardic, A-fib with RVR was noted on the monitor.  He has no prior history of this, though he says that when he was hospitalized for a couple of months in [place] many years ago, he did have some problems with heart failure and some other problems that he is not aware of.  He also states that when he was discharged from the hospital, he was on a blood thinner for just a couple of weeks, had a clot somewhere not sure where.  He denies any dizziness, chest pain, although intermittently he did feel like his heart was racing but if he rested it would just get better.  He was placed on IV Cardizem drip, and it looks like he was just converted back to sinus rhythm.  Review of Systems: Please see HPI for pertinent positives and negatives. A complete 10 system review of systems are otherwise negative.  Past Small History:  Diagnosis Date   Alcoholic (HCC)    CHF (congestive heart failure) (HCC)    Depression    Diabetes mellitus without complication (HCC)    metformin   Drug-seeking behavior    Dyspnea    with exertion .   Dysrhythmia    while hospitalized in [place]   Gastric rupture    History of kidney stones    Hypertension     MRSA (methicillin resistant Staphylococcus aureus)    Multiple myeloma (HCC) 1990's   Perforation bowel (HCC)    PNA (pneumonia)    Renal disorder    Renal failure    Renal insufficiency    Respiratory failure (HCC)    Sickle cell anemia (HCC)    Sleep apnea    no CPAP machine   Past Surgical History:  Procedure Laterality Date   ABDOMINAL SURGERY     APPENDECTOMY     CARDIAC SURGERY     HERNIA REPAIR     ILEOSTOMY     PERIPHERALLY INSERTED CENTRAL CATHETER INSERTION      Social History:  reports that he has been smoking cigarettes. He has a 42 pack-year smoking history. He has never used smokeless tobacco. He reports that he does not currently use alcohol. He reports that he does not use drugs.   Allergies  Allergen Reactions   Bee Venom Anaphylaxis    Family History  Problem Relation Age of Onset   Heart disease Mother    Heart failure Mother    Colon cancer Neg Hx    Esophageal cancer Neg Hx    Pancreatic cancer Neg Hx    Stomach cancer Neg Hx    Liver cancer Neg Hx      Prior to Admission medications   Medication Sig Start Date End Date Taking? Authorizing Provider  Buprenorphine HCl-Naloxone HCl 8-2 MG FILM Place under the tongue 4 (  four) times daily. 10/26/22   [name], NP  cetirizine (ZYRTEC) 10 MG tablet Take 10 mg by mouth at bedtime. 09/18/22   [name], NP  cyclobenzaprine (FLEXERIL) 10 MG tablet Take 1 tablet (10 mg total) by mouth at bedtime. 10/03/17   Kirsteins, Victorino Sparrow, MD  fenofibrate micronized (LOFIBRA) 200 MG capsule Take 200 mg by mouth daily. 11/08/22   [name], NP  gabapentin (NEURONTIN) 300 MG capsule Take 1 capsule (300 mg total) by mouth 3 (three) times daily. Patient taking differently: Take 300 mg by mouth at bedtime as needed. 12/12/17   Donato Schultz, DO  mirtazapine (REMERON) 15 MG tablet Take 1 tablet (15 mg total) by mouth at bedtime. 02/11/23 05/12/23  Arfeen, Phillips Grout, MD  pantoprazole (PROTONIX) 40 MG tablet Take 1  tablet (40 mg total) by mouth daily. 09/06/17   Rachael Fee, MD  risperiDONE (RISPERDAL) 1 MG tablet Take one tab at bed time. 02/11/23   Arfeen, Phillips Grout, MD  Vitamin D, Ergocalciferol, (DRISDOL) 1.25 MG (50000 UNIT) CAPS capsule Take 50,000 Units by mouth 2 (two) times a week. 04/11/21   [name], NP    Physical Exam: BP (!) 145/104   Pulse (!) 101   Temp 98.2 F (36.8 C) (Oral)   Resp 17   Ht 5\' 8"  (1.727 m)   Wt 83 kg   SpO2 94%   BMI 27.82 kg/m   General:  Alert, oriented, calm, in no acute distress  Eyes: EOMI, clear conjuctivae, white sclerea Neck: supple, no masses, trachea mildline  Cardiovascular: RRR, no murmurs or rubs, no peripheral edema  Respiratory: clear to auscultation bilaterally, no wheezes, no crackles  Abdomen: soft, nontender, nondistended, normal bowel tones heard  Skin: dry, no rashes  Musculoskeletal: no joint effusions, normal range of motion  Psychiatric: appropriate affect, normal speech  Neurologic: extraocular muscles intact, clear speech, moving all extremities with intact sensorium         Labs on Admission:  Basic Metabolic Panel: Recent Labs  Lab 03/23/23 0908  NA 132*  K 3.6  CL 95*  CO2 25  GLUCOSE 169*  BUN 29*  CREATININE 1.24  CALCIUM 9.4   Liver Function Tests: Recent Labs  Lab 03/23/23 0908  AST 87*  ALT 80*  ALKPHOS 77  BILITOT 1.6*  PROT 7.6  ALBUMIN 4.2   Recent Labs  Lab 03/23/23 0908  LIPASE 26   No results for input(s): "AMMONIA" in the last 168 hours. CBC: Recent Labs  Lab 03/23/23 0908  WBC 9.1  NEUTROABS 5.8  HGB 15.7  HCT 44.5  MCV 99.6  PLT 205   Cardiac Enzymes: No results for input(s): "CKTOTAL", "CKMB", "CKMBINDEX", "TROPONINI" in the last 168 hours.  BNP (last 3 results) No results for input(s): "BNP" in the last 8760 hours.  ProBNP (last 3 results) No results for input(s): "PROBNP" in the last 8760 hours.  CBG: No results for input(s): "GLUCAP" in the last 168  hours.  Radiological Exams on Admission: CT ABDOMEN PELVIS W CONTRAST  Result Date: 03/23/2023 CLINICAL DATA:  LEFT LEFT-SIDED ABDOMINAL PAIN, NAUSEA AND VOMITING. EXAM: CT ABDOMEN AND PELVIS WITH CONTRAST TECHNIQUE: Multidetector CT imaging of the abdomen and pelvis was performed using the standard protocol following bolus administration of intravenous contrast. RADIATION DOSE REDUCTION: This exam was performed according to the departmental dose-optimization program which includes automated exposure control, adjustment of the mA and/or kV according to patient size and/or use of iterative reconstruction technique.  CONTRAST:  OMNIPAQUE IOHEXOL 300 MG/ML  SOLN COMPARISON:  CT OF THE ABDOMEN AND PELVIS WITH CONTRAST 05/23/2018 FINDINGS: Lower chest: The lung bases are clear without focal nodule, mass, or airspace disease. The heart size is normal. No significant pleural or pericardial effusion is present. Hepatobiliary: Diffuse fatty infiltration of the liver is present. No discrete lesions are present. The common bile duct and gallbladder are normal. Pancreas: Unremarkable. No pancreatic ductal dilatation or surrounding inflammatory changes. Spleen: Normal in size without focal abnormality. Adrenals/Urinary Tract: Adrenal glands are unremarkable. Kidneys are normal, without renal calculi, focal lesion, or hydronephrosis. Bladder is unremarkable. Stomach/Bowel: Stomach is within normal limits. Appendix appears normal. No evidence of bowel wall thickening, distention, or inflammatory changes. The distal colonic anastomosis is intact. Presacral soft tissue stranding or scarring and calcification is stable. Vascular/Lymphatic: Atherosclerotic calcifications are present at the aorta branch vessels. No aneurysm is present. No significant adenopathy is present. Reproductive: Choose 3 seminal vesicles are within normal limits. A prominent left-sided hydrocele is present. Other: Fat herniates into the left inguinal  canal without associated bowel. No other significant ventral hernias are present. No free fluid or free air is present. Musculoskeletal: The vertebral body heights and alignment are normal. Straightening of the normal lumbar lordosis is present. No focal osseous lesions are present. Pelvis is within normal limits. The hips are located. Mild degenerative changes are present both hips. IMPRESSION: 1. No acute or focal lesion to explain the patient's symptoms. 2. Hepatic steatosis. 3. Prominent left-sided hydrocele. 4. Fat herniates into the left inguinal canal without associated bowel. 5. Mild degenerative changes of both hips. 6.  Aortic Atherosclerosis (ICD10-I70.0). Electronically Signed   By: Marin Roberts M.D.   On: 03/23/2023 11:27    Assessment/Plan KERVENS GREGORIO is a 57 y.o. male with Small history significant for congestive heart failure, non-insulin-dependent diabetes, depression, hypertension being admitted to the hospital service with new onset atrial fibrillation with RVR.  Atrial fibrillation with RVR-patient without known prior history of this, though he does state that he had some kind of arrhythmia when he was hospitalized in [place] many years ago.  Unclear inciting factor, perhaps related to his recent nausea vomiting but I suspect this has actually been ongoing intermittently for some time.  No evidence of heart failure or other complicating symptoms. -Inpatient admission to telemetry -Continue IV Cardizem drip -Check TSH, magnesium level -Maintain potassium greater than 4, magnesium greater than 2 -2D echo -Start anticoagulation with Eliquis 5 mg p.o. twice daily -If remains stable, will plan to transition to oral rate control medication with outpatient cardiology consultation  GERD-PPI  Type 2 diabetes-non-insulin-dependent, check hemoglobin A1c, carb controlled diet, SSI  DVT prophylaxis: Eliquis     Code Status: Full Code  Consults called: None  Admission  status: The appropriate patient status for this patient is INPATIENT. Inpatient status is judged to be reasonable and necessary in order to provide the required intensity of service to ensure the patient's safety. The patient's presenting symptoms, physical exam findings, and initial radiographic and laboratory data in the context of their chronic comorbidities is felt to place them at high risk for further clinical deterioration. Furthermore, it is not anticipated that the patient will be medically stable for discharge from the hospital within 2 midnights of admission.    I certify that at the point of admission it is my clinical judgment that the patient will require inpatient hospital care spanning beyond 2 midnights from the point of admission due to  high intensity of service, high risk for further deterioration and high frequency of surveillance required  Time spent: 59 minutes  Caysen Whang Sharlette Dense MD [facility] Pager [phone]  If 7PM-7AM, please contact night-coverage [email or web address] Password [email or web address]  03/23/2023, 1:47 PM

## 2023-03-23 NOTE — ED Notes (Signed)
ED TO INPATIENT HANDOFF REPORT  ED Nurse Name and Phone #: Jerline Pain Name/Age/Gender Lucas Small 57 y.o. male Room/Bed: WA23/WA23  Code Status   Code Status: Full Code  Home/SNF/Other Home Patient oriented to: self, place, and time Is this baseline? Yes   Triage Complete: Triage complete  Chief Complaint Atrial fibrillation with RVR (HCC) [I48.91]  Triage Note Patient reports nausea and vomiting x 2 weeks, but has worsened over the last 3 days. Patient has extensive abdominal surgery hx. Also has hx of "needing his throat stretched." Denies fevers, constipation, and diarrhea.    Allergies Allergies  Allergen Reactions   Bee Venom Anaphylaxis    Level of Care/Admitting Diagnosis ED Disposition     ED Disposition  Admit   Condition  --   Comment  Hospital Area: [facility] [[id number]]  Level of Care: Telemetry [5]  Admit to tele based on following criteria: Complex arrhythmia (Bradycardia/Tachycardia)  May admit patient to [facility] or [facility] if equivalent level of care is available:: Yes  Covid Evaluation: Asymptomatic - no recent exposure (last 10 days) testing not required  Diagnosis: Atrial fibrillation with RVR [facility]) [[id number]]  Admitting Physician: Maryln Gottron [[id number]]  Attending Physician: Kirby Crigler, MIR [email or web address] [[id number]]  Certification:: I certify this patient will need inpatient services for at least 2 midnights  Expected Medical Readiness: 03/25/2023          B Medical/Surgery History Past Medical History:  Diagnosis Date   Alcoholic (HCC)    CHF (congestive heart failure) (HCC)    Depression    Diabetes mellitus without complication (HCC)    metformin   Drug-seeking behavior    Dyspnea    with exertion .   Dysrhythmia    while hospitalized in [place]   Gastric rupture    History of kidney stones    Hypertension    MRSA (methicillin resistant Staphylococcus aureus)    Multiple myeloma (HCC) 1990's    Perforation bowel (HCC)    PNA (pneumonia)    Renal disorder    Renal failure    Renal insufficiency    Respiratory failure (HCC)    Sickle cell anemia (HCC)    Sleep apnea    no CPAP machine   Past Surgical History:  Procedure Laterality Date   ABDOMINAL SURGERY     APPENDECTOMY     CARDIAC SURGERY     HERNIA REPAIR     ILEOSTOMY     PERIPHERALLY INSERTED CENTRAL CATHETER INSERTION       A IV Location/Drains/Wounds Patient Lines/Drains/Airways Status     Active Line/Drains/Airways     Name Placement date Placement time Site Days   Peripheral IV 03/23/23 20 G Left Antecubital 03/23/23  0856  Antecubital  less than 1   Wound 11/04/12 Other (Comment) Abdomen Mid 11/04/12  1935  Abdomen  3791            Intake/Output Last 24 hours No intake or output data in the 24 hours ending 03/23/23 1349  Labs/Imaging Results for orders placed or performed during the hospital encounter of 03/23/23 (from the past 48 hour(s))  Lipase, blood     Status: None   Collection Time: 03/23/23  9:08 AM  Result Value Ref Range   Lipase 26 11 - 51 U/L    Comment: Performed at [facility], [address] [place], [place] [address]  Comprehensive metabolic panel     Status: Abnormal  Collection Time: 03/23/23  9:08 AM  Result Value Ref Range   Sodium 132 (L) 135 - 145 mmol/L   Potassium 3.6 3.5 - 5.1 mmol/L   Chloride 95 (L) 98 - 111 mmol/L   CO2 25 22 - 32 mmol/L   Glucose, Bld 169 (H) 70 - 99 mg/dL    Comment: Glucose reference range applies only to samples taken after fasting for at least 8 hours.   BUN 29 (H) 6 - 20 mg/dL   Creatinine, Ser 6.96 0.61 - 1.24 mg/dL   Calcium 9.4 8.9 - 29.5 mg/dL   Total Protein 7.6 6.5 - 8.1 g/dL   Albumin 4.2 3.5 - 5.0 g/dL   AST 87 (H) 15 - 41 U/L   ALT 80 (H) 0 - 44 U/L   Alkaline Phosphatase 77 38 - 126 U/L   Total Bilirubin 1.6 (H) 0.3 - 1.2 mg/dL   GFR, Estimated >28 >41 mL/min    Comment: (NOTE) Calculated using the  CKD-EPI Creatinine Equation (2021)    Anion gap 12 5 - 15    Comment: Performed at [facility], [address], [place], [place] [address]  CBC with Differential     Status: Abnormal   Collection Time: 03/23/23  9:08 AM  Result Value Ref Range   WBC 9.1 4.0 - 10.5 K/uL   RBC 4.47 4.22 - 5.81 MIL/uL   Hemoglobin 15.7 13.0 - 17.0 g/dL   HCT 10.2 72.5 - 36.6 %   MCV 99.6 80.0 - 100.0 fL   MCH 35.1 (H) 26.0 - 34.0 pg   MCHC 35.3 30.0 - 36.0 g/dL   RDW 44.0 34.7 - 42.5 %   Platelets 205 150 - 400 K/uL   nRBC 0.0 0.0 - 0.2 %   Neutrophils Relative % 64 %   Neutro Abs 5.8 1.7 - 7.7 K/uL   Lymphocytes Relative 27 %   Lymphs Abs 2.5 0.7 - 4.0 K/uL   Monocytes Relative 6 %   Monocytes Absolute 0.5 0.1 - 1.0 K/uL   Eosinophils Relative 2 %   Eosinophils Absolute 0.2 0.0 - 0.5 K/uL   Basophils Relative 1 %   Basophils Absolute 0.1 0.0 - 0.1 K/uL   Immature Granulocytes 0 %   Abs Immature Granulocytes 0.03 0.00 - 0.07 K/uL    Comment: Performed at [facility], [address] [place], [place] [address]  Urinalysis, w/ Reflex to Culture (Infection Suspected) -Urine, Clean Catch     Status: Abnormal   Collection Time: 03/23/23 11:45 AM  Result Value Ref Range   Specimen Source URINE, CLEAN CATCH    Color, Urine YELLOW YELLOW   APPearance CLEAR CLEAR   Specific Gravity, Urine 1.044 (H) 1.005 - 1.030   pH 5.0 5.0 - 8.0   Glucose, UA NEGATIVE NEGATIVE mg/dL   Hgb urine dipstick NEGATIVE NEGATIVE   Bilirubin Urine NEGATIVE NEGATIVE   Ketones, ur NEGATIVE NEGATIVE mg/dL   Protein, ur NEGATIVE NEGATIVE mg/dL   Nitrite NEGATIVE NEGATIVE   Leukocytes,Ua NEGATIVE NEGATIVE   RBC / HPF 0-5 0 - 5 RBC/hpf   WBC, UA 0-5 0 - 5 WBC/hpf    Comment:        Reflex urine culture not performed if WBC <=10, OR if Squamous epithelial cells >5. If Squamous epithelial cells >5 suggest recollection.    Bacteria, UA NONE SEEN NONE SEEN   Squamous Epithelial / HPF  0-5 0 - 5 /HPF   Mucus PRESENT  Comment: Performed at [facility], [address] [place], [place] [address]   CT ABDOMEN PELVIS W CONTRAST  Result Date: 03/23/2023 CLINICAL DATA:  LEFT LEFT-SIDED ABDOMINAL PAIN, NAUSEA AND VOMITING. EXAM: CT ABDOMEN AND PELVIS WITH CONTRAST TECHNIQUE: Multidetector CT imaging of the abdomen and pelvis was performed using the standard protocol following bolus administration of intravenous contrast. RADIATION DOSE REDUCTION: This exam was performed according to the departmental dose-optimization program which includes automated exposure control, adjustment of the mA and/or kV according to patient size and/or use of iterative reconstruction technique. CONTRAST:  OMNIPAQUE IOHEXOL 300 MG/ML  SOLN COMPARISON:  CT OF THE ABDOMEN AND PELVIS WITH CONTRAST 05/23/2018 FINDINGS: Lower chest: The lung bases are clear without focal nodule, mass, or airspace disease. The heart size is normal. No significant pleural or pericardial effusion is present. Hepatobiliary: Diffuse fatty infiltration of the liver is present. No discrete lesions are present. The common bile duct and gallbladder are normal. Pancreas: Unremarkable. No pancreatic ductal dilatation or surrounding inflammatory changes. Spleen: Normal in size without focal abnormality. Adrenals/Urinary Tract: Adrenal glands are unremarkable. Kidneys are normal, without renal calculi, focal lesion, or hydronephrosis. Bladder is unremarkable. Stomach/Bowel: Stomach is within normal limits. Appendix appears normal. No evidence of bowel wall thickening, distention, or inflammatory changes. The distal colonic anastomosis is intact. Presacral soft tissue stranding or scarring and calcification is stable. Vascular/Lymphatic: Atherosclerotic calcifications are present at the aorta branch vessels. No aneurysm is present. No significant adenopathy is present. Reproductive: Choose 3 seminal vesicles are within normal  limits. A prominent left-sided hydrocele is present. Other: Fat herniates into the left inguinal canal without associated bowel. No other significant ventral hernias are present. No free fluid or free air is present. Musculoskeletal: The vertebral body heights and alignment are normal. Straightening of the normal lumbar lordosis is present. No focal osseous lesions are present. Pelvis is within normal limits. The hips are located. Mild degenerative changes are present both hips. IMPRESSION: 1. No acute or focal lesion to explain the patient's symptoms. 2. Hepatic steatosis. 3. Prominent left-sided hydrocele. 4. Fat herniates into the left inguinal canal without associated bowel. 5. Mild degenerative changes of both hips. 6.  Aortic Atherosclerosis (ICD10-I70.0). Electronically Signed   By: Marin Roberts M.D.   On: 03/23/2023 11:27    Pending Labs Unresulted Labs (From admission, onward)     Start     Ordered   03/24/23 0500  Basic metabolic panel  Tomorrow morning,   R        03/23/23 1346   03/24/23 0500  CBC  Tomorrow morning,   R        03/23/23 1346   03/23/23 1346  HIV Antibody (routine testing w rflx)  (HIV Antibody (Routine testing w reflex) panel)  Once,   R        03/23/23 1346   03/23/23 1345  Magnesium  Add-on,   AD        03/23/23 1345   03/23/23 1345  TSH  Once,   R        03/23/23 1345            Vitals/Pain Today's Vitals   03/23/23 1230 03/23/23 1231 03/23/23 1245 03/23/23 1300  BP: (!) 158/144  (!) 140/99 (!) 145/104  Pulse: 88  91 (!) 101  Resp: (!) 29  13 17   Temp:  98.2 F (36.8 C)    TempSrc:  Oral    SpO2: 94%  93% 94%  Weight:      Height:      PainSc:        Isolation Precautions No active isolations  Medications Medications  diltiazem (CARDIZEM) 125 mg in dextrose 5% 125 mL (1 mg/mL) infusion (5 mg/hr Intravenous New Bag/Given 03/23/23 1250)  potassium chloride SA (KLOR-CON M) CR tablet 40 mEq (has no administration in time range)   mirtazapine (REMERON) tablet 15 mg (has no administration in time range)  risperiDONE (RISPERDAL) tablet 1 mg (has no administration in time range)  pantoprazole (PROTONIX) EC tablet 40 mg (has no administration in time range)  acetaminophen (TYLENOL) tablet 650 mg (has no administration in time range)    Or  acetaminophen (TYLENOL) suppository 650 mg (has no administration in time range)  ondansetron (ZOFRAN) tablet 4 mg (has no administration in time range)    Or  ondansetron (ZOFRAN) injection 4 mg (has no administration in time range)  albuterol (PROVENTIL) (2.5 MG/3ML) 0.083% nebulizer solution 2.5 mg (has no administration in time range)  apixaban (ELIQUIS) tablet 5 mg (has no administration in time range)  sodium chloride 0.9 % bolus 500 mL (0 mLs Intravenous Stopped 03/23/23 0938)  ondansetron (ZOFRAN) injection 4 mg (4 mg Intravenous Given 03/23/23 0904)  sodium chloride 0.9 % bolus 1,000 mL (0 mLs Intravenous Stopped 03/23/23 1128)  iohexol (OMNIPAQUE) 300 MG/ML solution 100 mL (100 mLs Intravenous Contrast Given 03/23/23 1017)  diltiazem (CARDIZEM) injection 10 mg (10 mg Intravenous Given 03/23/23 1232)    Mobility walks     Focused Assessments Cardiac Assessment Handoff:    Lab Results  Component Value Date   TROPONINI <0.03 08/27/2014   Lab Results  Component Value Date   DDIMER 1.46 (H) 11/05/2012   Does the Patient currently have chest pain? No    R Recommendations: See Admitting Provider Note  Report given to:   Additional Notes:  Pt has cardizem drip running at 5mg /hr, pt endorses getting off alcohol recently, last drink being 3 days ago

## 2023-03-23 NOTE — ED Notes (Signed)
Pt placed on monitor by tech, IV inserted by this RN, meds given as ordered, pt provided urinal and made aware of need for urine sample

## 2023-03-24 ENCOUNTER — Inpatient Hospital Stay (HOSPITAL_COMMUNITY): Payer: PPO

## 2023-03-24 DIAGNOSIS — I4891 Unspecified atrial fibrillation: Secondary | ICD-10-CM | POA: Diagnosis not present

## 2023-03-24 LAB — ECHOCARDIOGRAM COMPLETE
AR max vel: 3.23 cm2
AV Peak grad: 4.3 mm[Hg]
Ao pk vel: 1.04 m/s
Area-P 1/2: 3.63 cm2
Height: 68 in
S' Lateral: 3.4 cm
Weight: 2927.71 [oz_av]

## 2023-03-24 LAB — CBC
HCT: 40.7 % (ref 39.0–52.0)
Hemoglobin: 13.8 g/dL (ref 13.0–17.0)
MCH: 34.9 pg — ABNORMAL HIGH (ref 26.0–34.0)
MCHC: 33.9 g/dL (ref 30.0–36.0)
MCV: 103 fL — ABNORMAL HIGH (ref 80.0–100.0)
Platelets: 179 10*3/uL (ref 150–400)
RBC: 3.95 MIL/uL — ABNORMAL LOW (ref 4.22–5.81)
RDW: 13.1 % (ref 11.5–15.5)
WBC: 6.2 10*3/uL (ref 4.0–10.5)
nRBC: 0 % (ref 0.0–0.2)

## 2023-03-24 LAB — GLUCOSE, CAPILLARY
Glucose-Capillary: 113 mg/dL — ABNORMAL HIGH (ref 70–99)
Glucose-Capillary: 136 mg/dL — ABNORMAL HIGH (ref 70–99)
Glucose-Capillary: 167 mg/dL — ABNORMAL HIGH (ref 70–99)
Glucose-Capillary: 132 mg/dL — ABNORMAL HIGH (ref 70–99)

## 2023-03-24 LAB — BASIC METABOLIC PANEL
Anion gap: 11 (ref 5–15)
BUN: 19 mg/dL (ref 6–20)
CO2: 25 mmol/L (ref 22–32)
Calcium: 8.8 mg/dL — ABNORMAL LOW (ref 8.9–10.3)
Chloride: 99 mmol/L (ref 98–111)
Creatinine, Ser: 0.95 mg/dL (ref 0.61–1.24)
GFR, Estimated: >60 mL/min (ref >60)
Glucose, Bld: 107 mg/dL — ABNORMAL HIGH (ref 70–99)
Potassium: 3.8 mmol/L (ref 3.5–5.1)
Sodium: 135 mmol/L (ref 135–145)

## 2023-03-24 MED ORDER — PNEUMOCOCCAL 20-VAL CONJ VACC 0.5 ML IM SUSY
0.5000 mL | PREFILLED_SYRINGE | INTRAMUSCULAR | Status: AC
  Administered 2023-03-25: 0.5 mL via INTRAMUSCULAR
  Filled 2023-03-24: qty 0.5

## 2023-03-24 MED ORDER — INFLUENZA VIRUS VACC SPLIT PF (FLUZONE) 0.5 ML IM SUSY
0.5000 mL | PREFILLED_SYRINGE | INTRAMUSCULAR | Status: AC
  Administered 2023-03-25: 0.5 mL via INTRAMUSCULAR
  Filled 2023-03-24: qty 0.5

## 2023-03-24 MED ORDER — METOPROLOL SUCCINATE ER 50 MG PO TB24
50.0000 mg | ORAL_TABLET | Freq: Every day | ORAL | Status: DC
  Administered 2023-03-24: 50 mg via ORAL
  Filled 2023-03-24: qty 1

## 2023-03-24 NOTE — Plan of Care (Signed)
  Problem: Education: Goal: Ability to describe self-care measures that may prevent or decrease complications (Diabetes Survival Skills Education) will improve Outcome: Progressing Goal: Individualized Educational Video(s) Outcome: Progressing   Problem: Fluid Volume: Goal: Ability to maintain a balanced intake and output will improve Outcome: Progressing   Problem: Nutritional: Goal: Maintenance of adequate nutrition will improve Outcome: Progressing Goal: Progress toward achieving an optimal weight will improve Outcome: Progressing

## 2023-03-24 NOTE — Progress Notes (Signed)
Echocardiogram 2D Echocardiogram has been performed.  Lucas Small 03/24/2023, 3:02 PM

## 2023-03-24 NOTE — Progress Notes (Signed)
PROGRESS NOTE    Lucas Small  HYQ:[id number] DOB: 11/27/1965 DOA: 03/23/2023 PCP: [facility]   Brief Narrative:  HPI: Lucas Small is a 57 y.o. male with medical history significant for congestive heart failure, non-insulin-dependent diabetes, depression, hypertension being admitted to the hospital service with new onset atrial fibrillation with RVR.  Patient presented to the emergency department with several days of nausea and vomiting.  He denied any palpitations, chest pain, dizziness, fevers, chills, diarrhea.  Evaluation in the emergency department as below showed no acute abdominal process, he felt better and was able to tolerate an oral diet.  However, while in the emergency department he was noted to become more tachycardic, A-fib with RVR was noted on the monitor.  He has no prior history of this, though he says that when he was hospitalized for a couple of months in [place] many years ago, he did have some problems with heart failure and some other problems that he is not aware of.  He also states that when he was discharged from the hospital, he was on a blood thinner for just a couple of weeks, had a clot somewhere not sure where.  He denies any dizziness, chest pain, although intermittently he did feel like his heart was racing but if he rested it would just get better.  He was placed on IV Cardizem drip, and it looks like he was just converted back to sinus rhythm.   Assessment & Plan:   Principal Problem:   Atrial fibrillation with RVR (HCC)  New onset atrial fibrillation with RVR: Patient started on Cardizem drip, he has been in and out of atrial fibrillation but recently in sinus rhythm.  On low-dose Cardizem.  Rate 105.  Will start on Toprol-XL 50 mg and stop Cardizem and monitor the day.  Echo pending.  Patient is asymptomatic.  Continue Eliquis.  If evidence of flipping back into A-fib, may consider consulting cardiology.  Otherwise follow-up as  outpatient.  GERD: Continue PPI.  Type 2 diabetes mellitus: Hemoglobin A1c 6.  Continue SSI.  Essential hypertension: No previous history of hypertension but blood pressure elevated in the range of class I hypertension.  Hopefully this will get controlled with Toprol-XL.  DVT prophylaxis: SCDs Start: 03/23/23 1346 Eliquis   Code Status: Full Code  Family Communication:  None present at bedside.  Plan of care discussed with patient in length and he/she verbalized understanding and agreed with it.  Status is: Inpatient Remains inpatient appropriate because: Needs monitoring and echo.   Estimated body mass index is 27.82 kg/m as calculated from the following:   Height as of this encounter: 5\' 8"  (1.727 m).   Weight as of this encounter: 83 kg.    Nutritional Assessment: Body mass index is 27.82 kg/m.Marland Kitchen Seen by dietician.  I agree with the assessment and plan as outlined below: Nutrition Status:        . Skin Assessment: I have examined the patient's skin and I agree with the wound assessment as performed by the wound care RN as outlined below:    Consultants:  None  Procedures:  None none  Antimicrobials:  Anti-infectives (From admission, onward)    None         Subjective: Seen and examined.  He has no complaints.  Denies palpitation, chest pain or shortness of breath.  Objective: Vitals:   03/23/23 2002 03/23/23 2355 03/24/23 0430 03/24/23 0816  BP: 133/81 (!) 132/94 (!) 122/96 (!) 141/90  Pulse: 97 (!)  102 94 (!) 103  Resp: 20 18 18    Temp: 98.6 F (37 C) 98.1 F (36.7 C) 98.1 F (36.7 C) 98.2 F (36.8 C)  TempSrc: Oral Oral Oral Axillary  SpO2: 95% 93% 95% 98%  Weight:      Height:        Intake/Output Summary (Last 24 hours) at 03/24/2023 1053 Last data filed at 03/24/2023 0816 Gross per 24 hour  Intake 572.6 ml  Output 1200 ml  Net -627.4 ml   Filed Weights   03/23/23 0824 03/23/23 0840 03/23/23 1548  Weight: 83 kg 83 kg 83 kg     Examination:  General exam: Appears calm and comfortable  Respiratory system: Clear to auscultation. Respiratory effort normal. Cardiovascular system: S1 & S2 heard, RRR. No JVD, murmurs, rubs, gallops or clicks. No pedal edema. Gastrointestinal system: Abdomen is nondistended, soft and nontender. No organomegaly or masses felt. Normal bowel sounds heard. Central nervous system: Alert and oriented. No focal neurological deficits. Extremities: Symmetric 5 x 5 power. Skin: No rashes, lesions or ulcers Psychiatry: Judgement and insight appear normal. Mood & affect appropriate.    Data Reviewed: I have personally reviewed following labs and imaging studies  CBC: Recent Labs  Lab 03/23/23 0908 03/24/23 0525  WBC 9.1 6.2  NEUTROABS 5.8  --   HGB 15.7 13.8  HCT 44.5 40.7  MCV 99.6 103.0*  PLT 205 179   Basic Metabolic Panel: Recent Labs  Lab 03/23/23 0908 03/23/23 1605 03/24/23 0525  NA 132*  --  135  K 3.6  --  3.8  CL 95*  --  99  CO2 25  --  25  GLUCOSE 169*  --  107*  BUN 29*  --  19  CREATININE 1.24  --  0.95  CALCIUM 9.4  --  8.8*  MG  --  1.8  --    GFR: Estimated Creatinine Clearance: 91.1 mL/min (by C-G formula based on SCr of 0.95 mg/dL). Liver Function Tests: Recent Labs  Lab 03/23/23 0908  AST 87*  ALT 80*  ALKPHOS 77  BILITOT 1.6*  PROT 7.6  ALBUMIN 4.2   Recent Labs  Lab 03/23/23 0908  LIPASE 26   No results for input(s): "AMMONIA" in the last 168 hours. Coagulation Profile: No results for input(s): "INR", "PROTIME" in the last 168 hours. Cardiac Enzymes: No results for input(s): "CKTOTAL", "CKMB", "CKMBINDEX", "TROPONINI" in the last 168 hours. BNP (last 3 results) No results for input(s): "PROBNP" in the last 8760 hours. HbA1C: Recent Labs    03/23/23 1605  HGBA1C 6.0*   CBG: Recent Labs  Lab 03/23/23 1615 03/23/23 2117 03/24/23 0724  GLUCAP 112* 106* 113*   Lipid Profile: No results for input(s): "CHOL", "HDL",  "LDLCALC", "TRIG", "CHOLHDL", "LDLDIRECT" in the last 72 hours. Thyroid Function Tests: Recent Labs    03/23/23 1605  TSH 1.034   Anemia Panel: No results for input(s): "VITAMINB12", "FOLATE", "FERRITIN", "TIBC", "IRON", "RETICCTPCT" in the last 72 hours. Sepsis Labs: No results for input(s): "PROCALCITON", "LATICACIDVEN" in the last 168 hours.  No results found for this or any previous visit (from the past 240 hour(s)).   Radiology Studies: CT ABDOMEN PELVIS W CONTRAST  Result Date: 03/23/2023 CLINICAL DATA:  LEFT LEFT-SIDED ABDOMINAL PAIN, NAUSEA AND VOMITING. EXAM: CT ABDOMEN AND PELVIS WITH CONTRAST TECHNIQUE: Multidetector CT imaging of the abdomen and pelvis was performed using the standard protocol following bolus administration of intravenous contrast. RADIATION DOSE REDUCTION: This exam was performed according to  the departmental dose-optimization program which includes automated exposure control, adjustment of the mA and/or kV according to patient size and/or use of iterative reconstruction technique. CONTRAST:  OMNIPAQUE IOHEXOL 300 MG/ML  SOLN COMPARISON:  CT OF THE ABDOMEN AND PELVIS WITH CONTRAST 05/23/2018 FINDINGS: Lower chest: The lung bases are clear without focal nodule, mass, or airspace disease. The heart size is normal. No significant pleural or pericardial effusion is present. Hepatobiliary: Diffuse fatty infiltration of the liver is present. No discrete lesions are present. The common bile duct and gallbladder are normal. Pancreas: Unremarkable. No pancreatic ductal dilatation or surrounding inflammatory changes. Spleen: Normal in size without focal abnormality. Adrenals/Urinary Tract: Adrenal glands are unremarkable. Kidneys are normal, without renal calculi, focal lesion, or hydronephrosis. Bladder is unremarkable. Stomach/Bowel: Stomach is within normal limits. Appendix appears normal. No evidence of bowel wall thickening, distention, or inflammatory changes. The  distal colonic anastomosis is intact. Presacral soft tissue stranding or scarring and calcification is stable. Vascular/Lymphatic: Atherosclerotic calcifications are present at the aorta branch vessels. No aneurysm is present. No significant adenopathy is present. Reproductive: Choose 3 seminal vesicles are within normal limits. A prominent left-sided hydrocele is present. Other: Fat herniates into the left inguinal canal without associated bowel. No other significant ventral hernias are present. No free fluid or free air is present. Musculoskeletal: The vertebral body heights and alignment are normal. Straightening of the normal lumbar lordosis is present. No focal osseous lesions are present. Pelvis is within normal limits. The hips are located. Mild degenerative changes are present both hips. IMPRESSION: 1. No acute or focal lesion to explain the patient's symptoms. 2. Hepatic steatosis. 3. Prominent left-sided hydrocele. 4. Fat herniates into the left inguinal canal without associated bowel. 5. Mild degenerative changes of both hips. 6.  Aortic Atherosclerosis (ICD10-I70.0). Electronically Signed   By: Marin Roberts M.D.   On: 03/23/2023 11:27    Scheduled Meds:  apixaban  5 mg Oral BID   [START ON 03/25/2023] influenza vac split trivalent PF  0.5 mL Intramuscular Tomorrow-1000   insulin aspart  0-15 Units Subcutaneous TID WC   insulin aspart  0-5 Units Subcutaneous QHS   metoprolol succinate  50 mg Oral Daily   mirtazapine  15 mg Oral QHS   pantoprazole  40 mg Oral Daily   [START ON 03/25/2023] pneumococcal 20-valent conjugate vaccine  0.5 mL Intramuscular Tomorrow-1000   risperiDONE  1 mg Oral QHS   Continuous Infusions:  diltiazem (CARDIZEM) infusion 5 mg/hr (03/24/23 0819)     LOS: 1 day   Hughie Closs, MD [facility]  03/24/2023, 10:53 AM   *Please note that this is a verbal dictation therefore any spelling or grammatical errors are due to the "[employer] One"  system interpretation.  Please page via Amion and do not message via secure chat for urgent patient care matters. Secure chat can be used for non urgent patient care matters.  How to contact the [facility] Attending or Consulting provider 7A - 7P or covering provider during after hours 7P -7A, for this patient?  Check the care team in [facility] and look for a) attending/consulting [facility] provider listed and b) the [facility] team listed. Page or secure chat 7A-7P. Log into [email or web address] and use [facility] universal password to access. If you do not have the password, please contact the hospital operator. Locate the [facility] provider you are looking for under [facility] and page to a number that you can be directly reached. If you still have difficulty reaching  the provider, please page the [facility] (Director on Call) for the Hospitalists listed on amion for assistance.

## 2023-03-24 NOTE — Progress Notes (Signed)
Mobility Specialist - Progress Note  Pre-mobility: 97 bpm HR,  During mobility: 111 bpm HR, Post-mobility: 102 bpm HR,    03/24/23 1136  Mobility  Activity Ambulated independently in hallway  Level of Assistance Standby assist, set-up cues, supervision of patient - no hands on  Assistive Device None  Distance Ambulated (ft) 1000 ft  Range of Motion/Exercises Active  Activity Response Tolerated well  Mobility Referral Yes  $Mobility charge 1 Mobility  Mobility Specialist Start Time (ACUTE ONLY) 1120  Mobility Specialist Stop Time (ACUTE ONLY) 1136  Mobility Specialist Time Calculation (min) (ACUTE ONLY) 16 min   Pt was found in bed and agreeable to ambulate. No complaints with session. At EOS returned to bed with all needs met. Call bell in reach.  Billey Chang Mobility Specialist

## 2023-03-25 ENCOUNTER — Encounter (HOSPITAL_COMMUNITY): Payer: Self-pay | Admitting: Internal Medicine

## 2023-03-25 DIAGNOSIS — R7401 Elevation of levels of liver transaminase levels: Secondary | ICD-10-CM | POA: Diagnosis not present

## 2023-03-25 DIAGNOSIS — I4892 Unspecified atrial flutter: Secondary | ICD-10-CM

## 2023-03-25 DIAGNOSIS — F1010 Alcohol abuse, uncomplicated: Secondary | ICD-10-CM

## 2023-03-25 DIAGNOSIS — I4891 Unspecified atrial fibrillation: Secondary | ICD-10-CM | POA: Diagnosis not present

## 2023-03-25 LAB — GLUCOSE, CAPILLARY
Glucose-Capillary: 108 mg/dL — ABNORMAL HIGH (ref 70–99)
Glucose-Capillary: 110 mg/dL — ABNORMAL HIGH (ref 70–99)
Glucose-Capillary: 137 mg/dL — ABNORMAL HIGH (ref 70–99)
Glucose-Capillary: 158 mg/dL — ABNORMAL HIGH (ref 70–99)

## 2023-03-25 LAB — BASIC METABOLIC PANEL
Anion gap: 8 (ref 5–15)
BUN: 17 mg/dL (ref 6–20)
CO2: 28 mmol/L (ref 22–32)
Calcium: 9.1 mg/dL (ref 8.9–10.3)
Chloride: 100 mmol/L (ref 98–111)
Creatinine, Ser: 0.94 mg/dL (ref 0.61–1.24)
GFR, Estimated: >60 mL/min (ref >60)
Glucose, Bld: 119 mg/dL — ABNORMAL HIGH (ref 70–99)
Potassium: 4.3 mmol/L (ref 3.5–5.1)
Sodium: 136 mmol/L (ref 135–145)

## 2023-03-25 MED ORDER — METOPROLOL SUCCINATE ER 100 MG PO TB24
100.0000 mg | ORAL_TABLET | Freq: Every day | ORAL | Status: DC
  Administered 2023-03-25 – 2023-03-26 (×2): 100 mg via ORAL
  Filled 2023-03-25 (×2): qty 1

## 2023-03-25 MED ORDER — BISACODYL 10 MG RE SUPP
10.0000 mg | Freq: Once | RECTAL | Status: AC
  Administered 2023-03-25: 10 mg via RECTAL
  Filled 2023-03-25: qty 1

## 2023-03-25 NOTE — Plan of Care (Signed)
  Problem: Education: Goal: Knowledge of General Education information will improve Description: Including pain rating scale, medication(s)/side effects and non-pharmacologic comfort measures Outcome: Progressing   Problem: Clinical Measurements: Goal: Diagnostic test results will improve Outcome: Progressing Goal: Respiratory complications will improve Outcome: Progressing   Problem: Nutrition: Goal: Adequate nutrition will be maintained Outcome: Progressing   Problem: Coping: Goal: Level of anxiety will decrease Outcome: Progressing   Problem: Elimination: Goal: Will not experience complications related to urinary retention Outcome: Progressing   Problem: Pain Management: Goal: General experience of comfort will improve Outcome: Progressing

## 2023-03-25 NOTE — Progress Notes (Addendum)
ECHOCARDIOGRAM REPORT   Patient Name:   Lucas Small [facility] Date of Exam: 03/24/2023 Medical Rec #:  [id number]       Height:       68.0 in Accession #:    [id number]      Weight:       183.0 lb Date of Birth:  1965/06/19       BSA:          1.968 m Patient Age:    57 years        BP:           122/96 mmHg Patient Gender: M               HR:           77 bpm. Exam Location:  Inpatient Procedure: 2D Echo, Cardiac Doppler and Color Doppler Indications:    Atrial Fibrillation I48.91  History:        Patient has prior history of Echocardiogram examinations, most                 recent 11/02/2016. CHF, Arrythmias:Atrial Fibrillation,                 Signs/Symptoms:Dyspnea; Risk Factors:Hypertension, Diabetes,                 Sleep Apnea and Current Smoker.  Sonographer:    Lucendia Herrlich RCS Referring Phys: [id number] MIR M [facility] IMPRESSIONS  1. Left ventricular ejection fraction, by estimation, is 50 to 55%. The left ventricle has low normal function. The left ventricle has no regional wall motion abnormalities. Left ventricular diastolic parameters are consistent with Grade I diastolic dysfunction (impaired relaxation).  2. Right  ventricular systolic function is normal. The right ventricular size is normal. There is normal pulmonary artery systolic pressure. The estimated right ventricular systolic pressure is 29.5 mmHg.  3. The mitral valve is grossly normal. Trivial mitral valve regurgitation. No evidence of mitral stenosis.  4. The aortic valve is tricuspid. Aortic valve regurgitation is not visualized. No aortic stenosis is present.  5. The inferior vena cava is dilated in size with >50% respiratory variability, suggesting right atrial pressure of 8 mmHg. FINDINGS  Left Ventricle: Left ventricular ejection fraction, by estimation, is 50 to 55%. The left ventricle has low normal function. The left ventricle has no regional wall motion abnormalities. The left ventricular internal cavity size was normal in size. There is no left ventricular hypertrophy. Left ventricular diastolic parameters are consistent with Grade I diastolic dysfunction (impaired relaxation). Right Ventricle: The right ventricular size is normal. No increase in right ventricular wall thickness. Right ventricular systolic function is normal. There is normal pulmonary artery systolic pressure. The tricuspid regurgitant velocity is 2.32 m/s, and  with an assumed right atrial pressure of 8 mmHg, the estimated right ventricular systolic pressure is 29.5 mmHg. Left Atrium: Left atrial size was normal in size. Right Atrium: Right atrial size was normal in size. Pericardium: There is no evidence of pericardial effusion. Mitral Valve: The mitral valve is grossly normal. Trivial mitral valve regurgitation. No evidence of mitral valve stenosis. Tricuspid Valve: The tricuspid valve is grossly normal. Tricuspid valve regurgitation is trivial. No evidence of tricuspid stenosis. Aortic Valve: The aortic valve is tricuspid. Aortic valve regurgitation is not visualized. No aortic stenosis is present. Aortic valve peak gradient measures 4.3 mmHg. Pulmonic Valve: The pulmonic valve was  grossly normal. Pulmonic valve regurgitation is not visualized. No evidence of pulmonic stenosis. Aorta: The aortic  PROGRESS NOTE    Lucas Small  GUY:[id number] DOB: [birth year] DOA: 03/23/2023 [facility]: [facility]   Brief Narrative:  HPI: Lucas Small is a 57 y.o. male with medical history significant for congestive heart failure, non-insulin-dependent diabetes, depression, hypertension being admitted to the hospital service with new onset atrial fibrillation with RVR.  Patient presented to the emergency department with several days of nausea and vomiting.  He denied any palpitations, chest pain, dizziness, fevers, chills, diarrhea.  Evaluation in the emergency department as below showed no acute abdominal process, he felt better and was able to tolerate an oral diet.  However, while in the emergency department he was noted to become more tachycardic, A-fib with RVR was noted on the monitor.  He has no prior history of this, though he says that when he was hospitalized for a couple of months in [place] many years ago, he did have some problems with heart failure and some other problems that he is not aware of.  He also states that when he was discharged from the hospital, he was on a blood thinner for just a couple of weeks, had a clot somewhere not sure where.  He denies any dizziness, chest pain, although intermittently he did feel like his heart was racing but if he rested it would just get better.  He was placed on IV Cardizem drip, and it looks like he was just converted back to sinus rhythm.   Assessment & Plan:   Principal Problem:   Atrial fibrillation with RVR (HCC)  New onset atrial fibrillation with RVR: Initially required Cardizem drip and subsequently transitioned to oral Toprol-XL 50 mg.  Went into atrial fibrillation overnight again for 20 minutes and heart rate has remained around 110 even at rest.  Toprol-XL increased this morning at 100 mg p.o. daily.  Then when he was walking with the nurse, he went into atrial fibrillation again with a heart rate of 180.  I have requested  cardiology consultation.  Continue Eliquis.  Patient remains asymptomatic.  GERD: Continue PPI.  Type 2 diabetes mellitus: Hemoglobin A1c 6.  Continue SSI.  Essential hypertension: No previous history of hypertension but blood pressure elevated in the range of class I hypertension.  Now much better controlled on Toprol-XL.  Chronic diastolic congestive heart failure: Echo shows grade 1 diastolic dysfunction with midrange ejection fraction 50 to 55%.  DVT prophylaxis: SCDs Start: 03/23/23 1346 Eliquis   Code Status: Full Code  Family Communication:  None present at bedside.  Plan of care discussed with patient in length and he/she verbalized understanding and agreed with it.  Status is: Inpatient Remains inpatient appropriate because: Needs monitoring and echo.   Estimated body mass index is 27.82 kg/m as calculated from the following:   Height as of this encounter: 5\' 8"  (1.727 m).   Weight as of this encounter: 83 kg.    Nutritional Assessment: Body mass index is 27.82 kg/m.Marland Kitchen Seen by dietician.  I agree with the assessment and plan as outlined below: Nutrition Status:        . Skin Assessment: I have examined the patient's skin and I agree with the wound assessment as performed by the wound care RN as outlined below:    Consultants:  None  Procedures:  None none  Antimicrobials:  Anti-infectives (From admission, onward)    None         Subjective: Patient seen and examined earlier.  He has no complaints.  Denies any palpitation, shortness  ECHOCARDIOGRAM REPORT   Patient Name:   Lucas Small [facility] Date of Exam: 03/24/2023 Medical Rec #:  [id number]       Height:       68.0 in Accession #:    [id number]      Weight:       183.0 lb Date of Birth:  1965/06/19       BSA:          1.968 m Patient Age:    57 years        BP:           122/96 mmHg Patient Gender: M               HR:           77 bpm. Exam Location:  Inpatient Procedure: 2D Echo, Cardiac Doppler and Color Doppler Indications:    Atrial Fibrillation I48.91  History:        Patient has prior history of Echocardiogram examinations, most                 recent 11/02/2016. CHF, Arrythmias:Atrial Fibrillation,                 Signs/Symptoms:Dyspnea; Risk Factors:Hypertension, Diabetes,                 Sleep Apnea and Current Smoker.  Sonographer:    Lucendia Herrlich RCS Referring Phys: [id number] MIR M [facility] IMPRESSIONS  1. Left ventricular ejection fraction, by estimation, is 50 to 55%. The left ventricle has low normal function. The left ventricle has no regional wall motion abnormalities. Left ventricular diastolic parameters are consistent with Grade I diastolic dysfunction (impaired relaxation).  2. Right  ventricular systolic function is normal. The right ventricular size is normal. There is normal pulmonary artery systolic pressure. The estimated right ventricular systolic pressure is 29.5 mmHg.  3. The mitral valve is grossly normal. Trivial mitral valve regurgitation. No evidence of mitral stenosis.  4. The aortic valve is tricuspid. Aortic valve regurgitation is not visualized. No aortic stenosis is present.  5. The inferior vena cava is dilated in size with >50% respiratory variability, suggesting right atrial pressure of 8 mmHg. FINDINGS  Left Ventricle: Left ventricular ejection fraction, by estimation, is 50 to 55%. The left ventricle has low normal function. The left ventricle has no regional wall motion abnormalities. The left ventricular internal cavity size was normal in size. There is no left ventricular hypertrophy. Left ventricular diastolic parameters are consistent with Grade I diastolic dysfunction (impaired relaxation). Right Ventricle: The right ventricular size is normal. No increase in right ventricular wall thickness. Right ventricular systolic function is normal. There is normal pulmonary artery systolic pressure. The tricuspid regurgitant velocity is 2.32 m/s, and  with an assumed right atrial pressure of 8 mmHg, the estimated right ventricular systolic pressure is 29.5 mmHg. Left Atrium: Left atrial size was normal in size. Right Atrium: Right atrial size was normal in size. Pericardium: There is no evidence of pericardial effusion. Mitral Valve: The mitral valve is grossly normal. Trivial mitral valve regurgitation. No evidence of mitral valve stenosis. Tricuspid Valve: The tricuspid valve is grossly normal. Tricuspid valve regurgitation is trivial. No evidence of tricuspid stenosis. Aortic Valve: The aortic valve is tricuspid. Aortic valve regurgitation is not visualized. No aortic stenosis is present. Aortic valve peak gradient measures 4.3 mmHg. Pulmonic Valve: The pulmonic valve was  grossly normal. Pulmonic valve regurgitation is not visualized. No evidence of pulmonic stenosis. Aorta: The aortic  ECHOCARDIOGRAM REPORT   Patient Name:   Lucas Small [facility] Date of Exam: 03/24/2023 Medical Rec #:  [id number]       Height:       68.0 in Accession #:    [id number]      Weight:       183.0 lb Date of Birth:  1965/06/19       BSA:          1.968 m Patient Age:    57 years        BP:           122/96 mmHg Patient Gender: M               HR:           77 bpm. Exam Location:  Inpatient Procedure: 2D Echo, Cardiac Doppler and Color Doppler Indications:    Atrial Fibrillation I48.91  History:        Patient has prior history of Echocardiogram examinations, most                 recent 11/02/2016. CHF, Arrythmias:Atrial Fibrillation,                 Signs/Symptoms:Dyspnea; Risk Factors:Hypertension, Diabetes,                 Sleep Apnea and Current Smoker.  Sonographer:    Lucendia Herrlich RCS Referring Phys: [id number] MIR M [facility] IMPRESSIONS  1. Left ventricular ejection fraction, by estimation, is 50 to 55%. The left ventricle has low normal function. The left ventricle has no regional wall motion abnormalities. Left ventricular diastolic parameters are consistent with Grade I diastolic dysfunction (impaired relaxation).  2. Right  ventricular systolic function is normal. The right ventricular size is normal. There is normal pulmonary artery systolic pressure. The estimated right ventricular systolic pressure is 29.5 mmHg.  3. The mitral valve is grossly normal. Trivial mitral valve regurgitation. No evidence of mitral stenosis.  4. The aortic valve is tricuspid. Aortic valve regurgitation is not visualized. No aortic stenosis is present.  5. The inferior vena cava is dilated in size with >50% respiratory variability, suggesting right atrial pressure of 8 mmHg. FINDINGS  Left Ventricle: Left ventricular ejection fraction, by estimation, is 50 to 55%. The left ventricle has low normal function. The left ventricle has no regional wall motion abnormalities. The left ventricular internal cavity size was normal in size. There is no left ventricular hypertrophy. Left ventricular diastolic parameters are consistent with Grade I diastolic dysfunction (impaired relaxation). Right Ventricle: The right ventricular size is normal. No increase in right ventricular wall thickness. Right ventricular systolic function is normal. There is normal pulmonary artery systolic pressure. The tricuspid regurgitant velocity is 2.32 m/s, and  with an assumed right atrial pressure of 8 mmHg, the estimated right ventricular systolic pressure is 29.5 mmHg. Left Atrium: Left atrial size was normal in size. Right Atrium: Right atrial size was normal in size. Pericardium: There is no evidence of pericardial effusion. Mitral Valve: The mitral valve is grossly normal. Trivial mitral valve regurgitation. No evidence of mitral valve stenosis. Tricuspid Valve: The tricuspid valve is grossly normal. Tricuspid valve regurgitation is trivial. No evidence of tricuspid stenosis. Aortic Valve: The aortic valve is tricuspid. Aortic valve regurgitation is not visualized. No aortic stenosis is present. Aortic valve peak gradient measures 4.3 mmHg. Pulmonic Valve: The pulmonic valve was  grossly normal. Pulmonic valve regurgitation is not visualized. No evidence of pulmonic stenosis. Aorta: The aortic  PROGRESS NOTE    Lucas Small  GUY:[id number] DOB: [birth year] DOA: 03/23/2023 [facility]: [facility]   Brief Narrative:  HPI: Lucas Small is a 57 y.o. male with medical history significant for congestive heart failure, non-insulin-dependent diabetes, depression, hypertension being admitted to the hospital service with new onset atrial fibrillation with RVR.  Patient presented to the emergency department with several days of nausea and vomiting.  He denied any palpitations, chest pain, dizziness, fevers, chills, diarrhea.  Evaluation in the emergency department as below showed no acute abdominal process, he felt better and was able to tolerate an oral diet.  However, while in the emergency department he was noted to become more tachycardic, A-fib with RVR was noted on the monitor.  He has no prior history of this, though he says that when he was hospitalized for a couple of months in [place] many years ago, he did have some problems with heart failure and some other problems that he is not aware of.  He also states that when he was discharged from the hospital, he was on a blood thinner for just a couple of weeks, had a clot somewhere not sure where.  He denies any dizziness, chest pain, although intermittently he did feel like his heart was racing but if he rested it would just get better.  He was placed on IV Cardizem drip, and it looks like he was just converted back to sinus rhythm.   Assessment & Plan:   Principal Problem:   Atrial fibrillation with RVR (HCC)  New onset atrial fibrillation with RVR: Initially required Cardizem drip and subsequently transitioned to oral Toprol-XL 50 mg.  Went into atrial fibrillation overnight again for 20 minutes and heart rate has remained around 110 even at rest.  Toprol-XL increased this morning at 100 mg p.o. daily.  Then when he was walking with the nurse, he went into atrial fibrillation again with a heart rate of 180.  I have requested  cardiology consultation.  Continue Eliquis.  Patient remains asymptomatic.  GERD: Continue PPI.  Type 2 diabetes mellitus: Hemoglobin A1c 6.  Continue SSI.  Essential hypertension: No previous history of hypertension but blood pressure elevated in the range of class I hypertension.  Now much better controlled on Toprol-XL.  Chronic diastolic congestive heart failure: Echo shows grade 1 diastolic dysfunction with midrange ejection fraction 50 to 55%.  DVT prophylaxis: SCDs Start: 03/23/23 1346 Eliquis   Code Status: Full Code  Family Communication:  None present at bedside.  Plan of care discussed with patient in length and he/she verbalized understanding and agreed with it.  Status is: Inpatient Remains inpatient appropriate because: Needs monitoring and echo.   Estimated body mass index is 27.82 kg/m as calculated from the following:   Height as of this encounter: 5\' 8"  (1.727 m).   Weight as of this encounter: 83 kg.    Nutritional Assessment: Body mass index is 27.82 kg/m.Marland Kitchen Seen by dietician.  I agree with the assessment and plan as outlined below: Nutrition Status:        . Skin Assessment: I have examined the patient's skin and I agree with the wound assessment as performed by the wound care RN as outlined below:    Consultants:  None  Procedures:  None none  Antimicrobials:  Anti-infectives (From admission, onward)    None         Subjective: Patient seen and examined earlier.  He has no complaints.  Denies any palpitation, shortness

## 2023-03-25 NOTE — Consult Note (Signed)
Cardiology Consultation   Patient ID: Lucas Small MRN: [id number]; DOB: 01/08/1966  Admit date: 03/23/2023 Date of Consult: 03/25/2023  PCP:  [facility]   [facility] Providers Cardiologist: Remotely Dr. Rennis Small (2014), Dr. Royann Small (2018) Click here to update MD or APP on Care Team, Refresh:1}     Patient Profile:   Lucas Small is a 57 y.o. male with a hx of HTN, DM, bipolar disorder, sleep apnea, alcoholism, HLD, tobacco abuse, diastolic dysfunction, coronary calcification by prior coronary CT, prior complex medical admission at OSH with multiple abdominal surgeries who is being seen 03/25/2023 for the evaluation of atrial fibrillation/flutter at the request of Dr. Jacqulyn Small.  History of Present Illness:   Lucas Small remotely saw Dr. Rennis Small in 2014 for dyspnea. He had previously had a complex admission at outside facility with multisystem organ failure with septic shock related to sigmoid diverticulitis. He had had prolonged intubation, tracheostomy, transient heart failure and renal failure. He reported that he had 16 surgeries, including removal of a large part of his abdominal muscles. At that visit the plan was to get records and consider echo versus stress testing. He also had had a history of depression and alcohol abuse, with prior hospitalization for withdrawal and SI. EF was normal by scanned echo 2013. He did not return for follow-up until seen again by Dr. Royann Small in 2018 for evaluation of mild diastolic dysfunction seen on echocardiogram in 2018, overall felt to be reassuring without clinical signs of heart failure, no further testing felt necessary with f/u advised PRN only. He recalled being re-hospitalized in [place] many years ago to have his abdominal wall incision closed, does not recall whether there were any cardiac issues, possibly a blood clot though he's not sure. He denies any known hx of AF/AFL. Of note, multiple myeloma and sickle cell  anemia were entered into patient's chart by nursing staff in 2016. The patient does not recall ever having either of these issues diagnosed therefore were removed from PMH.  He presented to the hospital this admission with nausea and vomiting. He had been drinking approximately 2 drinks a day up until he stopped completely a few days before admission. He then developed issues with appetite, n/v, and abdominal discomfort. He has chronic abdominal pain at baseline. He reports he received an injection in the ER that helped significantly, possibly Zofran + IV fluids. CT A/P showed no acute findings, + hepatic steatosis, L sided hydrocele, fat herniating into inguinal canal without associated bowel, aortic atherosclerosis. Labs showed hyponatremia of 132, transaminitis AST 87/ALT 80, TBili 1.6, TSH wnl. He was initially in sinus tach then went into atrial flutter with RVR. He was treated with diltiazem drip and converted to NSR. He was started on Toprol 50mg  yesterday. However, he subsequently has reverted intermittently back to atrial fib + atrial flutter so this was increased to 100mg  daily this AM. He has minimal awareness of arrhythmia - "something feels different" - but no overt chest pain, dyspnea, decreased endurence, syncope, or edema. This morning he is in AFib with a HR low 100s. He otherwise reports feeling well and generally back to baseline. 2D echo this admission showed EF 50-55%, G1DD, normal RV, dilated IVC.    Past Medical History:  Diagnosis Date   Alcoholic (HCC)    CHF (congestive heart failure) (HCC)    Depression    Diabetes mellitus without complication (HCC)    metformin   Drug-seeking behavior    Dyspnea  reviewed and demonstrates:  outlined above  Relevant CV Studies: 2d echo 03/24/23   1. Left ventricular ejection fraction, by estimation, is 50 to 55%. The  left ventricle has low normal function. The left ventricle has no regional  wall motion abnormalities. Left ventricular diastolic parameters are  consistent with Grade I diastolic  dysfunction (impaired relaxation).   2. Right ventricular systolic function is normal. The right ventricular  size is normal. There is normal pulmonary artery systolic pressure. The  estimated right ventricular systolic pressure is 29.5 mmHg.   3. The mitral valve is grossly normal. Trivial mitral valve  regurgitation. No evidence of mitral stenosis.   4. The aortic valve is tricuspid. Aortic valve regurgitation is not  visualized. No aortic stenosis is present.   5. The inferior vena cava is dilated in size with >50% respiratory  variability, suggesting right atrial pressure of 8 mmHg.    Laboratory Data:  High Sensitivity Troponin:  No results for input(s): "TROPONINIHS" in the last 720 hours.   Chemistry Recent Labs  Lab 03/23/23 0908 03/23/23 1605 03/24/23 0525 03/25/23 0502  NA 132*  --  135 136  K 3.6  --  3.8 4.3  CL 95*  --  99 100  CO2 25  --  25 28  GLUCOSE 169*  --  107* 119*  BUN 29*  --  19 17  CREATININE 1.24  --  0.95 0.94  CALCIUM 9.4  --  8.8* 9.1  MG  --  1.8  --   --   GFRNONAA >60  --  >60 >60  ANIONGAP 12  --  11 8    Recent Labs  Lab 03/23/23 0908  PROT 7.6  ALBUMIN 4.2  AST 87*  ALT 80*  ALKPHOS 77  BILITOT 1.6*   Lipids No results for input(s): "CHOL", "TRIG", "HDL", "LABVLDL", "LDLCALC", "CHOLHDL" in the last 168 hours.  Hematology Recent Labs  Lab 03/23/23 0908 03/24/23 0525  WBC 9.1 6.2  RBC 4.47 3.95*  HGB 15.7 13.8  HCT 44.5 40.7  MCV 99.6 103.0*  MCH 35.1* 34.9*  MCHC 35.3 33.9  RDW 13.2 13.1  PLT 205 179   Thyroid  Recent Labs  Lab 03/23/23 1605  TSH 1.034    BNPNo results for  input(s): "BNP", "PROBNP" in the last 168 hours.  DDimer No results for input(s): "DDIMER" in the last 168 hours.   Radiology/Studies:  ECHOCARDIOGRAM COMPLETE  Result Date: 03/24/2023    ECHOCARDIOGRAM REPORT   Patient Name:   Lucas [facility] Date of Exam: 03/24/2023 Medical Rec #:  [id number]       Height:       68.0 in Accession #:    [id number]      Weight:       183.0 lb Date of Birth:  November 20, 1965       BSA:          1.968 m Patient Age:    57 years        BP:           122/96 mmHg Patient Gender: M               HR:           77 bpm. Exam Location:  Inpatient Procedure: 2D Echo, Cardiac Doppler and Color Doppler Indications:    Atrial Fibrillation I48.91  History:        Patient has prior history of Echocardiogram examinations, most  reviewed and demonstrates:  outlined above  Relevant CV Studies: 2d echo 03/24/23   1. Left ventricular ejection fraction, by estimation, is 50 to 55%. The  left ventricle has low normal function. The left ventricle has no regional  wall motion abnormalities. Left ventricular diastolic parameters are  consistent with Grade I diastolic  dysfunction (impaired relaxation).   2. Right ventricular systolic function is normal. The right ventricular  size is normal. There is normal pulmonary artery systolic pressure. The  estimated right ventricular systolic pressure is 29.5 mmHg.   3. The mitral valve is grossly normal. Trivial mitral valve  regurgitation. No evidence of mitral stenosis.   4. The aortic valve is tricuspid. Aortic valve regurgitation is not  visualized. No aortic stenosis is present.   5. The inferior vena cava is dilated in size with >50% respiratory  variability, suggesting right atrial pressure of 8 mmHg.    Laboratory Data:  High Sensitivity Troponin:  No results for input(s): "TROPONINIHS" in the last 720 hours.   Chemistry Recent Labs  Lab 03/23/23 0908 03/23/23 1605 03/24/23 0525 03/25/23 0502  NA 132*  --  135 136  K 3.6  --  3.8 4.3  CL 95*  --  99 100  CO2 25  --  25 28  GLUCOSE 169*  --  107* 119*  BUN 29*  --  19 17  CREATININE 1.24  --  0.95 0.94  CALCIUM 9.4  --  8.8* 9.1  MG  --  1.8  --   --   GFRNONAA >60  --  >60 >60  ANIONGAP 12  --  11 8    Recent Labs  Lab 03/23/23 0908  PROT 7.6  ALBUMIN 4.2  AST 87*  ALT 80*  ALKPHOS 77  BILITOT 1.6*   Lipids No results for input(s): "CHOL", "TRIG", "HDL", "LABVLDL", "LDLCALC", "CHOLHDL" in the last 168 hours.  Hematology Recent Labs  Lab 03/23/23 0908 03/24/23 0525  WBC 9.1 6.2  RBC 4.47 3.95*  HGB 15.7 13.8  HCT 44.5 40.7  MCV 99.6 103.0*  MCH 35.1* 34.9*  MCHC 35.3 33.9  RDW 13.2 13.1  PLT 205 179   Thyroid  Recent Labs  Lab 03/23/23 1605  TSH 1.034    BNPNo results for  input(s): "BNP", "PROBNP" in the last 168 hours.  DDimer No results for input(s): "DDIMER" in the last 168 hours.   Radiology/Studies:  ECHOCARDIOGRAM COMPLETE  Result Date: 03/24/2023    ECHOCARDIOGRAM REPORT   Patient Name:   Lucas [facility] Date of Exam: 03/24/2023 Medical Rec #:  [id number]       Height:       68.0 in Accession #:    [id number]      Weight:       183.0 lb Date of Birth:  November 20, 1965       BSA:          1.968 m Patient Age:    57 years        BP:           122/96 mmHg Patient Gender: M               HR:           77 bpm. Exam Location:  Inpatient Procedure: 2D Echo, Cardiac Doppler and Color Doppler Indications:    Atrial Fibrillation I48.91  History:        Patient has prior history of Echocardiogram examinations, most  Cardiology Consultation   Patient ID: Lucas Small MRN: [id number]; DOB: 01/08/1966  Admit date: 03/23/2023 Date of Consult: 03/25/2023  PCP:  [facility]   [facility] Providers Cardiologist: Remotely Dr. Rennis Small (2014), Dr. Royann Small (2018) Click here to update MD or APP on Care Team, Refresh:1}     Patient Profile:   Lucas Small is a 57 y.o. male with a hx of HTN, DM, bipolar disorder, sleep apnea, alcoholism, HLD, tobacco abuse, diastolic dysfunction, coronary calcification by prior coronary CT, prior complex medical admission at OSH with multiple abdominal surgeries who is being seen 03/25/2023 for the evaluation of atrial fibrillation/flutter at the request of Dr. Jacqulyn Small.  History of Present Illness:   Lucas Small remotely saw Dr. Rennis Small in 2014 for dyspnea. He had previously had a complex admission at outside facility with multisystem organ failure with septic shock related to sigmoid diverticulitis. He had had prolonged intubation, tracheostomy, transient heart failure and renal failure. He reported that he had 16 surgeries, including removal of a large part of his abdominal muscles. At that visit the plan was to get records and consider echo versus stress testing. He also had had a history of depression and alcohol abuse, with prior hospitalization for withdrawal and SI. EF was normal by scanned echo 2013. He did not return for follow-up until seen again by Dr. Royann Small in 2018 for evaluation of mild diastolic dysfunction seen on echocardiogram in 2018, overall felt to be reassuring without clinical signs of heart failure, no further testing felt necessary with f/u advised PRN only. He recalled being re-hospitalized in [place] many years ago to have his abdominal wall incision closed, does not recall whether there were any cardiac issues, possibly a blood clot though he's not sure. He denies any known hx of AF/AFL. Of note, multiple myeloma and sickle cell  anemia were entered into patient's chart by nursing staff in 2016. The patient does not recall ever having either of these issues diagnosed therefore were removed from PMH.  He presented to the hospital this admission with nausea and vomiting. He had been drinking approximately 2 drinks a day up until he stopped completely a few days before admission. He then developed issues with appetite, n/v, and abdominal discomfort. He has chronic abdominal pain at baseline. He reports he received an injection in the ER that helped significantly, possibly Zofran + IV fluids. CT A/P showed no acute findings, + hepatic steatosis, L sided hydrocele, fat herniating into inguinal canal without associated bowel, aortic atherosclerosis. Labs showed hyponatremia of 132, transaminitis AST 87/ALT 80, TBili 1.6, TSH wnl. He was initially in sinus tach then went into atrial flutter with RVR. He was treated with diltiazem drip and converted to NSR. He was started on Toprol 50mg  yesterday. However, he subsequently has reverted intermittently back to atrial fib + atrial flutter so this was increased to 100mg  daily this AM. He has minimal awareness of arrhythmia - "something feels different" - but no overt chest pain, dyspnea, decreased endurence, syncope, or edema. This morning he is in AFib with a HR low 100s. He otherwise reports feeling well and generally back to baseline. 2D echo this admission showed EF 50-55%, G1DD, normal RV, dilated IVC.    Past Medical History:  Diagnosis Date   Alcoholic (HCC)    CHF (congestive heart failure) (HCC)    Depression    Diabetes mellitus without complication (HCC)    metformin   Drug-seeking behavior    Dyspnea  Cardiology Consultation   Patient ID: Lucas Small MRN: [id number]; DOB: 01/08/1966  Admit date: 03/23/2023 Date of Consult: 03/25/2023  PCP:  [facility]   [facility] Providers Cardiologist: Remotely Dr. Rennis Small (2014), Dr. Royann Small (2018) Click here to update MD or APP on Care Team, Refresh:1}     Patient Profile:   Lucas Small is a 57 y.o. male with a hx of HTN, DM, bipolar disorder, sleep apnea, alcoholism, HLD, tobacco abuse, diastolic dysfunction, coronary calcification by prior coronary CT, prior complex medical admission at OSH with multiple abdominal surgeries who is being seen 03/25/2023 for the evaluation of atrial fibrillation/flutter at the request of Dr. Jacqulyn Small.  History of Present Illness:   Lucas Small remotely saw Dr. Rennis Small in 2014 for dyspnea. He had previously had a complex admission at outside facility with multisystem organ failure with septic shock related to sigmoid diverticulitis. He had had prolonged intubation, tracheostomy, transient heart failure and renal failure. He reported that he had 16 surgeries, including removal of a large part of his abdominal muscles. At that visit the plan was to get records and consider echo versus stress testing. He also had had a history of depression and alcohol abuse, with prior hospitalization for withdrawal and SI. EF was normal by scanned echo 2013. He did not return for follow-up until seen again by Dr. Royann Small in 2018 for evaluation of mild diastolic dysfunction seen on echocardiogram in 2018, overall felt to be reassuring without clinical signs of heart failure, no further testing felt necessary with f/u advised PRN only. He recalled being re-hospitalized in [place] many years ago to have his abdominal wall incision closed, does not recall whether there were any cardiac issues, possibly a blood clot though he's not sure. He denies any known hx of AF/AFL. Of note, multiple myeloma and sickle cell  anemia were entered into patient's chart by nursing staff in 2016. The patient does not recall ever having either of these issues diagnosed therefore were removed from PMH.  He presented to the hospital this admission with nausea and vomiting. He had been drinking approximately 2 drinks a day up until he stopped completely a few days before admission. He then developed issues with appetite, n/v, and abdominal discomfort. He has chronic abdominal pain at baseline. He reports he received an injection in the ER that helped significantly, possibly Zofran + IV fluids. CT A/P showed no acute findings, + hepatic steatosis, L sided hydrocele, fat herniating into inguinal canal without associated bowel, aortic atherosclerosis. Labs showed hyponatremia of 132, transaminitis AST 87/ALT 80, TBili 1.6, TSH wnl. He was initially in sinus tach then went into atrial flutter with RVR. He was treated with diltiazem drip and converted to NSR. He was started on Toprol 50mg  yesterday. However, he subsequently has reverted intermittently back to atrial fib + atrial flutter so this was increased to 100mg  daily this AM. He has minimal awareness of arrhythmia - "something feels different" - but no overt chest pain, dyspnea, decreased endurence, syncope, or edema. This morning he is in AFib with a HR low 100s. He otherwise reports feeling well and generally back to baseline. 2D echo this admission showed EF 50-55%, G1DD, normal RV, dilated IVC.    Past Medical History:  Diagnosis Date   Alcoholic (HCC)    CHF (congestive heart failure) (HCC)    Depression    Diabetes mellitus without complication (HCC)    metformin   Drug-seeking behavior    Dyspnea  reviewed and demonstrates:  outlined above  Relevant CV Studies: 2d echo 03/24/23   1. Left ventricular ejection fraction, by estimation, is 50 to 55%. The  left ventricle has low normal function. The left ventricle has no regional  wall motion abnormalities. Left ventricular diastolic parameters are  consistent with Grade I diastolic  dysfunction (impaired relaxation).   2. Right ventricular systolic function is normal. The right ventricular  size is normal. There is normal pulmonary artery systolic pressure. The  estimated right ventricular systolic pressure is 29.5 mmHg.   3. The mitral valve is grossly normal. Trivial mitral valve  regurgitation. No evidence of mitral stenosis.   4. The aortic valve is tricuspid. Aortic valve regurgitation is not  visualized. No aortic stenosis is present.   5. The inferior vena cava is dilated in size with >50% respiratory  variability, suggesting right atrial pressure of 8 mmHg.    Laboratory Data:  High Sensitivity Troponin:  No results for input(s): "TROPONINIHS" in the last 720 hours.   Chemistry Recent Labs  Lab 03/23/23 0908 03/23/23 1605 03/24/23 0525 03/25/23 0502  NA 132*  --  135 136  K 3.6  --  3.8 4.3  CL 95*  --  99 100  CO2 25  --  25 28  GLUCOSE 169*  --  107* 119*  BUN 29*  --  19 17  CREATININE 1.24  --  0.95 0.94  CALCIUM 9.4  --  8.8* 9.1  MG  --  1.8  --   --   GFRNONAA >60  --  >60 >60  ANIONGAP 12  --  11 8    Recent Labs  Lab 03/23/23 0908  PROT 7.6  ALBUMIN 4.2  AST 87*  ALT 80*  ALKPHOS 77  BILITOT 1.6*   Lipids No results for input(s): "CHOL", "TRIG", "HDL", "LABVLDL", "LDLCALC", "CHOLHDL" in the last 168 hours.  Hematology Recent Labs  Lab 03/23/23 0908 03/24/23 0525  WBC 9.1 6.2  RBC 4.47 3.95*  HGB 15.7 13.8  HCT 44.5 40.7  MCV 99.6 103.0*  MCH 35.1* 34.9*  MCHC 35.3 33.9  RDW 13.2 13.1  PLT 205 179   Thyroid  Recent Labs  Lab 03/23/23 1605  TSH 1.034    BNPNo results for  input(s): "BNP", "PROBNP" in the last 168 hours.  DDimer No results for input(s): "DDIMER" in the last 168 hours.   Radiology/Studies:  ECHOCARDIOGRAM COMPLETE  Result Date: 03/24/2023    ECHOCARDIOGRAM REPORT   Patient Name:   Lucas [facility] Date of Exam: 03/24/2023 Medical Rec #:  [id number]       Height:       68.0 in Accession #:    [id number]      Weight:       183.0 lb Date of Birth:  November 20, 1965       BSA:          1.968 m Patient Age:    57 years        BP:           122/96 mmHg Patient Gender: M               HR:           77 bpm. Exam Location:  Inpatient Procedure: 2D Echo, Cardiac Doppler and Color Doppler Indications:    Atrial Fibrillation I48.91  History:        Patient has prior history of Echocardiogram examinations, most  Cardiology Consultation   Patient ID: Lucas Small MRN: [id number]; DOB: 01/08/1966  Admit date: 03/23/2023 Date of Consult: 03/25/2023  PCP:  [facility]   [facility] Providers Cardiologist: Remotely Dr. Rennis Small (2014), Dr. Royann Small (2018) Click here to update MD or APP on Care Team, Refresh:1}     Patient Profile:   Lucas Small is a 57 y.o. male with a hx of HTN, DM, bipolar disorder, sleep apnea, alcoholism, HLD, tobacco abuse, diastolic dysfunction, coronary calcification by prior coronary CT, prior complex medical admission at OSH with multiple abdominal surgeries who is being seen 03/25/2023 for the evaluation of atrial fibrillation/flutter at the request of Dr. Jacqulyn Small.  History of Present Illness:   Lucas Small remotely saw Dr. Rennis Small in 2014 for dyspnea. He had previously had a complex admission at outside facility with multisystem organ failure with septic shock related to sigmoid diverticulitis. He had had prolonged intubation, tracheostomy, transient heart failure and renal failure. He reported that he had 16 surgeries, including removal of a large part of his abdominal muscles. At that visit the plan was to get records and consider echo versus stress testing. He also had had a history of depression and alcohol abuse, with prior hospitalization for withdrawal and SI. EF was normal by scanned echo 2013. He did not return for follow-up until seen again by Dr. Royann Small in 2018 for evaluation of mild diastolic dysfunction seen on echocardiogram in 2018, overall felt to be reassuring without clinical signs of heart failure, no further testing felt necessary with f/u advised PRN only. He recalled being re-hospitalized in [place] many years ago to have his abdominal wall incision closed, does not recall whether there were any cardiac issues, possibly a blood clot though he's not sure. He denies any known hx of AF/AFL. Of note, multiple myeloma and sickle cell  anemia were entered into patient's chart by nursing staff in 2016. The patient does not recall ever having either of these issues diagnosed therefore were removed from PMH.  He presented to the hospital this admission with nausea and vomiting. He had been drinking approximately 2 drinks a day up until he stopped completely a few days before admission. He then developed issues with appetite, n/v, and abdominal discomfort. He has chronic abdominal pain at baseline. He reports he received an injection in the ER that helped significantly, possibly Zofran + IV fluids. CT A/P showed no acute findings, + hepatic steatosis, L sided hydrocele, fat herniating into inguinal canal without associated bowel, aortic atherosclerosis. Labs showed hyponatremia of 132, transaminitis AST 87/ALT 80, TBili 1.6, TSH wnl. He was initially in sinus tach then went into atrial flutter with RVR. He was treated with diltiazem drip and converted to NSR. He was started on Toprol 50mg  yesterday. However, he subsequently has reverted intermittently back to atrial fib + atrial flutter so this was increased to 100mg  daily this AM. He has minimal awareness of arrhythmia - "something feels different" - but no overt chest pain, dyspnea, decreased endurence, syncope, or edema. This morning he is in AFib with a HR low 100s. He otherwise reports feeling well and generally back to baseline. 2D echo this admission showed EF 50-55%, G1DD, normal RV, dilated IVC.    Past Medical History:  Diagnosis Date   Alcoholic (HCC)    CHF (congestive heart failure) (HCC)    Depression    Diabetes mellitus without complication (HCC)    metformin   Drug-seeking behavior    Dyspnea

## 2023-03-26 ENCOUNTER — Other Ambulatory Visit (HOSPITAL_COMMUNITY): Payer: Self-pay

## 2023-03-26 DIAGNOSIS — I4891 Unspecified atrial fibrillation: Secondary | ICD-10-CM | POA: Diagnosis not present

## 2023-03-26 LAB — GLUCOSE, CAPILLARY
Glucose-Capillary: 113 mg/dL — ABNORMAL HIGH (ref 70–99)
Glucose-Capillary: 144 mg/dL — ABNORMAL HIGH (ref 70–99)

## 2023-03-26 MED ORDER — METOPROLOL SUCCINATE ER 100 MG PO TB24
100.0000 mg | ORAL_TABLET | Freq: Every day | ORAL | 0 refills | Status: DC
  Filled 2023-03-26: qty 30, 30d supply, fill #0

## 2023-03-26 MED ORDER — APIXABAN 5 MG PO TABS
5.0000 mg | ORAL_TABLET | Freq: Two times a day (BID) | ORAL | 0 refills | Status: DC
  Filled 2023-03-26: qty 60, 30d supply, fill #0

## 2023-03-26 NOTE — Progress Notes (Signed)
AVS reviewed w/ patient who verbalized an understanding- no other questions at this time- PIV removed as documented . Pt's ride enroute to the hospital

## 2023-03-26 NOTE — TOC Transition Note (Signed)
Transition of Care [facility]) - CM/SW Discharge Note   Patient Details  Name: Lucas Small MRN: [id number] Date of Birth: 1966/01/29  [facility] CM/SW Contact:  Lanier Clam, RN Phone Number: 03/26/2023, 2:18 PM   Clinical Narrative:  d/c home no needs or orders.     Final next level of care: Home/Self Care Barriers to Discharge: No Barriers Identified   Patient Goals and CMS Choice CMS Medicare.gov Compare Post Acute Care list provided to:: Patient Choice offered to / list presented to : Patient  Discharge Placement                         Discharge Plan and Services Additional resources added to the After Visit Summary for     Discharge Planning Services: CM Consult                                 Social Determinants of Health (SDOH) Interventions SDOH Screenings   Food Insecurity: No Food Insecurity (03/23/2023)  Housing: Low Risk  (03/23/2023)  Transportation Needs: No Transportation Needs (03/23/2023)  Utilities: Not At Risk (03/23/2023)  Alcohol Screen: Medium Risk (04/08/2017)  Depression (PHQ2-9): Medium Risk (07/29/2020)  Tobacco Use: High Risk (03/23/2023)     Readmission Risk Interventions     No data to display

## 2023-03-26 NOTE — Progress Notes (Addendum)
Progress Note  Patient Name: Lucas Small Date of Encounter: 03/26/2023  Primary Cardiologist: Jodelle Red, MD  Subjective   Patient is upset this morning about being overcharged with utility bills. States he always gets easily worked up about this kind of thing. Otherwise does not have any acute complaints - "I don't feel sick."  Inpatient Medications    Scheduled Meds:  apixaban  5 mg Oral BID   insulin aspart  0-15 Units Subcutaneous TID WC   insulin aspart  0-5 Units Subcutaneous QHS   metoprolol succinate  100 mg Oral Daily   mirtazapine  15 mg Oral QHS   pantoprazole  40 mg Oral Daily   risperiDONE  1 mg Oral QHS   Continuous Infusions:  diltiazem (CARDIZEM) infusion Stopped (03/24/23 1128)   PRN Meds: acetaminophen **OR** acetaminophen, albuterol, ondansetron **OR** ondansetron (ZOFRAN) IV   Vital Signs    Vitals:   03/25/23 2240 03/25/23 2300 03/26/23 0000 03/26/23 0405  BP:    (!) 136/109  Pulse:    97  Resp: (!) 22 18 13  (!) 21  Temp:    98.1 F (36.7 C)  TempSrc:    Oral  SpO2:    94%  Weight:      Height:        Intake/Output Summary (Last 24 hours) at 03/26/2023 0927 Last data filed at 03/26/2023 0406 Gross per 24 hour  Intake 840 ml  Output 750 ml  Net 90 ml      03/23/2023    3:48 PM 03/23/2023    8:40 AM 03/23/2023    8:24 AM  Last 3 Weights  Weight (lbs) 182 lb 15.7 oz 183 lb 183 lb  Weight (kg) 83 kg 83.008 kg 83.008 kg     Telemetry    Outlined below - Personally Reviewed  Physical Exam   GEN: No acute distress.  HEENT: Normocephalic, atraumatic, sclera non-icteric. Neck: No JVD or bruits. Cardiac: irregularly irregular, borderline rate, no murmurs, rubs, or gallops.  Respiratory: Clear to auscultation bilaterally. Breathing is unlabored. GI: Soft, nontender, non-distended, BS +x 4. MS: no deformity. Extremities: No clubbing or cyanosis. No edema. Distal pedal pulses are 2+ and equal bilaterally. Neuro:   AAOx3. Follows commands. Psych:  Responds to questions appropriately with anxious affect.  Labs    High Sensitivity Troponin:  No results for input(s): "TROPONINIHS" in the last 720 hours.    Cardiac EnzymesNo results for input(s): "TROPONINI" in the last 168 hours. No results for input(s): "TROPIPOC" in the last 168 hours.   Chemistry Recent Labs  Lab 03/23/23 0908 03/24/23 0525 03/25/23 0502  NA 132* 135 136  K 3.6 3.8 4.3  CL 95* 99 100  CO2 25 25 28   GLUCOSE 169* 107* 119*  BUN 29* 19 17  CREATININE 1.24 0.95 0.94  CALCIUM 9.4 8.8* 9.1  PROT 7.6  --   --   ALBUMIN 4.2  --   --   AST 87*  --   --   ALT 80*  --   --   ALKPHOS 77  --   --   BILITOT 1.6*  --   --   GFRNONAA >60 >60 >60  ANIONGAP 12 11 8      Hematology Recent Labs  Lab 03/23/23 0908 03/24/23 0525  WBC 9.1 6.2  RBC 4.47 3.95*  HGB 15.7 13.8  HCT 44.5 40.7  MCV 99.6 103.0*  MCH 35.1* 34.9*  MCHC 35.3 33.9  RDW 13.2 13.1  PLT 205  179    BNPNo results for input(s): "BNP", "PROBNP" in the last 168 hours.   DDimer No results for input(s): "DDIMER" in the last 168 hours.   Radiology    ECHOCARDIOGRAM COMPLETE  Result Date: 03/24/2023    ECHOCARDIOGRAM REPORT   Patient Name:   Lucas Small [facility] Date of Exam: 03/24/2023 Medical Rec #:  [id number]       Height:       68.0 in Accession #:    [id number]      Weight:       183.0 lb Date of Birth:  December 23, 1965       BSA:          1.968 m Patient Age:    57 years        BP:           122/96 mmHg Patient Gender: M               HR:           77 bpm. Exam Location:  Inpatient Procedure: 2D Echo, Cardiac Doppler and Color Doppler Indications:    Atrial Fibrillation I48.91  History:        Patient has prior history of Echocardiogram examinations, most                 recent 11/02/2016. CHF, Arrythmias:Atrial Fibrillation,                 Signs/Symptoms:Dyspnea; Risk Factors:Hypertension, Diabetes,                 Sleep Apnea and Current Smoker.  Sonographer:     Lucendia Herrlich RCS Referring Phys: [id number] MIR M [facility] IMPRESSIONS  1. Left ventricular ejection fraction, by estimation, is 50 to 55%. The left ventricle has low normal function. The left ventricle has no regional wall motion abnormalities. Left ventricular diastolic parameters are consistent with Grade I diastolic dysfunction (impaired relaxation).  2. Right ventricular systolic function is normal. The right ventricular size is normal. There is normal pulmonary artery systolic pressure. The estimated right ventricular systolic pressure is 29.5 mmHg.  3. The mitral valve is grossly normal. Trivial mitral valve regurgitation. No evidence of mitral stenosis.  4. The aortic valve is tricuspid. Aortic valve regurgitation is not visualized. No aortic stenosis is present.  5. The inferior vena cava is dilated in size with >50% respiratory variability, suggesting right atrial pressure of 8 mmHg. FINDINGS  Left Ventricle: Left ventricular ejection fraction, by estimation, is 50 to 55%. The left ventricle has low normal function. The left ventricle has no regional wall motion abnormalities. The left ventricular internal cavity size was normal in size. There is no left ventricular hypertrophy. Left ventricular diastolic parameters are consistent with Grade I diastolic dysfunction (impaired relaxation). Right Ventricle: The right ventricular size is normal. No increase in right ventricular wall thickness. Right ventricular systolic function is normal. There is normal pulmonary artery systolic pressure. The tricuspid regurgitant velocity is 2.32 m/s, and  with an assumed right atrial pressure of 8 mmHg, the estimated right ventricular systolic pressure is 29.5 mmHg. Left Atrium: Left atrial size was normal in size. Right Atrium: Right atrial size was normal in size. Pericardium: There is no evidence of pericardial effusion. Mitral Valve: The mitral valve is grossly normal. Trivial mitral valve regurgitation. No evidence  of mitral valve stenosis. Tricuspid Valve: The tricuspid valve is grossly normal. Tricuspid valve regurgitation is trivial. No evidence of tricuspid stenosis. Aortic Valve: The aortic  valve is tricuspid. Aortic valve regurgitation is not visualized. No aortic stenosis is present. Aortic valve peak gradient measures 4.3 mmHg. Pulmonic Valve: The pulmonic valve was grossly normal. Pulmonic valve regurgitation is not visualized. No evidence of pulmonic stenosis. Aorta: The aortic root and ascending aorta are structurally normal, with no evidence of dilitation. Venous: The inferior vena cava is dilated in size with greater than 50% respiratory variability, suggesting right atrial pressure of 8 mmHg. IAS/Shunts: The atrial septum is grossly normal.  LEFT VENTRICLE PLAX 2D LVIDd:         4.90 cm   Diastology LVIDs:         3.40 cm   LV e' medial:    6.84 cm/s LV PW:         0.70 cm   LV E/e' medial:  5.5 LV IVS:        0.80 cm   LV e' lateral:   8.45 cm/s LVOT diam:     2.20 cm   LV E/e' lateral: 4.4 LV SV:         57 LV SV Index:   29 LVOT Area:     3.80 cm  RIGHT VENTRICLE             IVC RV S prime:     11.90 cm/s  IVC diam: 2.30 cm TAPSE (M-mode): 2.0 cm LEFT ATRIUM             Index        RIGHT ATRIUM           Index LA diam:        3.70 cm 1.88 cm/m   RA Area:     13.70 cm LA Vol (A2C):   40.7 ml 20.68 ml/m  RA Volume:   31.30 ml  15.90 ml/m LA Vol (A4C):   46.6 ml 23.68 ml/m LA Biplane Vol: 43.4 ml 22.05 ml/m  AORTIC VALVE AV Area (Vmax): 3.23 cm AV Vmax:        104.00 cm/s AV Peak Grad:   4.3 mmHg LVOT Vmax:      88.40 cm/s LVOT Vmean:     58.400 cm/s LVOT VTI:       0.151 m  AORTA Ao Root diam: 3.70 cm Ao Asc diam:  3.30 cm MITRAL VALVE               TRICUSPID VALVE MV Area (PHT): 3.63 cm    TR Peak grad:   21.5 mmHg MV Decel Time: 209 msec    TR Vmax:        232.00 cm/s MV E velocity: 37.60 cm/s MV A velocity: 76.80 cm/s  SHUNTS MV E/A ratio:  0.49        Systemic VTI:  0.15 m                             Systemic Diam: 2.20 cm Lennie Odor MD Electronically signed by Lennie Odor MD Signature Date/Time: 03/24/2023/3:29:07 PM    Final     Cardiac Studies   2d echo 03/24/23   1. Left ventricular ejection fraction, by estimation, is 50 to 55%. The  left ventricle has low normal function. The left ventricle has no regional  wall motion abnormalities. Left ventricular diastolic parameters are  consistent with Grade I diastolic  dysfunction (impaired relaxation).   2. Right ventricular systolic function is normal. The right ventricular  size is normal. There is  normal pulmonary artery systolic pressure. The  estimated right ventricular systolic pressure is 29.5 mmHg.   3. The mitral valve is grossly normal. Trivial mitral valve  regurgitation. No evidence of mitral stenosis.   4. The aortic valve is tricuspid. Aortic valve regurgitation is not  visualized. No aortic stenosis is present.   5. The inferior vena cava is dilated in size with >50% respiratory  variability, suggesting right atrial pressure of 8 mmHg.    Patient Profile     57 y.o. male with HTN, DM, bipolar disorder, sleep apnea, alcoholism, HLD, tobacco abuse, diastolic dysfunction, coronary calcification by prior coronary CT, prior remote complex medical admission at OSH with multiple abdominal surgeries who presented to the hospital with nausea, vomiting and abdominal pain. Patient's girlfriend reported patient stopped taking suboxone 2 weeks ago and started drinking more heavily than usual, then patient quit drinking 4 days before admission. Presented in sinus tachycardia with mild hyponatremia and transaminitis, but subsequently found to have intermittent AFib/Flutter with RVR during admission.  Assessment & Plan    1. Nausea, vomiting, abdominal pain - preceded by abrupt cessation of habitual ETOH use, suspect contribution to symptoms - mild transaminitis, hyponatremia noted on admission - sodium improved - further per  primary team   2. New onset paroxysmal atrial fib/flutter - re: CHADSVASC, patient had possible transient heart failure at time of septic shock a decade ago but unsure if this would count given recovery -> has h/o NIDDM (not requiring medication), HTN, aortic/coronary calcification previously reported therefore would consider score to be 3 - now on Eliquis 5mg  BID + PPI - suspect ETOH contributing to arrhythmia, rhythm control may be challenging in this setting without AAD, typically not an ablation candidate until cessation achieved, also avoiding amiodarone given elevation in LFTs, ETOH use, younger age - Toprol increased to 100mg  daily on 10/29 - appears to have converted back to NSR/ST yesterday afternoon again with brief paroxysms of rapid atrial fib/flutter this morning - presently sinus tach in the low 100s. Ambulatory HR 1-teens. Of note, his prior EKGs from 2019 show baseline sinus tachycardia as well. Will review strategy with Dr. Cristal Deer. Lowest HR appears to have been the 80s overnight. - we discussed getting home BP cuff/pulse ox to help track HR - I tentatively arranged afib clinic follow-up 11/12 and put on AVS   3. Alcoholism, prior tobacco abuse now vaping - suggest recommendation of resources at discharge   4. OSA  - continue OP follow-up for this   For questions or updates, please contact [facility] Please consult [email or web address] for contact info under Cardiology/STEMI.  Signed, Laurann Montana, PA-C 03/26/2023, 9:27 AM

## 2023-03-26 NOTE — Progress Notes (Signed)
Discharge meds given to pt in room by this RN

## 2023-03-26 NOTE — TOC Initial Note (Signed)
Transition of Care [facility]) - Initial/Assessment Note    Patient Details  Name: Lucas Small MRN: [id number] Date of Birth: 12-21-1965  Transition of Care [facility]) CM/SW Contact:    Lanier Clam, RN Phone Number: 03/26/2023, 10:05 AM  Clinical Narrative:  d/c plan home. Monitor for d/c needs.                 Expected Discharge Plan: Home/Self Care Barriers to Discharge: Continued Medical Work up   Patient Goals and CMS Choice Patient states their goals for this hospitalization and ongoing recovery are:: Home CMS Medicare.gov Compare Post Acute Care list provided to:: Patient Choice offered to / list presented to : Patient [place] ownership interest in [facility].provided to:: Patient    Expected Discharge Plan and Services   Discharge Planning Services: CM Consult   Living arrangements for the past 2 months: Single Family Home                                      Prior Living Arrangements/Services Living arrangements for the past 2 months: Single Family Home Lives with:: Spouse Patient language and need for interpreter reviewed:: Yes Do you feel safe going back to the place where you live?: Yes      Need for Family Participation in Patient Care: Yes (Comment) Care giver support system in place?: Yes (comment)   Criminal Activity/Legal Involvement Pertinent to Current Situation/Hospitalization: No - Comment as needed  Activities of Daily Living   ADL Screening (condition at time of admission) Independently performs ADLs?: Yes (appropriate for developmental age) Does the patient have a NEW difficulty with bathing/dressing/toileting/self-feeding that is expected to last >3 days?: No Does the patient have a NEW difficulty with getting in/out of bed, walking, or climbing stairs that is expected to last >3 days?: No Does the patient have a NEW difficulty with communication that is expected to last >3 days?: No Is the patient deaf or have difficulty  hearing?: Yes Does the patient have difficulty seeing, even when wearing glasses/contacts?: Yes Does the patient have difficulty concentrating, remembering, or making decisions?: Yes  Permission Sought/Granted Permission sought to share information with : Case Manager Permission granted to share information with : Yes, Verbal Permission Granted  Share Information with NAME: Case manager           Emotional Assessment Appearance:: Appears stated age Attitude/Demeanor/Rapport: Gracious Affect (typically observed): Accepting Orientation: : Oriented to Self, Oriented to Place, Oriented to  Time, Oriented to Situation Alcohol / Substance Use: Not Applicable    Admission diagnosis:  Nausea [R11.0] Atrial fibrillation with RVR (HCC) [I48.91] Atrial fibrillation, unspecified type (HCC) [I48.91] Nausea and vomiting, unspecified vomiting type [R11.2] Patient Active Problem List   Diagnosis Date Noted   Atrial fibrillation with RVR (HCC) 03/23/2023   Essential hypertension 12/10/2017   History of suicide attempt    Echocardiogram shows left ventricular diastolic dysfunction 01/12/2017   Alcohol abuse with alcohol-induced mood disorder (HCC) 12/28/2016   Obesity (BMI 30-39.9) 11/13/2016   MDD (major depressive disorder), recurrent severe, without psychosis (HCC) 10/05/2016   OSA (obstructive sleep apnea) 04/25/2016   Back pain 12/15/2015   GERD (gastroesophageal reflux disease) 12/15/2015   DM (diabetes mellitus) type II, controlled, with peripheral vascular disorder (HCC) 12/15/2015   Hyperlipidemia LDL goal <70 12/15/2015   Diverticulitis 03/05/2013   Tobacco dependency 03/05/2013   DOE (dyspnea on exertion) 03/05/2013  Major depression, recurrent (HCC) 11/24/2012   Snoring 08/27/2011   Substance induced mood disorder (HCC) 08/23/2011    Class: Acute   Alcohol use disorder, severe, dependence (HCC) 08/23/2011    Class: Acute   PCP:  [facility]:    [id number] [place], [place] - [address] AT [facility] [address] [place] [address] Phone: [phone] Fax: [phone]     Social Determinants of Health (SDOH) Social History: SDOH Screenings   Food Insecurity: No Food Insecurity (03/23/2023)  Housing: Low Risk  (03/23/2023)  Transportation Needs: No Transportation Needs (03/23/2023)  Utilities: Not At Risk (03/23/2023)  Alcohol Screen: Medium Risk (04/08/2017)  Depression (PHQ2-9): Medium Risk (07/29/2020)  Tobacco Use: High Risk (03/23/2023)   SDOH Interventions:     Readmission Risk Interventions     No data to display

## 2023-03-26 NOTE — Discharge Summary (Signed)
Physician Discharge Summary  Lucas Small EXB:[id number] DOB: 1966-01-02 DOA: 03/23/2023  PCP: [facility]  Admit date: 03/23/2023 Discharge date: 03/26/2023 30 Day Unplanned Readmission Risk Score    Flowsheet Row ED to Hosp-Admission (Current) from 03/23/2023 in [place] 4TH FLOOR PROGRESSIVE CARE AND UROLOGY  30 Day Unplanned Readmission Risk Score (%) 13.48 Filed at 03/26/2023 1200       This score is the patient's risk of an unplanned readmission within 30 days of being discharged (0 -100%). The score is based on dignosis, age, lab data, medications, orders, and past utilization.   Low:  0-14.9   Medium: 15-21.9   High: 22-29.9   Extreme: 30 and above          Admitted From: Home Disposition: Home  Recommendations for Outpatient Follow-up:  Follow up with PCP in 1-2 weeks Please obtain BMP/CBC in one week Please follow up with your PCP on the following pending results: Unresulted Labs (From admission, onward)    None         Home Health: None Equipment/Devices: None  Discharge Condition: Stable CODE STATUS: Full code Diet recommendation: Cardiac  Subjective: Seen and examined.  No complaints.  Girlfriend at the bedside.  Brief/Interim Summary: Lucas Small is a 57 y.o. male with medical history significant for congestive heart failure, non-insulin-dependent diabetes, depression, hypertension presented to the emergency department with several days of nausea and vomiting.  He denied any palpitations, chest pain, dizziness, fevers, chills, diarrhea.  Evaluation in the emergency department as below no acute abdominal process, he felt better and was able to tolerate an oral diet.  However, while in the emergency department he was noted to become more tachycardic, A-fib with RVR was noted on the monitor.  He has no prior history of this, though he says that when he was hospitalized for a couple of months in [place] many years ago, he did have some  problems with heart failure and some other problems that he is not aware of.  He also states that when he was discharged from the hospital, he was on a blood thinner for just a couple of weeks, had a clot somewhere not sure where.  Regardless, he was hospitalized for New onset atrial fibrillation with RVR: Initially required Cardizem drip and subsequently transitioned to oral Toprol-XL 50 mg.  Went into atrial fibrillation overnight again for 20 minutes and heart rate had remained around 110 even at rest.  Toprol-XL increased to 100 mg p.o. daily.  Then when he was walking with the nurse, he went into atrial fibrillation again with a heart rate of 180.  At that point, cardiology consulted.  They did not make any changes, recommended to continue current management.  We watched him another 24 hours, it was noted that at times he spikes his heart rate up to 110s without having any symptoms.  Cardiology has recommended that he can be discharged home.  They have arranged for him to follow-up with them on November 12.  He is aware of the time location as well as date.  He has been started on Eliquis as well.  All the advantages and disadvantages of the anticoagulation have been discussed with the patient and he has verbalized understanding and agreed with this.  CHA2DS2-VASc score is 2.   GERD: Continue PPI.   Type 2 diabetes mellitus: Hemoglobin A1c 6.  Diet controlled.   Essential hypertension: No previous history of hypertension but blood pressure elevated in the range of  class I hypertension.  His blood pressure remained labile, dropping to 122/89 and rising to 136/109 within 24 hours so at this point in time, we are discharging him on Toprol-XL and will defer to PCP to further adjust medications.   Chronic diastolic congestive heart failure: Echo shows grade 1 diastolic dysfunction with midrange ejection fraction 50 to 55%.  Discharge plan was discussed with patient and/or family member and they verbalized  understanding and agreed with it.  Discharge Diagnoses:  Principal Problem:   Atrial fibrillation with RVR (HCC) Active Problems:   GERD (gastroesophageal reflux disease)   DM (diabetes mellitus) type II, controlled, with peripheral vascular disorder [facility])   Essential hypertension    Discharge Instructions   Allergies as of 03/26/2023       Reactions   Bee Venom Anaphylaxis        Medication List     TAKE these medications    apixaban 5 MG Tabs tablet Commonly known as: ELIQUIS Take 1 tablet (5 mg total) by mouth 2 (two) times daily.   cetirizine 10 MG tablet Commonly known as: ZYRTEC Take 10 mg by mouth at bedtime.   cyclobenzaprine 10 MG tablet Commonly known as: FLEXERIL Take 1 tablet (10 mg total) by mouth at bedtime.   gabapentin 300 MG capsule Commonly known as: NEURONTIN Take 1 capsule (300 mg total) by mouth 3 (three) times daily. What changed:  when to take this reasons to take this   metoprolol succinate 100 MG 24 hr tablet Commonly known as: TOPROL-XL Take 1 tablet (100 mg total) by mouth daily. Take with or immediately following a meal. Start taking on: March 27, 2023   mirtazapine 15 MG tablet Commonly known as: Remeron Take 1 tablet (15 mg total) by mouth at bedtime.   pantoprazole 40 MG tablet Commonly known as: PROTONIX Take 1 tablet (40 mg total) by mouth daily.   risperiDONE 1 MG tablet Commonly known as: RisperDAL Take one tab at bed time. What changed:  how much to take how to take this when to take this   Vitamin D (Ergocalciferol) 1.25 MG (50000 UNIT) Caps capsule Commonly known as: DRISDOL Take 50,000 Units by mouth 2 (two) times a week.        Follow-up Information     Fenton, Clint R, PA Follow up.   Specialty: Cardiology Why: Follow-up scheduled with Jorja Loa, PA-C at the [facility] on the 6th floor of [facility] on Tuesday Apr 09, 2023 2:00 PM. Arrive 15 minutes prior to appointment  to check in. Contact information: [address] [place] [address] [phone]         [facility] Follow up in 1 week(s).   Contact information: [address] [place] [address] [phone]                Allergies  Allergen Reactions   Bee Venom Anaphylaxis    Consultations: Cardiology   Procedures/Studies: ECHOCARDIOGRAM COMPLETE  Result Date: 03/24/2023    ECHOCARDIOGRAM REPORT   Patient Name:   Lucas Small [facility] Date of Exam: 03/24/2023 Medical Rec #:  [id number]       Height:       68.0 in Accession #:    [id number]      Weight:       183.0 lb Date of Birth:  Oct 01, 1965       BSA:          1.968 m Patient Age:  class I hypertension.  His blood pressure remained labile, dropping to 122/89 and rising to 136/109 within 24 hours so at this point in time, we are discharging him on Toprol-XL and will defer to PCP to further adjust medications.   Chronic diastolic congestive heart failure: Echo shows grade 1 diastolic dysfunction with midrange ejection fraction 50 to 55%.  Discharge plan was discussed with patient and/or family member and they verbalized  understanding and agreed with it.  Discharge Diagnoses:  Principal Problem:   Atrial fibrillation with RVR (HCC) Active Problems:   GERD (gastroesophageal reflux disease)   DM (diabetes mellitus) type II, controlled, with peripheral vascular disorder [facility])   Essential hypertension    Discharge Instructions   Allergies as of 03/26/2023       Reactions   Bee Venom Anaphylaxis        Medication List     TAKE these medications    apixaban 5 MG Tabs tablet Commonly known as: ELIQUIS Take 1 tablet (5 mg total) by mouth 2 (two) times daily.   cetirizine 10 MG tablet Commonly known as: ZYRTEC Take 10 mg by mouth at bedtime.   cyclobenzaprine 10 MG tablet Commonly known as: FLEXERIL Take 1 tablet (10 mg total) by mouth at bedtime.   gabapentin 300 MG capsule Commonly known as: NEURONTIN Take 1 capsule (300 mg total) by mouth 3 (three) times daily. What changed:  when to take this reasons to take this   metoprolol succinate 100 MG 24 hr tablet Commonly known as: TOPROL-XL Take 1 tablet (100 mg total) by mouth daily. Take with or immediately following a meal. Start taking on: March 27, 2023   mirtazapine 15 MG tablet Commonly known as: Remeron Take 1 tablet (15 mg total) by mouth at bedtime.   pantoprazole 40 MG tablet Commonly known as: PROTONIX Take 1 tablet (40 mg total) by mouth daily.   risperiDONE 1 MG tablet Commonly known as: RisperDAL Take one tab at bed time. What changed:  how much to take how to take this when to take this   Vitamin D (Ergocalciferol) 1.25 MG (50000 UNIT) Caps capsule Commonly known as: DRISDOL Take 50,000 Units by mouth 2 (two) times a week.        Follow-up Information     Fenton, Clint R, PA Follow up.   Specialty: Cardiology Why: Follow-up scheduled with Jorja Loa, PA-C at the [facility] on the 6th floor of [facility] on Tuesday Apr 09, 2023 2:00 PM. Arrive 15 minutes prior to appointment  to check in. Contact information: [address] [place] [address] [phone]         [facility] Follow up in 1 week(s).   Contact information: [address] [place] [address] [phone]                Allergies  Allergen Reactions   Bee Venom Anaphylaxis    Consultations: Cardiology   Procedures/Studies: ECHOCARDIOGRAM COMPLETE  Result Date: 03/24/2023    ECHOCARDIOGRAM REPORT   Patient Name:   Lucas Small [facility] Date of Exam: 03/24/2023 Medical Rec #:  [id number]       Height:       68.0 in Accession #:    [id number]      Weight:       183.0 lb Date of Birth:  Oct 01, 1965       BSA:          1.968 m Patient Age:  class I hypertension.  His blood pressure remained labile, dropping to 122/89 and rising to 136/109 within 24 hours so at this point in time, we are discharging him on Toprol-XL and will defer to PCP to further adjust medications.   Chronic diastolic congestive heart failure: Echo shows grade 1 diastolic dysfunction with midrange ejection fraction 50 to 55%.  Discharge plan was discussed with patient and/or family member and they verbalized  understanding and agreed with it.  Discharge Diagnoses:  Principal Problem:   Atrial fibrillation with RVR (HCC) Active Problems:   GERD (gastroesophageal reflux disease)   DM (diabetes mellitus) type II, controlled, with peripheral vascular disorder [facility])   Essential hypertension    Discharge Instructions   Allergies as of 03/26/2023       Reactions   Bee Venom Anaphylaxis        Medication List     TAKE these medications    apixaban 5 MG Tabs tablet Commonly known as: ELIQUIS Take 1 tablet (5 mg total) by mouth 2 (two) times daily.   cetirizine 10 MG tablet Commonly known as: ZYRTEC Take 10 mg by mouth at bedtime.   cyclobenzaprine 10 MG tablet Commonly known as: FLEXERIL Take 1 tablet (10 mg total) by mouth at bedtime.   gabapentin 300 MG capsule Commonly known as: NEURONTIN Take 1 capsule (300 mg total) by mouth 3 (three) times daily. What changed:  when to take this reasons to take this   metoprolol succinate 100 MG 24 hr tablet Commonly known as: TOPROL-XL Take 1 tablet (100 mg total) by mouth daily. Take with or immediately following a meal. Start taking on: March 27, 2023   mirtazapine 15 MG tablet Commonly known as: Remeron Take 1 tablet (15 mg total) by mouth at bedtime.   pantoprazole 40 MG tablet Commonly known as: PROTONIX Take 1 tablet (40 mg total) by mouth daily.   risperiDONE 1 MG tablet Commonly known as: RisperDAL Take one tab at bed time. What changed:  how much to take how to take this when to take this   Vitamin D (Ergocalciferol) 1.25 MG (50000 UNIT) Caps capsule Commonly known as: DRISDOL Take 50,000 Units by mouth 2 (two) times a week.        Follow-up Information     Fenton, Clint R, PA Follow up.   Specialty: Cardiology Why: Follow-up scheduled with Jorja Loa, PA-C at the [facility] on the 6th floor of [facility] on Tuesday Apr 09, 2023 2:00 PM. Arrive 15 minutes prior to appointment  to check in. Contact information: [address] [place] [address] [phone]         [facility] Follow up in 1 week(s).   Contact information: [address] [place] [address] [phone]                Allergies  Allergen Reactions   Bee Venom Anaphylaxis    Consultations: Cardiology   Procedures/Studies: ECHOCARDIOGRAM COMPLETE  Result Date: 03/24/2023    ECHOCARDIOGRAM REPORT   Patient Name:   Lucas Small [facility] Date of Exam: 03/24/2023 Medical Rec #:  [id number]       Height:       68.0 in Accession #:    [id number]      Weight:       183.0 lb Date of Birth:  Oct 01, 1965       BSA:          1.968 m Patient Age:  class I hypertension.  His blood pressure remained labile, dropping to 122/89 and rising to 136/109 within 24 hours so at this point in time, we are discharging him on Toprol-XL and will defer to PCP to further adjust medications.   Chronic diastolic congestive heart failure: Echo shows grade 1 diastolic dysfunction with midrange ejection fraction 50 to 55%.  Discharge plan was discussed with patient and/or family member and they verbalized  understanding and agreed with it.  Discharge Diagnoses:  Principal Problem:   Atrial fibrillation with RVR (HCC) Active Problems:   GERD (gastroesophageal reflux disease)   DM (diabetes mellitus) type II, controlled, with peripheral vascular disorder [facility])   Essential hypertension    Discharge Instructions   Allergies as of 03/26/2023       Reactions   Bee Venom Anaphylaxis        Medication List     TAKE these medications    apixaban 5 MG Tabs tablet Commonly known as: ELIQUIS Take 1 tablet (5 mg total) by mouth 2 (two) times daily.   cetirizine 10 MG tablet Commonly known as: ZYRTEC Take 10 mg by mouth at bedtime.   cyclobenzaprine 10 MG tablet Commonly known as: FLEXERIL Take 1 tablet (10 mg total) by mouth at bedtime.   gabapentin 300 MG capsule Commonly known as: NEURONTIN Take 1 capsule (300 mg total) by mouth 3 (three) times daily. What changed:  when to take this reasons to take this   metoprolol succinate 100 MG 24 hr tablet Commonly known as: TOPROL-XL Take 1 tablet (100 mg total) by mouth daily. Take with or immediately following a meal. Start taking on: March 27, 2023   mirtazapine 15 MG tablet Commonly known as: Remeron Take 1 tablet (15 mg total) by mouth at bedtime.   pantoprazole 40 MG tablet Commonly known as: PROTONIX Take 1 tablet (40 mg total) by mouth daily.   risperiDONE 1 MG tablet Commonly known as: RisperDAL Take one tab at bed time. What changed:  how much to take how to take this when to take this   Vitamin D (Ergocalciferol) 1.25 MG (50000 UNIT) Caps capsule Commonly known as: DRISDOL Take 50,000 Units by mouth 2 (two) times a week.        Follow-up Information     Fenton, Clint R, PA Follow up.   Specialty: Cardiology Why: Follow-up scheduled with Jorja Loa, PA-C at the [facility] on the 6th floor of [facility] on Tuesday Apr 09, 2023 2:00 PM. Arrive 15 minutes prior to appointment  to check in. Contact information: [address] [place] [address] [phone]         [facility] Follow up in 1 week(s).   Contact information: [address] [place] [address] [phone]                Allergies  Allergen Reactions   Bee Venom Anaphylaxis    Consultations: Cardiology   Procedures/Studies: ECHOCARDIOGRAM COMPLETE  Result Date: 03/24/2023    ECHOCARDIOGRAM REPORT   Patient Name:   Lucas Small [facility] Date of Exam: 03/24/2023 Medical Rec #:  [id number]       Height:       68.0 in Accession #:    [id number]      Weight:       183.0 lb Date of Birth:  Oct 01, 1965       BSA:          1.968 m Patient Age:  class I hypertension.  His blood pressure remained labile, dropping to 122/89 and rising to 136/109 within 24 hours so at this point in time, we are discharging him on Toprol-XL and will defer to PCP to further adjust medications.   Chronic diastolic congestive heart failure: Echo shows grade 1 diastolic dysfunction with midrange ejection fraction 50 to 55%.  Discharge plan was discussed with patient and/or family member and they verbalized  understanding and agreed with it.  Discharge Diagnoses:  Principal Problem:   Atrial fibrillation with RVR (HCC) Active Problems:   GERD (gastroesophageal reflux disease)   DM (diabetes mellitus) type II, controlled, with peripheral vascular disorder [facility])   Essential hypertension    Discharge Instructions   Allergies as of 03/26/2023       Reactions   Bee Venom Anaphylaxis        Medication List     TAKE these medications    apixaban 5 MG Tabs tablet Commonly known as: ELIQUIS Take 1 tablet (5 mg total) by mouth 2 (two) times daily.   cetirizine 10 MG tablet Commonly known as: ZYRTEC Take 10 mg by mouth at bedtime.   cyclobenzaprine 10 MG tablet Commonly known as: FLEXERIL Take 1 tablet (10 mg total) by mouth at bedtime.   gabapentin 300 MG capsule Commonly known as: NEURONTIN Take 1 capsule (300 mg total) by mouth 3 (three) times daily. What changed:  when to take this reasons to take this   metoprolol succinate 100 MG 24 hr tablet Commonly known as: TOPROL-XL Take 1 tablet (100 mg total) by mouth daily. Take with or immediately following a meal. Start taking on: March 27, 2023   mirtazapine 15 MG tablet Commonly known as: Remeron Take 1 tablet (15 mg total) by mouth at bedtime.   pantoprazole 40 MG tablet Commonly known as: PROTONIX Take 1 tablet (40 mg total) by mouth daily.   risperiDONE 1 MG tablet Commonly known as: RisperDAL Take one tab at bed time. What changed:  how much to take how to take this when to take this   Vitamin D (Ergocalciferol) 1.25 MG (50000 UNIT) Caps capsule Commonly known as: DRISDOL Take 50,000 Units by mouth 2 (two) times a week.        Follow-up Information     Fenton, Clint R, PA Follow up.   Specialty: Cardiology Why: Follow-up scheduled with Jorja Loa, PA-C at the [facility] on the 6th floor of [facility] on Tuesday Apr 09, 2023 2:00 PM. Arrive 15 minutes prior to appointment  to check in. Contact information: [address] [place] [address] [phone]         [facility] Follow up in 1 week(s).   Contact information: [address] [place] [address] [phone]                Allergies  Allergen Reactions   Bee Venom Anaphylaxis    Consultations: Cardiology   Procedures/Studies: ECHOCARDIOGRAM COMPLETE  Result Date: 03/24/2023    ECHOCARDIOGRAM REPORT   Patient Name:   Lucas Small [facility] Date of Exam: 03/24/2023 Medical Rec #:  [id number]       Height:       68.0 in Accession #:    [id number]      Weight:       183.0 lb Date of Birth:  Oct 01, 1965       BSA:          1.968 m Patient Age:  Physician Discharge Summary  Lucas Small EXB:[id number] DOB: 1966-01-02 DOA: 03/23/2023  PCP: [facility]  Admit date: 03/23/2023 Discharge date: 03/26/2023 30 Day Unplanned Readmission Risk Score    Flowsheet Row ED to Hosp-Admission (Current) from 03/23/2023 in [place] 4TH FLOOR PROGRESSIVE CARE AND UROLOGY  30 Day Unplanned Readmission Risk Score (%) 13.48 Filed at 03/26/2023 1200       This score is the patient's risk of an unplanned readmission within 30 days of being discharged (0 -100%). The score is based on dignosis, age, lab data, medications, orders, and past utilization.   Low:  0-14.9   Medium: 15-21.9   High: 22-29.9   Extreme: 30 and above          Admitted From: Home Disposition: Home  Recommendations for Outpatient Follow-up:  Follow up with PCP in 1-2 weeks Please obtain BMP/CBC in one week Please follow up with your PCP on the following pending results: Unresulted Labs (From admission, onward)    None         Home Health: None Equipment/Devices: None  Discharge Condition: Stable CODE STATUS: Full code Diet recommendation: Cardiac  Subjective: Seen and examined.  No complaints.  Girlfriend at the bedside.  Brief/Interim Summary: Lucas Small is a 57 y.o. male with medical history significant for congestive heart failure, non-insulin-dependent diabetes, depression, hypertension presented to the emergency department with several days of nausea and vomiting.  He denied any palpitations, chest pain, dizziness, fevers, chills, diarrhea.  Evaluation in the emergency department as below no acute abdominal process, he felt better and was able to tolerate an oral diet.  However, while in the emergency department he was noted to become more tachycardic, A-fib with RVR was noted on the monitor.  He has no prior history of this, though he says that when he was hospitalized for a couple of months in [place] many years ago, he did have some  problems with heart failure and some other problems that he is not aware of.  He also states that when he was discharged from the hospital, he was on a blood thinner for just a couple of weeks, had a clot somewhere not sure where.  Regardless, he was hospitalized for New onset atrial fibrillation with RVR: Initially required Cardizem drip and subsequently transitioned to oral Toprol-XL 50 mg.  Went into atrial fibrillation overnight again for 20 minutes and heart rate had remained around 110 even at rest.  Toprol-XL increased to 100 mg p.o. daily.  Then when he was walking with the nurse, he went into atrial fibrillation again with a heart rate of 180.  At that point, cardiology consulted.  They did not make any changes, recommended to continue current management.  We watched him another 24 hours, it was noted that at times he spikes his heart rate up to 110s without having any symptoms.  Cardiology has recommended that he can be discharged home.  They have arranged for him to follow-up with them on November 12.  He is aware of the time location as well as date.  He has been started on Eliquis as well.  All the advantages and disadvantages of the anticoagulation have been discussed with the patient and he has verbalized understanding and agreed with this.  CHA2DS2-VASc score is 2.   GERD: Continue PPI.   Type 2 diabetes mellitus: Hemoglobin A1c 6.  Diet controlled.   Essential hypertension: No previous history of hypertension but blood pressure elevated in the range of

## 2023-03-26 NOTE — Plan of Care (Signed)

## 2023-04-01 ENCOUNTER — Encounter (HOSPITAL_COMMUNITY): Payer: Self-pay | Admitting: Emergency Medicine

## 2023-04-01 ENCOUNTER — Emergency Department (HOSPITAL_COMMUNITY)
Admission: EM | Admit: 2023-04-01 | Discharge: 2023-04-01 | Discharge status: Against Medical Advice | Payer: PPO | Attending: Emergency Medicine | Admitting: Emergency Medicine

## 2023-04-01 ENCOUNTER — Other Ambulatory Visit: Payer: Self-pay

## 2023-04-01 DIAGNOSIS — Z5321 Procedure and treatment not carried out due to patient leaving prior to being seen by health care provider: Secondary | ICD-10-CM | POA: Diagnosis not present

## 2023-04-01 DIAGNOSIS — R1030 Lower abdominal pain, unspecified: Secondary | ICD-10-CM | POA: Insufficient documentation

## 2023-04-01 DIAGNOSIS — R030 Elevated blood-pressure reading, without diagnosis of hypertension: Secondary | ICD-10-CM | POA: Diagnosis present

## 2023-04-01 DIAGNOSIS — R1110 Vomiting, unspecified: Secondary | ICD-10-CM | POA: Diagnosis not present

## 2023-04-01 NOTE — ED Triage Notes (Signed)
Patient arrives ambulatory by POV with significant other stating patient was recently admitted to hospital and today noticed patients BP was 210/110. States patient seemed fatigue after doing minor things. Was started on eliquis and metoprolol 10/29. Has appt with cardiology Nov 12th.

## 2023-04-01 NOTE — ED Provider Triage Note (Signed)
Emergency Medicine Provider Triage Evaluation Note  Lucas Small , a 57 y.o. male  was evaluated in triage.  Pt complains of high blood pressure readings from wrist blood pressure cuff at home reading to 210/100.  He also complains of lower abdominal pain and intractable vomiting with oral intake for the past couple weeks.  Review of Systems  Positive: Lower abdominal pain, high blood pressure Negative: Fevers  Physical Exam  BP (!) 156/106 (BP Location: Left Arm)   Pulse 92   Temp 98.3 F (36.8 C) (Oral)   Resp 18   Ht 5\' 8"  (1.727 m)   Wt 83 kg   SpO2 97%   BMI 27.82 kg/m  Gen:   Awake, no distress   Resp:  Normal effort  MSK:   Moves extremities without difficulty  Other:    Medical Decision Making  Medically screening exam initiated at 4:55 PM.  Appropriate orders placed.  BAIN WHICHARD was informed that the remainder of the evaluation will be completed by another provider, this initial triage assessment does not replace that evaluation, and the importance of remaining in the ED until their evaluation is complete.  Following initial evaluation, patient reports that his blood pressure is a lot better and would like to leave the hospital without further evaluation, lab work.  I told him that since we do not have an etiology of intractable vomiting that this would be an AMA.  I talked to Dr. Jeraldine Loots regarding this and he agreed that this would be an AMA but could prescribe Zofran.  However, when I attempted to talk to patient again and update them on my conversation with Dr. Jeraldine Loots, he left the hospital without discharge paperwork, AMA discussion, or return to emergency department precautions.    Judithann Sheen, PA 04/01/23 [phone]

## 2023-04-09 ENCOUNTER — Ambulatory Visit (HOSPITAL_COMMUNITY)
Admit: 2023-04-09 | Discharge: 2023-04-09 | Disposition: A | Discharge status: Home / Self Care | Payer: PPO | Source: Ambulatory Visit | Attending: Physician Assistant | Admitting: Physician Assistant

## 2023-04-09 ENCOUNTER — Encounter (HOSPITAL_COMMUNITY): Payer: Self-pay | Admitting: Physician Assistant

## 2023-04-09 ENCOUNTER — Inpatient Hospital Stay (HOSPITAL_COMMUNITY)
Admission: RE | Admit: 2023-04-09 | Discharge: 2023-04-09 | Disposition: A | Discharge status: Home / Self Care | Payer: PPO | Source: Ambulatory Visit | Attending: Physician Assistant

## 2023-04-09 VITALS — BP 104/84 | HR 94 | Ht 68.0 in | Wt 185.2 lb

## 2023-04-09 DIAGNOSIS — F1010 Alcohol abuse, uncomplicated: Secondary | ICD-10-CM | POA: Insufficient documentation

## 2023-04-09 DIAGNOSIS — I480 Paroxysmal atrial fibrillation: Secondary | ICD-10-CM | POA: Insufficient documentation

## 2023-04-09 DIAGNOSIS — I4891 Unspecified atrial fibrillation: Secondary | ICD-10-CM

## 2023-04-09 DIAGNOSIS — I2510 Atherosclerotic heart disease of native coronary artery without angina pectoris: Secondary | ICD-10-CM | POA: Insufficient documentation

## 2023-04-09 DIAGNOSIS — I4892 Unspecified atrial flutter: Secondary | ICD-10-CM | POA: Insufficient documentation

## 2023-04-09 DIAGNOSIS — E785 Hyperlipidemia, unspecified: Secondary | ICD-10-CM | POA: Diagnosis not present

## 2023-04-09 DIAGNOSIS — D6869 Other thrombophilia: Secondary | ICD-10-CM | POA: Insufficient documentation

## 2023-04-09 DIAGNOSIS — Z7901 Long term (current) use of anticoagulants: Secondary | ICD-10-CM | POA: Insufficient documentation

## 2023-04-09 DIAGNOSIS — E119 Type 2 diabetes mellitus without complications: Secondary | ICD-10-CM | POA: Insufficient documentation

## 2023-04-09 DIAGNOSIS — I110 Hypertensive heart disease with heart failure: Secondary | ICD-10-CM | POA: Diagnosis not present

## 2023-04-09 DIAGNOSIS — G4733 Obstructive sleep apnea (adult) (pediatric): Secondary | ICD-10-CM | POA: Insufficient documentation

## 2023-04-09 DIAGNOSIS — Z9889 Other specified postprocedural states: Secondary | ICD-10-CM | POA: Insufficient documentation

## 2023-04-09 DIAGNOSIS — I509 Heart failure, unspecified: Secondary | ICD-10-CM | POA: Insufficient documentation

## 2023-04-09 MED ORDER — APIXABAN 5 MG PO TABS: 5.0000 mg | ORAL_TABLET | Freq: Two times a day (BID) | ORAL | 4 refills | Status: DC

## 2023-04-09 MED ORDER — METOPROLOL SUCCINATE ER 100 MG PO TB24: 100.0000 mg | ORAL_TABLET | Freq: Every day | ORAL | 4 refills | Status: DC

## 2023-04-09 NOTE — Progress Notes (Signed)
Primary Care Physician: [facility] Primary Cardiologist: Jodelle Red, MD Electrophysiologist: None  Referring Physician: Dr Barkley Boards is a 57 y.o. male with a history of DM, alcohol abuse, HLD, tobacco use, CAD, complex h/o abdominal surgeries, OSA, atrial flutter, atrial fibrillation who presents for follow up in the [facility]. He presented to the hospital 03/23/23 with nausea and vomiting. He had been drinking approximately 2 drinks a day up until he stopped completely a few days before admission. He then developed issues with appetite, n/v, and abdominal discomfort. He has chronic abdominal pain at baseline. He was initially in sinus tach then went into atrial flutter with RVR. He was treated with diltiazem drip and converted to NSR. He was started on Toprol 50mg . However, he subsequently has reverted intermittently back to atrial fib + atrial flutter so this was increased to 100mg  daily.  Patient is on Eliquis for a CHADS2VASC score of 2.  On follow up today, patient is in SR. He does still have fatigue at times. He does have a diagnosis of OSA but his CPAP machine is broken so he has not used it in quite some time. No bleeding issues on anticoagulation. He reports that he drinks one alcoholic drink daily.  Today, he denies symptoms of palpitations, chest pain, shortness of breath, orthopnea, PND, lower extremity edema, dizziness, presyncope, syncope, snoring, daytime somnolence, bleeding, or neurologic sequela. The patient is tolerating medications without difficulties and is otherwise without complaint today.    Atrial Fibrillation Risk Factors:  he does have symptoms or diagnosis of sleep apnea. he does not have a history of rheumatic fever. he does have a history of alcohol use. The patient does not have a history of early familial atrial fibrillation or other arrhythmias.  Atrial Fibrillation Management  history:  Previous antiarrhythmic drugs: none Previous cardioversions: none Previous ablations: none Anticoagulation history: Eliquis  ROS- All systems are reviewed and negative except as per the HPI above.  Past Medical History:  Diagnosis Date   Alcoholic (HCC)    CHF (congestive heart failure) (HCC)    Depression    Diabetes mellitus without complication (HCC)    metformin   Drug-seeking behavior    Dyspnea    with exertion .   Dysrhythmia    while hospitalized in [place]   Gastric rupture    History of kidney stones    Hypertension    MRSA (methicillin resistant Staphylococcus aureus)    Perforation bowel (HCC)    PNA (pneumonia)    Renal insufficiency    Respiratory failure (HCC)    Sleep apnea    no CPAP machine    Current Outpatient Medications  Medication Sig Dispense Refill   albuterol (VENTOLIN HFA) 108 (90 Base) MCG/ACT inhaler Inhale into the lungs.     apixaban (ELIQUIS) 5 MG TABS tablet Take 1 tablet (5 mg total) by mouth 2 (two) times daily. 60 tablet 0   cetirizine (ZYRTEC) 10 MG tablet Take 10 mg by mouth at bedtime.     cyclobenzaprine (FLEXERIL) 10 MG tablet Take 1 tablet (10 mg total) by mouth at bedtime. 30 tablet 5   fluticasone (FLONASE) 50 MCG/ACT nasal spray Place 2 sprays into both nostrils daily.     gabapentin (NEURONTIN) 300 MG capsule Take 1 capsule (300 mg total) by mouth 3 (three) times daily. (Patient taking differently: Take 300 mg by mouth at bedtime as needed.) 90 capsule 1   metFORMIN (  GLUCOPHAGE) 500 MG tablet Take 500 mg by mouth daily.     metoprolol succinate (TOPROL-XL) 100 MG 24 hr tablet Take 1 tablet (100 mg total) by mouth daily. Take with or immediately following a meal. 30 tablet 0   mirtazapine (REMERON) 15 MG tablet Take 1 tablet (15 mg total) by mouth at bedtime. 30 tablet 2   ONETOUCH VERIO test strip daily.     pantoprazole (PROTONIX) 40 MG tablet Take 1 tablet (40 mg total) by mouth daily. 90 tablet 3   risperiDONE  (RISPERDAL) 1 MG tablet Take one tab at bed time. (Patient taking differently: Take 1 mg by mouth at bedtime. Take one tab at bed time.) 30 tablet 2   Vitamin D, Ergocalciferol, (DRISDOL) 1.25 MG (50000 UNIT) CAPS capsule Take 50,000 Units by mouth 2 (two) times a week.     No current facility-administered medications for this encounter.    Physical Exam: BP 104/84   Pulse 94   Ht 5\' 8"  (1.727 m)   Wt 84 kg   BMI 28.16 kg/m   GEN: Well nourished, well developed in no acute distress NECK: No JVD; No carotid bruits CARDIAC: Regular rate and rhythm, no murmurs, rubs, gallops RESPIRATORY:  Clear to auscultation without rales, wheezing or rhonchi  ABDOMEN: Soft, non-tender, non-distended EXTREMITIES:  No edema; No deformity   Wt Readings from Last 3 Encounters:  04/09/23 84 kg  04/01/23 83 kg  03/23/23 83 kg     EKG today demonstrates  SR Vent. rate 94 BPM PR interval 146 ms QRS duration 82 ms QT/QTcB 338/422 ms   Echo 03/24/23 demonstrated   1. Left ventricular ejection fraction, by estimation, is 50 to 55%. The  left ventricle has low normal function. The left ventricle has no regional  wall motion abnormalities. Left ventricular diastolic parameters are  consistent with Grade I diastolic dysfunction (impaired relaxation).   2. Right ventricular systolic function is normal. The right ventricular  size is normal. There is normal pulmonary artery systolic pressure. The  estimated right ventricular systolic pressure is 29.5 mmHg.   3. The mitral valve is grossly normal. Trivial mitral valve  regurgitation. No evidence of mitral stenosis.   4. The aortic valve is tricuspid. Aortic valve regurgitation is not  visualized. No aortic stenosis is present.   5. The inferior vena cava is dilated in size with >50% respiratory  variability, suggesting right atrial pressure of 8 mmHg.    CHA2DS2-VASc Score = 2  The patient's score is based upon: CHF History: 0 HTN History:  0 Diabetes History: 1 Stroke History: 0 Vascular Disease History: 1 Age Score: 0 Gender Score: 0       ASSESSMENT AND PLAN: Paroxysmal Atrial Fibrillation/atrial flutter The patient's CHA2DS2-VASc score is 2, indicating a 2.2% annual risk of stroke.   General education about afib provided and questions answered. We also discussed his stroke risk and the risks and benefits of anticoagulation. He is in SR today, unclear burden given he was cardiac unaware while in the hospital. Will have him wear a 2 week Zio monitor to assess arrhythmia burden. Suspect untreated OSA and alcohol intake are contributing to afib. Continue Eliquis 5 mg BID Continue Toprol 100 mg daily  Secondary Hypercoagulable State (ICD10:  D68.69) The patient is at significant risk for stroke/thromboembolism based upon his CHA2DS2-VASc Score of 2.  Continue Apixaban (Eliquis).   HTN Stable on current regimen  OSA  Encouraged nightly CPAP The importance of adequate treatment of sleep  apnea was discussed today in order to improve our ability to maintain sinus rhythm long term. Will refer him to reestablish care with Dr Vassie Loll who followed him remotely.    Follow up with Dr Cristal Deer or APP in 3 months. Follow up in AF clinic pending monitor results.        Jorja Loa PA-C Afib Clinic [facility] [address] [place], [place] [address] [phone]

## 2023-04-30 ENCOUNTER — Telehealth (HOSPITAL_COMMUNITY): Payer: Self-pay

## 2023-04-30 NOTE — Telephone Encounter (Signed)
Thanks for the update.  I hope they referred him for rehab program.
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
## 2023-05-10 ENCOUNTER — Encounter (HOSPITAL_COMMUNITY): Payer: Self-pay | Admitting: *Deleted

## 2023-05-15 ENCOUNTER — Encounter (HOSPITAL_COMMUNITY): Payer: Self-pay | Admitting: Physician Assistant

## 2023-05-15 ENCOUNTER — Ambulatory Visit (HOSPITAL_COMMUNITY)
Admission: RE | Admit: 2023-05-15 | Discharge: 2023-05-15 | Disposition: A | Discharge status: Home / Self Care | Payer: PPO | Source: Ambulatory Visit | Attending: Physician Assistant | Admitting: Physician Assistant

## 2023-05-15 VITALS — BP 108/88 | HR 92 | Ht 68.0 in | Wt 184.0 lb

## 2023-05-15 DIAGNOSIS — I110 Hypertensive heart disease with heart failure: Secondary | ICD-10-CM | POA: Diagnosis not present

## 2023-05-15 DIAGNOSIS — D6869 Other thrombophilia: Secondary | ICD-10-CM | POA: Insufficient documentation

## 2023-05-15 DIAGNOSIS — I4892 Unspecified atrial flutter: Secondary | ICD-10-CM | POA: Insufficient documentation

## 2023-05-15 DIAGNOSIS — S0990XA Unspecified injury of head, initial encounter: Secondary | ICD-10-CM | POA: Insufficient documentation

## 2023-05-15 DIAGNOSIS — Z7901 Long term (current) use of anticoagulants: Secondary | ICD-10-CM | POA: Insufficient documentation

## 2023-05-15 DIAGNOSIS — I480 Paroxysmal atrial fibrillation: Secondary | ICD-10-CM | POA: Diagnosis present

## 2023-05-15 DIAGNOSIS — X58XXXA Exposure to other specified factors, initial encounter: Secondary | ICD-10-CM | POA: Diagnosis not present

## 2023-05-15 DIAGNOSIS — G4733 Obstructive sleep apnea (adult) (pediatric): Secondary | ICD-10-CM | POA: Insufficient documentation

## 2023-05-15 NOTE — Progress Notes (Signed)
Primary Care Physician: [facility] Primary Cardiologist: Jodelle Red, MD Electrophysiologist: None  Referring Physician: Dr Barkley Boards is a 57 y.o. male with a history of DM, alcohol abuse, HLD, tobacco use, CAD, complex h/o abdominal surgeries, OSA, atrial flutter, atrial fibrillation who presents for follow up in the [facility]. He presented to the hospital 03/23/23 with nausea and vomiting. He had been drinking approximately 2 drinks a day up until he stopped completely a few days before admission. He then developed issues with appetite, n/v, and abdominal discomfort. He has chronic abdominal pain at baseline. He was initially in sinus tach then went into atrial flutter with RVR. He was treated with diltiazem drip and converted to NSR. He was started on Toprol 50mg . However, he subsequently has reverted intermittently back to atrial fib + atrial flutter so this was increased to 100mg  daily.  Patient is on Eliquis for a CHADS2VASC score of 2.  On follow up today, patient wore a cardiac monitor which showed a 9% afib burden. He was unaware of his arrhythmia but does have chronic fatigue. He is in SR today. Patient reports continued alcohol use. He did trip over a piece of furniture last night and hit the top of his head. There is a small cut and a bruise today. He did not go to the ED. He denies any neurologic symptoms.   Today, he denies symptoms of palpitations, chest pain, shortness of breath, orthopnea, PND, lower extremity edema, dizziness, presyncope, syncope, snoring, daytime somnolence, bleeding, or neurologic sequela. The patient is tolerating medications without difficulties and is otherwise without complaint today.    Atrial Fibrillation Risk Factors:  he does have symptoms or diagnosis of sleep apnea. he does not have a history of rheumatic fever. he does have a history of alcohol use. The patient does not  have a history of early familial atrial fibrillation or other arrhythmias.  Atrial Fibrillation Management history:  Previous antiarrhythmic drugs: none Previous cardioversions: none Previous ablations: none Anticoagulation history: Eliquis  ROS- All systems are reviewed and negative except as per the HPI above.  Past Medical History:  Diagnosis Date   Alcoholic (HCC)    CHF (congestive heart failure) (HCC)    Depression    Diabetes mellitus without complication (HCC)    metformin   Drug-seeking behavior    Dyspnea    with exertion .   Dysrhythmia    while hospitalized in [place]   Gastric rupture    History of kidney stones    Hypertension    MRSA (methicillin resistant Staphylococcus aureus)    Perforation bowel (HCC)    PNA (pneumonia)    Renal insufficiency    Respiratory failure (HCC)    Sleep apnea    no CPAP machine    Current Outpatient Medications  Medication Sig Dispense Refill   albuterol (VENTOLIN HFA) 108 (90 Base) MCG/ACT inhaler Inhale into the lungs.     apixaban (ELIQUIS) 5 MG TABS tablet Take 1 tablet (5 mg total) by mouth 2 (two) times daily. 60 tablet 4   cetirizine (ZYRTEC) 10 MG tablet Take 10 mg by mouth at bedtime.     cyclobenzaprine (FLEXERIL) 10 MG tablet Take 1 tablet (10 mg total) by mouth at bedtime. 30 tablet 5   fluticasone (FLONASE) 50 MCG/ACT nasal spray Place 2 sprays into both nostrils daily.     gabapentin (NEURONTIN) 300 MG capsule Take 1 capsule (300 mg total) by  mouth 3 (three) times daily. (Patient taking differently: Take 300 mg by mouth at bedtime as needed.) 90 capsule 1   metFORMIN (GLUCOPHAGE) 500 MG tablet Take 500 mg by mouth daily.     metoprolol succinate (TOPROL-XL) 100 MG 24 hr tablet Take 1 tablet (100 mg total) by mouth daily. Take with or immediately following a meal. 30 tablet 4   ONETOUCH VERIO test strip daily.     pantoprazole (PROTONIX) 40 MG tablet Take 1 tablet (40 mg total) by mouth daily. 90 tablet 3    risperiDONE (RISPERDAL) 1 MG tablet Take one tab at bed time. (Patient taking differently: Take 1 mg by mouth at bedtime. Take one tab at bed time.) 30 tablet 2   Vitamin D, Ergocalciferol, (DRISDOL) 1.25 MG (50000 UNIT) CAPS capsule Take 50,000 Units by mouth 2 (two) times a week.     mirtazapine (REMERON) 15 MG tablet Take 1 tablet (15 mg total) by mouth at bedtime. 30 tablet 2   No current facility-administered medications for this encounter.    Physical Exam: BP 108/88   Pulse 92   Ht 5\' 8"  (1.727 m)   Wt 83.5 kg   BMI 27.98 kg/m   GEN: Well nourished, well developed in no acute distress HEAD: small 1" laceration and ecchymosis on top of head NECK: No JVD; No carotid bruits CARDIAC: Regular rate and rhythm, no murmurs, rubs, gallops RESPIRATORY:  Clear to auscultation without rales, wheezing or rhonchi  ABDOMEN: Soft, non-tender, non-distended EXTREMITIES:  No edema; No deformity    Wt Readings from Last 3 Encounters:  05/15/23 83.5 kg  04/09/23 84 kg  04/01/23 83 kg     EKG today demonstrates  SR Vent. rate 92 BPM PR interval 150 ms QRS duration 88 ms QT/QTcB 352/435 ms   Echo 03/24/23 demonstrated   1. Left ventricular ejection fraction, by estimation, is 50 to 55%. The  left ventricle has low normal function. The left ventricle has no regional  wall motion abnormalities. Left ventricular diastolic parameters are  consistent with Grade I diastolic dysfunction (impaired relaxation).   2. Right ventricular systolic function is normal. The right ventricular  size is normal. There is normal pulmonary artery systolic pressure. The  estimated right ventricular systolic pressure is 29.5 mmHg.   3. The mitral valve is grossly normal. Trivial mitral valve  regurgitation. No evidence of mitral stenosis.   4. The aortic valve is tricuspid. Aortic valve regurgitation is not  visualized. No aortic stenosis is present.   5. The inferior vena cava is dilated in size with >50%  respiratory  variability, suggesting right atrial pressure of 8 mmHg.    CHA2DS2-VASc Score = 2  The patient's score is based upon: CHF History: 0 HTN History: 0 Diabetes History: 1 Stroke History: 0 Vascular Disease History: 1 Age Score: 0 Gender Score: 0       ASSESSMENT AND PLAN: Paroxysmal Atrial Fibrillation/atrial flutter The patient's CHA2DS2-VASc score is 2, indicating a 2.2% annual risk of stroke.   Zio monitor showed 9% afib burden with elevated V rates Suspect untreated OSA and alcohol abuse are contributing to afib. We discussed medications options. He is concerned about both cost and side effects of new medications. Will focus on lifestyle modification for now. He is likely not an ablation candidate with ongoing alcohol use.  Continue Eliquis 5 mg BID Continue Toprol 100 mg daily  Secondary Hypercoagulable State (ICD10:  D68.69) The patient is at significant risk for stroke/thromboembolism based upon  his CHA2DS2-VASc Score of 2.  Continue Apixaban (Eliquis).   HTN Stable on current regimen  OSA  Encouraged nightly CPAP Referred to reestablish care with Dr Vassie Loll who followed him remotely.   Head trauma Patient fell last night and hit his head. He has a small 1" cut and bruising on the top of his head. He denies any neurologic symptoms. He declined both imaging in ED and CT as an outpatient. Discussed importance of evaluation since he is on Eliquis. ED precautions reviewed.    Follow up with Dr Cristal Deer as scheduled. AF clinic in 6 months.       Jorja Loa PA-C Afib Clinic [facility] [address] [place], [place] [address] [phone]

## 2023-05-16 ENCOUNTER — Ambulatory Visit (HOSPITAL_COMMUNITY): Payer: PPO | Admitting: Psychiatry

## 2023-05-16 ENCOUNTER — Encounter (HOSPITAL_COMMUNITY): Payer: Self-pay | Admitting: Psychiatry

## 2023-05-16 VITALS — BP 150/94 | HR 102 | Resp 16 | Ht 68.0 in | Wt 185.6 lb

## 2023-05-16 DIAGNOSIS — F1210 Cannabis abuse, uncomplicated: Secondary | ICD-10-CM

## 2023-05-16 DIAGNOSIS — F3131 Bipolar disorder, current episode depressed, mild: Secondary | ICD-10-CM

## 2023-05-16 DIAGNOSIS — F419 Anxiety disorder, unspecified: Secondary | ICD-10-CM

## 2023-05-16 DIAGNOSIS — F1010 Alcohol abuse, uncomplicated: Secondary | ICD-10-CM | POA: Diagnosis not present

## 2023-05-16 MED ORDER — MIRTAZAPINE 30 MG PO TABS: 30.0000 mg | ORAL_TABLET | Freq: Every day | ORAL | 2 refills | Status: DC

## 2023-05-16 MED ORDER — RISPERIDONE 1 MG PO TABS: ORAL_TABLET | ORAL | 2 refills | Status: DC
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
## 2023-05-26 ENCOUNTER — Emergency Department (HOSPITAL_COMMUNITY)
Admission: EM | Admit: 2023-05-26 | Discharge: 2023-05-26 | Discharge status: Against Medical Advice | Payer: PPO | Attending: Emergency Medicine | Admitting: Emergency Medicine

## 2023-05-26 ENCOUNTER — Other Ambulatory Visit: Payer: Self-pay

## 2023-05-26 ENCOUNTER — Encounter (HOSPITAL_COMMUNITY): Payer: Self-pay

## 2023-05-26 DIAGNOSIS — Y909 Presence of alcohol in blood, level not specified: Secondary | ICD-10-CM | POA: Diagnosis not present

## 2023-05-26 DIAGNOSIS — F10120 Alcohol abuse with intoxication, uncomplicated: Secondary | ICD-10-CM | POA: Diagnosis present

## 2023-05-26 DIAGNOSIS — Z5321 Procedure and treatment not carried out due to patient leaving prior to being seen by health care provider: Secondary | ICD-10-CM | POA: Diagnosis not present

## 2023-05-26 NOTE — ED Notes (Signed)
Pt eloped from [facility] and left. Dr Tegeler aware pt left facility.
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
## 2023-07-03 ENCOUNTER — Ambulatory Visit (HOSPITAL_BASED_OUTPATIENT_CLINIC_OR_DEPARTMENT_OTHER): Payer: PPO | Admitting: Cardiology

## 2023-08-14 ENCOUNTER — Telehealth (HOSPITAL_BASED_OUTPATIENT_CLINIC_OR_DEPARTMENT_OTHER): Payer: Self-pay | Admitting: Psychiatry

## 2023-08-14 DIAGNOSIS — Z91199 Patient's noncompliance with other medical treatment and regimen due to unspecified reason: Secondary | ICD-10-CM

## 2023-08-14 NOTE — Progress Notes (Signed)
 Patient is no-show on the video platform.  I called [phone] twice and left a message to call us back.

## 2023-08-23 ENCOUNTER — Encounter (HOSPITAL_COMMUNITY): Payer: Self-pay | Admitting: Psychiatry

## 2023-08-23 ENCOUNTER — Telehealth (HOSPITAL_COMMUNITY): Payer: Self-pay | Admitting: Licensed Clinical Social Worker

## 2023-08-23 ENCOUNTER — Telehealth (HOSPITAL_COMMUNITY): Admitting: Psychiatry

## 2023-08-23 VITALS — Wt 200.0 lb

## 2023-08-23 DIAGNOSIS — F1210 Cannabis abuse, uncomplicated: Secondary | ICD-10-CM

## 2023-08-23 DIAGNOSIS — F1010 Alcohol abuse, uncomplicated: Secondary | ICD-10-CM | POA: Diagnosis not present

## 2023-08-23 DIAGNOSIS — F3131 Bipolar disorder, current episode depressed, mild: Secondary | ICD-10-CM

## 2023-08-23 DIAGNOSIS — F1994 Other psychoactive substance use, unspecified with psychoactive substance-induced mood disorder: Secondary | ICD-10-CM

## 2023-08-23 DIAGNOSIS — F419 Anxiety disorder, unspecified: Secondary | ICD-10-CM | POA: Diagnosis not present

## 2023-08-23 MED ORDER — MIRTAZAPINE 30 MG PO TABS: 30.0000 mg | ORAL_TABLET | Freq: Every day | ORAL | 0 refills | Status: DC

## 2023-08-23 MED ORDER — RISPERIDONE 2 MG PO TABS: ORAL_TABLET | ORAL | 0 refills | Status: DC

## 2023-08-23 NOTE — Telephone Encounter (Signed)
 The therapist attempts to reach Lucas Small leaving a HIPAA-compliant message with a woman who answers the phone stating that he is not available as he is lying down due to not feeling well.   Myrna Blazer, MA, LCSW, [facility], LCAS 08/23/2023
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
## 2023-08-28 ENCOUNTER — Telehealth (HOSPITAL_COMMUNITY): Payer: Self-pay

## 2023-08-28 NOTE — Telephone Encounter (Signed)
 This Therapist is referred by his psychiatrist for CD-IOP.   Therapist calls Lucas Small and he answers. Therapist verifies she is speaking to the correct person by obtaining two identifiers.   Therapist explains she is following up as he was not feeling well when Bill called him.   He says he is feeling better.  Therapist discusses with him about his drinking. Delmon says he has not had any alcohol in 2 days and he not having any withdrawal symptoms.  Therapist inquired if he had ever had any withdrawal seizures. He says he has not. Therapist discussed the time frame for withdrawal symptoms for alcohol and advised him of where he could go for detox. Weylyn says he is detoxing with marijuana as it is legal and he buys it from the store.  He asks when he can begin CD-IOP. Therapist explains [facility]'s policy and explains it is abstinence based. Czar says he does not understand this as cannabis is now legal. Therapist explains the levels of THC that people can obtain legally now and noted that because a substance is legal does not make it acceptable to use while in CDIOP. Therapist explains he can receive individual therapy and this is what he chooses. Because he has Media planner, this therapist will send an in basket message asking Myrna Blazer, LCSW, [facility], LCAS to call and schedule him,  Remigio Eisenmenger, MS, LMFT, LCAS 08-28-23

## 2023-09-05 ENCOUNTER — Institutional Professional Consult (permissible substitution) (HOSPITAL_BASED_OUTPATIENT_CLINIC_OR_DEPARTMENT_OTHER): Payer: PPO | Admitting: Pulmonary Disease

## 2023-09-10 ENCOUNTER — Ambulatory Visit (HOSPITAL_COMMUNITY): Admitting: Licensed Clinical Social Worker

## 2023-09-10 DIAGNOSIS — F1290 Cannabis use, unspecified, uncomplicated: Secondary | ICD-10-CM

## 2023-09-10 DIAGNOSIS — F39 Unspecified mood [affective] disorder: Secondary | ICD-10-CM

## 2023-09-10 DIAGNOSIS — F1020 Alcohol dependence, uncomplicated: Secondary | ICD-10-CM
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
## 2023-09-11 ENCOUNTER — Encounter (HOSPITAL_COMMUNITY): Payer: Self-pay

## 2023-09-17 ENCOUNTER — Encounter (HOSPITAL_COMMUNITY): Payer: Self-pay | Admitting: Psychiatry

## 2023-09-17 ENCOUNTER — Telehealth (HOSPITAL_BASED_OUTPATIENT_CLINIC_OR_DEPARTMENT_OTHER): Admitting: Psychiatry

## 2023-09-17 VITALS — Wt 205.0 lb

## 2023-09-17 DIAGNOSIS — F419 Anxiety disorder, unspecified: Secondary | ICD-10-CM | POA: Diagnosis not present

## 2023-09-17 DIAGNOSIS — F1010 Alcohol abuse, uncomplicated: Secondary | ICD-10-CM | POA: Diagnosis not present

## 2023-09-17 DIAGNOSIS — F1210 Cannabis abuse, uncomplicated: Secondary | ICD-10-CM | POA: Diagnosis not present

## 2023-09-17 DIAGNOSIS — F3131 Bipolar disorder, current episode depressed, mild: Secondary | ICD-10-CM

## 2023-09-17 MED ORDER — RISPERIDONE 2 MG PO TABS: ORAL_TABLET | ORAL | 1 refills | Status: DC

## 2023-09-17 MED ORDER — BACLOFEN 10 MG PO TABS: 10.0000 mg | ORAL_TABLET | Freq: Three times a day (TID) | ORAL | 1 refills | Status: DC

## 2023-09-17 MED ORDER — MIRTAZAPINE 30 MG PO TABS: 30.0000 mg | ORAL_TABLET | Freq: Every day | ORAL | 1 refills | Status: DC
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
## 2023-09-24 ENCOUNTER — Ambulatory Visit (HOSPITAL_COMMUNITY): Admitting: Licensed Clinical Social Worker

## 2023-09-24 DIAGNOSIS — F1210 Cannabis abuse, uncomplicated: Secondary | ICD-10-CM

## 2023-09-24 DIAGNOSIS — F1020 Alcohol dependence, uncomplicated: Secondary | ICD-10-CM

## 2023-09-24 DIAGNOSIS — F3131 Bipolar disorder, current episode depressed, mild: Secondary | ICD-10-CM

## 2023-09-24 NOTE — Progress Notes (Signed)
 THERAPIST PROGRESS NOTE  Session Time: 10:05 a.m. to 10:45 a.m.   Type of Therapy: Individual   Therapist Response/Interventions: Solution Focused/The therapist talks to Lucas Small about attending [facility] and the importance of scheduling in early recovery. The therapist emails him a link to [facility] meets adding that recovery from addiction takes place in fellowship.   Treatment Goals addressed:  Active     Substance Use     Lucas Small will abstain completely from alcohol  until at least 05/27/2024. (Initial)     Start:  09/11/23    Expected End:  03/12/24         The therapist will assist Lucas Small in changing his belief system regarding what constitutes an alcoholic or an addict while assist him in identifying and not engaging in his using rituals via avoiding triggers for using.      Start:  09/11/23            Summary: Lucas Small presents saying that he is having some anxiety which he attributes to alcohol  withdrawal saying that he stopped drinking on April 4th. He also admits to some mild depression being unable to drink. He is taking Buspar for anxiety and just started Baclofen  this morning for cravings. He admits to having some cravings saying that he is trying to deal with them by keeping his mind occupied. He continues to say that his goal is to not drink through the end of the year admitting that  the 4th of July will be a potential trigger. He concludes that it would probably be a good idea to not go downtown on the 4th with friends as there will be drinking there.  He says that he is occupying his time studying [place] with his girlfriend for about 25 minutes per day. When asked what he is doing with the remainder of his time, he says that he watches movies on Netflix. He is continuing to use the THC gummies.  Lucas Small admits to being very tired today as his girlfriend had a procedure on her esophagus so he was up all night getting only about 2 hours of sleep. He says that his  main goal now is to get another endoscopy and will see his PCP the Monday after next to discuss this.   He believes he has attended a few [facility] meetings in the past adding that he would prefer to attend in person but is eventually open to doing some virtual meetings given the lack of local meetings. He says that his girlfriend can attend them with him. He asks to end the session early as he is too tired to thing and wants to go home to take a nap.  Progress Towards Goals: Progressing  Suicidal/Homicidal: No SI or HI  Plan: Return again in 2 weeks.  Diagnosis: Alcohol  Use Disorder, Severe; Mild THC Abuse; and Bipolar affective disorder, currently depressed, mild  Collaboration of Care: Other N/A  Patient/Guardian was advised Release of Information must be obtained prior to any record release in order to collaborate their care with an outside provider. Patient/Guardian was advised if they have not already done so to contact the registration department to sign all necessary forms in order for us  to release information regarding their care.   Consent: Patient/Guardian gives verbal consent for treatment and assignment of benefits for services provided during this visit. Patient/Guardian expressed understanding and agreed to proceed.   Melynda Stagger, MA, LCSW, [facility], LCAS 09/24/2023

## 2023-10-04 ENCOUNTER — Ambulatory Visit (HOSPITAL_BASED_OUTPATIENT_CLINIC_OR_DEPARTMENT_OTHER): Admitting: Adult Health

## 2023-10-10 ENCOUNTER — Ambulatory Visit (INDEPENDENT_AMBULATORY_CARE_PROVIDER_SITE_OTHER): Admitting: Licensed Clinical Social Worker

## 2023-10-10 DIAGNOSIS — F1210 Cannabis abuse, uncomplicated: Secondary | ICD-10-CM

## 2023-10-10 DIAGNOSIS — F1020 Alcohol dependence, uncomplicated: Secondary | ICD-10-CM | POA: Diagnosis not present

## 2023-10-10 DIAGNOSIS — F3131 Bipolar disorder, current episode depressed, mild: Secondary | ICD-10-CM

## 2023-10-10 NOTE — Progress Notes (Signed)
 THERAPIST PROGRESS NOTE  Session Time: 10:03 a.m. to  10:54 a.m.   Type of Therapy: Individual   Therapist Response/Interventions: Solution Focused and CBT/The therapist challenges the belief that Lucas Small or anyone must drink on his or her birthday or on the 4th of July pointing out that many people celebrate these events without alcohol . The therapist encourages him to consider the pros and cons of drinking and shows him how to access the [facility] meetings while suggesting he plan what he will do on his birthday and the 4th in advance.   Treatment Goals addressed:  Active     Substance Use     Lucas Small will abstain completely from alcohol  until at least 05/27/2024. (Progressing)     Start:  09/11/23    Expected End:  03/12/24         The therapist will assist Lucas Small in changing his belief system regarding what constitutes an alcoholic or an addict while assist him in identifying and not engaging in his using rituals via avoiding triggers for using.      Start:  09/11/23               Summary: Lucas Small presents saying that he is walking about 2 miles per day now with his girlfriend. He says that he has not had anything to drink since April 4th. He says that the Baclofen  "works slightly." He says that the anxiety is "getting easier to handle" noting that he was "constantly stressed out." He continues to take Buspar but only has six pills left. He will check to see if he has a refill.  The major focus of today's session involves how Lucas Small will get through his birthday and July 4th without drinking. Additionally, the therapist spends time showing Lucas Small how to change the search parameter for the [facility] site with [employer] indicating that he now has some meetings to try.   Progress Towards Goals: Progressing  Suicidal/Homicidal: No SI or HI  Plan: Return again in 4 weeks.  Diagnosis: Alcohol  Use Disorder, Severe; Mild THC Abuse; and Bipolar affective disorder, currently  depressed, mild  Collaboration of Care: Other N/A  Patient/Guardian was advised Release of Information must be obtained prior to any record release in order to collaborate their care with an outside provider. Patient/Guardian was advised if they have not already done so to contact the registration department to sign all necessary forms in order for us  to release information regarding their care.   Consent: Patient/Guardian gives verbal consent for treatment and assignment of benefits for services provided during this visit. Patient/Guardian expressed understanding and agreed to proceed.   Melynda Stagger, MA, LCSW, [facility], LCAS 10/10/2023

## 2023-11-07 ENCOUNTER — Ambulatory Visit (HOSPITAL_COMMUNITY): Admitting: Licensed Clinical Social Worker

## 2023-11-07 DIAGNOSIS — F1210 Cannabis abuse, uncomplicated: Secondary | ICD-10-CM

## 2023-11-07 DIAGNOSIS — F1020 Alcohol dependence, uncomplicated: Secondary | ICD-10-CM | POA: Diagnosis not present

## 2023-11-07 DIAGNOSIS — F419 Anxiety disorder, unspecified: Secondary | ICD-10-CM

## 2023-11-07 DIAGNOSIS — F3131 Bipolar disorder, current episode depressed, mild: Secondary | ICD-10-CM

## 2023-11-07 NOTE — Progress Notes (Addendum)
 THERAPIST PROGRESS NOTE  Session Time: 10:03 a.m. to  .10:49 a.m.    Type of Therapy: Individual   Therapist Response/Interventions: Solution Focused and CBT/The therapist addresses Pacen's statements suggesting that Smart [facility] is essentially AA explaining the differences between the programs and noting that Gray could attend meetings without his girlfriend also having to attend if he chose to do so.  The therapist explains that MAT is not intended as a standalone cure but one more tool in his toolbox in terms of staying sober. The therapist responds to his comment about the legality of marijuana observing that alcohol  and other controlled substances are legal but can are addictive and cause serious consequences up to death.   The therapist encourages him to keep exercising and recommends that he reduce his milk consumption and substitute something without calories such as a flavored sparkling water so as to keep his weight down.   Treatment Goals addressed:  Active     Substance Use     Langley will abstain completely from alcohol  until at least 05/27/2024. (Progressing)     Start:  09/11/23    Expected End:  03/12/24         The therapist will assist Narayan in changing his belief system regarding what constitutes an alcoholic or an addict while assist him in identifying and not engaging in his using rituals via avoiding triggers for using.      Start:  09/11/23                  Summary: [place] presents saying that he and his girlfriend talked about it and decided they are not going to attend [facility].   He says that he is drinking about a gallon of milk per day needing something to sip on saying that he did not have a big belly and was not gaining all this weight when drinking.   Eligha continues to take the baclofen  daily saying that it is helping some but sounding under whelmed about it. The therapist talks to Izaiha about other options for MAT such as  naltrexone.   He says that he continues to walk and continues to use THC gummies. He says that his partner is a support dismissing her regular cannabis use commenting on how it is legal.   He reports that his mood is good and that his first big trigger is coming in the form of his birthday on [birth year]. He and his girlfriend are going to [place]  to see one of the [place] the [place] and eat at [employer] of a famous Investment banker, operational.     Progress Towards Goals: Progressing  Suicidal/Homicidal: No SI or HI  Plan: Return again in 4 weeks.  Diagnosis: Alcohol  Use Disorder, Severe; Mild THC Abuse; and Bipolar affective disorder, currently depressed, mild  Collaboration of Care: Other N/A  Patient/Guardian was advised Release of Information must be obtained prior to any record release in order to collaborate their care with an outside provider. Patient/Guardian was advised if they have not already done so to contact the registration department to sign all necessary forms in order for us  to release information regarding their care.   Consent: Patient/Guardian gives verbal consent for treatment and assignment of benefits for services provided during this visit. Patient/Guardian expressed understanding and agreed to proceed.   Zell Maier, MA, LCSW, [facility], LCAS 11/07/2023

## 2023-11-12 ENCOUNTER — Telehealth (HOSPITAL_COMMUNITY): Admitting: Psychiatry

## 2023-11-14 ENCOUNTER — Encounter (HOSPITAL_BASED_OUTPATIENT_CLINIC_OR_DEPARTMENT_OTHER): Payer: Self-pay | Admitting: Adult Health

## 2023-11-14 ENCOUNTER — Ambulatory Visit (HOSPITAL_BASED_OUTPATIENT_CLINIC_OR_DEPARTMENT_OTHER): Admitting: Adult Health

## 2023-11-14 VITALS — BP 113/96 | HR 46 | Ht 68.0 in | Wt 200.0 lb

## 2023-11-14 DIAGNOSIS — Z6830 Body mass index (BMI) 30.0-30.9, adult: Secondary | ICD-10-CM

## 2023-11-14 DIAGNOSIS — I480 Paroxysmal atrial fibrillation: Secondary | ICD-10-CM | POA: Diagnosis not present

## 2023-11-14 DIAGNOSIS — E669 Obesity, unspecified: Secondary | ICD-10-CM | POA: Diagnosis not present

## 2023-11-14 DIAGNOSIS — R0683 Snoring: Secondary | ICD-10-CM | POA: Diagnosis not present

## 2023-11-14 DIAGNOSIS — G4733 Obstructive sleep apnea (adult) (pediatric): Secondary | ICD-10-CM | POA: Diagnosis not present

## 2023-11-14 NOTE — Progress Notes (Signed)
 @Patient  ID: Lucas Small, male    DOB: 06-17-1965, 58 y.o.   MRN: [id number]  Chief Complaint  Patient presents with   Establish Care    Obstructive sleep apnea    Referring provider: Twana Gal, PA  HPI: 58 year old male seen for sleep consult November 14, 2023 to reestablish for sleep apnea Former patient of Dr.deDios   TEST/EVENTS :  06/2015 [facility] AHI 34.6 /hr   11/14/2023 Consult  Discussed the use of AI scribe software for clinical note transcription with the patient, who gave verbal consent to proceed.  History of Present Illness   Lucas Small is a 58 year old male with severe sleep apnea and atrial fibrillation who presents for re-evaluation of his sleep apnea .  He has a history of severe sleep apnea, previously managed with CPAP therapy. His CPAP machine broke two years ago, and he has not used it since. Prior to the breakdown, he used the CPAP every night and found it beneficial. He experiences symptoms of snoring, choking, and gasping for air during sleep. A previous sleep study showed 35 episodes of sleep apnea per hour.  He has a history of atrial fibrillation and is currently taking Eliquis . Followed by cardiology with recommendations to restablish for OSA.   His past medical history includes high blood pressure, congestive heart failure, diabetes, and bipolar disorder. He has a history of multiple abdominal surgeries due to diverticulitis, which led to septic shock and a prolonged coma. During this critical illness, he experienced multiple cardiac arrests and was intubated, later receiving a tracheostomy.  He quit smoking two years ago and stopped drinking alcohol  two months ago. He uses THC gummies and is currently disabled. He was born in [place] , spent his developmental years in [place], and returned to the [place]  in [place]. He resides in   and typically takes an afternoon nap for about an hour. He consumes caffeine in small amounts.  Epworth 5 out of 24.     Social history patient is divorced.  Lives with his significant other.  He is disabled.  He has adult children.  Quit smoking 2 years ago.  Quit drinking 2 months ago.  Uses THC products.    Past Surgical History:  Procedure Laterality Date   ABDOMINAL SURGERY     APPENDECTOMY     CARDIAC SURGERY     HERNIA REPAIR     ILEOSTOMY     PERIPHERALLY INSERTED CENTRAL CATHETER INSERTION      Allergies  Allergen Reactions   Bee Venom Anaphylaxis    Immunization History  Administered Date(s) Administered   Influenza, Seasonal, Injecte, Preservative Fre 03/25/2023   PNEUMOCOCCAL CONJUGATE-20 03/25/2023   PPD Test 08/28/2011   Pneumococcal Polysaccharide-23 02/13/2016   Tdap 07/22/2017    Past Medical History:  Diagnosis Date   Alcoholic (HCC)    CHF (congestive heart failure) (HCC)    Depression    Diabetes mellitus without complication (HCC)    metformin    Drug-seeking behavior    Dyspnea    with exertion .   Dysrhythmia    while hospitalized in [place]   Gastric rupture    History of kidney stones    Hypertension    MRSA (methicillin resistant Staphylococcus aureus)    Perforation bowel (HCC)    PNA (pneumonia)    Renal insufficiency    Respiratory failure (HCC)    Sleep apnea    no CPAP machine    Tobacco History: Social History  Tobacco Use  Smoking Status Former   Current packs/day: 0.00   Average packs/day: 1.5 packs/day for 35.0 years (52.5 ttl pk-yrs)   Types: Cigarettes, E-cigarettes   Start date: 52   Quit date: 2023   Years since quitting: 2.4  Smokeless Tobacco Never  Tobacco Comments   Currently vaping 04/09/23   Counseling given: Not Answered Tobacco comments: Currently vaping 04/09/23   Outpatient Medications Prior to Visit  Medication Sig Dispense Refill   albuterol  (VENTOLIN  HFA) 108 (90 Base) MCG/ACT inhaler Inhale into the lungs.     apixaban  (ELIQUIS ) 5 MG TABS tablet Take 1 tablet (5 mg total) by mouth  2 (two) times daily. 60 tablet 4   baclofen  (LIORESAL ) 10 MG tablet Take 1 tablet (10 mg total) by mouth 3 (three) times daily. 90 tablet 1   cetirizine (ZYRTEC) 10 MG tablet Take 10 mg by mouth at bedtime.     cyclobenzaprine  (FLEXERIL ) 10 MG tablet Take 1 tablet (10 mg total) by mouth at bedtime. 30 tablet 5   fluticasone (FLONASE) 50 MCG/ACT nasal spray Place 2 sprays into both nostrils daily.     gabapentin  (NEURONTIN ) 300 MG capsule Take 1 capsule (300 mg total) by mouth 3 (three) times daily. (Patient taking differently: Take 300 mg by mouth at bedtime as needed.) 90 capsule 1   metFORMIN  (GLUCOPHAGE ) 500 MG tablet Take 500 mg by mouth daily.     metoprolol  succinate (TOPROL -XL) 100 MG 24 hr tablet Take 1 tablet (100 mg total) by mouth daily. Take with or immediately following a meal. 30 tablet 4   mirtazapine  (REMERON ) 30 MG tablet Take 1 tablet (30 mg total) by mouth at bedtime. 30 tablet 1   ONETOUCH VERIO test strip daily.     pantoprazole  (PROTONIX ) 40 MG tablet Take 1 tablet (40 mg total) by mouth daily. 90 tablet 3   risperiDONE  (RISPERDAL ) 2 MG tablet Take one tab at bed time. 30 tablet 1   Vitamin D , Ergocalciferol , (DRISDOL) 1.25 MG (50000 UNIT) CAPS capsule Take 50,000 Units by mouth 2 (two) times a week.     No facility-administered medications prior to visit.     Review of Systems:   Constitutional:   No  weight loss, night sweats,  Fevers, chills, +fatigue, or  lassitude.  HEENT:   No headaches,  Difficulty swallowing,  Tooth/dental problems, or  Sore throat,                No sneezing, itching, ear ache, nasal congestion, post nasal drip,   CV:  No chest pain,  Orthopnea, PND, swelling in lower extremities, anasarca, dizziness, palpitations, syncope.   GI  No heartburn, indigestion, abdominal pain, nausea, vomiting, diarrhea, change in bowel habits, loss of appetite, bloody stools.   Resp: No shortness of breath with exertion or at rest.  No excess mucus, no  productive cough,  No non-productive cough,  No coughing up of blood.  No change in color of mucus.  No wheezing.  No chest wall deformity  Skin: no rash or lesions.  GU: no dysuria, change in color of urine, no urgency or frequency.  No flank pain, no hematuria   MS:  No joint pain or swelling.  No decreased range of motion.  No back pain.    Physical Exam  BP (!) 113/96   Pulse (!) 46   Ht 5' 8 (1.727 m)   Wt 200 lb (90.7 kg)   SpO2 98%   BMI 30.41 kg/m  GEN: A/Ox3; pleasant , NAD, well nourished    HEENT:  [place]/AT,  EACs-clear, TMs-wnl, NOSE-clear, THROAT-clear, no lesions, no postnasal drip or exudate noted. Poor dentition, Class 2-3 MP airway   NECK:  Supple w/ fair ROM; no JVD; normal carotid impulses w/o bruits; no thyromegaly or nodules palpated; no lymphadenopathy.    RESP  Clear  P & A; w/o, wheezes/ rales/ or rhonchi. no accessory muscle use, no dullness to percussion  CARD:  RRR, no m/r/g, no peripheral edema, pulses intact, no cyanosis or clubbing.  GI:   Soft & nt; nml bowel sounds; no organomegaly or masses detected.   Musco: Warm bil, no deformities or joint swelling noted.   Neuro: alert, no focal deficits noted.    Skin: Warm, no lesions or rashes    Lab Results:  CBC   BNP No results found for: BNP    Imaging: No results found.  Administration History     None           No data to display          No results found for: NITRICOXIDE      Assessment & Plan:   Assessment and Plan    Severe Sleep Apnea   Severe sleep apnea History with previous sleep study AHI 35/hr. Previous  CPAP therapy was effective but discontinued for two years due to equipment failure. Symptoms include snoring, choking, and gasping during sleep. There is a potential link between sleep apnea and exacerbation of atrial fibrillation and congestive heart failure due to hypoxemia during apneic episodes. A split night sleep study is planned to assess  current severity and determine CPAP pressure settings.  Reinitiate CPAP therapy based on sleep study results. - discussed how weight can impact sleep and risk for sleep disordered breathing - discussed options to assist with weight loss: combination of diet modification, cardiovascular and strength training exercises   - had an extensive discussion regarding the adverse health consequences related to untreated sleep disordered breathing - specifically discussed the risks for hypertension, coronary artery disease, cardiac dysrhythmias, cerebrovascular disease, and diabetes - lifestyle modification discussed   - discussed how sleep disruption can increase risk of accidents, particularly when driving - safe driving practices were discussed    Atrial Fibrillation   Continue follow up with cardiology   Congestive Heart Failure   Continue follow up with Cards   History of Diverticulitis with Complications   He has a history of diverticulitis with severe complications, including sepsis and multiple surgeries. Prolonged critical illness with multiple cardiac arrests and tracheostomy   Morbid Obesity  Healthy weight loss   Plan  Patient Instructions  Set up for Split night sleep study  Do not drive if sleepy Work on healthy weight loss Follow Dr. Villa Greaser or Kincade Granberg NP in 6 weeks and As needed           Roena Clark, NP 11/14/2023

## 2023-11-14 NOTE — Patient Instructions (Signed)
 Set up for Split night sleep study  Do not drive if sleepy Work on healthy weight loss Follow Dr. Villa Greaser or Anaily Ashbaugh NP in 6 weeks and As needed

## 2023-11-17 NOTE — Progress Notes (Unsigned)
 Cardiology Office Note   Date:  11/18/2023  ID:  Lucas Small, DOB 1965-07-01, MRN [id number] PCP: Leron Millman, NP  [facility] Providers Cardiologist:  Shelda Bruckner, MD     PMH Tobacco use CAD OSA Atrial flutter on chronic anticoagulation Diabetes Alcohol  abuse Hypertension Chronic abdominal pain  Followed by [facility] clinic and last seen 05/15/23 by Daril Kicks, PA. Admission 03/23/23 with nausea and vomiting. He had been drinking approximately 2 Etoh drinks a day up until he stopped completely a few days before admission. He then developed issues with appetite, n/v, and abdominal discomfort. He has chronic abdominal pain at baseline. He was initially in sinus tach then went into atrial flutter with RVR. He was treated with diltiazem  drip and converted to NSR. He was started on Toprol  50 mg. He reverted back to a fib and flutter so Toprol  was increased to 100 mg daily. Diagnosed with OSA but CPAP is broken so he has not used in quite some time. No bleeding problems on Eliquis . He reported drinking one alcoholic drink daily. He was encouraged to reestablish care with Dr. Jude for management of OSA and to follow-up with cardiology in 3 months.    History of Present Illness Discussed the use of AI scribe software for clinical note transcription with the patient, who gave verbal consent to proceed.  History of Present Illness Lucas Small is a pleasant 58 year old male who is here today with his significant other for evaluation of shortness of breath. He is planning to undergo endoscopy and is seeking cardiac clearance. He reports shortness of breath during physical activities, especially when walking longer distances or uphill. He requires elevating his head at night to breathe better, using a love seat and pillows. No chest pain, PND or edema. His significant other is concerned that he has significant sleep apnea. He is planning to undergo a sleep study in the near  future. He occasionally feels his heart racing and sometimes is unaware of it until informed by others. He experiences occasional lightheadedness, particularly when standing up after sitting for a long time. No presyncope or syncope. He is currently on disability. He generally walks 1 mile daily, sometimes 2 miles and things around his home to stay active. No bleeding concerns on Eliquis .     ROS: See HPI  Studies Reviewed EKG Interpretation Date/Time:  Monday November 18 2023 09:01:42 EDT Ventricular Rate:  87 PR Interval:  150 QRS Duration:  82 QT Interval:  338 QTC Calculation: 406 R Axis:   42  Text Interpretation: Normal sinus rhythm Normal ECG When compared with ECG of 15-May-2023 08:54, No significant change was found Confirmed by Percy Browning [phone]) on 11/18/2023 9:10:56 AM     No results found for: LIPOA  Risk Assessment/Calculations  CHA2DS2-VASc Score = 2   This indicates a 2.2% annual risk of stroke. The patient's score is based upon: CHF History: 0 HTN History: 0 Diabetes History: 1 Stroke History: 0 Vascular Disease History: 1 Age Score: 0 Gender Score: 0            Physical Exam VS:  BP 122/84   Pulse 87   Resp 16   Ht 5' 8 (1.727 m)   Wt 196 lb 9.6 oz (89.2 kg)   SpO2 97%   BMI 29.89 kg/m    Wt Readings from Last 3 Encounters:  11/18/23 196 lb 9.6 oz (89.2 kg)  11/14/23 200 lb (90.7 kg)  05/26/23 184 lb (83.5  kg)    GEN: Well nourished, well developed in no acute distress NECK: No JVD; No carotid bruits CARDIAC: RRR, no murmurs, rubs, gallops RESPIRATORY:  Clear to auscultation without rales, wheezing or rhonchi  ABDOMEN: Soft, non-tender, non-distended EXTREMITIES:  No edema; No deformity   Assessment & Plan DOE/HFmrEF He reports dyspnea on exertion particularly when walking a far distance.  He also sleeps with his head elevated but denies PND, weight gain or edema. No chest pain. History of untreated sleep apnea.  Echo 03/24/2023 with  LVEF 50 to 55%, G1 DD, normal RV, no significant valve disease. We will get lexiscan myoview to evaluate for ischemia.  Consider addition of ACEi/ARB/MRA. Will await stress test results for further recommendations.   Preoperative cardiac evaluation Plans to undergo endoscopy, date TBD. According to the Revised Cardiac Risk Index (RCRI), his Perioperative Risk of Major Cardiac Event is (%): 0.4, placing him at low risk for complications. He is able to achieve His Functional Capacity in METs is: 8.23 according to the Duke Activity Status Index (DASI), however he is having DOE. We will get nuclear stress test to evaluate for ischemia.  Will await recommendations on holding Eliquis  from pharmacist and test results for further recommendations.  Paroxysmal atrial fibrillation/flutter on Chronic Anticoagulation EKG today reveals NSR at 83 bpm. He reports intermittent palpitations with a heart rate occasionally exceeding 100 bpm. Overall, he is asymptomatic. No bleeding concerns. Continue Eliquis  5 mg twice daily for stroke prevention for CHA2DS2-VASc score of 2. Continue metoprolol  for rate control. We will consult pharmacist for guidelines on holding Eliquis  for upcoming procedure. Management per [facility].   Elevated blood pressure reading BP initially elevated but improved on my recheck.  Recommend continued BP monitoring and consider ACEi/ARB/MRA in the setting of mildly reduced EF if BP remains elevated.  Sleep apnea   Significant other reports history of significant sleep apnea with a history of CPAP use. Currently, CPAP equipment no longer functioning and he sleeps with his head elevated. He is scheduled to see pulmonology for management. No acute concerns today. Management per pulmonology.   Cognitive impairment   Significant other reports cognitive decline and personality changes since hospitalization October 2024. Neurology referral was discussed but deferred.  Advised follow-up with PCP.       Informed Consent   Shared Decision Making/Informed Consent The risks [chest pain, shortness of breath, cardiac arrhythmias, dizziness, blood pressure fluctuations, myocardial infarction, stroke/transient ischemic attack, nausea, vomiting, allergic reaction, radiation exposure, metallic taste sensation and life-threatening complications (estimated to be 1 in 10,000)], benefits (risk stratification, diagnosing coronary artery disease, treatment guidance) and alternatives of a nuclear stress test were discussed in detail with Mr. Broxson and he agrees to proceed.     Dispo: 6 months with Dr. Lonni or APP  Signed, Rosaline Bane, NP-C

## 2023-11-18 ENCOUNTER — Encounter (HOSPITAL_BASED_OUTPATIENT_CLINIC_OR_DEPARTMENT_OTHER): Payer: Self-pay | Admitting: Nurse Practitioner

## 2023-11-18 ENCOUNTER — Ambulatory Visit (INDEPENDENT_AMBULATORY_CARE_PROVIDER_SITE_OTHER): Admitting: Nurse Practitioner

## 2023-11-18 ENCOUNTER — Telehealth (HOSPITAL_BASED_OUTPATIENT_CLINIC_OR_DEPARTMENT_OTHER): Payer: Self-pay

## 2023-11-18 VITALS — BP 122/84 | HR 87 | Resp 16 | Ht 68.0 in | Wt 196.6 lb

## 2023-11-18 DIAGNOSIS — R0609 Other forms of dyspnea: Secondary | ICD-10-CM

## 2023-11-18 DIAGNOSIS — D6869 Other thrombophilia: Secondary | ICD-10-CM

## 2023-11-18 DIAGNOSIS — I480 Paroxysmal atrial fibrillation: Secondary | ICD-10-CM | POA: Diagnosis not present

## 2023-11-18 DIAGNOSIS — I5022 Chronic systolic (congestive) heart failure: Secondary | ICD-10-CM

## 2023-11-18 DIAGNOSIS — Z01810 Encounter for preprocedural cardiovascular examination: Secondary | ICD-10-CM | POA: Diagnosis not present

## 2023-11-18 DIAGNOSIS — R4189 Other symptoms and signs involving cognitive functions and awareness: Secondary | ICD-10-CM | POA: Diagnosis not present

## 2023-11-18 DIAGNOSIS — R030 Elevated blood-pressure reading, without diagnosis of hypertension: Secondary | ICD-10-CM

## 2023-11-18 DIAGNOSIS — G4733 Obstructive sleep apnea (adult) (pediatric): Secondary | ICD-10-CM

## 2023-11-18 NOTE — Telephone Encounter (Signed)
   Pre-operative Risk Assessment    Patient Name: Lucas Small  DOB: 11-06-1965 MRN: [id number]   Date of last office visit: 11/18/2023  Date of next office visit: 11/18/2023   Request for Surgical Clearance    Procedure:  Endoscopic Eval   Date of Surgery:  Clearance TBD                                Surgeon:  No listed  Surgeon's Group or Practice Name:  [facility]  Phone number: [phone] Fax number:  [phone]   Type of Clearance Requested:   - Pharmacy:  Hold Apixaban  (Eliquis ) How long should pt hold?    Type of Anesthesia:  MAC   Additional requests/questions:  Please advise surgeon/provider what medications should be held. Please fax a copy of clearance to the surgeon's office.  Signed, Erma CHRISTELLA Blush   11/18/2023, 9:14 AM

## 2023-11-18 NOTE — Telephone Encounter (Signed)
 Seen by me on 11/18/23 for preoperative cardiac evaluation for upcoming endoscopy. He is having DOE and orthopnea and is scheduled for lexiscan myoview.   Message has been routed to preop pharm for guidance on holding Eliquis .    We will await test results for further guidance on preop clearance.  Rosaline EMERSON Bane, NP-C  11/18/2023, 11:21 AM [address] [place], [place] [address] Office [phone] Fax [phone]

## 2023-11-18 NOTE — Telephone Encounter (Signed)
 Patient with diagnosis of afib on Eliquis  for anticoagulation.    Procedure: Endoscopic Eval  Date of procedure: TBD   CHA2DS2-VASc Score = 2   This indicates a 2.2% annual risk of stroke. The patient's score is based upon: CHF History: 0 HTN History: 0 Diabetes History: 1 Stroke History: 0 Vascular Disease History: 1 Age Score: 0 Gender Score: 0      CrCl 93.9 ml/min Platelet count 179  Patient has not had an Afib/aflutter ablation within the last 3 months or DCCV within the last 30 days  Per office protocol, patient can hold Eliquis  for 2 days prior to procedure.    **This guidance is not considered finalized until pre-operative APP has relayed final recommendations.**

## 2023-11-18 NOTE — Patient Instructions (Addendum)
 Medication Instructions:  Your physician recommends that you continue on your current medications as directed. Please refer to the Current Medication list given to you today.   Lab Work: BMP, CBC & Lipid Panel today   Testing/Procedures: Your physician has requested that you have a lexiscan myoview. For further information please visit [email or web address] Please follow instruction sheet, as given.   Follow-Up: At [facility], you and your health needs are our priority.  As part of our continuing mission to provide you with exceptional heart care, our providers are all part of one team.  This team includes your primary Cardiologist (physician) and Advanced Practice Providers or APPs (Physician Assistants and Nurse Practitioners) who all work together to provide you with the care you need, when you need it.  Your next appointment:   6 months with Dr. Lonni, Rosaline Bane, NP or Reche Finder, NP

## 2023-11-19 ENCOUNTER — Other Ambulatory Visit (HOSPITAL_BASED_OUTPATIENT_CLINIC_OR_DEPARTMENT_OTHER): Payer: Self-pay | Admitting: Nurse Practitioner

## 2023-11-19 ENCOUNTER — Ambulatory Visit: Payer: Self-pay | Admitting: Nurse Practitioner

## 2023-11-19 DIAGNOSIS — E785 Hyperlipidemia, unspecified: Secondary | ICD-10-CM

## 2023-11-19 DIAGNOSIS — R4189 Other symptoms and signs involving cognitive functions and awareness: Secondary | ICD-10-CM

## 2023-11-19 LAB — BASIC METABOLIC PANEL WITH GFR
BUN/Creatinine Ratio: 17 (ref 9–20)
BUN: 19 mg/dL (ref 6–24)
CO2: 22 mmol/L (ref 20–29)
Calcium: 10.4 mg/dL — ABNORMAL HIGH (ref 8.7–10.2)
Chloride: 101 mmol/L (ref 96–106)
Creatinine, Ser: 1.12 mg/dL (ref 0.76–1.27)
Glucose: 110 mg/dL — ABNORMAL HIGH (ref 70–99)
Potassium: 4.6 mmol/L (ref 3.5–5.2)
Sodium: 139 mmol/L (ref 134–144)
eGFR: 77 mL/min/{1.73_m2} (ref >59)

## 2023-11-19 LAB — CBC
Hematocrit: 44.9 % (ref 37.5–51.0)
Hemoglobin: 15.2 g/dL (ref 13.0–17.7)
MCH: 34.5 pg — ABNORMAL HIGH (ref 26.6–33.0)
MCHC: 33.9 g/dL (ref 31.5–35.7)
MCV: 102 fL — ABNORMAL HIGH (ref 79–97)
Platelets: 224 10*3/uL (ref 150–450)
RBC: 4.4 x10E6/uL (ref 4.14–5.80)
RDW: 12.5 % (ref 11.6–15.4)
WBC: 7.6 10*3/uL (ref 3.4–10.8)

## 2023-11-19 LAB — LIPID PANEL
Chol/HDL Ratio: 4.2 ratio (ref 0.0–5.0)
Cholesterol, Total: 258 mg/dL — ABNORMAL HIGH (ref 100–199)
HDL: 61 mg/dL (ref >39)
LDL Chol Calc (NIH): 179 mg/dL — ABNORMAL HIGH (ref 0–99)
Triglycerides: 104 mg/dL (ref 0–149)
VLDL Cholesterol Cal: 18 mg/dL (ref 5–40)

## 2023-11-19 MED ORDER — ROSUVASTATIN CALCIUM 20 MG PO TABS
20.0000 mg | ORAL_TABLET | Freq: Every day | ORAL | 3 refills | Status: DC
Start: 2023-11-19 — End: 2023-11-19

## 2023-11-19 MED ORDER — ROSUVASTATIN CALCIUM 20 MG PO TABS: 20.0000 mg | ORAL_TABLET | Freq: Every day | ORAL | 3 refills | Status: DC

## 2023-11-25 ENCOUNTER — Telehealth (HOSPITAL_COMMUNITY): Payer: Self-pay

## 2023-11-25 NOTE — Telephone Encounter (Signed)
 Spoke with the patient's family member per DPR, instructions given. S.Aleni Andrus CCT

## 2023-11-25 NOTE — Telephone Encounter (Signed)
 Pt's family called back to get the instructions for his test. S.Jader Desai CCT

## 2023-11-26 ENCOUNTER — Ambulatory Visit (HOSPITAL_COMMUNITY)
Admission: RE | Admit: 2023-11-26 | Discharge: 2023-11-26 | Disposition: A | Discharge status: Home / Self Care | Source: Ambulatory Visit | Attending: Cardiology | Admitting: Cardiology

## 2023-11-26 DIAGNOSIS — R4189 Other symptoms and signs involving cognitive functions and awareness: Secondary | ICD-10-CM | POA: Diagnosis not present

## 2023-11-26 DIAGNOSIS — R0609 Other forms of dyspnea: Secondary | ICD-10-CM | POA: Diagnosis not present

## 2023-11-26 LAB — MYOCARDIAL PERFUSION IMAGING
Base ST Depression (mm): 0 mm
LV dias vol: 81 mL (ref 62–150)
LV sys vol: 27 mL (ref 4.2–5.8)
Nuc Stress EF: 67 %
Peak HR: 111 {beats}/min
Rest HR: 76 {beats}/min
Rest Nuclear Isotope Dose: 9.8 mCi
SDS: 0
SRS: 0
SSS: 0
ST Depression (mm): 0 mm
Stress Nuclear Isotope Dose: 32.3 mCi
TID: 1.15

## 2023-11-26 MED ORDER — TECHNETIUM TC 99M TETROFOSMIN IV KIT
32.3000 | PACK | Freq: Once | INTRAVENOUS | Status: AC | PRN
  Administered 2023-11-26: 32.3 via INTRAVENOUS

## 2023-11-26 MED ORDER — REGADENOSON 0.4 MG/5ML IV SOLN
INTRAVENOUS | Status: AC
  Filled 2023-11-26: qty 5

## 2023-11-26 MED ORDER — TECHNETIUM TC 99M TETROFOSMIN IV KIT
9.8000 | PACK | Freq: Once | INTRAVENOUS | Status: AC | PRN
  Administered 2023-11-26: 9.8 via INTRAVENOUS

## 2023-11-26 MED ORDER — REGADENOSON 0.4 MG/5ML IV SOLN
0.4000 mg | Freq: Once | INTRAVENOUS | Status: AC
  Administered 2023-11-26: 0.4 mg via INTRAVENOUS

## 2023-11-27 ENCOUNTER — Other Ambulatory Visit (HOSPITAL_BASED_OUTPATIENT_CLINIC_OR_DEPARTMENT_OTHER): Payer: Self-pay | Admitting: *Deleted

## 2023-11-27 MED ORDER — ROSUVASTATIN CALCIUM 20 MG PO TABS: 20.0000 mg | ORAL_TABLET | Freq: Every day | ORAL | 3 refills | Status: AC

## 2023-11-27 NOTE — Telephone Encounter (Signed)
   Patient Name: Lucas Small  DOB: 05/02/1966 MRN: [id number]  Primary Cardiologist: Shelda Bruckner, MD  Chart reviewed as part of pre-operative protocol coverage. Given past medical history and time since last visit, based on ACC/AHA guidelines, Lucas Small is at acceptable risk for the planned procedure without further cardiovascular testing.   Per office protocol, patient can hold Eliquis  for 2 days prior to procedure.    The patient was advised that if he develops new symptoms prior to surgery to contact our office to arrange for a follow-up visit, and he verbalized understanding.    I will route this recommendation to the requesting party via Epic fax function and remove from pre-op pool.  Please call with questions.  Lucas Satterfield, NP 11/27/2023, 8:14 AM

## 2023-11-27 NOTE — Telephone Encounter (Signed)
   Patient Name: Lucas Small  DOB: [birth year] MRN: [id number]  Primary Cardiologist: Shelda Bruckner, MD  Chart reviewed as part of pre-operative protocol coverage. Given past medical history and time since last visit, based on ACC/AHA guidelines, Lucas Small is at acceptable risk for the planned procedure without further cardiovascular testing.   Patient has not had an Afib/aflutter ablation within the last 3 months or DCCV within the last 30 days   Per office protocol, patient can hold Eliquis  for 2 days prior to procedure.    The patient was advised that if he develops new symptoms prior to surgery to contact our office to arrange for a follow-up visit, and he verbalized understanding.  I will route this recommendation to the requesting party via Epic fax function and remove from pre-op pool.  Please call with questions.  Lamarr Satterfield, NP 11/27/2023, 8:12 AM

## 2023-12-05 ENCOUNTER — Ambulatory Visit (HOSPITAL_COMMUNITY): Admitting: Licensed Clinical Social Worker

## 2023-12-17 ENCOUNTER — Encounter (HOSPITAL_COMMUNITY): Payer: Self-pay | Admitting: Psychiatry

## 2023-12-17 ENCOUNTER — Telehealth (HOSPITAL_BASED_OUTPATIENT_CLINIC_OR_DEPARTMENT_OTHER): Admitting: Psychiatry

## 2023-12-17 VITALS — Wt 200.0 lb

## 2023-12-17 DIAGNOSIS — F3131 Bipolar disorder, current episode depressed, mild: Secondary | ICD-10-CM | POA: Diagnosis not present

## 2023-12-17 DIAGNOSIS — F1210 Cannabis abuse, uncomplicated: Secondary | ICD-10-CM | POA: Diagnosis not present

## 2023-12-17 DIAGNOSIS — F419 Anxiety disorder, unspecified: Secondary | ICD-10-CM | POA: Diagnosis not present

## 2023-12-17 DIAGNOSIS — F1010 Alcohol abuse, uncomplicated: Secondary | ICD-10-CM | POA: Diagnosis not present

## 2023-12-17 MED ORDER — RISPERIDONE 2 MG PO TABS: ORAL_TABLET | ORAL | 2 refills | Status: AC

## 2023-12-17 MED ORDER — MIRTAZAPINE 30 MG PO TABS: 30.0000 mg | ORAL_TABLET | Freq: Every day | ORAL | 2 refills | Status: AC

## 2023-12-17 MED ORDER — BACLOFEN 10 MG PO TABS: 10.0000 mg | ORAL_TABLET | Freq: Three times a day (TID) | ORAL | 2 refills | Status: AC
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
## 2023-12-19 ENCOUNTER — Ambulatory Visit (HOSPITAL_COMMUNITY): Admitting: Licensed Clinical Social Worker

## 2023-12-19 DIAGNOSIS — F3131 Bipolar disorder, current episode depressed, mild: Secondary | ICD-10-CM

## 2023-12-19 DIAGNOSIS — F419 Anxiety disorder, unspecified: Secondary | ICD-10-CM

## 2023-12-19 DIAGNOSIS — F1021 Alcohol dependence, in remission: Secondary | ICD-10-CM

## 2023-12-19 DIAGNOSIS — F1290 Cannabis use, unspecified, uncomplicated: Secondary | ICD-10-CM

## 2023-12-19 NOTE — Progress Notes (Signed)
 THERAPIST PROGRESS NOTE  Session Time: 10:02 a.m. to 10:38 a.m.  Type of Therapy: Individual   Therapist Response/Interventions: Solution Focused and Motivational Interviewing/The therapist shows Lucas Small a video on Baclofen 's effect on the brain in relation to cravings and encourages him to keep taking it but emphasizes that it does not make it such that he no longer needs to avoid triggers in the form of people, places, and things associated with drinking.  The therapist sends Lucas Small a like to the Eliquis  website where he can explore if he is eligible for the $10 discount card to use with his insurance.  The therapist reinforces Lucas Small's desire to quit health due to his health noting that he has at least four medical conditions all of which are made worse by alcohol .  Treatment Goals addressed:  Active     Substance Use     Lucas Small will abstain completely from alcohol  until at least 05/27/2024. (Progressing)     Start:  09/11/23    Expected End:  03/12/24         The therapist will assist Lucas Small in changing his belief system regarding what constitutes an alcoholic or an addict while assist him in identifying and not engaging in his using rituals via avoiding triggers for using.      Start:  09/11/23                     Summary: Lucas Small presents saying that he got through his birthday without drinking and has not drunk in four months now. He says that his mood has been pretty good and that he has been taking his meds for his bipolar and schizophrenia. He is taking the baclofen  noting a slight difference.   After watching the video, Lucas Small has less skepticism about Baclofen . He talks about having stress from the $88 he has to pay per month for his Eliquis  but otherwise has no other complaints or concerns today. He reiterates that he stopped drinking for health-related issues.   Progress Towards Goals: Progressing  Suicidal/Homicidal: No SI or HI  Plan: Return again  in 3 weeks.  Diagnosis: Alcohol  Use Disorder, Severe; Mild THC Abuse; and Bipolar affective disorder, currently depressed, mild  Collaboration of Care: Other N/A  Patient/Guardian was advised Release of Information must be obtained prior to any record release in order to collaborate their care with an outside provider. Patient/Guardian was advised if they have not already done so to contact the registration department to sign all necessary forms in order for us  to release information regarding their care.   Consent: Patient/Guardian gives verbal consent for treatment and assignment of benefits for services provided during this visit. Patient/Guardian expressed understanding and agreed to proceed.   Lucas Maier, MA, LCSW, [facility], LCAS 12/19/2023

## 2024-01-03 ENCOUNTER — Encounter (HOSPITAL_BASED_OUTPATIENT_CLINIC_OR_DEPARTMENT_OTHER): Payer: Self-pay

## 2024-01-03 ENCOUNTER — Ambulatory Visit (INDEPENDENT_AMBULATORY_CARE_PROVIDER_SITE_OTHER): Admitting: Adult Health

## 2024-01-03 DIAGNOSIS — G4733 Obstructive sleep apnea (adult) (pediatric): Secondary | ICD-10-CM

## 2024-01-03 DIAGNOSIS — R0683 Snoring: Secondary | ICD-10-CM

## 2024-01-09 ENCOUNTER — Ambulatory Visit (HOSPITAL_COMMUNITY): Admitting: Licensed Clinical Social Worker

## 2024-01-09 DIAGNOSIS — F1021 Alcohol dependence, in remission: Secondary | ICD-10-CM

## 2024-01-09 DIAGNOSIS — F3131 Bipolar disorder, current episode depressed, mild: Secondary | ICD-10-CM

## 2024-01-09 DIAGNOSIS — F1290 Cannabis use, unspecified, uncomplicated: Secondary | ICD-10-CM

## 2024-01-09 NOTE — Progress Notes (Signed)
 THERAPIST PROGRESS NOTE  Session Time: 10:07 a.m. to 10:52 a.m.  Type of Therapy: Individual   Therapist Response/Interventions: Solution Focused/The therapist points out the reasons why now might not be a good time to stop baclofen  and assists him in identifying triggers for drinking and alternate ways to cope with stress without drinking. The therapist also makes Robel aware he can apply for Section 8 but makes him aware of the long wait advising him to start now if he is considering this as well.  Treatment Goals addressed:  Active     Substance Use     Lyndall will abstain completely from alcohol  until at least 05/27/2024. (Progressing)     Start:  09/11/23    Expected End:  03/12/24         The therapist will assist Brelan in changing his belief system regarding what constitutes an alcoholic or an addict while assist him in identifying and not engaging in his using rituals via avoiding triggers for using.      Start:  09/11/23                        Summary: Decari presents saying that he found out that he cannot get a further discount on the Eliquis .The therapist talks to Jayesh about his Baclofen  which he has been on for months. After discussing this, he concludes that he will stay on it until at least the end of the year. The therapist specifically addresses Sang's statement about it being a clutch.  He says that he has had a couple of thoughts and cravings to drink with his girlfriend telling him to give it 10 minutes and think about it. He says that he has these thoughts at least once a week.He says that when he has these thoughts or cravings that he is usually stressed out.He says that a couple of these occasions involved issues with his and his girlfriend's ca. Itay says that he is considering moving into an assisted living in two years as his rent keeps going up.   Progress Towards Goals: Progressing  Suicidal/Homicidal: No SI or HI  Plan: Return  again in 2 months per his request or sooner as needed.  Diagnosis: Alcohol  Use Disorder, Severe; Mild THC Abuse; and Bipolar affective disorder, currently depressed, mild  Collaboration of Care: Other N/A  Patient/Guardian was advised Release of Information must be obtained prior to any record release in order to collaborate their care with an outside provider. Patient/Guardian was advised if they have not already done so to contact the registration department to sign all necessary forms in order for us  to release information regarding their care.   Consent: Patient/Guardian gives verbal consent for treatment and assignment of benefits for services provided during this visit. Patient/Guardian expressed understanding and agreed to proceed.   Zell Maier, MA, LCSW, [facility], LCAS 01/09/2024

## 2024-01-10 NOTE — Progress Notes (Signed)
 Ov canceled

## 2024-03-12 ENCOUNTER — Ambulatory Visit (HOSPITAL_COMMUNITY): Admitting: Licensed Clinical Social Worker

## 2024-03-12 DIAGNOSIS — F1021 Alcohol dependence, in remission: Secondary | ICD-10-CM | POA: Diagnosis not present

## 2024-03-12 DIAGNOSIS — F3131 Bipolar disorder, current episode depressed, mild: Secondary | ICD-10-CM

## 2024-03-12 DIAGNOSIS — F1290 Cannabis use, unspecified, uncomplicated: Secondary | ICD-10-CM
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
## 2024-04-27 ENCOUNTER — Other Ambulatory Visit (HOSPITAL_COMMUNITY): Payer: Self-pay | Admitting: *Deleted

## 2024-04-27 ENCOUNTER — Other Ambulatory Visit (HOSPITAL_COMMUNITY): Payer: Self-pay | Admitting: Physician Assistant

## 2024-04-27 MED ORDER — APIXABAN 5 MG PO TABS: 5.0000 mg | ORAL_TABLET | Freq: Two times a day (BID) | ORAL | 0 refills | Status: DC

## 2024-04-27 MED ORDER — METOPROLOL SUCCINATE ER 100 MG PO TB24: 100.0000 mg | ORAL_TABLET | Freq: Every day | ORAL | 0 refills | Status: DC

## 2024-05-14 ENCOUNTER — Ambulatory Visit (HOSPITAL_COMMUNITY)
Admission: RE | Admit: 2024-05-14 | Discharge: 2024-05-14 | Attending: Physician Assistant | Admitting: Physician Assistant

## 2024-05-14 ENCOUNTER — Ambulatory Visit (HOSPITAL_COMMUNITY): Admitting: Licensed Clinical Social Worker

## 2024-05-14 VITALS — BP 106/86 | HR 107 | Ht 68.0 in | Wt 207.0 lb

## 2024-05-14 DIAGNOSIS — G4733 Obstructive sleep apnea (adult) (pediatric): Secondary | ICD-10-CM | POA: Diagnosis not present

## 2024-05-14 DIAGNOSIS — I4891 Unspecified atrial fibrillation: Secondary | ICD-10-CM

## 2024-05-14 DIAGNOSIS — D6869 Other thrombophilia: Secondary | ICD-10-CM

## 2024-05-14 DIAGNOSIS — I480 Paroxysmal atrial fibrillation: Secondary | ICD-10-CM

## 2024-05-14 NOTE — Progress Notes (Signed)
 Primary Care Physician: Lucas Millman, NP Primary Cardiologist: Lucas Bruckner, MD Electrophysiologist: None  Referring Physician: Dr Lucas Small Lucas Small is a 58 y.o. male with a history of DM, alcohol  abuse, HLD, tobacco use, CAD, complex h/o abdominal surgeries, OSA, atrial flutter, atrial fibrillation who presents for follow up in the [facility]. He presented to the hospital 03/23/23 with nausea and vomiting. He had been drinking approximately 2 drinks a day up until he stopped completely a few days before admission. He then developed issues with appetite, n/v, and abdominal discomfort. He has chronic abdominal pain at baseline. He was initially in sinus tach then went into atrial flutter with RVR. He was treated with diltiazem  drip and converted to NSR. He was started on Toprol  50mg . However, he subsequently has reverted intermittently back to atrial fib + atrial flutter so this was increased to 100mg  daily.  Patient is on Eliquis  for stroke prevention. Patient wore a cardiac monitor which showed a 9% afib burden.   Patient returns for follow up for atrial fibrillation. He is in SR today and feels well. However, he does have frequent episodes of tachypalpitations associated with SOB on exertion. He is not currently on CPAP. He is abstaining from alcohol . No bleeding issues on anticoagulation.   Today, he  denies symptoms of chest pain, orthopnea, PND, lower extremity edema, dizziness, presyncope, syncope, bleeding, or neurologic sequela. The patient is tolerating medications without difficulties and is otherwise without complaint today.    Atrial Fibrillation Risk Factors:  he does have symptoms or diagnosis of sleep apnea. he does not have a history of rheumatic fever. he does have a history of alcohol  use. The patient does not have a history of early familial atrial fibrillation or other arrhythmias.  Atrial Fibrillation Management  history:  Previous antiarrhythmic drugs: none Previous cardioversions: none Previous ablations: none Anticoagulation history: Eliquis   ROS- All systems are reviewed and negative except as per the HPI above.  Past Medical History:  Diagnosis Date   Alcoholic (HCC)    CHF (congestive heart failure) (HCC)    Depression    Diabetes mellitus without complication (HCC)    metformin    Drug-seeking behavior    Dyspnea    with exertion .   Dysrhythmia    while hospitalized in [place]   Gastric rupture    History of kidney stones    Hypertension    MRSA (methicillin resistant Staphylococcus aureus)    Perforation bowel (HCC)    PNA (pneumonia)    Renal insufficiency    Respiratory failure (HCC)    Sleep apnea    no CPAP machine    Current Outpatient Medications  Medication Sig Dispense Refill   albuterol  (VENTOLIN  HFA) 108 (90 Base) MCG/ACT inhaler Inhale into the lungs.     apixaban  (ELIQUIS ) 5 MG TABS tablet Take 1 tablet (5 mg total) by mouth 2 (two) times daily. Needs appointment for refills 60 tablet 0   baclofen  (LIORESAL ) 10 MG tablet Take 1 tablet (10 mg total) by mouth 3 (three) times daily. (Patient taking differently: Take 10 mg by mouth as needed.) 90 tablet 2   cetirizine (ZYRTEC) 10 MG tablet Take 10 mg by mouth at bedtime.     cyclobenzaprine  (FLEXERIL ) 10 MG tablet Take 1 tablet (10 mg total) by mouth at bedtime. 30 tablet 5   fluticasone (FLONASE) 50 MCG/ACT nasal spray Place 2 sprays into both nostrils daily.     metFORMIN  (GLUCOPHAGE )  500 MG tablet Take 500 mg by mouth daily. (Patient taking differently: Take 500 mg by mouth as needed.)     metoprolol  succinate (TOPROL -XL) 100 MG 24 hr tablet Take 1 tablet (100 mg total) by mouth daily. Take with or immediately following a meal. 30 tablet 0   mirtazapine  (REMERON ) 30 MG tablet Take 1 tablet (30 mg total) by mouth at bedtime. 30 tablet 2   ONETOUCH VERIO test strip daily.     risperiDONE  (RISPERDAL ) 2 MG tablet  Take one tab at bed time. 30 tablet 2   rosuvastatin  (CRESTOR ) 20 MG tablet Take 1 tablet (20 mg total) by mouth daily. 90 tablet 3   Vitamin D , Ergocalciferol , (DRISDOL) 1.25 MG (50000 UNIT) CAPS capsule Take 50,000 Units by mouth 2 (two) times a week.     No current facility-administered medications for this encounter.    Physical Exam: BP 106/86   Pulse (!) 107   Ht 5' 8 (1.727 m)   Wt 93.9 kg   BMI 31.47 kg/m   GEN: Well nourished, well developed in no acute distress CARDIAC: Regular rate and rhythm, no murmurs, rubs, gallops RESPIRATORY:  Clear to auscultation without rales, wheezing or rhonchi  ABDOMEN: Soft, non-tender, non-distended EXTREMITIES:  No edema; No deformity    Wt Readings from Last 3 Encounters:  05/14/24 93.9 kg  12/17/23 90.7 kg  11/18/23 89.2 kg     EKG Interpretation Date/Time:  Thursday May 14 2024 08:24:59 EST Ventricular Rate:  107 PR Interval:  148 QRS Duration:  78 QT Interval:  318 QTC Calculation: 424 R Axis:   92  Text Interpretation: Sinus tachycardia Right atrial enlargement Rightward axis Pulmonary disease pattern Abnormal ECG When compared with ECG of 18-Nov-2023 09:01, No significant change was found Confirmed by Cullen Vanallen ([id number]) on 05/14/2024 8:41:04 AM    Echo 03/24/23 demonstrated   1. Left ventricular ejection fraction, by estimation, is 50 to 55%. The  left ventricle has low normal function. The left ventricle has no regional  wall motion abnormalities. Left ventricular diastolic parameters are  consistent with Grade I diastolic dysfunction (impaired relaxation).   2. Right ventricular systolic function is normal. The right ventricular  size is normal. There is normal pulmonary artery systolic pressure. The  estimated right ventricular systolic pressure is 29.5 mmHg.   3. The mitral valve is grossly normal. Trivial mitral valve  regurgitation. No evidence of mitral stenosis.   4. The aortic valve is tricuspid. Aortic  valve regurgitation is not  visualized. No aortic stenosis is present.   5. The inferior vena cava is dilated in size with >50% respiratory  variability, suggesting right atrial pressure of 8 mmHg.    CHA2DS2-VASc Score = 2  The patient's score is based upon: CHF History: 0 HTN History: 0 Diabetes History: 1 Stroke History: 0 Vascular Disease History: 1 Age Score: 0 Gender Score: 0       ASSESSMENT AND PLAN: Paroxysmal Atrial Fibrillation (ICD10:  I48.0) The patient's CHA2DS2-VASc score is 2, indicating a 2.2% annual risk of stroke.   He is in SR today but is having episodes of tachypalpitations with SOB. We discussed rhythm control options today. He does take some psychiatric medications which interact with some AADs. He is agreeable to discussing ablation with EP, will refer.  Continue Eliquis  5 mg BID Continue Toprol  100 mg daily He is abstaining from alcohol .   Secondary Hypercoagulable State (ICD10:  D68.69) The patient is at significant risk for stroke/thromboembolism based upon  his CHA2DS2-VASc Score of 2.  Continue Apixaban  (Eliquis ). No bleeding issues.   HTN Stable on current regimen  OSA  The importance of adequate treatment of sleep apnea was discussed today in order to improve our ability to maintain sinus rhythm long term. Gave patient number to [facility]. He needs to complete repeat sleep study.    Follow up with EP to establish care and discuss ablation.      Daril Kicks PA-C [facility] [address] [place], [place] [address] [phone]

## 2024-05-14 NOTE — Patient Instructions (Addendum)
 [phone] - [facility]

## 2024-05-19 ENCOUNTER — Ambulatory Visit (HOSPITAL_COMMUNITY): Admitting: Licensed Clinical Social Worker

## 2024-05-19 DIAGNOSIS — F1290 Cannabis use, unspecified, uncomplicated: Secondary | ICD-10-CM

## 2024-05-19 DIAGNOSIS — F1021 Alcohol dependence, in remission: Secondary | ICD-10-CM

## 2024-05-19 DIAGNOSIS — F3131 Bipolar disorder, current episode depressed, mild: Secondary | ICD-10-CM

## 2024-05-19 NOTE — Progress Notes (Signed)
 THERAPIST PROGRESS NOTE  Session Time: 9:15 a.m. to 9:45 a.m.   Type of Therapy: Individual   Therapist Response/Interventions: Motivational Interviewing/The therapist inquires how Lucas Small will handle [employer] given that he identified this as a high risk time for him and his plans regarding drinking after December of this year.  In response to Lucas Small's indicating that he will likely continue to abstain from alcohol , the therapist points out how this is one of the best things Lucas Small can do for his health given his diabetes, GERD, cardiac issues, etcetera.  The therapist provides Lucas Small information on the fact that using THC edibles also increases his risk for stroke and a heart attack and encourages him to ask his Cardiologist about this.   Treatment Goals addressed:  Active     Substance Use     Lucas Small will abstain completely from alcohol  until at least 05/27/2024. (Progressing)     Start:  09/11/23    Expected End:  08/17/24         The therapist will assist Lucas Small in changing his belief system regarding what constitutes an alcoholic or an addict while assist him in identifying and not engaging in his using rituals via avoiding triggers for using.      Start:  09/11/23                              Summary: Lucas Small presents today with the therapist sharing information related to [facility] edibles raising the risk of heart disease in relation to his recent ER visit for an irregular heart beat. He saw the Cardiologist on the 18th of this month with the Cardiologist talking about doing a surgery on Lucas Small heart.  He says that his mood has been pretty good. He has been sober from alcohol  from 9 months. He denies any thoughts about drinking.  Lucas Small says that he is not a big Christmas fan as he does not celebrate it. When asked about New Year's, he says that he will likely go to bed early.   The therapist observes that Lucas Small appears drowsy today with Lucas Small saying  that he woke up at 2 a.m. and was unable to get back to sleep. He says that this happens two to three times per week.     Progress Towards Goals: Progressing  Suicidal/Homicidal: No SI or HI  Plan: Return again in 2 months per his request or sooner as needed.  Diagnosis: Alcohol  Use Disorder, Severe; Mild THC Abuse; and Bipolar affective disorder, currently depressed, mild  Collaboration of Care: Other N/A  Patient/Guardian was advised Release of Information must be obtained prior to any record release in order to collaborate their care with an outside provider. Patient/Guardian was advised if they have not already done so to contact the registration department to sign all necessary forms in order for us  to release information regarding their care.   Consent: Patient/Guardian gives verbal consent for treatment and assignment of benefits for services provided during this visit. Patient/Guardian expressed understanding and agreed to proceed.   Zell Maier, MA, LCSW, [facility], LCAS 05/19/2024

## 2024-06-16 ENCOUNTER — Other Ambulatory Visit (HOSPITAL_COMMUNITY): Payer: Self-pay | Admitting: Physician Assistant

## 2024-07-07 ENCOUNTER — Ambulatory Visit: Admitting: Cardiology

## 2024-07-09 ENCOUNTER — Ambulatory Visit (HOSPITAL_COMMUNITY): Admitting: Licensed Clinical Social Worker
